# Patient Record
Sex: Female | Born: 1975
Health system: Southern US, Community
[De-identification: ages and names within clinical notes are randomized; demographics above are authoritative.]

## PROBLEM LIST (undated history)

## (undated) DIAGNOSIS — I739 Peripheral vascular disease, unspecified: Secondary | ICD-10-CM

## (undated) DIAGNOSIS — E119 Type 2 diabetes mellitus without complications: Secondary | ICD-10-CM

## (undated) DIAGNOSIS — F32A Depression, unspecified: Secondary | ICD-10-CM

## (undated) DIAGNOSIS — G5600 Carpal tunnel syndrome, unspecified upper limb: Secondary | ICD-10-CM

## (undated) DIAGNOSIS — D649 Anemia, unspecified: Secondary | ICD-10-CM

## (undated) DIAGNOSIS — T7840XA Allergy, unspecified, initial encounter: Secondary | ICD-10-CM

## (undated) DIAGNOSIS — Z8489 Family history of other specified conditions: Secondary | ICD-10-CM

## (undated) DIAGNOSIS — F329 Major depressive disorder, single episode, unspecified: Secondary | ICD-10-CM

## (undated) DIAGNOSIS — M1990 Unspecified osteoarthritis, unspecified site: Secondary | ICD-10-CM

## (undated) DIAGNOSIS — F319 Bipolar disorder, unspecified: Secondary | ICD-10-CM

## (undated) DIAGNOSIS — R911 Solitary pulmonary nodule: Secondary | ICD-10-CM

## (undated) DIAGNOSIS — G4730 Sleep apnea, unspecified: Secondary | ICD-10-CM

## (undated) DIAGNOSIS — R5383 Other fatigue: Secondary | ICD-10-CM

## (undated) HISTORY — DX: Major depressive disorder, single episode, unspecified: F32.9

## (undated) HISTORY — PX: COLONOSCOPY: SHX174

## (undated) HISTORY — DX: Other fatigue: R53.83

## (undated) HISTORY — DX: Unspecified osteoarthritis, unspecified site: M19.90

## (undated) HISTORY — DX: Depression, unspecified: F32.A

## (undated) HISTORY — PX: TUBAL LIGATION: SHX77

## (undated) HISTORY — DX: Solitary pulmonary nodule: R91.1

## (undated) HISTORY — DX: Bipolar disorder, unspecified: F31.9

## (undated) HISTORY — PX: ABDOMINAL HYSTERECTOMY: SHX81

## (undated) HISTORY — DX: Carpal tunnel syndrome, unspecified upper limb: G56.00

## (undated) HISTORY — DX: Allergy, unspecified, initial encounter: T78.40XA

---

## 1997-08-03 ENCOUNTER — Encounter: Admission: RE | Admit: 1997-08-03 | Discharge: 1997-08-03 | Payer: Self-pay | Admitting: Family Medicine

## 1997-10-19 ENCOUNTER — Other Ambulatory Visit: Admission: RE | Admit: 1997-10-19 | Discharge: 1997-10-19 | Payer: Self-pay | Admitting: Obstetrics and Gynecology

## 1997-10-20 ENCOUNTER — Ambulatory Visit (HOSPITAL_COMMUNITY): Admission: RE | Admit: 1997-10-20 | Discharge: 1997-10-20 | Payer: Self-pay | Admitting: Obstetrics and Gynecology

## 1997-10-22 ENCOUNTER — Ambulatory Visit (HOSPITAL_COMMUNITY): Admission: RE | Admit: 1997-10-22 | Discharge: 1997-10-22 | Payer: Self-pay | Admitting: Obstetrics and Gynecology

## 1997-11-11 ENCOUNTER — Inpatient Hospital Stay (HOSPITAL_COMMUNITY): Admission: AD | Admit: 1997-11-11 | Discharge: 1997-11-11 | Payer: Self-pay | Admitting: Obstetrics & Gynecology

## 1998-05-18 ENCOUNTER — Inpatient Hospital Stay (HOSPITAL_COMMUNITY): Admission: AD | Admit: 1998-05-18 | Discharge: 1998-05-18 | Payer: Self-pay | Admitting: Obstetrics and Gynecology

## 1998-05-28 ENCOUNTER — Inpatient Hospital Stay (HOSPITAL_COMMUNITY): Admission: AD | Admit: 1998-05-28 | Discharge: 1998-05-28 | Payer: Self-pay | Admitting: Obstetrics and Gynecology

## 1998-05-28 ENCOUNTER — Inpatient Hospital Stay (HOSPITAL_COMMUNITY): Admission: AD | Admit: 1998-05-28 | Discharge: 1998-05-28 | Payer: Self-pay | Admitting: Obstetrics & Gynecology

## 1998-05-29 ENCOUNTER — Inpatient Hospital Stay (HOSPITAL_COMMUNITY): Admission: AD | Admit: 1998-05-29 | Discharge: 1998-05-29 | Payer: Self-pay | Admitting: Obstetrics and Gynecology

## 1998-06-03 ENCOUNTER — Inpatient Hospital Stay (HOSPITAL_COMMUNITY): Admission: AD | Admit: 1998-06-03 | Discharge: 1998-06-05 | Payer: Self-pay | Admitting: Obstetrics & Gynecology

## 1998-10-11 ENCOUNTER — Encounter: Admission: RE | Admit: 1998-10-11 | Discharge: 1998-10-11 | Payer: Self-pay | Admitting: Family Medicine

## 1998-10-29 ENCOUNTER — Encounter: Admission: RE | Admit: 1998-10-29 | Discharge: 1998-10-29 | Payer: Self-pay | Admitting: Family Medicine

## 1998-11-16 ENCOUNTER — Encounter: Admission: RE | Admit: 1998-11-16 | Discharge: 1998-11-16 | Payer: Self-pay | Admitting: Family Medicine

## 1998-12-30 ENCOUNTER — Encounter: Admission: RE | Admit: 1998-12-30 | Discharge: 1998-12-30 | Payer: Self-pay | Admitting: Family Medicine

## 1999-01-04 ENCOUNTER — Encounter: Admission: RE | Admit: 1999-01-04 | Discharge: 1999-01-04 | Payer: Self-pay | Admitting: Sports Medicine

## 1999-01-10 ENCOUNTER — Encounter: Admission: RE | Admit: 1999-01-10 | Discharge: 1999-01-10 | Payer: Self-pay | Admitting: Sports Medicine

## 1999-01-10 ENCOUNTER — Encounter: Payer: Self-pay | Admitting: Sports Medicine

## 1999-01-21 ENCOUNTER — Encounter: Admission: RE | Admit: 1999-01-21 | Discharge: 1999-01-21 | Payer: Self-pay | Admitting: Family Medicine

## 1999-02-21 ENCOUNTER — Encounter: Admission: RE | Admit: 1999-02-21 | Discharge: 1999-02-21 | Payer: Self-pay | Admitting: Sports Medicine

## 1999-03-14 ENCOUNTER — Emergency Department (HOSPITAL_COMMUNITY): Admission: EM | Admit: 1999-03-14 | Discharge: 1999-03-14 | Payer: Self-pay | Admitting: Emergency Medicine

## 1999-04-29 ENCOUNTER — Encounter: Admission: RE | Admit: 1999-04-29 | Discharge: 1999-04-29 | Payer: Self-pay | Admitting: Family Medicine

## 1999-05-13 ENCOUNTER — Encounter: Admission: RE | Admit: 1999-05-13 | Discharge: 1999-05-13 | Payer: Self-pay | Admitting: Family Medicine

## 1999-06-13 ENCOUNTER — Emergency Department (HOSPITAL_COMMUNITY): Admission: EM | Admit: 1999-06-13 | Discharge: 1999-06-13 | Payer: Self-pay | Admitting: *Deleted

## 1999-06-14 ENCOUNTER — Encounter: Admission: RE | Admit: 1999-06-14 | Discharge: 1999-06-14 | Payer: Self-pay | Admitting: Family Medicine

## 1999-06-15 ENCOUNTER — Encounter: Payer: Self-pay | Admitting: Emergency Medicine

## 1999-06-15 ENCOUNTER — Inpatient Hospital Stay (HOSPITAL_COMMUNITY): Admission: EM | Admit: 1999-06-15 | Discharge: 1999-06-17 | Payer: Self-pay | Admitting: Emergency Medicine

## 1999-06-15 ENCOUNTER — Encounter: Payer: Self-pay | Admitting: Gastroenterology

## 1999-06-20 ENCOUNTER — Encounter: Admission: RE | Admit: 1999-06-20 | Discharge: 1999-06-20 | Payer: Self-pay | Admitting: Pediatrics

## 1999-07-18 ENCOUNTER — Encounter: Admission: RE | Admit: 1999-07-18 | Discharge: 1999-07-18 | Payer: Self-pay | Admitting: Family Medicine

## 1999-07-20 ENCOUNTER — Ambulatory Visit (HOSPITAL_COMMUNITY): Admission: RE | Admit: 1999-07-20 | Discharge: 1999-07-20 | Payer: Self-pay | Admitting: *Deleted

## 1999-07-22 ENCOUNTER — Encounter: Admission: RE | Admit: 1999-07-22 | Discharge: 1999-07-22 | Payer: Self-pay | Admitting: Family Medicine

## 1999-08-05 ENCOUNTER — Encounter: Payer: Self-pay | Admitting: Obstetrics and Gynecology

## 1999-08-05 ENCOUNTER — Ambulatory Visit (HOSPITAL_COMMUNITY): Admission: RE | Admit: 1999-08-05 | Discharge: 1999-08-05 | Payer: Self-pay | Admitting: Obstetrics and Gynecology

## 1999-08-26 ENCOUNTER — Inpatient Hospital Stay (HOSPITAL_COMMUNITY): Admission: AD | Admit: 1999-08-26 | Discharge: 1999-08-26 | Payer: Self-pay | Admitting: Obstetrics & Gynecology

## 1999-10-20 ENCOUNTER — Ambulatory Visit (HOSPITAL_COMMUNITY): Admission: RE | Admit: 1999-10-20 | Discharge: 1999-10-20 | Payer: Self-pay | Admitting: Obstetrics & Gynecology

## 1999-10-20 ENCOUNTER — Encounter: Payer: Self-pay | Admitting: Obstetrics & Gynecology

## 1999-11-09 ENCOUNTER — Emergency Department (HOSPITAL_COMMUNITY): Admission: EM | Admit: 1999-11-09 | Discharge: 1999-11-09 | Payer: Self-pay | Admitting: Emergency Medicine

## 1999-11-11 ENCOUNTER — Inpatient Hospital Stay (HOSPITAL_COMMUNITY): Admission: AD | Admit: 1999-11-11 | Discharge: 1999-11-11 | Payer: Self-pay | Admitting: Obstetrics & Gynecology

## 1999-12-22 ENCOUNTER — Inpatient Hospital Stay (HOSPITAL_COMMUNITY): Admission: AD | Admit: 1999-12-22 | Discharge: 1999-12-22 | Payer: Self-pay | Admitting: Obstetrics & Gynecology

## 1999-12-29 ENCOUNTER — Inpatient Hospital Stay (HOSPITAL_COMMUNITY): Admission: AD | Admit: 1999-12-29 | Discharge: 1999-12-29 | Payer: Self-pay | Admitting: *Deleted

## 2000-01-11 ENCOUNTER — Ambulatory Visit (HOSPITAL_COMMUNITY): Admission: RE | Admit: 2000-01-11 | Discharge: 2000-01-11 | Payer: Self-pay | Admitting: Obstetrics & Gynecology

## 2000-01-11 ENCOUNTER — Encounter: Payer: Self-pay | Admitting: Obstetrics & Gynecology

## 2000-01-29 ENCOUNTER — Inpatient Hospital Stay (HOSPITAL_COMMUNITY): Admission: AD | Admit: 2000-01-29 | Discharge: 2000-01-29 | Payer: Self-pay | Admitting: *Deleted

## 2000-01-30 ENCOUNTER — Inpatient Hospital Stay (HOSPITAL_COMMUNITY): Admission: AD | Admit: 2000-01-30 | Discharge: 2000-01-30 | Payer: Self-pay | Admitting: Obstetrics & Gynecology

## 2000-03-05 ENCOUNTER — Ambulatory Visit (HOSPITAL_COMMUNITY): Admission: RE | Admit: 2000-03-05 | Discharge: 2000-03-05 | Payer: Self-pay | Admitting: Obstetrics and Gynecology

## 2000-03-05 ENCOUNTER — Encounter: Payer: Self-pay | Admitting: Obstetrics and Gynecology

## 2000-03-17 ENCOUNTER — Inpatient Hospital Stay (HOSPITAL_COMMUNITY): Admission: AD | Admit: 2000-03-17 | Discharge: 2000-03-20 | Payer: Self-pay | Admitting: Obstetrics and Gynecology

## 2000-03-17 ENCOUNTER — Encounter (INDEPENDENT_AMBULATORY_CARE_PROVIDER_SITE_OTHER): Payer: Self-pay | Admitting: Specialist

## 2000-08-22 ENCOUNTER — Other Ambulatory Visit: Admission: RE | Admit: 2000-08-22 | Discharge: 2000-08-22 | Payer: Self-pay | Admitting: Obstetrics and Gynecology

## 2000-08-22 ENCOUNTER — Encounter: Admission: RE | Admit: 2000-08-22 | Discharge: 2000-08-22 | Payer: Self-pay | Admitting: Family Medicine

## 2000-09-20 ENCOUNTER — Encounter: Admission: RE | Admit: 2000-09-20 | Discharge: 2000-09-20 | Payer: Self-pay | Admitting: Family Medicine

## 2000-12-05 ENCOUNTER — Encounter: Admission: RE | Admit: 2000-12-05 | Discharge: 2000-12-05 | Payer: Self-pay | Admitting: Family Medicine

## 2001-02-28 ENCOUNTER — Encounter: Admission: RE | Admit: 2001-02-28 | Discharge: 2001-02-28 | Payer: Self-pay | Admitting: Family Medicine

## 2001-05-07 ENCOUNTER — Encounter: Admission: RE | Admit: 2001-05-07 | Discharge: 2001-05-07 | Payer: Self-pay | Admitting: Sports Medicine

## 2001-07-26 ENCOUNTER — Encounter: Admission: RE | Admit: 2001-07-26 | Discharge: 2001-07-26 | Payer: Self-pay | Admitting: Family Medicine

## 2001-08-12 ENCOUNTER — Other Ambulatory Visit: Admission: RE | Admit: 2001-08-12 | Discharge: 2001-08-12 | Payer: Self-pay | Admitting: Obstetrics and Gynecology

## 2001-08-15 ENCOUNTER — Encounter: Admission: RE | Admit: 2001-08-15 | Discharge: 2001-08-15 | Payer: Self-pay | Admitting: Family Medicine

## 2001-12-25 ENCOUNTER — Encounter: Admission: RE | Admit: 2001-12-25 | Discharge: 2001-12-25 | Payer: Self-pay | Admitting: Sports Medicine

## 2002-02-24 ENCOUNTER — Encounter: Admission: RE | Admit: 2002-02-24 | Discharge: 2002-02-24 | Payer: Self-pay | Admitting: Family Medicine

## 2002-03-12 ENCOUNTER — Encounter: Admission: RE | Admit: 2002-03-12 | Discharge: 2002-03-12 | Payer: Self-pay | Admitting: Family Medicine

## 2002-04-09 ENCOUNTER — Encounter: Admission: RE | Admit: 2002-04-09 | Discharge: 2002-04-09 | Payer: Self-pay | Admitting: Family Medicine

## 2002-04-15 ENCOUNTER — Ambulatory Visit (HOSPITAL_COMMUNITY): Admission: RE | Admit: 2002-04-15 | Discharge: 2002-04-15 | Payer: Self-pay | Admitting: Family Medicine

## 2002-04-15 ENCOUNTER — Encounter: Admission: RE | Admit: 2002-04-15 | Discharge: 2002-04-15 | Payer: Self-pay | Admitting: Sports Medicine

## 2002-04-15 ENCOUNTER — Encounter: Payer: Self-pay | Admitting: Sports Medicine

## 2002-04-15 ENCOUNTER — Encounter: Admission: RE | Admit: 2002-04-15 | Discharge: 2002-04-15 | Payer: Self-pay | Admitting: Family Medicine

## 2002-04-18 ENCOUNTER — Encounter: Admission: RE | Admit: 2002-04-18 | Discharge: 2002-04-18 | Payer: Self-pay | Admitting: Family Medicine

## 2002-04-30 ENCOUNTER — Encounter: Admission: RE | Admit: 2002-04-30 | Discharge: 2002-04-30 | Payer: Self-pay | Admitting: Family Medicine

## 2002-05-09 ENCOUNTER — Encounter: Admission: RE | Admit: 2002-05-09 | Discharge: 2002-05-09 | Payer: Self-pay | Admitting: Family Medicine

## 2002-05-12 ENCOUNTER — Encounter: Admission: RE | Admit: 2002-05-12 | Discharge: 2002-05-12 | Payer: Self-pay | Admitting: Family Medicine

## 2002-05-23 ENCOUNTER — Encounter: Admission: RE | Admit: 2002-05-23 | Discharge: 2002-05-23 | Payer: Self-pay | Admitting: Family Medicine

## 2002-06-26 ENCOUNTER — Encounter: Admission: RE | Admit: 2002-06-26 | Discharge: 2002-06-26 | Payer: Self-pay | Admitting: Family Medicine

## 2002-07-15 ENCOUNTER — Encounter: Admission: RE | Admit: 2002-07-15 | Discharge: 2002-07-15 | Payer: Self-pay | Admitting: Family Medicine

## 2002-08-14 ENCOUNTER — Encounter: Admission: RE | Admit: 2002-08-14 | Discharge: 2002-08-14 | Payer: Self-pay | Admitting: Family Medicine

## 2002-08-28 ENCOUNTER — Encounter: Admission: RE | Admit: 2002-08-28 | Discharge: 2002-08-28 | Payer: Self-pay | Admitting: Family Medicine

## 2002-09-17 ENCOUNTER — Encounter: Admission: RE | Admit: 2002-09-17 | Discharge: 2002-09-17 | Payer: Self-pay | Admitting: Family Medicine

## 2002-11-07 ENCOUNTER — Encounter: Admission: RE | Admit: 2002-11-07 | Discharge: 2002-11-07 | Payer: Self-pay | Admitting: Family Medicine

## 2002-12-24 ENCOUNTER — Encounter: Admission: RE | Admit: 2002-12-24 | Discharge: 2002-12-24 | Payer: Self-pay | Admitting: Family Medicine

## 2003-01-06 ENCOUNTER — Encounter: Admission: RE | Admit: 2003-01-06 | Discharge: 2003-01-06 | Payer: Self-pay | Admitting: Sports Medicine

## 2003-02-23 ENCOUNTER — Encounter: Admission: RE | Admit: 2003-02-23 | Discharge: 2003-02-23 | Payer: Self-pay | Admitting: Family Medicine

## 2003-03-12 ENCOUNTER — Encounter: Admission: RE | Admit: 2003-03-12 | Discharge: 2003-03-12 | Payer: Self-pay | Admitting: Family Medicine

## 2003-04-27 ENCOUNTER — Encounter: Admission: RE | Admit: 2003-04-27 | Discharge: 2003-04-27 | Payer: Self-pay | Admitting: Family Medicine

## 2003-05-11 ENCOUNTER — Other Ambulatory Visit: Admission: RE | Admit: 2003-05-11 | Discharge: 2003-05-11 | Payer: Self-pay | Admitting: Obstetrics and Gynecology

## 2003-05-18 ENCOUNTER — Encounter: Admission: RE | Admit: 2003-05-18 | Discharge: 2003-05-18 | Payer: Self-pay | Admitting: Sports Medicine

## 2003-07-29 ENCOUNTER — Encounter: Admission: RE | Admit: 2003-07-29 | Discharge: 2003-07-29 | Payer: Self-pay | Admitting: Family Medicine

## 2003-07-29 ENCOUNTER — Ambulatory Visit (HOSPITAL_COMMUNITY): Admission: RE | Admit: 2003-07-29 | Discharge: 2003-07-29 | Payer: Self-pay | Admitting: Family Medicine

## 2003-09-03 ENCOUNTER — Emergency Department (HOSPITAL_COMMUNITY): Admission: EM | Admit: 2003-09-03 | Discharge: 2003-09-03 | Payer: Self-pay | Admitting: Emergency Medicine

## 2003-09-08 ENCOUNTER — Encounter: Admission: RE | Admit: 2003-09-08 | Discharge: 2003-09-08 | Payer: Self-pay | Admitting: Sports Medicine

## 2003-09-24 ENCOUNTER — Encounter: Admission: RE | Admit: 2003-09-24 | Discharge: 2003-09-24 | Payer: Self-pay | Admitting: Family Medicine

## 2003-10-08 ENCOUNTER — Encounter: Admission: RE | Admit: 2003-10-08 | Discharge: 2003-10-08 | Payer: Self-pay | Admitting: Family Medicine

## 2003-10-09 ENCOUNTER — Encounter: Admission: RE | Admit: 2003-10-09 | Discharge: 2003-10-09 | Payer: Self-pay | Admitting: Family Medicine

## 2003-10-14 ENCOUNTER — Encounter: Admission: RE | Admit: 2003-10-14 | Discharge: 2003-10-14 | Payer: Self-pay | Admitting: Family Medicine

## 2003-11-09 ENCOUNTER — Encounter (INDEPENDENT_AMBULATORY_CARE_PROVIDER_SITE_OTHER): Payer: Self-pay | Admitting: *Deleted

## 2003-11-09 LAB — CONVERTED CEMR LAB

## 2003-11-17 ENCOUNTER — Ambulatory Visit: Payer: Self-pay | Admitting: Family Medicine

## 2003-11-26 ENCOUNTER — Encounter: Admission: RE | Admit: 2003-11-26 | Discharge: 2004-01-18 | Payer: Self-pay | Admitting: Family Medicine

## 2003-12-31 ENCOUNTER — Ambulatory Visit: Payer: Self-pay | Admitting: Family Medicine

## 2004-01-04 ENCOUNTER — Ambulatory Visit (HOSPITAL_BASED_OUTPATIENT_CLINIC_OR_DEPARTMENT_OTHER): Admission: RE | Admit: 2004-01-04 | Discharge: 2004-01-04 | Payer: Self-pay | Admitting: Family Medicine

## 2004-01-11 ENCOUNTER — Ambulatory Visit: Payer: Self-pay | Admitting: Sports Medicine

## 2004-02-11 ENCOUNTER — Ambulatory Visit: Payer: Self-pay | Admitting: Family Medicine

## 2004-03-01 ENCOUNTER — Ambulatory Visit: Payer: Self-pay | Admitting: Family Medicine

## 2004-03-10 ENCOUNTER — Emergency Department (HOSPITAL_COMMUNITY): Admission: EM | Admit: 2004-03-10 | Discharge: 2004-03-10 | Payer: Self-pay | Admitting: Emergency Medicine

## 2004-03-10 ENCOUNTER — Emergency Department (HOSPITAL_COMMUNITY): Admission: EM | Admit: 2004-03-10 | Discharge: 2004-03-11 | Payer: Self-pay | Admitting: Emergency Medicine

## 2004-03-12 ENCOUNTER — Inpatient Hospital Stay (HOSPITAL_COMMUNITY): Admission: AD | Admit: 2004-03-12 | Discharge: 2004-03-15 | Payer: Self-pay | Admitting: Obstetrics and Gynecology

## 2004-07-01 ENCOUNTER — Ambulatory Visit: Payer: Self-pay | Admitting: Family Medicine

## 2004-07-03 ENCOUNTER — Emergency Department (HOSPITAL_COMMUNITY): Admission: EM | Admit: 2004-07-03 | Discharge: 2004-07-03 | Payer: Self-pay | Admitting: Family Medicine

## 2004-07-04 ENCOUNTER — Ambulatory Visit: Payer: Self-pay | Admitting: Family Medicine

## 2004-08-25 ENCOUNTER — Ambulatory Visit: Payer: Self-pay | Admitting: Family Medicine

## 2004-09-19 ENCOUNTER — Ambulatory Visit: Payer: Self-pay | Admitting: Family Medicine

## 2004-09-29 ENCOUNTER — Ambulatory Visit: Payer: Self-pay | Admitting: Family Medicine

## 2004-09-30 ENCOUNTER — Encounter: Admission: RE | Admit: 2004-09-30 | Discharge: 2004-09-30 | Payer: Self-pay | Admitting: Sports Medicine

## 2004-10-06 ENCOUNTER — Ambulatory Visit: Payer: Self-pay | Admitting: Psychology

## 2004-10-07 ENCOUNTER — Ambulatory Visit: Payer: Self-pay | Admitting: Family Medicine

## 2004-10-10 ENCOUNTER — Encounter: Admission: RE | Admit: 2004-10-10 | Discharge: 2005-01-08 | Payer: Self-pay | Admitting: Sports Medicine

## 2004-10-13 ENCOUNTER — Ambulatory Visit: Payer: Self-pay | Admitting: Psychology

## 2004-10-19 ENCOUNTER — Ambulatory Visit: Payer: Self-pay | Admitting: Family Medicine

## 2004-10-21 ENCOUNTER — Ambulatory Visit: Payer: Self-pay | Admitting: Family Medicine

## 2004-10-24 ENCOUNTER — Ambulatory Visit: Payer: Self-pay | Admitting: Psychology

## 2004-11-02 ENCOUNTER — Ambulatory Visit: Payer: Self-pay | Admitting: Family Medicine

## 2004-11-09 ENCOUNTER — Ambulatory Visit: Payer: Self-pay | Admitting: Psychology

## 2004-11-24 ENCOUNTER — Ambulatory Visit: Payer: Self-pay | Admitting: Family Medicine

## 2004-11-28 ENCOUNTER — Ambulatory Visit: Payer: Self-pay | Admitting: Family Medicine

## 2004-11-30 ENCOUNTER — Ambulatory Visit: Payer: Self-pay | Admitting: Family Medicine

## 2004-12-01 ENCOUNTER — Ambulatory Visit: Payer: Self-pay | Admitting: Psychology

## 2004-12-08 ENCOUNTER — Ambulatory Visit: Payer: Self-pay | Admitting: Psychology

## 2004-12-12 ENCOUNTER — Ambulatory Visit: Payer: Self-pay | Admitting: Family Medicine

## 2004-12-12 ENCOUNTER — Emergency Department (HOSPITAL_COMMUNITY): Admission: EM | Admit: 2004-12-12 | Discharge: 2004-12-12 | Payer: Self-pay | Admitting: Emergency Medicine

## 2004-12-20 ENCOUNTER — Ambulatory Visit: Payer: Self-pay | Admitting: Family Medicine

## 2004-12-27 ENCOUNTER — Ambulatory Visit: Payer: Self-pay | Admitting: Psychology

## 2004-12-28 ENCOUNTER — Ambulatory Visit: Payer: Self-pay | Admitting: Sports Medicine

## 2005-01-04 ENCOUNTER — Ambulatory Visit: Payer: Self-pay | Admitting: Family Medicine

## 2005-01-20 ENCOUNTER — Ambulatory Visit: Payer: Self-pay | Admitting: Family Medicine

## 2005-01-25 ENCOUNTER — Ambulatory Visit: Payer: Self-pay | Admitting: Family Medicine

## 2005-03-15 ENCOUNTER — Ambulatory Visit: Payer: Self-pay | Admitting: Family Medicine

## 2005-03-21 ENCOUNTER — Emergency Department (HOSPITAL_COMMUNITY): Admission: EM | Admit: 2005-03-21 | Discharge: 2005-03-21 | Payer: Self-pay | Admitting: Emergency Medicine

## 2005-04-10 ENCOUNTER — Ambulatory Visit: Payer: Self-pay | Admitting: Family Medicine

## 2005-05-02 ENCOUNTER — Emergency Department (HOSPITAL_COMMUNITY): Admission: EM | Admit: 2005-05-02 | Discharge: 2005-05-02 | Payer: Self-pay | Admitting: Emergency Medicine

## 2005-05-03 ENCOUNTER — Ambulatory Visit: Payer: Self-pay | Admitting: Family Medicine

## 2005-06-04 ENCOUNTER — Emergency Department (HOSPITAL_COMMUNITY): Admission: EM | Admit: 2005-06-04 | Discharge: 2005-06-04 | Payer: Self-pay | Admitting: Family Medicine

## 2005-06-06 ENCOUNTER — Ambulatory Visit: Payer: Self-pay | Admitting: Family Medicine

## 2005-06-28 ENCOUNTER — Ambulatory Visit: Payer: Self-pay | Admitting: Family Medicine

## 2005-07-25 ENCOUNTER — Ambulatory Visit: Payer: Self-pay | Admitting: Sports Medicine

## 2005-08-02 ENCOUNTER — Ambulatory Visit: Payer: Self-pay | Admitting: Family Medicine

## 2005-08-03 ENCOUNTER — Ambulatory Visit: Payer: Self-pay | Admitting: Family Medicine

## 2005-08-19 ENCOUNTER — Emergency Department (HOSPITAL_COMMUNITY): Admission: EM | Admit: 2005-08-19 | Discharge: 2005-08-19 | Payer: Self-pay | Admitting: Emergency Medicine

## 2005-08-28 ENCOUNTER — Ambulatory Visit: Payer: Self-pay | Admitting: Family Medicine

## 2005-08-30 ENCOUNTER — Ambulatory Visit: Payer: Self-pay | Admitting: Psychology

## 2005-09-07 ENCOUNTER — Emergency Department (HOSPITAL_COMMUNITY): Admission: EM | Admit: 2005-09-07 | Discharge: 2005-09-07 | Payer: Self-pay | Admitting: Family Medicine

## 2005-09-13 ENCOUNTER — Ambulatory Visit: Payer: Self-pay | Admitting: Family Medicine

## 2005-10-02 ENCOUNTER — Ambulatory Visit: Payer: Self-pay | Admitting: Sports Medicine

## 2005-10-12 ENCOUNTER — Ambulatory Visit: Payer: Self-pay | Admitting: Family Medicine

## 2005-10-17 ENCOUNTER — Inpatient Hospital Stay (HOSPITAL_COMMUNITY): Admission: AD | Admit: 2005-10-17 | Discharge: 2005-10-17 | Payer: Self-pay | Admitting: Obstetrics and Gynecology

## 2005-10-18 ENCOUNTER — Ambulatory Visit: Payer: Self-pay | Admitting: Sports Medicine

## 2005-10-20 ENCOUNTER — Ambulatory Visit: Payer: Self-pay | Admitting: Family Medicine

## 2005-11-15 ENCOUNTER — Ambulatory Visit: Payer: Self-pay | Admitting: Sports Medicine

## 2005-11-27 ENCOUNTER — Ambulatory Visit: Payer: Self-pay | Admitting: Family Medicine

## 2005-12-12 ENCOUNTER — Ambulatory Visit: Payer: Self-pay | Admitting: Family Medicine

## 2005-12-27 ENCOUNTER — Ambulatory Visit: Payer: Self-pay | Admitting: Family Medicine

## 2006-01-11 ENCOUNTER — Ambulatory Visit: Payer: Self-pay | Admitting: Sports Medicine

## 2006-01-31 ENCOUNTER — Ambulatory Visit: Payer: Self-pay | Admitting: Family Medicine

## 2006-02-21 ENCOUNTER — Ambulatory Visit: Payer: Self-pay | Admitting: Family Medicine

## 2006-03-23 ENCOUNTER — Ambulatory Visit: Payer: Self-pay | Admitting: Family Medicine

## 2006-03-23 ENCOUNTER — Encounter (INDEPENDENT_AMBULATORY_CARE_PROVIDER_SITE_OTHER): Payer: Self-pay | Admitting: *Deleted

## 2006-03-23 LAB — CONVERTED CEMR LAB
Alkaline Phosphatase: 63 units/L (ref 39–117)
BUN: 8 mg/dL (ref 6–23)
Creatinine, Ser: 0.64 mg/dL (ref 0.40–1.20)
Glucose, Bld: 115 mg/dL — ABNORMAL HIGH (ref 70–99)
Total Bilirubin: 0.2 mg/dL — ABNORMAL LOW (ref 0.3–1.2)

## 2006-03-27 ENCOUNTER — Ambulatory Visit: Payer: Self-pay | Admitting: Family Medicine

## 2006-04-04 ENCOUNTER — Ambulatory Visit: Payer: Self-pay | Admitting: Family Medicine

## 2006-04-12 ENCOUNTER — Ambulatory Visit: Payer: Self-pay | Admitting: Family Medicine

## 2006-04-12 ENCOUNTER — Encounter (INDEPENDENT_AMBULATORY_CARE_PROVIDER_SITE_OTHER): Payer: Self-pay | Admitting: Family Medicine

## 2006-04-12 LAB — CONVERTED CEMR LAB
Chlamydia, DNA Probe: NEGATIVE
GC Probe Amp, Genital: NEGATIVE

## 2006-05-03 DIAGNOSIS — K279 Peptic ulcer, site unspecified, unspecified as acute or chronic, without hemorrhage or perforation: Secondary | ICD-10-CM | POA: Insufficient documentation

## 2006-05-03 DIAGNOSIS — G5600 Carpal tunnel syndrome, unspecified upper limb: Secondary | ICD-10-CM | POA: Insufficient documentation

## 2006-05-03 DIAGNOSIS — D509 Iron deficiency anemia, unspecified: Secondary | ICD-10-CM | POA: Insufficient documentation

## 2006-05-03 DIAGNOSIS — I10 Essential (primary) hypertension: Secondary | ICD-10-CM | POA: Insufficient documentation

## 2006-05-03 DIAGNOSIS — G43909 Migraine, unspecified, not intractable, without status migrainosus: Secondary | ICD-10-CM | POA: Insufficient documentation

## 2006-05-03 DIAGNOSIS — E669 Obesity, unspecified: Secondary | ICD-10-CM | POA: Insufficient documentation

## 2006-05-03 DIAGNOSIS — F319 Bipolar disorder, unspecified: Secondary | ICD-10-CM | POA: Insufficient documentation

## 2006-05-03 DIAGNOSIS — F411 Generalized anxiety disorder: Secondary | ICD-10-CM | POA: Insufficient documentation

## 2006-05-03 DIAGNOSIS — E66813 Obesity, class 3: Secondary | ICD-10-CM | POA: Insufficient documentation

## 2006-05-03 DIAGNOSIS — J309 Allergic rhinitis, unspecified: Secondary | ICD-10-CM | POA: Insufficient documentation

## 2006-05-03 DIAGNOSIS — E785 Hyperlipidemia, unspecified: Secondary | ICD-10-CM | POA: Insufficient documentation

## 2006-05-04 ENCOUNTER — Encounter (INDEPENDENT_AMBULATORY_CARE_PROVIDER_SITE_OTHER): Payer: Self-pay | Admitting: *Deleted

## 2006-05-14 ENCOUNTER — Telehealth: Payer: Self-pay | Admitting: Psychology

## 2006-06-13 ENCOUNTER — Ambulatory Visit: Payer: Self-pay | Admitting: Family Medicine

## 2006-06-14 ENCOUNTER — Telehealth: Payer: Self-pay | Admitting: Psychology

## 2006-07-09 ENCOUNTER — Telehealth (INDEPENDENT_AMBULATORY_CARE_PROVIDER_SITE_OTHER): Payer: Self-pay | Admitting: *Deleted

## 2006-07-10 ENCOUNTER — Encounter: Payer: Self-pay | Admitting: Family Medicine

## 2006-07-10 ENCOUNTER — Ambulatory Visit: Payer: Self-pay | Admitting: Family Medicine

## 2006-07-10 LAB — CONVERTED CEMR LAB
GC Probe Amp, Genital: NEGATIVE
Nitrite: NEGATIVE
Protein, U semiquant: NEGATIVE
Urobilinogen, UA: 0.2
WBC Urine, dipstick: NEGATIVE

## 2006-07-11 ENCOUNTER — Ambulatory Visit: Payer: Self-pay | Admitting: Family Medicine

## 2006-07-19 ENCOUNTER — Ambulatory Visit: Payer: Self-pay | Admitting: Family Medicine

## 2006-08-09 ENCOUNTER — Ambulatory Visit: Payer: Self-pay | Admitting: Family Medicine

## 2006-09-05 ENCOUNTER — Telehealth: Payer: Self-pay | Admitting: Psychology

## 2006-09-10 ENCOUNTER — Telehealth: Payer: Self-pay | Admitting: *Deleted

## 2006-09-12 ENCOUNTER — Encounter: Payer: Self-pay | Admitting: *Deleted

## 2006-10-10 ENCOUNTER — Ambulatory Visit: Payer: Self-pay | Admitting: Family Medicine

## 2006-10-17 ENCOUNTER — Inpatient Hospital Stay (HOSPITAL_COMMUNITY): Admission: AD | Admit: 2006-10-17 | Discharge: 2006-10-17 | Payer: Self-pay | Admitting: Obstetrics and Gynecology

## 2006-10-17 ENCOUNTER — Telehealth: Payer: Self-pay | Admitting: *Deleted

## 2006-10-18 ENCOUNTER — Ambulatory Visit: Payer: Self-pay | Admitting: Family Medicine

## 2006-10-18 ENCOUNTER — Telehealth (INDEPENDENT_AMBULATORY_CARE_PROVIDER_SITE_OTHER): Payer: Self-pay | Admitting: *Deleted

## 2006-10-19 ENCOUNTER — Telehealth (INDEPENDENT_AMBULATORY_CARE_PROVIDER_SITE_OTHER): Payer: Self-pay | Admitting: *Deleted

## 2006-10-20 ENCOUNTER — Inpatient Hospital Stay (HOSPITAL_COMMUNITY): Admission: AD | Admit: 2006-10-20 | Discharge: 2006-10-20 | Payer: Self-pay | Admitting: Gynecology

## 2006-10-22 ENCOUNTER — Inpatient Hospital Stay (HOSPITAL_COMMUNITY): Admission: AD | Admit: 2006-10-22 | Discharge: 2006-10-22 | Payer: Self-pay | Admitting: Family Medicine

## 2006-10-25 ENCOUNTER — Ambulatory Visit: Payer: Self-pay | Admitting: Gynecology

## 2006-11-06 ENCOUNTER — Encounter (INDEPENDENT_AMBULATORY_CARE_PROVIDER_SITE_OTHER): Payer: Self-pay | Admitting: Gynecology

## 2006-11-06 ENCOUNTER — Ambulatory Visit: Payer: Self-pay | Admitting: Gynecology

## 2006-11-06 ENCOUNTER — Inpatient Hospital Stay (HOSPITAL_COMMUNITY): Admission: RE | Admit: 2006-11-06 | Discharge: 2006-11-08 | Payer: Self-pay | Admitting: Gynecology

## 2006-11-12 ENCOUNTER — Inpatient Hospital Stay (HOSPITAL_COMMUNITY): Admission: AD | Admit: 2006-11-12 | Discharge: 2006-11-12 | Payer: Self-pay | Admitting: Obstetrics & Gynecology

## 2006-11-13 ENCOUNTER — Telehealth: Payer: Self-pay | Admitting: Psychology

## 2006-11-19 ENCOUNTER — Ambulatory Visit: Payer: Self-pay | Admitting: Gynecology

## 2006-11-19 ENCOUNTER — Inpatient Hospital Stay (HOSPITAL_COMMUNITY): Admission: AD | Admit: 2006-11-19 | Discharge: 2006-11-21 | Payer: Self-pay | Admitting: Gynecology

## 2006-11-20 ENCOUNTER — Encounter: Payer: Self-pay | Admitting: Gastroenterology

## 2006-11-20 DIAGNOSIS — K449 Diaphragmatic hernia without obstruction or gangrene: Secondary | ICD-10-CM | POA: Insufficient documentation

## 2006-11-22 ENCOUNTER — Ambulatory Visit: Payer: Self-pay | Admitting: Gastroenterology

## 2006-11-28 ENCOUNTER — Ambulatory Visit: Payer: Self-pay | Admitting: Obstetrics & Gynecology

## 2006-11-28 ENCOUNTER — Encounter (INDEPENDENT_AMBULATORY_CARE_PROVIDER_SITE_OTHER): Payer: Self-pay | Admitting: Family Medicine

## 2006-12-20 ENCOUNTER — Ambulatory Visit: Payer: Self-pay | Admitting: Family Medicine

## 2006-12-21 ENCOUNTER — Ambulatory Visit: Payer: Self-pay | Admitting: Gastroenterology

## 2006-12-28 ENCOUNTER — Ambulatory Visit: Payer: Self-pay | Admitting: Obstetrics & Gynecology

## 2007-01-18 ENCOUNTER — Ambulatory Visit: Payer: Self-pay | Admitting: *Deleted

## 2007-01-22 ENCOUNTER — Telehealth: Payer: Self-pay | Admitting: *Deleted

## 2007-01-26 ENCOUNTER — Emergency Department (HOSPITAL_COMMUNITY): Admission: EM | Admit: 2007-01-26 | Discharge: 2007-01-26 | Payer: Self-pay | Admitting: Family Medicine

## 2007-01-29 ENCOUNTER — Telehealth: Payer: Self-pay | Admitting: *Deleted

## 2007-02-06 ENCOUNTER — Ambulatory Visit: Payer: Self-pay | Admitting: Family Medicine

## 2007-02-14 ENCOUNTER — Encounter (INDEPENDENT_AMBULATORY_CARE_PROVIDER_SITE_OTHER): Payer: Self-pay | Admitting: Family Medicine

## 2007-02-19 ENCOUNTER — Ambulatory Visit: Payer: Self-pay | Admitting: Sports Medicine

## 2007-02-20 ENCOUNTER — Ambulatory Visit (HOSPITAL_BASED_OUTPATIENT_CLINIC_OR_DEPARTMENT_OTHER): Admission: RE | Admit: 2007-02-20 | Discharge: 2007-02-20 | Payer: Self-pay | Admitting: Urology

## 2007-02-20 ENCOUNTER — Encounter (INDEPENDENT_AMBULATORY_CARE_PROVIDER_SITE_OTHER): Payer: Self-pay | Admitting: Urology

## 2007-03-04 ENCOUNTER — Telehealth: Payer: Self-pay | Admitting: Psychology

## 2007-03-15 ENCOUNTER — Telehealth: Payer: Self-pay | Admitting: Psychology

## 2007-04-17 ENCOUNTER — Telehealth: Payer: Self-pay | Admitting: Psychology

## 2007-04-17 ENCOUNTER — Telehealth: Payer: Self-pay | Admitting: *Deleted

## 2007-04-19 ENCOUNTER — Ambulatory Visit: Payer: Self-pay | Admitting: Obstetrics & Gynecology

## 2007-04-24 ENCOUNTER — Encounter (INDEPENDENT_AMBULATORY_CARE_PROVIDER_SITE_OTHER): Payer: Self-pay | Admitting: Family Medicine

## 2007-04-24 ENCOUNTER — Ambulatory Visit: Payer: Self-pay | Admitting: Family Medicine

## 2007-04-24 DIAGNOSIS — E8941 Symptomatic postprocedural ovarian failure: Secondary | ICD-10-CM | POA: Insufficient documentation

## 2007-04-24 LAB — CONVERTED CEMR LAB

## 2007-04-25 LAB — CONVERTED CEMR LAB
ALT: 10 units/L (ref 0–35)
Basophils Absolute: 0 10*3/uL (ref 0.0–0.1)
CO2: 23 meq/L (ref 19–32)
Calcium: 9.5 mg/dL (ref 8.4–10.5)
Chloride: 103 meq/L (ref 96–112)
Cholesterol: 196 mg/dL (ref 0–200)
Creatinine, Ser: 0.75 mg/dL (ref 0.40–1.20)
Ferritin: 8 ng/mL — ABNORMAL LOW (ref 10–291)
Free T4: 1.05 ng/dL (ref 0.89–1.80)
Hemoglobin: 11.8 g/dL — ABNORMAL LOW (ref 12.0–15.0)
Lymphocytes Relative: 46 % (ref 12–46)
Lymphs Abs: 2.6 10*3/uL (ref 0.7–4.0)
Monocytes Absolute: 0.3 10*3/uL (ref 0.1–1.0)
Neutro Abs: 2.5 10*3/uL (ref 1.7–7.7)
RBC: 4.22 M/uL (ref 3.87–5.11)
RDW: 13.8 % (ref 11.5–15.5)
Total CHOL/HDL Ratio: 2.4
Total Protein: 7 g/dL (ref 6.0–8.3)
WBC: 5.6 10*3/uL (ref 4.0–10.5)

## 2007-04-30 ENCOUNTER — Ambulatory Visit: Payer: Self-pay | Admitting: Family Medicine

## 2007-05-03 ENCOUNTER — Ambulatory Visit: Payer: Self-pay | Admitting: Gynecology

## 2007-05-08 ENCOUNTER — Ambulatory Visit: Payer: Self-pay | Admitting: Family Medicine

## 2007-05-17 ENCOUNTER — Ambulatory Visit: Payer: Self-pay | Admitting: Obstetrics & Gynecology

## 2007-05-24 ENCOUNTER — Ambulatory Visit: Payer: Self-pay | Admitting: Family Medicine

## 2007-05-29 ENCOUNTER — Telehealth: Payer: Self-pay | Admitting: *Deleted

## 2007-06-03 ENCOUNTER — Encounter (INDEPENDENT_AMBULATORY_CARE_PROVIDER_SITE_OTHER): Payer: Self-pay | Admitting: Family Medicine

## 2007-06-07 ENCOUNTER — Ambulatory Visit: Payer: Self-pay | Admitting: Obstetrics & Gynecology

## 2007-06-07 ENCOUNTER — Telehealth (INDEPENDENT_AMBULATORY_CARE_PROVIDER_SITE_OTHER): Payer: Self-pay | Admitting: Family Medicine

## 2007-06-08 ENCOUNTER — Emergency Department (HOSPITAL_COMMUNITY): Admission: EM | Admit: 2007-06-08 | Discharge: 2007-06-08 | Payer: Self-pay | Admitting: Family Medicine

## 2007-06-26 ENCOUNTER — Ambulatory Visit: Payer: Self-pay | Admitting: Family Medicine

## 2007-06-28 ENCOUNTER — Ambulatory Visit: Payer: Self-pay | Admitting: Obstetrics & Gynecology

## 2007-07-01 ENCOUNTER — Encounter (INDEPENDENT_AMBULATORY_CARE_PROVIDER_SITE_OTHER): Payer: Self-pay | Admitting: Family Medicine

## 2007-07-10 ENCOUNTER — Encounter: Payer: Self-pay | Admitting: Obstetrics & Gynecology

## 2007-07-10 ENCOUNTER — Ambulatory Visit: Payer: Self-pay | Admitting: Obstetrics & Gynecology

## 2007-07-22 ENCOUNTER — Telehealth (INDEPENDENT_AMBULATORY_CARE_PROVIDER_SITE_OTHER): Payer: Self-pay | Admitting: Family Medicine

## 2007-07-22 DIAGNOSIS — N3010 Interstitial cystitis (chronic) without hematuria: Secondary | ICD-10-CM | POA: Insufficient documentation

## 2007-07-26 ENCOUNTER — Ambulatory Visit: Payer: Self-pay | Admitting: Family Medicine

## 2007-07-26 ENCOUNTER — Encounter: Payer: Self-pay | Admitting: *Deleted

## 2007-08-29 ENCOUNTER — Ambulatory Visit: Payer: Self-pay | Admitting: Gynecology

## 2007-09-13 ENCOUNTER — Ambulatory Visit: Payer: Self-pay | Admitting: Family Medicine

## 2007-09-13 ENCOUNTER — Telehealth: Payer: Self-pay | Admitting: *Deleted

## 2007-09-13 LAB — CONVERTED CEMR LAB
Bilirubin Urine: NEGATIVE
Glucose, Urine, Semiquant: NEGATIVE
Specific Gravity, Urine: 1.02
pH: 7

## 2007-09-16 ENCOUNTER — Encounter: Payer: Self-pay | Admitting: Family Medicine

## 2007-09-16 ENCOUNTER — Ambulatory Visit: Payer: Self-pay | Admitting: Sports Medicine

## 2007-09-16 LAB — CONVERTED CEMR LAB
Calcium: 9.1 mg/dL (ref 8.4–10.5)
Sodium: 145 meq/L (ref 135–145)
Vit D, 1,25-Dihydroxy: 15 — ABNORMAL LOW (ref 30–89)

## 2007-09-17 ENCOUNTER — Telehealth: Payer: Self-pay | Admitting: *Deleted

## 2007-09-19 ENCOUNTER — Telehealth: Payer: Self-pay | Admitting: *Deleted

## 2007-09-20 ENCOUNTER — Ambulatory Visit: Payer: Self-pay | Admitting: Family Medicine

## 2007-09-20 DIAGNOSIS — E559 Vitamin D deficiency, unspecified: Secondary | ICD-10-CM | POA: Insufficient documentation

## 2007-10-17 ENCOUNTER — Telehealth: Payer: Self-pay | Admitting: *Deleted

## 2007-11-05 ENCOUNTER — Telehealth: Payer: Self-pay | Admitting: Psychology

## 2007-11-06 ENCOUNTER — Ambulatory Visit: Payer: Self-pay | Admitting: Family Medicine

## 2007-11-06 ENCOUNTER — Telehealth: Payer: Self-pay | Admitting: Psychology

## 2007-11-19 ENCOUNTER — Telehealth: Payer: Self-pay | Admitting: Psychology

## 2007-11-20 ENCOUNTER — Ambulatory Visit: Payer: Self-pay | Admitting: Family Medicine

## 2007-12-02 ENCOUNTER — Encounter: Payer: Self-pay | Admitting: *Deleted

## 2007-12-16 ENCOUNTER — Telehealth: Payer: Self-pay | Admitting: Psychology

## 2007-12-20 ENCOUNTER — Telehealth: Payer: Self-pay | Admitting: Psychology

## 2007-12-25 ENCOUNTER — Telehealth (INDEPENDENT_AMBULATORY_CARE_PROVIDER_SITE_OTHER): Payer: Self-pay | Admitting: *Deleted

## 2007-12-31 ENCOUNTER — Telehealth: Payer: Self-pay | Admitting: Psychology

## 2008-01-02 ENCOUNTER — Telehealth: Payer: Self-pay | Admitting: Family Medicine

## 2008-01-02 ENCOUNTER — Telehealth: Payer: Self-pay | Admitting: Psychology

## 2008-01-06 ENCOUNTER — Telehealth (INDEPENDENT_AMBULATORY_CARE_PROVIDER_SITE_OTHER): Payer: Self-pay | Admitting: *Deleted

## 2008-01-06 ENCOUNTER — Encounter (INDEPENDENT_AMBULATORY_CARE_PROVIDER_SITE_OTHER): Payer: Self-pay | Admitting: *Deleted

## 2008-01-08 ENCOUNTER — Ambulatory Visit: Payer: Self-pay | Admitting: Family Medicine

## 2008-01-23 ENCOUNTER — Ambulatory Visit: Payer: Self-pay | Admitting: Family Medicine

## 2008-02-26 ENCOUNTER — Encounter: Payer: Self-pay | Admitting: *Deleted

## 2008-04-21 ENCOUNTER — Telehealth: Payer: Self-pay | Admitting: Family Medicine

## 2008-04-24 ENCOUNTER — Encounter: Payer: Self-pay | Admitting: Family Medicine

## 2008-04-24 ENCOUNTER — Ambulatory Visit: Payer: Self-pay | Admitting: Family Medicine

## 2008-04-29 LAB — CONVERTED CEMR LAB: Cholesterol: 219 mg/dL — ABNORMAL HIGH (ref 0–200)

## 2008-05-01 ENCOUNTER — Telehealth: Payer: Self-pay | Admitting: Psychology

## 2008-05-06 ENCOUNTER — Telehealth: Payer: Self-pay | Admitting: Family Medicine

## 2008-05-08 ENCOUNTER — Ambulatory Visit: Payer: Self-pay | Admitting: Family Medicine

## 2008-05-08 ENCOUNTER — Telehealth: Payer: Self-pay | Admitting: Family Medicine

## 2008-05-08 LAB — CONVERTED CEMR LAB: Rapid Strep: NEGATIVE

## 2008-05-12 ENCOUNTER — Ambulatory Visit: Payer: Self-pay | Admitting: Family Medicine

## 2008-06-26 ENCOUNTER — Encounter: Payer: Self-pay | Admitting: Family Medicine

## 2008-07-30 ENCOUNTER — Telehealth: Payer: Self-pay | Admitting: Family Medicine

## 2008-08-17 ENCOUNTER — Telehealth: Payer: Self-pay | Admitting: Family Medicine

## 2008-09-02 ENCOUNTER — Telehealth: Payer: Self-pay | Admitting: Family Medicine

## 2008-09-02 ENCOUNTER — Ambulatory Visit: Payer: Self-pay | Admitting: Family Medicine

## 2008-09-04 ENCOUNTER — Telehealth: Payer: Self-pay | Admitting: Family Medicine

## 2008-09-29 ENCOUNTER — Telehealth: Payer: Self-pay | Admitting: Family Medicine

## 2008-10-07 ENCOUNTER — Ambulatory Visit: Payer: Self-pay | Admitting: Family Medicine

## 2008-11-02 ENCOUNTER — Telehealth: Payer: Self-pay | Admitting: *Deleted

## 2008-11-04 ENCOUNTER — Encounter: Payer: Self-pay | Admitting: *Deleted

## 2008-11-12 ENCOUNTER — Telehealth: Payer: Self-pay | Admitting: Family Medicine

## 2009-01-27 ENCOUNTER — Telehealth: Payer: Self-pay | Admitting: Family Medicine

## 2009-02-12 ENCOUNTER — Ambulatory Visit: Payer: Self-pay | Admitting: Family Medicine

## 2009-02-25 ENCOUNTER — Ambulatory Visit: Payer: Self-pay | Admitting: Obstetrics and Gynecology

## 2009-02-25 LAB — CONVERTED CEMR LAB
Trich, Wet Prep: NONE SEEN
Yeast Wet Prep HPF POC: NONE SEEN

## 2009-03-08 ENCOUNTER — Ambulatory Visit: Payer: Self-pay | Admitting: Family Medicine

## 2009-03-08 ENCOUNTER — Ambulatory Visit (HOSPITAL_COMMUNITY): Admission: RE | Admit: 2009-03-08 | Discharge: 2009-03-08 | Payer: Self-pay | Admitting: Family Medicine

## 2009-03-22 ENCOUNTER — Telehealth: Payer: Self-pay | Admitting: Family Medicine

## 2009-04-06 IMAGING — US US PELVIS COMPLETE MODIFY
1 series · 14 of 25 positions shown · non-contrast
Comparison: 03/11/04.

CLINICAL DATA: Lower abdominal pain.
 TRANSABDOMINAL AND TRANSVAGINAL PELVIC ULTRASOUND:
TECHNIQUE: Both transabdominal and transvaginal ultrasound examinations of the pelvis were performed including evaluation of the uterus, ovaries, adnexal regions, and pelvic cul-de-sac.

[Series 1: us pelvis complete modify · 0.30mm/px · 14 of 29 slices shown]
[im 1/29]
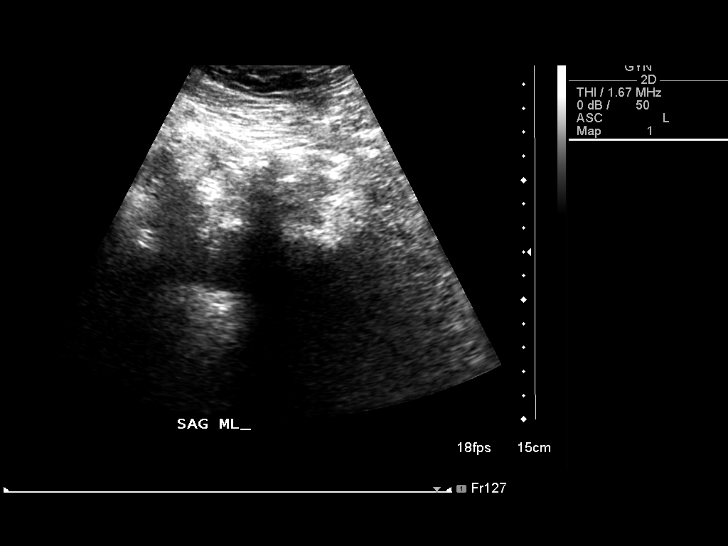
[im 3/29]
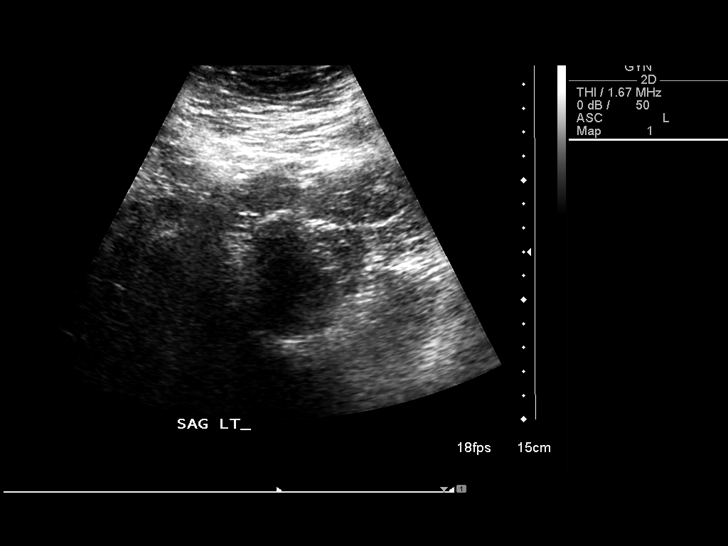
[im 5/29]
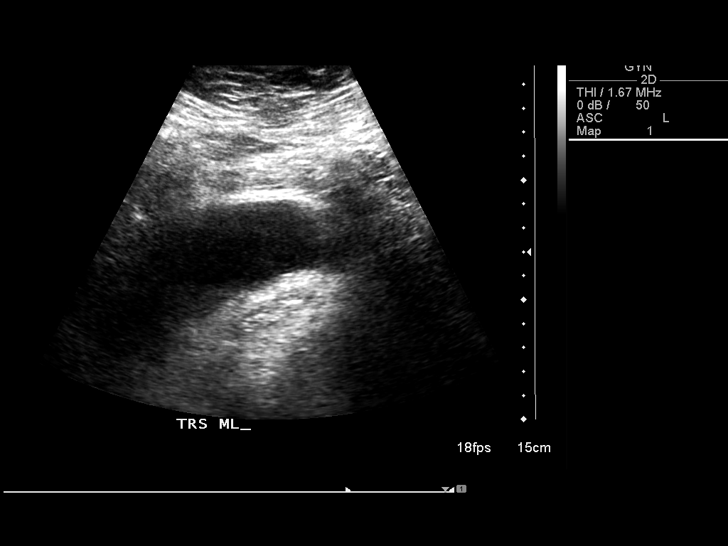
[im 8/29]
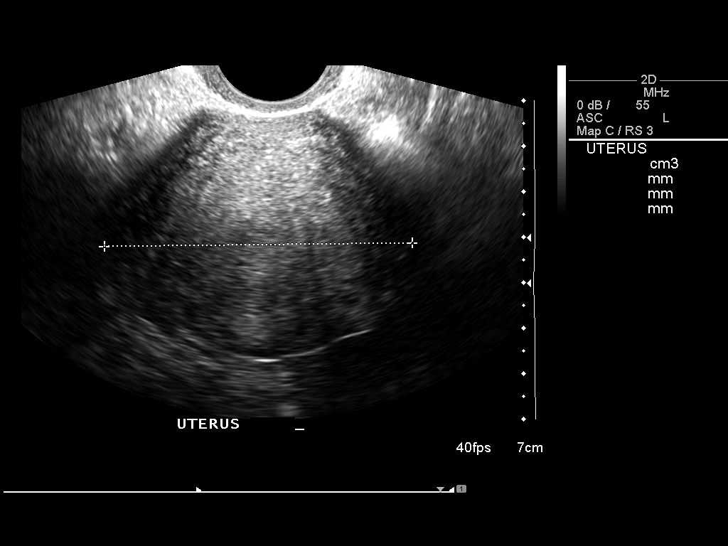
[im 10/29]
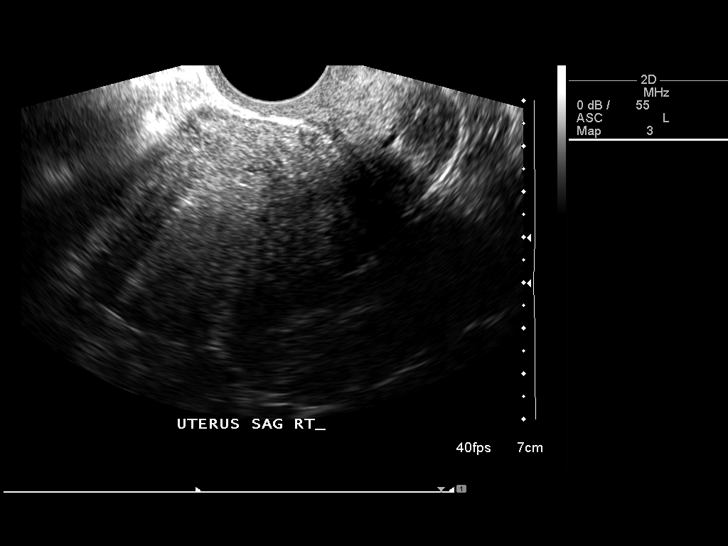
[im 11/29]
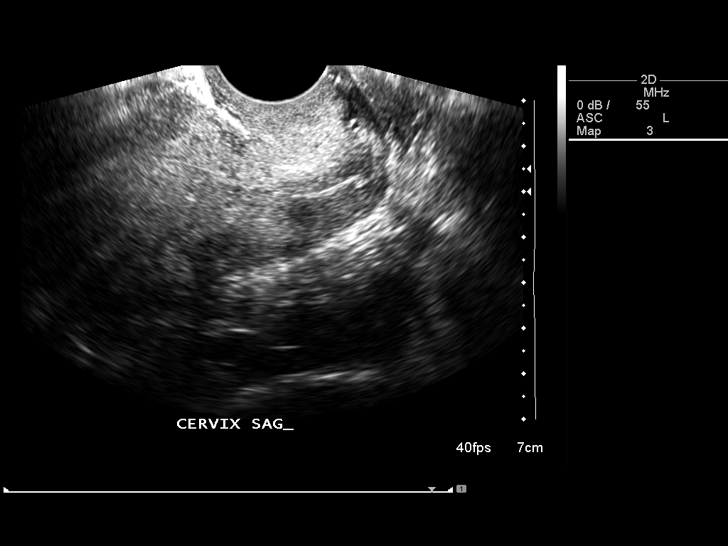
[im 13/29]
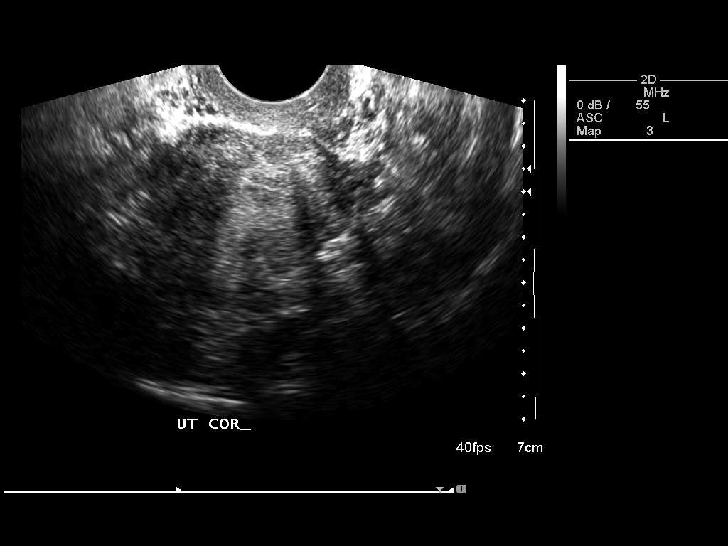
[im 16/29]
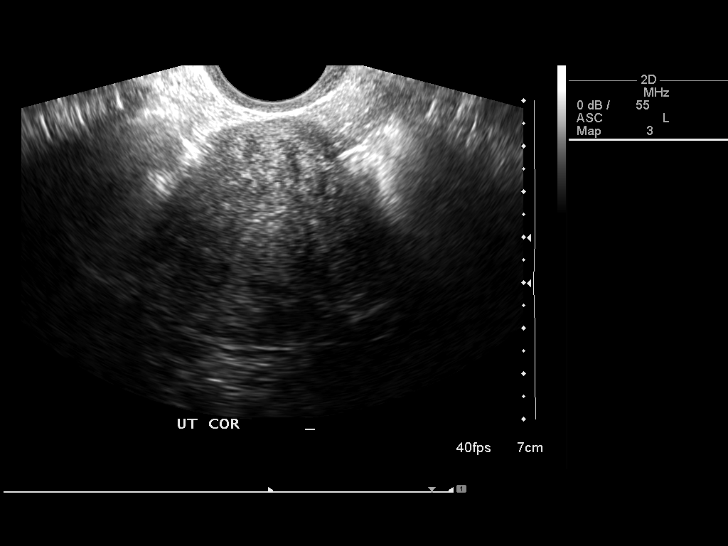
[im 18/29]
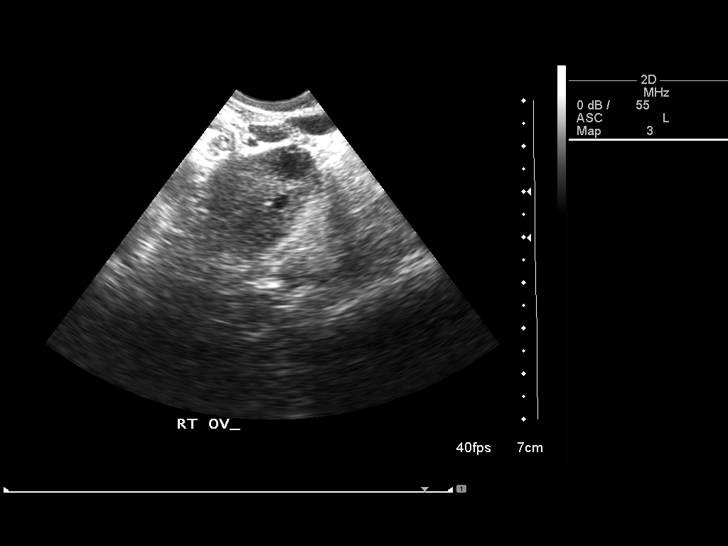
[im 19/29]
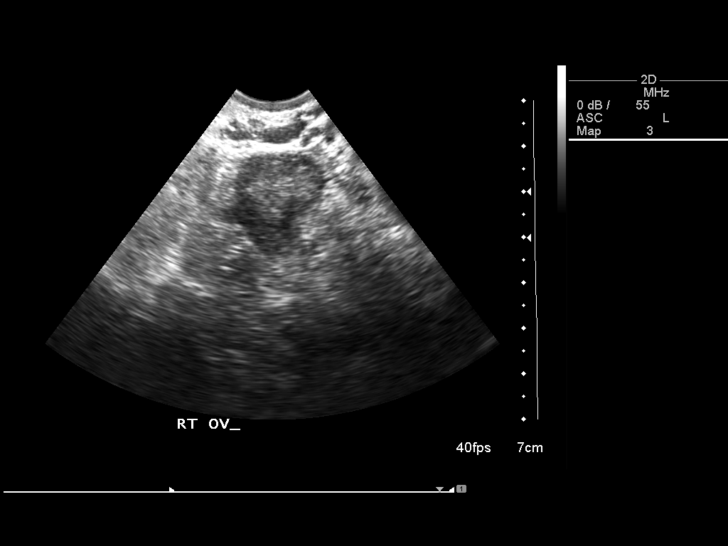
[im 22/29]
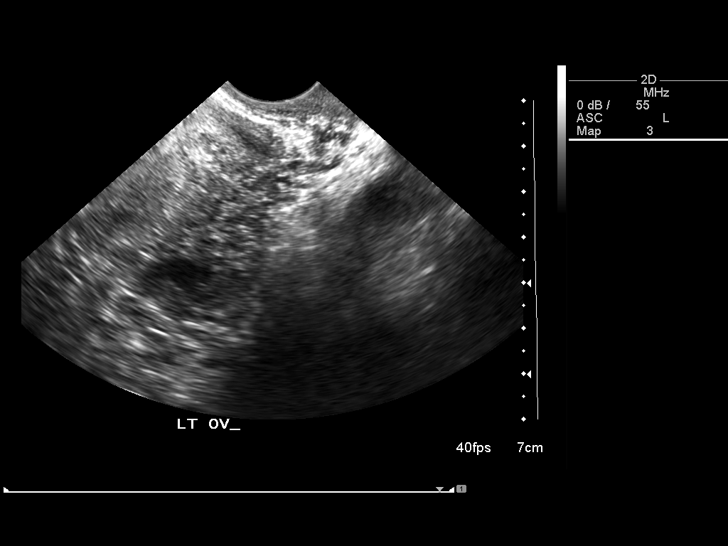
[im 24/29]
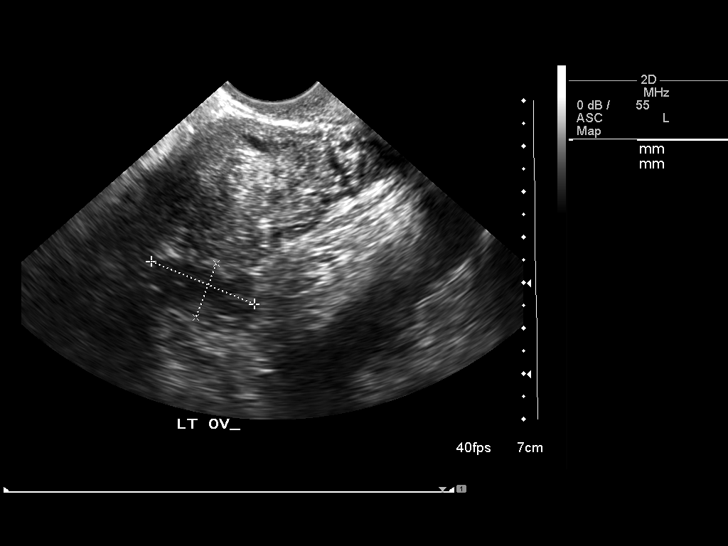
[im 26/29]
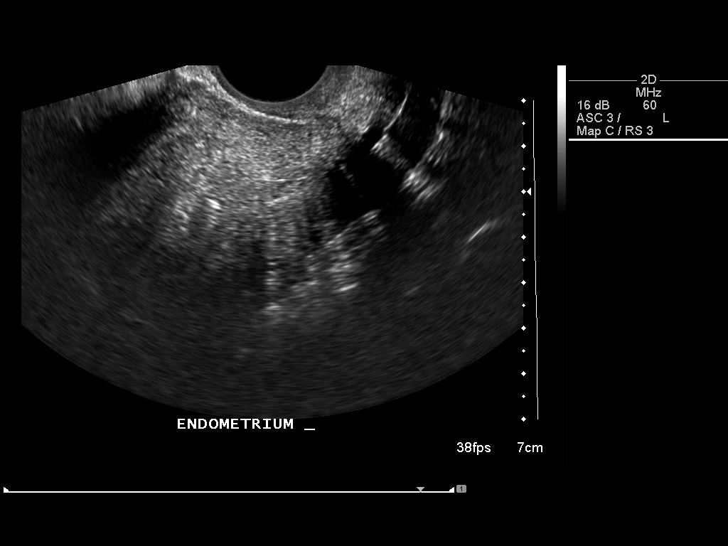
[im 29/29]
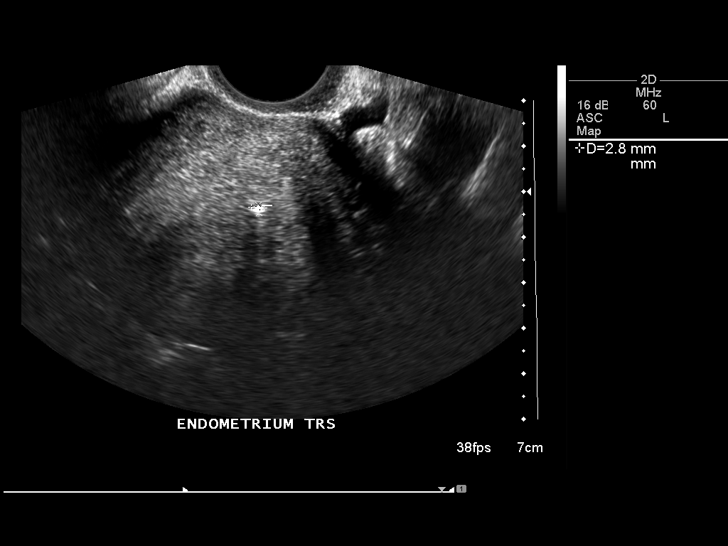

[14 of 25 positions shown; findings below may reference images not displayed]

FINDINGS: Uterus measures 9.2 x 5.2 x 6.8 cm.  There is diffuse increased echogenicity of the uterine parenchyma raising the possibility of adenomyosis.  Endometrial lining thickness is 9 mm.  Within the endometrial canal is a 2 mm echogenic focus which may represent a small polyp. 
 Right ovary 3 x 2.1 x 2 cm and the left ovary 2.4 x 1.5 x 2.5 cm without dominant mass.  No free fluid.
IMPRESSION: 1.  No evidence of dominant ovarian mass. 
 2.  Heterogeneous appearance of the uterus raising the possibility of adenomyosis. 
 3.  2 mm echogenic focus within the endometrial canal.  Question small polyp.

## 2009-05-17 ENCOUNTER — Encounter: Payer: Self-pay | Admitting: Family Medicine

## 2009-05-17 ENCOUNTER — Ambulatory Visit: Payer: Self-pay | Admitting: Family Medicine

## 2009-05-22 ENCOUNTER — Emergency Department (HOSPITAL_COMMUNITY): Admission: EM | Admit: 2009-05-22 | Discharge: 2009-05-22 | Payer: Self-pay | Admitting: Emergency Medicine

## 2009-05-22 ENCOUNTER — Telehealth: Payer: Self-pay | Admitting: Family Medicine

## 2009-05-27 ENCOUNTER — Ambulatory Visit (HOSPITAL_COMMUNITY): Admission: RE | Admit: 2009-05-27 | Discharge: 2009-05-27 | Payer: Self-pay | Admitting: Family Medicine

## 2009-05-27 ENCOUNTER — Encounter: Payer: Self-pay | Admitting: Family Medicine

## 2009-05-27 ENCOUNTER — Ambulatory Visit: Payer: Self-pay | Admitting: Family Medicine

## 2009-05-27 ENCOUNTER — Encounter: Payer: Self-pay | Admitting: *Deleted

## 2009-05-27 LAB — CONVERTED CEMR LAB
BUN: 10 mg/dL (ref 6–23)
Basophils Relative: 0 % (ref 0–1)
CO2: 24 meq/L (ref 19–32)
Calcium: 9.4 mg/dL (ref 8.4–10.5)
Chloride: 104 meq/L (ref 96–112)
Creatinine, Ser: 0.75 mg/dL (ref 0.40–1.20)
Eosinophils Absolute: 0.2 10*3/uL (ref 0.0–0.7)
Eosinophils Relative: 3 % (ref 0–5)
HCT: 33.7 % — ABNORMAL LOW (ref 36.0–46.0)
MCHC: 32 g/dL (ref 30.0–36.0)
MCV: 85.8 fL (ref 78.0–100.0)
Neutrophils Relative %: 51 % (ref 43–77)
Platelets: 311 10*3/uL (ref 150–400)

## 2009-05-28 ENCOUNTER — Telehealth: Payer: Self-pay | Admitting: Family Medicine

## 2009-06-16 ENCOUNTER — Telehealth (INDEPENDENT_AMBULATORY_CARE_PROVIDER_SITE_OTHER): Payer: Self-pay | Admitting: *Deleted

## 2009-07-21 ENCOUNTER — Ambulatory Visit: Payer: Self-pay | Admitting: Family Medicine

## 2009-07-22 ENCOUNTER — Telehealth: Payer: Self-pay | Admitting: Family Medicine

## 2009-07-22 ENCOUNTER — Telehealth (INDEPENDENT_AMBULATORY_CARE_PROVIDER_SITE_OTHER): Payer: Self-pay | Admitting: *Deleted

## 2009-08-20 ENCOUNTER — Encounter: Payer: Self-pay | Admitting: *Deleted

## 2009-08-26 ENCOUNTER — Encounter: Payer: Self-pay | Admitting: Family Medicine

## 2009-08-30 ENCOUNTER — Ambulatory Visit: Payer: Self-pay | Admitting: Family Medicine

## 2009-08-30 ENCOUNTER — Encounter: Payer: Self-pay | Admitting: Family Medicine

## 2009-08-30 LAB — CONVERTED CEMR LAB: Hemoglobin: 12.4 g/dL

## 2009-08-31 ENCOUNTER — Encounter: Payer: Self-pay | Admitting: Family Medicine

## 2009-08-31 LAB — CONVERTED CEMR LAB
CO2: 27 meq/L (ref 19–32)
Calcium: 9.4 mg/dL (ref 8.4–10.5)
Creatinine, Ser: 0.78 mg/dL (ref 0.40–1.20)
Direct LDL: 130 mg/dL — ABNORMAL HIGH
Glucose, Bld: 81 mg/dL (ref 70–99)
Sodium: 141 meq/L (ref 135–145)

## 2009-09-02 ENCOUNTER — Telehealth (INDEPENDENT_AMBULATORY_CARE_PROVIDER_SITE_OTHER): Payer: Self-pay | Admitting: *Deleted

## 2009-10-01 ENCOUNTER — Ambulatory Visit: Payer: Self-pay | Admitting: Family Medicine

## 2009-10-07 ENCOUNTER — Telehealth: Payer: Self-pay | Admitting: Family Medicine

## 2009-10-15 ENCOUNTER — Ambulatory Visit: Payer: Self-pay | Admitting: Family Medicine

## 2009-10-21 ENCOUNTER — Encounter: Payer: Self-pay | Admitting: Family Medicine

## 2009-10-27 ENCOUNTER — Ambulatory Visit (HOSPITAL_COMMUNITY): Admission: RE | Admit: 2009-10-27 | Discharge: 2009-10-27 | Payer: Self-pay | Admitting: Family Medicine

## 2009-10-29 ENCOUNTER — Encounter: Payer: Self-pay | Admitting: Family Medicine

## 2009-11-02 ENCOUNTER — Encounter: Payer: Self-pay | Admitting: Family Medicine

## 2009-11-06 ENCOUNTER — Ambulatory Visit (HOSPITAL_COMMUNITY): Admission: RE | Admit: 2009-11-06 | Discharge: 2009-11-06 | Payer: Self-pay | Admitting: Family Medicine

## 2009-11-10 ENCOUNTER — Telehealth: Payer: Self-pay | Admitting: Family Medicine

## 2009-11-12 ENCOUNTER — Telehealth: Payer: Self-pay | Admitting: *Deleted

## 2009-11-12 ENCOUNTER — Ambulatory Visit: Payer: Self-pay | Admitting: Family Medicine

## 2009-11-12 LAB — CONVERTED CEMR LAB: Whiff Test: NEGATIVE

## 2009-11-15 ENCOUNTER — Telehealth: Payer: Self-pay | Admitting: Family Medicine

## 2009-11-16 ENCOUNTER — Telehealth: Payer: Self-pay | Admitting: *Deleted

## 2009-12-14 ENCOUNTER — Telehealth: Payer: Self-pay | Admitting: Family Medicine

## 2009-12-15 ENCOUNTER — Telehealth: Payer: Self-pay | Admitting: *Deleted

## 2009-12-15 ENCOUNTER — Ambulatory Visit: Payer: Self-pay | Admitting: Family Medicine

## 2009-12-15 ENCOUNTER — Encounter: Payer: Self-pay | Admitting: *Deleted

## 2009-12-16 ENCOUNTER — Encounter: Payer: Self-pay | Admitting: Family Medicine

## 2009-12-16 LAB — CONVERTED CEMR LAB
HCT: 36.4 % (ref 36.0–46.0)
Hemoglobin: 11.9 g/dL — ABNORMAL LOW (ref 12.0–15.0)
Iron: 63 ug/dL (ref 42–145)
MCV: 88.1 fL (ref 78.0–100.0)
RBC: 4.13 M/uL (ref 3.87–5.11)
Saturation Ratios: 17 % — ABNORMAL LOW (ref 20–55)
WBC: 7.7 10*3/uL (ref 4.0–10.5)

## 2009-12-20 ENCOUNTER — Telehealth: Payer: Self-pay | Admitting: Family Medicine

## 2010-02-01 ENCOUNTER — Ambulatory Visit: Payer: Self-pay | Admitting: Family Medicine

## 2010-02-02 ENCOUNTER — Telehealth (INDEPENDENT_AMBULATORY_CARE_PROVIDER_SITE_OTHER): Payer: Self-pay | Admitting: *Deleted

## 2010-03-18 ENCOUNTER — Ambulatory Visit: Admission: RE | Admit: 2010-03-18 | Discharge: 2010-03-18 | Payer: Self-pay | Source: Home / Self Care

## 2010-03-18 DIAGNOSIS — M65849 Other synovitis and tenosynovitis, unspecified hand: Secondary | ICD-10-CM

## 2010-03-18 DIAGNOSIS — M79609 Pain in unspecified limb: Secondary | ICD-10-CM | POA: Insufficient documentation

## 2010-03-18 DIAGNOSIS — R61 Generalized hyperhidrosis: Secondary | ICD-10-CM | POA: Insufficient documentation

## 2010-03-18 DIAGNOSIS — M65839 Other synovitis and tenosynovitis, unspecified forearm: Secondary | ICD-10-CM | POA: Insufficient documentation

## 2010-03-27 ENCOUNTER — Encounter: Payer: Self-pay | Admitting: Family Medicine

## 2010-04-04 ENCOUNTER — Ambulatory Visit: Admission: RE | Admit: 2010-04-04 | Discharge: 2010-04-04 | Payer: Self-pay | Source: Home / Self Care

## 2010-04-04 DIAGNOSIS — M25539 Pain in unspecified wrist: Secondary | ICD-10-CM | POA: Insufficient documentation

## 2010-04-05 NOTE — Progress Notes (Signed)
   Phone Note Call from Patient   Caller: Patient Details for Reason: Fever Summary of Call: Pt with fever since monday, diagnosed with flu-like illness. Felt better Wed therefore did not come to appt scheduled. Thursday fever returned and persisted Tmax 101. No SOB, n/v, dyusria, chest pain, diarrhea.Persistant muscle aches. I advised her this is likely viral still but she can go to urgent care as I can not prescribe her anything. She wanted blood work done as well.  Pt to go to Urgent care Initial call taken by: Milinda Antis MD,  May 22, 2009 9:53 AM

## 2010-04-05 NOTE — Letter (Signed)
Summary: Out of Work  [facility]  [address]   [place], [place] [address]   Phone: [phone]  Fax: [phone]    May 27, 2009   Employee:  [name] [name]    To Whom It May Concern:   For Medical reasons, please excuse the above named employee from work for the following dates:  Start:   May 27, 2009  End:   May 27, 2009  If you need additional information, please feel free to contact our office.         Sincerely,    Loralee Pacas CMA

## 2010-04-05 NOTE — Progress Notes (Signed)
Summary: meds  Medications Added ZOFRAN 4 MG TABS (ONDANSETRON HCL) 1 by mouth q 8 hrs as needed nausea       Phone Note Call from Patient Call back at [facility] Phone [phone]   Caller: Patient Summary of Call: pt has virus and wants something called in for nausea to [facility] Initial call taken by: De Nurse,  February 02, 2010 12:23 PM  Follow-up for Phone Call        patient was in office yesterday. she has not vomited since yesterday but continues with nausea. she is able to hold liquids down and has eaten some popcorn  today. has been able to take her meds today also.  advised best foods to nibble on are dry crackers, dry toast. advised to continue pushing fluids. will ask preceptor  if RX can be sent in . Pharmacy [facility]. Follow-up by: Theresia Lo RN,  February 02, 2010 12:32 PM    New/Updated Medications: ZOFRAN 4 MG TABS (ONDANSETRON HCL) 1 by mouth q 8 hrs as needed nausea Prescriptions: ZOFRAN 4 MG TABS (ONDANSETRON HCL) 1 by mouth q 8 hrs as needed nausea  #30 x 0   Entered by:   Theresia Lo RN   Authorized by:   Denny Levy MD   Signed by:   Theresia Lo RN on 02/02/2010   Method used:   Electronically to        [facility] (retail)       [address]       [place], [place]  [address]       Ph: [phone]       Fax: [phone]   RxID:   [id number] ZOFRAN 4 MG TABS (ONDANSETRON HCL) 1 by mouth q 8 hrs as needed nausea  #30 x 0   Entered and Authorized by:   Denny Levy MD   Signed by:   Denny Levy MD on 02/02/2010   Method used:   Electronically to        [facility] (retail)       [address]       [place], [place]  [address]       Ph: [phone]       Fax: [phone]   RxID:   [id number]  called [facility] to cancel RX. then called  [facility] and they do not have Zofran. also RN asked about phenergan just to see if they have and they do not have  that either.  called [facility] again and rx resent electronically.  Theresia Lo RN  February 02, 2010 3:39 PM

## 2010-04-05 NOTE — Miscellaneous (Signed)
Summary: call from Dr. Ledon Snare   Clinical Lists Changes Dr. Ledon Snare calls wanting to check old chart to see when patient was on Prozac and Paxil and dosage.  patient is in his office now. pulled old chart and advised him of this information. unable to locate paxil use but found prozac that was increased to 40 mg 03/2005.    per chart appears she stopped med before visit of 05/27/2007with  Dr. Kathrynn Running in [facility].  Theresia Lo RN  August 20, 2009 2:43 PM   Appended Document: call from Dr. Ledon Snare  Dr. Ledon Snare calls back stating that patient has been on Pristiq 50 mg for 3.5 weeks and she is having an increased appetite and need for increased carbohydrate intake. he states this is a common side effect and she will not be able to continue this med . he suggests she start Effexor XR 37.5 mg , brand name only. Marland Kitchen advises to start with one and increase to two. Pharmacy is [facility] . will give this information to Dr. Lafonda Mosses to order and will contact patient to let her know when it has been sent in.  phone [phone]. Theresia Lo RN  August 20, 2009 2:56 PM  Those changes are fine with me  Would you call pt and let her know that prescription will be faxed today.  Thanks Prescriptions: EFFEXOR XR 37.5 MG XR24H-CAP (VENLAFAXINE HCL) 1 tab by mouth daily for 2 weeks; then 2 tabs by mouth daily.  dispense brand name  #60 x 2   Entered and Authorized by:   Asher Muir MD   Signed by:   Asher Muir MD on 08/23/2009   Method used:   Print then Give to Patient   RxID:   [id number] EFFEXOR XR 37.5 MG XR24H-CAP (VENLAFAXINE HCL) 1 tab by mouth daily for 2 weeks; then 2 tabs by mouth daily.  dispense brand name  #60 x 2   Entered and Authorized by:   Asher Muir MD   Signed by:   Asher Muir MD on 08/23/2009   Method used:   Print then Give to Patient   RxID:   [id number]   Appended Document: call from Dr. Ledon Snare message left on  voicemail to call back. Theresia Lo RN  August 23, 2009 10:19 AM   patient calls back and she is advised that rx has been faxed. Theresia Lo RN  August 23, 2009 12:36 PM

## 2010-04-05 NOTE — Miscellaneous (Signed)
Summary: Re: ENT referral   Clinical Lists Changes  Received notification ffrom  [facility] that they are unable to complete the referral for ENT at this time due to lack of volunteer physicians in this speciality group. they will contact patient when they can process referral. Theresia Lo RN  October 21, 2009 4:30 PM  Please let the patient know that her appointment may be weeks to months away. If her symptoms have resolved we could cancel the referral, if she is still having pain/difficulty eating and opening the jaw, then we need to get her into another specialist (Oral Maxillofacial Surgeon?). Jamie Brookes MD  October 25, 2009 10:24 PM    message left to call back. Theresia Lo RN  October 26, 2009 11:30 AM  spoke with patient and she is still having pain. gave her message from MD.  will send message to MD to ask for furhter referral.  she lost xray referral form but will come by today to pick up new order. Dr. Mauricio Po signed.Theresia Lo RN  October 27, 2009 9:28 AM   Spoke to patient. Her symptoms are about the same and maybe worse at times. Having left ear pain, left neck pain, left tongue numbness. No headaches. No grinding of her teeth. Told her if her symptoms are still present in 1 week I want her to come back in to consider a CT scan. Could be at worst a mass affecting the facial nerve or other nerves coming out of her brain. Pt agrees to make an appointment for 1 week. Jamie Brookes MD  October 28, 2009 5:35 PM

## 2010-04-05 NOTE — Progress Notes (Signed)
Summary: Test Res   Phone Note Call from Patient Call back at Home Phone [phone]   Caller: Patient Summary of Call: Checking on MRI and x-ray results. Initial call taken by: Clydell Hakim,  November 15, 2009 3:13 PM  Follow-up for Phone Call        will forward to MD. Follow-up by: Theresia Lo RN,  November 15, 2009 3:24 PM  Additional Follow-up for Phone Call Additional follow up Details #1::        Called the patient and left a message. Told her that her x-ray and MRI were both normal. I spoke to Dr. Jennette Kettle (sports med MD) and asked her opinion about what to do next. She recommended PT for the jaw and I have already written that order. She will be contacted with a time and date for PT.  Additional Follow-up by: Jamie Brookes MD,  November 16, 2009 9:51 AM

## 2010-04-05 NOTE — Assessment & Plan Note (Signed)
Summary: fever,HA,cough/[place]   Vital Signs:  Patient profile:   35 year old female Height:      68 inches Weight:      313 pounds BMI:     47.76 Temp:     100.3 degrees F oral Pulse rate:   102 / minute BP sitting:   133 / 94  (right arm) Cuff size:   large  Vitals Entered By: Tessie Fass CMA (May 17, 2009 8:54 AM) CC: fever Pain Assessment Patient in pain? yes     Location: head Intensity: 5   CC:  fever.  Acute Visit History:      The patient complains of cough, fever, headache, and nausea.  These symptoms began 4 days ago.  She denies abdominal pain, chest pain, genitourinary symptoms, musculoskeletal symptoms, nasal discharge, sore throat, and vomiting.  Other comments include: works in childcare. did not get flu vaccine this year. +lightheadedness, tingling of fingernails and tip of tongue, nausea without vomiting, HA. myalgias 1st day of illness. no rhinorrhea, constipation, diarrhea, dysuria, CP, SOB. no sick contacts in house. Marland Kitchen        Her highest temperature has been 103.  The fever has been off and on.  The fever has improved with acetaminophen.        The character of the cough is described as nonproductive.  She has no history of COPD.  There is no history of wheezing, sleep interference, shortness of breath, respiratory retractions, tachypnea, cyanosis, or interference with oral intake associated with her cough.        Current Medications (verified): 1)  Hyzaar 100-25 Mg Tabs (Losartan Potassium-Hctz) .... Take 1 Tablet By Mouth Once A Day 2)  Lamictal 100 Mg  Tabs (Lamotrigine) .... Take Three A Day. 3)  Ferrous Sulfate 325 (65 Fe) Mg  Tbec (Ferrous Sulfate) .... Once Daily 4)  Miralax   Powd (Polyethylene Glycol 3350) .Marland Kitchen.. 17g By Mouth Once Daily As Needed Constipation, Disp Bulk Powder, Qs X 1 Month 5)  Premarin 1.25 Mg  Tabs (Estrogens Conjugated) .... Take One Tablet Daily 6)  Calcium 600/vitamin D 600-400 Mg-Unit  Chew (Calcium Carbonate-Vitamin D)  .... Take 1 Tablet Po Two Times A Day 7)  Ibu 800 Mg  Tabs (Ibuprofen) .Marland Kitchen.. 1 Tab By Mouth Three Times A Day With Food For 2 Weeks; Then Take Tid As Needed For Back Pain 8)  Metformin Hcl 500 Mg Tabs (Metformin Hcl) .... Please Take One Three Times A Day With Food. 9)  Xenical 120 Mg Caps (Orlistat) .Marland Kitchen.. 1 Tab Po 3 Times/day With Each Main Meal Containing Fat (During or Up To 1 Hour After The Meal); Omit Dose If Meal Is Occasionally Missed or Contains No Fat. 10)  Bitoin Plus/calcium/vit D3  Tabs (Multiple Vitamins-Minerals) 11)  Vitamin D3 10000 Unit Caps (Cholecalciferol) .... Take 5 Tabs By Mouth Every Monday For The Next 8 Weeks 12)  Cymbalta 30 Mg Cpep (Duloxetine Hcl) .Marland Kitchen.. 1 Tab By Mouth Daily For 1 Week; Then 2 Tabs By Mouth Daily For Depression  Allergies (verified): 1)  ! Imitrex 2)  ! * Zomig 3)  ! Morphine 4)  ! * Dilaudid 5)  ! Demerol 6)  Lisinopril (Lisinopril) 7)  * Oxycodone  Physical Exam  General:  obese female, NAD, vitals reviewed. low grade fever.  Head:  no TTP over maxillary or frontal sinuses Eyes:  EOMI, PERRLA Ears:  External ear exam shows no significant lesions or deformities.  Otoscopic examination reveals clear canals,  tympanic membranes are intact bilaterally without bulging, retraction, inflammation or discharge. Hearing is grossly normal bilaterally. Nose:  no rhinorrhea Mouth:  Oral mucosa and oropharynx without lesions or exudates.  Teeth in good repair. Neck:  No deformities, masses, or tenderness noted. Lungs:  Normal respiratory effort, chest expands symmetrically. Lungs are clear to auscultation, no crackles or wheezes. Heart:  tachycardic, regular rhythm. S1 and S2 normal without gallop, murmur, click, rub or other extra sounds. Abdomen:  Bowel sounds positive,abdomen soft and non-tender without masses, organomegaly or hernias noted.   Impression & Recommendations:  Problem # 1:  INFLUENZA LIKE ILLNESS (ICD-487.1) Assessment New  supportive  care only. encouraged alternate acetaminophen and ibuprofen. RTC if continued fever in 4-5 days or if symptoms worsening.   Orders: [facility]- Est Level  3 ([address])

## 2010-04-05 NOTE — Assessment & Plan Note (Signed)
Summary: rash, Bipolar meds, Vit D   Vital Signs:  Patient profile:   35 year old female Weight:      319.7 pounds BMI:     48.79 Temp:     98.3 degrees F Pulse rate:   96 / minute BP sitting:   136 / 85  (left arm)  Vitals Entered By: Jamie Brookes MD (October 01, 2009 3:01 PM) CC: skin patches, Memory Is Patient Diabetic? No Pain Assessment Patient in pain? no        Primary Care Provider:  Jamie Brookes MD  CC:  skin patches and Memory.  History of Present Illness: Skin patches: Pt has notived that she has had skin patches on the back of her neck fot the last 2 weeks. They do not itch. She says that her mother has had something like this before. She wants to know what they are. They are lighter color than the rest of her skin.   Memory: Pt was taking Prisiq prescribed by her psychiatrist (Dr. Ledon Snare). She was starting to eat a lot and gain weight as well as feel like her memory was impaired. It took a good 6 weeks for her symptoms to get bad and she is just now starting to feel a little better on the Effexor (which is what Dr. Ledon Snare switched her to).   Habits & Providers  Alcohol-Tobacco-Diet     Tobacco Status: never  Current Medications (verified): 1)  Lamictal 100 Mg  Tabs (Lamotrigine) .... Take Three A Day. 2)  Miralax   Powd (Polyethylene Glycol 3350) .Marland Kitchen.. 17g By Mouth Once Daily As Needed Constipation, Disp Bulk Powder, Qs X 1 Month 3)  Premarin 1.25 Mg  Tabs (Estrogens Conjugated) .... Take One Tablet Daily 4)  Calcium 600/vitamin D 600-400 Mg-Unit  Chew (Calcium Carbonate-Vitamin D) .... Take 1 Tablet Po Two Times A Day 5)  Ibuprofen 600 Mg Tabs (Ibuprofen) .Marland Kitchen.. 1 Tab By Mouth Three Times A Day As Needed Pain.  Take With Food 6)  Ferrous Sulfate 325 (65 Fe) Mg Tbec (Ferrous Sulfate) .Marland Kitchen.. 1 Tab By Mouth Two Times A Day For Anemia 7)  Venlafaxine Hcl 37.5 Mg Xr24h-Cap (Venlafaxine Hcl) .... Take 2 Tabs Per Day  Allergies (verified): 1)  ! Imitrex 2)  ! *  Zomig 3)  ! Morphine 4)  ! * Dilaudid 5)  ! Demerol 6)  Lisinopril (Lisinopril) 7)  * Oxycodone  Review of Systems        vitals reviewed and pertinent negatives and positives seen in HPI   Physical Exam  General:  Well-developed,well-nourished,in no acute distress; alert,appropriate and cooperative throughout examination Psych:  Cognition and judgment appear intact. Alert and cooperative with normal attention span and concentration. No apparent delusions, illusions, hallucinations, no signs of depression today, pt appears to have good memory and clarity of thought.    Impression & Recommendations:  Problem # 1:  SKIN RASH (ICD-782.1) Assessment New Pt has a new skin rash. Plan to send it for KOH. Will determine if it is fungal or something else.   Orders: [facility]- Est Level  3 ([address]) K[facility] ([address])  Problem # 2:  BIPOLAR DISORDER (ICD-296.7) Assessment: Improved Pt has been switched from Prisiq to Effexor and feels much better. Her psychiatrist is working with her on this issue.   Orders: [facility]- Est Level  3 ([address])  Problem # 3:  UNSPECIFIED VITAMIN D DEFICIENCY (ICD-268.9) Assessment: Comment Only Pt has not been taking enough vit D to make a  difference in her levels. Discussed with patient and we concluded that once she has been taking about [phone] mg per day for 2-3 months we will retest her levels. She agrees.   Complete Medication List: 1)  Lamictal 100 Mg Tabs (Lamotrigine) .... Take three a day. 2)  Miralax Powd (Polyethylene glycol 3350) .Marland Kitchen.. 17g by mouth once daily as needed constipation, disp bulk powder, qs x 1 month 3)  Premarin 1.25 Mg Tabs (Estrogens conjugated) .... Take one tablet daily 4)  Calcium 600/vitamin D 600-400 Mg-unit Chew (Calcium carbonate-vitamin d) .... Take 1 tablet po two times a day 5)  Ibuprofen 600 Mg Tabs (Ibuprofen) .Marland Kitchen.. 1 tab by mouth three times a day as needed pain.  take with food 6)  Ferrous Sulfate 325 (65 Fe) Mg Tbec (Ferrous  sulfate) .Marland Kitchen.. 1 tab by mouth two times a day for anemia 7)  Venlafaxine Hcl 37.5 Mg Xr24h-cap (Venlafaxine hcl) .... Take 2 tabs per day  Patient Instructions: 1)  Make sure to use 2 cal-vitd tablets every day.  2)  I will see you in Sept to retest your Vit D levels and Iron levels.   Appended Document: KOH positive Medications Added KETOCONAZOLE 2 % CREA (KETOCONAZOLE) apply to affecteds skin BId x 2 weeks or until 5 days after rash disappears.  Give 1 large tube       Pt's rash was found to be KOH positive.   Clinical Lists Changes  Medications: Added new medication of KETOCONAZOLE 2 % CREA (KETOCONAZOLE) apply to affecteds skin BId x 2 weeks or until 5 days after rash disappears.  Give 1 large tube - Signed Rx of KETOCONAZOLE 2 % CREA (KETOCONAZOLE) apply to affecteds skin BId x 2 weeks or until 5 days after rash disappears.  Give 1 large tube;  #1 x 1;  Signed;  Entered by: Jamie Brookes MD;  Authorized by: Jamie Brookes MD;  Method used: Electronically to [facility] [address], [place], [place]  [id number], Ph: [phone], Fax: [phone]    Prescriptions: KETOCONAZOLE 2 % CREA (KETOCONAZOLE) apply to affecteds skin BId x 2 weeks or until 5 days after rash disappears.  Give 1 large tube  #1 x 1   Entered and Authorized by:   Jamie Brookes MD   Signed by:   Jamie Brookes MD on 10/06/2009   Method used:   Electronically to        [facility]* (retail)       [address]       [place], [place]  [address]       Ph: [phone]       Fax: [phone]   RxID:   [id number]    Appended Document: KOH  = positive     Lab Visit  Laboratory Results  Date/Time Received: October 06, 2009 1:57 PM  Date/Time Reported: October 06, 2009 1:57 PM   Other Tests  Skin KOH: Positive Comments: slides were collected Fri 7-29 but received in lab Wed 10-06-09 ...............test performed by......Marland KitchenBonnie A. Swaz[place], MLS  (ASCP)cm   Orders Today:

## 2010-04-05 NOTE — Progress Notes (Signed)
Summary: TMJ pain   pt wanted results of imaging. told her it was normal. she wanted to know what to do next. told her I will send this to her md & will call her with response.Golden Circle RN  November 10, 2009 11:29 AM   I have posed the question to one of my attendings. She has a referral that is waiting to see when an ENT MD will accept. We have done both an x-ray and MRI that indicate the structures around her jaw are normal. I will call her when I have heard back from my attending. Thanks Kennon Rounds. Jamie Brookes MD  November 11, 2009 11:10 AM  pt called today asking for appt for c/o "yeast" will be seen at 8:30 work in as pcp is full & she has to take her child somewhere at 4.Golden Circle RN  November 11, 2009 3:16 PM  I heard back from Dr. Jennette Kettle. She says that she belives (and I agree) that this is TMJ pain. There is physical therapy for TMJ problems. I will write a referral for PT.  Please let the patient know. Thanks.  Jamie Brookes MD  November 12, 2009 2:36 PM  Appended Document: TMJ pain lm to call back..  when she does, tell her about PT for TMJ

## 2010-04-05 NOTE — Letter (Signed)
Summary: Out of Work  [facility]  [address]   [place], [place] [address]   Phone: [phone]  Fax: [phone]    May 17, 2009   Employee:  ANGEE GUPTON    To Whom It May Concern:   For Medical reasons, please excuse the above named employee from work for the following dates:  Start:   Monday May 17, 2009  End:   Wednesday May 19, 2009  Patient may return to work sooner if without a fever for >24 hours.   If you need additional information, please feel free to contact our office.         Sincerely,    Lequita Asal  MD

## 2010-04-05 NOTE — Assessment & Plan Note (Signed)
Summary: yeast per pt x 6 days/[place]/strother   Vital Signs:  Patient profile:   35 year old female Weight:      313 pounds Temp:     98.6 degrees F Pulse rate:   68 / minute BP sitting:   130 / 94  (left arm)  Vitals Entered By: Theresia Lo RN (November 12, 2009 8:49 AM) CC: vaginal itching and odor Is Patient Diabetic? No Pain Assessment Patient in pain? no        Primary Care Provider:  Jamie Brookes MD  CC:  vaginal itching and odor.  History of Present Illness: 34 yo F:  1. Vaginal Itching: x 1 week with associated odor. Denies fever/chills, rash, dysuria, discharge. Endorses Hx of STD when she was 1 but now in monogamous relationship with no concerns at this time. Declines STD testing. LMP 2008 - now s/p full hysterectomy for endometriosis. She has recently changed to bar soap. +PMHx of interstitial cystitis.   Habits & Providers  Alcohol-Tobacco-Diet     Tobacco Status: never  Current Medications (verified): 1)  Lamictal 100 Mg  Tabs (Lamotrigine) .... Take Three A Day. 2)  Miralax   Powd (Polyethylene Glycol 3350) .Marland Kitchen.. 17g By Mouth Once Daily As Needed Constipation, Disp Bulk Powder, Qs X 1 Month 3)  Premarin 1.25 Mg  Tabs (Estrogens Conjugated) .... Take One Tablet Daily 4)  Calcium 600/vitamin D 600-400 Mg-Unit  Chew (Calcium Carbonate-Vitamin D) .... Take 1 Tablet Po Two Times A Day 5)  Ibuprofen 600 Mg Tabs (Ibuprofen) .Marland Kitchen.. 1 Tab By Mouth Three Times A Day As Needed Pain.  Take With Food 6)  Ferrous Sulfate 325 (65 Fe) Mg Tbec (Ferrous Sulfate) .Marland Kitchen.. 1 Tab By Mouth Two Times A Day For Anemia 7)  Venlafaxine Hcl 37.5 Mg Xr24h-Cap (Venlafaxine Hcl) .... Take 2 Tabs Per Day 8)  Ketoconazole 2 % Crea (Ketoconazole) .... Apply To Affecteds Skin Bid X 2 Weeks or Until 5 Days After Rash Disappears.  Give 1 Large Tube  Allergies (verified): 1)  ! Imitrex 2)  ! * Zomig 3)  ! Morphine 4)  ! * Dilaudid 5)  ! Demerol 6)  Lisinopril (Lisinopril) 7)  *  Oxycodone PMH-FH-SH reviewed for relevance  Review of Systems      See HPI  Physical Exam  General:  Well-developed, well-nourished, in no acute distress; alert, appropriate and cooperative throughout examination. Vitals reviewed. Abdomen:  Obese. Genitalia:  Pelvic Exam:        External: normal female genitalia without lesions or masses        Vagina: normal without lesions or masses, small amount of thin white vaginal discharge - samples taken. Psych:  Oriented X3, memory intact for recent and remote, normally interactive, good eye contact, not anxious appearing, and not depressed appearing.     Impression & Recommendations:  Problem # 1:  VAGINAL PRURITUS (ICD-698.1)  Orders: [facility]- Est Level  3 ([address])  Problem # 2:  CANDIDIASIS, VULVOVAGINAL (ICD-112.1)  The following medications were removed from the medication list:    Ketoconazole 2 % Crea (Ketoconazole) .Marland Kitchen... Apply to affecteds skin bid x 2 weeks or until 5 days after rash disappears.  give 1 large tube Her updated medication list for this problem includes:    Diflucan 150 Mg Tabs (Fluconazole) ..... Once daily x 1, then again 1 week later  Orders: [facility]- Est Level  3 ([address])  Complete Medication List: 1)  Lamictal 100 Mg Tabs (Lamotrigine) .... Take three  a day. 2)  Miralax Powd (Polyethylene glycol 3350) .Marland Kitchen.. 17g by mouth once daily as needed constipation, disp bulk powder, qs x 1 month 3)  Premarin 1.25 Mg Tabs (Estrogens conjugated) .... Take one tablet daily 4)  Calcium 600/vitamin D 600-400 Mg-unit Chew (Calcium carbonate-vitamin d) .... Take 1 tablet po two times a day 5)  Ibuprofen 600 Mg Tabs (Ibuprofen) .Marland Kitchen.. 1 tab by mouth three times a day as needed pain.  take with food 6)  Ferrous Sulfate 325 (65 Fe) Mg Tbec (Ferrous sulfate) .Marland Kitchen.. 1 tab by mouth two times a day for anemia 7)  Venlafaxine Hcl 37.5 Mg Xr24h-cap (Venlafaxine hcl) .... Take 2 tabs per day 8)  Diflucan 150 Mg Tabs (Fluconazole) .... Once daily x  1, then again 1 week later  [id number]  Patient Instructions: 1)  We will call you with the results of your lab. Prescriptions: DIFLUCAN 150 MG TABS (FLUCONAZOLE) once daily x 1, then again 1 week later  #2 x 0   Entered and Authorized by:   Helane Rima DO   Signed by:   Helane Rima DO on 11/12/2009   Method used:   Telephoned to ...       [facility] (retail)       [address]       [place], [place]  [id number]       Ph: [phone]       Fax: [phone]   RxID:   [id number]   Laboratory Results  Date/Time Received: November 12, 2009 9:23 AM  Date/Time Reported: November 12, 2009 9:57 AM   Wet [place] Source: vag WBC/hpf: 1-5 Bacteria/hpf: 2+  rods and cocci Clue cells/hpf: few  Negative whiff Yeast/hpf: many Trichomonas/hpf: none Comments: ...............test performed by......Marland KitchenBonnie A. Swazil[place], MLS (ASCP)cm

## 2010-04-05 NOTE — Progress Notes (Signed)
Summary: phone note/ lab appt   Phone Note Outgoing Call   Call placed by: Loralee Pacas CMA,  December 15, 2009 10:32 AM Summary of Call: asked pt to call back to make lab appt  Follow-up for Phone Call        letter sent to pt Follow-up by: Loralee Pacas CMA,  December 15, 2009 12:21 PM

## 2010-04-05 NOTE — Progress Notes (Signed)
Summary: triage   Phone Note Call from Patient Call back at Home Phone [phone]   Caller: Patient Summary of Call: Pt has been feeling dizzy and light headed for about 3 days. Initial call taken by: Clydell Hakim,  March 22, 2009 12:24 PM  Follow-up for Phone Call        left message Follow-up by: Golden Circle RN,  March 22, 2009 12:27 PM  Additional Follow-up for Phone Call Additional follow up Details #1::        states she gets lots of earwax. thinks it may be this. states she is able to walk ok. eating fine. no changes in her life. unable to come until 4:30 today or tomorrrow. we do not have any appts that last. she does not want to wait for friday am when she has off. states she will go to [facility] tonight when she gets off work Additional Follow-up by: Golden Circle RN,  March 22, 2009 2:27 PM

## 2010-04-05 NOTE — Progress Notes (Signed)
----   Converted from flag ---- ---- 11/12/2009 10:20 AM, Helane Rima DO wrote: please call this patient to let her know that she has a yeast infection. i have prescribed diflucan. please call to her preferred pharmacy as she put the [facility] on her yellow sheet, but her chart says another pharmacy. thanks! ------------------------------  called pt informed of yeast infection. called in Rx to [facility] and notified pt to pick up there.

## 2010-04-05 NOTE — Letter (Signed)
Summary: Generic Letter  [facility]  [address]   [place], [place] [address]   Phone: [phone]  Fax: [phone]    12/15/2009  [name] [name] [address] [place], [place]  [address]  Dear Ms. Bellantoni,   I have made several attempts to reach you by phone.  Will you call our office at your convenience to schedule a lab appointment to have your CBC and Iron checked.    Sincerely,   Loralee Pacas CMA

## 2010-04-05 NOTE — Progress Notes (Signed)
Summary: Lab Res   Phone Note Call from Patient Call back at Home Phone [phone]   Caller: Patient Summary of Call: Checking on lab work from last week. Initial call taken by: Clydell Hakim,  December 20, 2009 9:58 AM  Follow-up for Phone Call        will forward to MD. Follow-up by: Theresia Lo RN,  December 20, 2009 10:33 AM  Additional Follow-up for Phone Call Additional follow up Details #1::        Called the patient. Told her that her Hg was 0.1 off of the normal range. Suggested eating foods with iron in them (green leafly vegges and beans) but that we did not need to start an iron supplement at this time. She is to call back if she has any further questions.   Additional Follow-up by: Jamie Brookes MD,  December 20, 2009 1:10 PM

## 2010-04-05 NOTE — Assessment & Plan Note (Signed)
Summary: n/v/d,df   Vital Signs:  Patient profile:   35 year old female Height:      68 inches Weight:      310.4 pounds BMI:     47.37 Temp:     99.6 degrees F oral Pulse rate:   86 / minute BP sitting:   127 / 82  (left arm) Cuff size:   large  Vitals Entered By: Garen Grams LPN (February 01, 2010 10:49 AM) CC: n/v/d, fever x 2 days Is Patient Diabetic? No Pain Assessment Patient in pain? yes     Location: stomach   Primary Khamya Topp:  Jamie Brookes MD  CC:  n/v/d and fever x 2 days.  History of Present Illness: pt reports a one day history of n/v/d and fever.  she states she has upper abdominal pain that is non radiating and crampy in nature along with the onset of vomiting and diarrhea.  pt states she had one episode of vomiting but had several episodes of diarrhea.  she denies any hematemesis or hematochezia.  Tmax 100.4.  she denies any sick contacts although she is a [employer], [employer]/film/video.  however, she states no one is sick at school.  additionally she denies any food contacts, travel, etc.  Preventive Screening-Counseling & Management  Alcohol-Tobacco     Smoking Status: never  Allergies: 1)  ! Imitrex 2)  ! * Zomig 3)  ! Morphine 4)  ! * Dilaudid 5)  ! Demerol 6)  Lisinopril (Lisinopril) 7)  * Oxycodone  Past History:  Risk Factors: Smoking Status: never (02/01/2010)  Past medical, surgical, family and social histories (including risk factors) reviewed, and no changes noted (except as noted below).  Past Medical History: Reviewed history from 08/26/2009 and no changes required. BMI = 48 (5/04), now 52 (2010) FT NSVD X 3, `02, `00, and `98,   h/o cervical dyspl req. cryotherapy `92, nl f/u,  PTL in `98 pregancy  dehydration hospitalization hypertension bipolar disorder.  therapist is Ledon Snare         Past Surgical History: Reviewed history from 04/24/2008 and no changes required. HIDA-neg   EF 34% - 06/20/1999,  pelvic u/s- neg - 01/05/1999 Hysterectomy  2009       Family History: Reviewed history from 08/26/2009 and no changes required. Father with DM.,  MGF - DM and CAD (age 29),  MGM-diabetes, hypertension, hld Mom w/  HTN, DM, severe mental illness.   + Breast CA, cervical CA in aunts, and colon CA.      Social History: Reviewed history from 10/15/2009 and no changes required. Mom (Celestine McKenzie)has severe depression. Lives with kids, works at daycare center, usually attends school at [employer] No tob, drugs; rare ETOH.  tremendous social stressors - single mom with 3 children, mentally ill mother, working and in school.    Physical Exam  General:  Well-developed,well-nourished,in no acute distress; alert,appropriate and cooperative throughout examination Mouth:  MMM moist Lungs:  Normal respiratory effort, chest expands symmetrically. Lungs are clear to auscultation, no crackles or wheezes. Heart:  Normal rate and regular rhythm. S1 and S2 normal without gallop, murmur, click, rub or other extra sounds. Abdomen:  normal bowel sounds, epigastric tenderness to palpation, no rebound, no guarding, no lower abdominal pain, obese   Impression & Recommendations:  Problem # 1:  GASTROENTERITIS, VIRAL, ACUTE (ICD-008.8) Assessment New  Supportive care.  pt does not clinically appear dehydrated.  pt encouraged to consume liquids and electrolytes.  pt given ER warnings.  pt is to  remain out of school/work for the remaining of the week.   Orders: [facility]- Est Level  3 ([address])  Complete Medication List: 1)  Lamictal 100 Mg Tabs (Lamotrigine) .... Take three a day. 2)  Miralax Powd (Polyethylene glycol 3350) .Marland Kitchen.. 17g by mouth once daily as needed constipation, disp bulk powder, qs x 1 month 3)  Premarin 1.25 Mg Tabs (Estrogens conjugated) .... Take one tablet daily 4)  Calcium 600/vitamin D 600-400 Mg-unit Chew (Calcium carbonate-vitamin d) .... Take 1 tablet po two times a day 5)  Ibuprofen 600 Mg Tabs (Ibuprofen) .Marland Kitchen.. 1 tab by mouth three  times a day as needed pain.  take with food 6)  Ferrous Sulfate 325 (65 Fe) Mg Tbec (Ferrous sulfate) .Marland Kitchen.. 1 tab by mouth two times a day for anemia 7)  Venlafaxine Hcl 37.5 Mg Xr24h-cap (Venlafaxine hcl) .... Take 2 tabs per day 8)  Diflucan 150 Mg Tabs (Fluconazole) .... Once daily x 1, then again 1 week later  Patient Instructions: 1)  please make a follow up appointment for 2 weeks.   2)  stay out of work for the remainder of the week.  do not return to work until without any symptoms and fever for 24 hours.  3)  return to the emergency room if with still unable to tolerate by mouth, worsening fever, pain, or any other concerning symptoms.    Orders Added: 1)  [facility]- Est Level  3 [[id number]]

## 2010-04-05 NOTE — Assessment & Plan Note (Signed)
Summary: f/u eo   Vital Signs:  Patient profile:   35 year old female Height:      68 inches Weight:      318.7 pounds BMI:     48.63 Temp:     98.4 degrees F oral Pulse rate:   84 / minute BP sitting:   124 / 86  (left arm) Cuff size:   large  Vitals Entered By: Gladstone Pih (August 30, 2009 3:14 PM) CC: F/U HTN Is Patient Diabetic? No Pain Assessment Patient in pain? no        Primary Care Provider:  Asher Muir MD  CC:  F/U HTN.  History of Present Illness: 1.  hypertension--still not taking diovan, but bp is at goal (124/86).    2.  depression--see previous phone notes.  was started on pristiq, but was gaining wt on pristiq.  her psychologist [facility]), would like her switched to effexor XR.  however, will not be able to get this from [facility] for 4-6 weeks; so needs a script to last her until then.    3.  vit d--took replacement doses.  is finished and currently on nothing.  need to recheck.    4.  anemia--last hgb 10.8.  started iron about 12 weeks ago.  needs recheck  5.  migraines--worse with pristiq.  excedrin migraine used to work; however, does not resolve her migraines since she has been on pristiq  Habits & Providers  Alcohol-Tobacco-Diet     Tobacco Status: never  Current Medications (verified): 1)  Diovan Hct 80-12.5 Mg Tabs (Valsartan-Hydrochlorothiazide) .Marland Kitchen.. 1 Tab By Mouth Daily For Blood Pressure 2)  Lamictal 100 Mg  Tabs (Lamotrigine) .... Take Three A Day. 3)  Miralax   Powd (Polyethylene Glycol 3350) .Marland Kitchen.. 17g By Mouth Once Daily As Needed Constipation, Disp Bulk Powder, Qs X 1 Month 4)  Premarin 1.25 Mg  Tabs (Estrogens Conjugated) .... Take One Tablet Daily 5)  Calcium 600/vitamin D 600-400 Mg-Unit  Chew (Calcium Carbonate-Vitamin D) .... Take 1 Tablet Po Two Times A Day 6)  Ibuprofen 600 Mg Tabs (Ibuprofen) .Marland Kitchen.. 1 Tab By Mouth Three Times A Day As Needed Pain.  Take With Food 7)  Ferrous Sulfate 325 (65 Fe) Mg Tbec (Ferrous Sulfate) .Marland Kitchen.. 1 Tab  By Mouth Two Times A Day For Anemia 8)  Vitamin D (Ergocalciferol) 50000 Unit Caps (Ergocalciferol) .... 2 Caps By Mouth Weekly For 8 Weeks 9)  Effexor Xr 37.5 Mg Xr24h-Cap (Venlafaxine Hcl) .Marland Kitchen.. 1 Tab By Mouth Daily For 2 Weeks; Then 2 Tabs By Mouth Daily.  Dispense Brand Name  Allergies: 1)  ! Imitrex 2)  ! * Zomig 3)  ! Morphine 4)  ! * Dilaudid 5)  ! Demerol 6)  Lisinopril (Lisinopril) 7)  * Oxycodone  Physical Exam  General:  obese female, NAD, vitals reviewed. lwearing sunglasses.  appears tired.   Psych:  appears tired.  poor eye contact today.  seems mildly depressed.   Additional Exam:  vital signs reviewed    Impression & Recommendations:  Problem # 1:  BIPOLAR DISORDER (ICD-296.7) Assessment Deteriorated  gave script for effexor (NOT XR) because of price.  she is to take this until she gets the XR through [facility].  I handwrote the script; so as to save confusion for having 2 effexor doses listed in computer.  see pt instructions.  Orders: [facility]- Est  Level 4 ([address])  Problem # 2:  UNSPECIFIED VITAMIN D DEFICIENCY (ICD-268.9) Assessment: Unchanged recheck today.  in  meantime, start calcium/vit d supplementation daily Orders: Vit D, 25 [facility] ([id number]) [facility]- Est  Level 4 ([address])  Problem # 3:  MIGRAINE, UNSPEC., W/O INTRACTABLE MIGRAINE (ICD-346.90) Assessment: Deteriorated hope that stopping pristiq will help.  if migraines not better off pristiq, make follow up appt Her updated medication list for this problem includes:    Ibuprofen 600 Mg Tabs (Ibuprofen) .Marland Kitchen... 1 tab by mouth three times a day as needed pain.  take with food  Problem # 4:  HYPERTENSION, BENIGN SYSTEMIC (ICD-401.1) actually at goal off of diovan.  will monitor. The following medications were removed from the medication list:    Diovan Hct 80-12.5 Mg Tabs (Valsartan-hydrochlorothiazide) .Marland Kitchen... 1 tab by mouth daily for blood pressure  Orders: T-Basic Metabolic Panel [id number]) [facility]- Est  Level  4 ([address])  BP today: 124/86 Prior BP: 130/92 (07/21/2009)  Labs Reviewed: K+: 4.0 (05/27/2009) Creat: : 0.75 (05/27/2009)   Chol: 219 (04/24/2008)   HDL: 70 (04/24/2008)   LDL: 107 (04/24/2008)   TG: 210 (04/24/2008)  Problem # 5:  ANEMIA, IRON DEFICIENCY, UNSPEC. (ICD-280.9) Assessment: Unchanged recheck cbc on iron Hgb: 12.4 (08/30/2009)   Hct: 33.7 (05/27/2009)   Platelets: 311 (05/27/2009) RBC: 3.93 (05/27/2009)   RDW: 12.6 (05/27/2009)   WBC: 6.8 (05/27/2009) MCV: 85.8 (05/27/2009)   MCHC: 32.0 (05/27/2009) Ferritin: 8 (04/24/2007) TSH: 2.897 (04/24/2007)  Problem # 6:  HYPERLIPIDEMIA (ICD-272.4) Assessment: Unchanged due for ldl check Orders: T-Lipoprotein (LDL cholesterol)  ([id number]) [facility]- Est  Level 4 ([address])  Complete Medication List: 1)  Lamictal 100 Mg Tabs (Lamotrigine) .... Take three a day. 2)  Miralax Powd (Polyethylene glycol 3350) .Marland Kitchen.. 17g by mouth once daily as needed constipation, disp bulk powder, qs x 1 month 3)  Premarin 1.25 Mg Tabs (Estrogens conjugated) .... Take one tablet daily 4)  Calcium 600/vitamin D 600-400 Mg-unit Chew (Calcium carbonate-vitamin d) .... Take 1 tablet po two times a day 5)  Ibuprofen 600 Mg Tabs (Ibuprofen) .Marland Kitchen.. 1 tab by mouth three times a day as needed pain.  take with food 6)  Ferrous Sulfate 325 (65 Fe) Mg Tbec (Ferrous sulfate) .Marland Kitchen.. 1 tab by mouth two times a day for anemia 7)  Vitamin D (ergocalciferol) 50000 Unit Caps (Ergocalciferol) .... 2 caps by mouth weekly for 8 weeks 8)  Effexor Xr 37.5 Mg Xr24h-cap (Venlafaxine hcl) .Marland Kitchen.. 1 tab by mouth daily for 2 weeks; then 2 tabs by mouth daily.  dispense brand name  Other Orders: Hemoglobin-[facility] ([id number])  Patient Instructions: 1)  It was nice to see you today. 2)  I will let you know about your labs. 3)  No blood pressure meds for now. 4)  Start taking calcium/vitamin D.  Take 800 units of vitamin D a day.   5)  (try viactiv one chew two times a day) 6)  If your migraines  don't get better when you go off pristiq, make a follow up appointment. 7)  ****Until you get your effexor XR from [facility], take the following:  "regular" effexor 75mg  tab.  Take 1/2 tab by mouth two times a day for 2 weeks.  Then take 1 full tab two times a day until you get the XR. 8)  Please schedule a follow-up appointment in 3-4 weeks.    Prevention & Chronic Care Immunizations   Influenza vaccine: Fluvax 3+  (12/20/2006)   Influenza vaccine due: 12/20/2007    Tetanus booster: 02/12/2009: Tdap   Tetanus booster due: 07/04/2009    Pneumococcal vaccine: Not documented  Other Screening   Pap smear: had hysterectomy and oophrectomy  (04/24/2007)   Pap smear action/deferral: Not indicated S/P hysterectomy  (10/07/2008)   Pap smear due: 04/23/2008   Smoking status: never  (08/30/2009)  Lipids   Total Cholesterol: 219  (04/24/2008)   Lipid panel action/deferral: LDL Direct Ordered   LDL: 161  (04/24/2008)   LDL Direct: Not documented   HDL: 70  (04/24/2008)   Triglycerides: 210  (04/24/2008)    SGOT (AST): 13  (04/24/2007)   SGPT (ALT): 10  (04/24/2007)   Alkaline phosphatase: 60  (04/24/2007)   Total bilirubin: 0.3  (04/24/2007)    Lipid flowsheet reviewed?: Yes   Progress toward LDL goal: At goal  Hypertension   Last Blood Pressure: 124 / 86  (08/30/2009)   Serum creatinine: 0.75  (05/27/2009)   BMP action: Ordered   Serum potassium 4.0  (05/27/2009)    Hypertension flowsheet reviewed?: Yes   Progress toward BP goal: At goal  Self-Management Support :    Hypertension self-management support: BP self-monitoring log, Written self-care plan, Education handout  (07/21/2009)    Lipid self-management support: Not documented   Laboratory Results   Blood Tests   Date/Time Received: August 30, 2009 4:14 PM  Date/Time Reported: August 30, 2009 4:34 PM     CBC   HGB:  12.4 g/dL   (Normal Range: 09.6-04.5 in Males, 12.0-15.0 in Females) Comments: venous  sample ...............test performed by......Marland KitchenBonnie A. Swaz[place], MLS (ASCP)cm

## 2010-04-05 NOTE — Progress Notes (Signed)
Summary: Rx Ques  Medications Added VITAMIN D (ERGOCALCIFEROL) 50000 UNIT CAPS (ERGOCALCIFEROL) 2 caps by mouth weekly for 8 weeks       Phone Note Call from Patient Call back at [facility] Phone [phone]   Caller: Patient Summary of Call: Please call pt concerning the Vitamin D rx she was to get.  Questions about powder vs tabs and also dosage. Initial call taken by: Clydell Hakim,  June 16, 2009 1:37 PM  Follow-up for Phone Call        spoke with patient . MD sent RX for powder and then stated tablets to take in directions. will forward to MD to please clarify. [facility] Follow-up by: Theresia Lo RN,  June 16, 2009 2:05 PM  Additional Follow-up for Phone Call Additional follow up Details #1::        I meant to do tabs/caps.  changed it in [facility].  would you mind calling it in for her.  thanks Additional Follow-up by: Asher Muir MD,  June 16, 2009 2:23 PM    New/Updated Medications: VITAMIN D (ERGOCALCIFEROL) 50000 UNIT CAPS (ERGOCALCIFEROL) 2 caps by mouth weekly for 8 weeks Prescriptions: VITAMIN D (ERGOCALCIFEROL) 50000 UNIT CAPS (ERGOCALCIFEROL) 2 caps by mouth weekly for 8 weeks  #16 x 0   Entered and Authorized by:   Asher Muir MD   Signed by:   Asher Muir MD on 06/16/2009   Method used:   Electronically to        [facility] (retail)       [address]       [place], [place]  [id number]       Ph: [phone]       Fax: [phone]   RxID:   [id number]  patient notified and rx cancelled at [facility]. Theresia Lo RN  June 16, 2009 3:44 PM

## 2010-04-05 NOTE — Assessment & Plan Note (Signed)
Summary: ear/jaw pain,df   Vital Signs:  Patient profile:   35 year old female Weight:      319.3 pounds Pulse rate:   80 / minute BP sitting:   125 / 80  (left arm)  Vitals Entered By: Arlyss Repress CMA, (October 15, 2009 3:51 PM) CC: Rt jaw pain/Rt ear pain Pain Assessment Patient in pain? yes     Location: ear Intensity: 1 Onset of pain  x2wks   Primary Care Provider:  Jamie Brookes MD  CC:  Rt jaw pain/Rt ear pain.  History of Present Illness: Rt Jaw/Rt ear/Rt neck pain: Pt has had some Rt ear and neck pain x 2 week. Pt has not been having any fevers or discharge from the ear. She does have jaw pain that restricts her ability to open her mouth. She says chewing and yawning cause a sharp pain but at baseline she just has a dull ache all the time. She is not on bisphosphonates, no recent URI, no fevers or chills, no nasal drainage. No other symptoms related.   Habits & Providers  Alcohol-Tobacco-Diet     Tobacco Status: never  Current Medications (verified): 1)  Lamictal 100 Mg  Tabs (Lamotrigine) .... Take Three A Day. 2)  Miralax   Powd (Polyethylene Glycol 3350) .Marland Kitchen.. 17g By Mouth Once Daily As Needed Constipation, Disp Bulk Powder, Qs X 1 Month 3)  Premarin 1.25 Mg  Tabs (Estrogens Conjugated) .... Take One Tablet Daily 4)  Calcium 600/vitamin D 600-400 Mg-Unit  Chew (Calcium Carbonate-Vitamin D) .... Take 1 Tablet Po Two Times A Day 5)  Ibuprofen 600 Mg Tabs (Ibuprofen) .Marland Kitchen.. 1 Tab By Mouth Three Times A Day As Needed Pain.  Take With Food 6)  Ferrous Sulfate 325 (65 Fe) Mg Tbec (Ferrous Sulfate) .Marland Kitchen.. 1 Tab By Mouth Two Times A Day For Anemia 7)  Venlafaxine Hcl 37.5 Mg Xr24h-Cap (Venlafaxine Hcl) .... Take 2 Tabs Per Day 8)  Ketoconazole 2 % Crea (Ketoconazole) .... Apply To Affecteds Skin Bid X 2 Weeks or Until 5 Days After Rash Disappears.  Give 1 Large Tube  Allergies (verified): 1)  ! Imitrex 2)  ! * Zomig 3)  ! Morphine 4)  ! * Dilaudid 5)  ! 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 Lives with kids, works at daycare center, usually attends school at [employer] No tob, drugs; rare ETOH.  tremendous social stressors - single mom with 3 children, mentally ill mother, working and in school.    Review of Systems        vitals reviewed and pertinent negatives and positives seen in HPI   Physical Exam  General:  Well-developed,well-nourished,in no acute distress; alert,appropriate and cooperative throughout examination Head:  Normocephalic and atraumatic without obvious abnormalities. No apparent alopecia or balding. Ears:  External ear exam shows no significant lesions or deformities.  Otoscopic examination reveals clear canals, tympanic membranes are intact bilaterally without bulging, retraction, inflammation or discharge. Hearing is grossly normal bilaterally. Nose:  External nasal examination shows no deformity or inflammation. Nasal mucosa are pink and moist without lesions or exudates. Mouth:  pt can not open mouth to full extent b/c of pain. She has not visualized areas of erythema or mouth abnormalities.  Neck:  tender along SCM but not over bony prominences Lungs:  Normal respiratory effort, chest expands symmetrically. Lungs are clear to auscultation, no crackles or wheezes. Heart:  Normal rate and regular rhythm.  S1 and S2 normal without gallop, murmur, click, rub or other extra sounds.   Impression & Recommendations:  Problem # 1:  JAW PAIN (ICD-526.9) Assessment New Pt is having some Rt ear, neck and jaw pain that she says does not hurt her all the time (maybe a dull ache but not sharp pain) unless she tries to open her mouth wide. She has been eating soft foods. Plan to get Panarex of mouth to see if there is visualized boney abnormality. Will also refer to ENT as the patient can not open her mouth very wide and all her mouth can not be visualized. Would consider  migraine as a cause in this patient if pain continues on that side but jaw movement returns to normal.   Orders: Radiology other (Radiology Other) ENT Referral (ENT) [facility]- Est Level  3 ([address])  Complete Medication List: 1)  Lamictal 100 Mg Tabs (Lamotrigine) .... Take three a day. 2)  Miralax Powd (Polyethylene glycol 3350) .Marland Kitchen.. 17g by mouth once daily as needed constipation, disp bulk powder, qs x 1 month 3)  Premarin 1.25 Mg Tabs (Estrogens conjugated) .... Take one tablet daily 4)  Calcium 600/vitamin D 600-400 Mg-unit Chew (Calcium carbonate-vitamin d) .... Take 1 tablet po two times a day 5)  Ibuprofen 600 Mg Tabs (Ibuprofen) .Marland Kitchen.. 1 tab by mouth three times a day as needed pain.  take with food 6)  Ferrous Sulfate 325 (65 Fe) Mg Tbec (Ferrous sulfate) .Marland Kitchen.. 1 tab by mouth two times a day for anemia 7)  Venlafaxine Hcl 37.5 Mg Xr24h-cap (Venlafaxine hcl) .... Take 2 tabs per day 8)  Ketoconazole 2 % Crea (Ketoconazole) .... Apply to affecteds skin bid x 2 weeks or until 5 days after rash disappears.  give 1 large tube  Patient Instructions: 1)  Go get the xray for the teeth tomorrow.  2)  We are going to set up the ENT referral and call you with the info.

## 2010-04-05 NOTE — Progress Notes (Signed)
Summary: Rx Prob   Phone Note Call from Patient Call back at [facility] Phone [phone]   Caller: Patient Summary of Call: Needs an rx for generic form of Effexor extended release.  Pharmacy [facility] Dept. Initial call taken by: Clydell Hakim,  September 02, 2009 3:11 PM  Follow-up for Phone Call        spoke with patient and advised that when RN spoke with Dr. Ledon Snare on 08/20/2009 he recommended Effexor XR brand name only. ( see note ) She will call Dr. Ledon Snare and have him call us  to verify  that generic is ok. Follow-up by: Theresia Lo RN,  September 02, 2009 3:34 PM  Additional Follow-up for Phone Call Additional follow up Details #1::        sounds good.  Additional Follow-up by: Jamie Brookes MD,  September 03, 2009 5:55 PM     Appended Document: Rx Prob and refill Medications Added VENLAFAXINE HCL 37.5 MG XR24H-CAP (VENLAFAXINE HCL) take 2 tabs per day        Have not heard from Dr. Ledon Snare. Message left on patient's voicemail to call me back. Theresia Lo RN  September 07, 2009 10:40 AM   received call from Dr. Ledon Snare and he states generic Effexor XR will be fine. will send to Dr. Clotilde Dieter for her to [facility] this also. then will call [facility] pharmacy. Theresia Lo RN  September 07, 2009 11:17 AM  Clinical Lists Changes  Medications: Changed medication from [facility] XR 37.5 MG XR24H-CAP (VENLAFAXINE HCL) 1 tab by mouth daily for 2 weeks; then 2 tabs by mouth daily.  dispense brand name to VENLAFAXINE HCL 37.5 MG XR24H-CAP (VENLAFAXINE HCL) take 2 tabs per day - Signed Rx of VENLAFAXINE HCL 37.5 MG XR24H-CAP (VENLAFAXINE HCL) take 2 tabs per day;  #60 x 4;  Signed;  Entered by: Jamie Brookes MD;  Authorized by: Jamie Brookes MD;  Method used: Electronically to [facility] [address], [place], [place]  [id number], Ph: [phone], Fax: [phone]    Prescriptions: VENLAFAXINE HCL 37.5 MG XR24H-CAP (VENLAFAXINE HCL) take 2  tabs per day  #60 x 4   Entered and Authorized by:   Jamie Brookes MD   Signed by:   Jamie Brookes MD on 09/07/2009   Method used:   Electronically to        [facility]* (retail)       [address]       [place], [place]  [id number]       Ph: [phone]       Fax: [phone]   RxID:   [id number]    Appended Document: Rx Prob: sending Rx to [facility]  Pt wanted it sent to [facility] instead of [facility]. Sending it now. Jamie Brookes MD  September 08, 2009 9:05 AM    Clinical Lists Changes  Medications: Rx of VENLAFAXINE HCL 37.5 MG XR24H-CAP (VENLAFAXINE HCL) take 2 tabs per day;  #62 x 11;  Signed;  Entered by: Jamie Brookes MD;  Authorized by: Jamie Brookes MD;  Method used: Faxed to [facility], [address] [place], [place], [place]  [address], Ph: [phone], Fax: [phone]    Prescriptions: VENLAFAXINE HCL 37.5 MG XR24H-CAP (VENLAFAXINE HCL) take 2 tabs per day  #62 x 11   Entered and Authorized by:   Jamie Brookes MD   Signed by:   Jamie Brookes MD on 09/08/2009   Method  used:   Faxed to ...       [facility] Department (retail)       [address] [place], [place]  [address]       Ph: [phone]       Fax: [phone]   RxID:   [id number]

## 2010-04-05 NOTE — Progress Notes (Signed)
  Medications Added CIPROFLOXACIN HCL 500 MG TABS (CIPROFLOXACIN HCL) 1 tab by mouth two times a day for 10 days VITAMIN D3 100000 UNIT/GM POWD (CHOLECALCIFEROL) 5 tabs by mouth weekly for 8 weeks FERROUS SULFATE 325 (65 FE) MG TBEC (FERROUS SULFATE) 1 tab by mouth two times a day for anemia       Phone Note Outgoing Call   Call placed by: Asher Muir MD,  May 28, 2009 11:52 AM Summary of Call: called pt and gave results.  cxr concerning for atypical pna.  cipro only quinolone availabe at health dept pharmacy; so will choose this.  also low vitamin d.  will restart repletion.  anemic again; she stopped iron when her hgb improved.  will fax scripts to health dept pharmacy.  pt instructed to make f/u appt in clinic if not feeling better and afebrile by Monday.  told to have low threshold for going to urgent care/ER if worsens over the weekend Initial call taken by: Asher Muir MD,  May 28, 2009 11:54 AM    New/Updated Medications: CIPROFLOXACIN HCL 500 MG TABS (CIPROFLOXACIN HCL) 1 tab by mouth two times a day for 10 days VITAMIN D3 100000 UNIT/GM POWD (CHOLECALCIFEROL) 5 tabs by mouth weekly for 8 weeks FERROUS SULFATE 325 (65 FE) MG TBEC (FERROUS SULFATE) 1 tab by mouth two times a day for anemia Prescriptions: CIPROFLOXACIN HCL 500 MG TABS (CIPROFLOXACIN HCL) 1 tab by mouth two times a day for 10 days  #20 x 0   Entered and Authorized by:   Asher Muir MD   Signed by:   Asher Muir MD on 05/28/2009   Method used:   Electronically to        [facility] [address] (retail)       [phone]       [place], [place]  [address]       Ph: [phone]       Fax: [phone]   RxID:   [id number] FERROUS SULFATE 325 (65 FE) MG TBEC (FERROUS SULFATE) 1 tab by mouth two times a day for anemia  #60 x 12   Entered and Authorized by:   Asher Muir MD   Signed by:   Asher Muir MD on 05/28/2009   Method used:   Print then  Give to Patient   RxID:   [id number] VITAMIN D3 100000 UNIT/GM POWD (CHOLECALCIFEROL) 5 tabs by mouth weekly for 8 weeks  #40 x 0   Entered and Authorized by:   Asher Muir MD   Signed by:   Asher Muir MD on 05/28/2009   Method used:   Print then Give to Patient   RxID:   [id number] CIPROFLOXACIN HCL 500 MG TABS (CIPROFLOXACIN HCL) 1 tab by mouth two times a day for 10 days  #20 x 0   Entered and Authorized by:   Asher Muir MD   Signed by:   Asher Muir MD on 05/28/2009   Method used:   Print then Give to Patient   RxID:   [id number]

## 2010-04-05 NOTE — Progress Notes (Signed)
Summary: Rx Req   Phone Note Call from Patient Call back at [facility] Phone [phone]   Caller: Patient Summary of Call: Pt says that a cream was to be called in for her for a fungal skin rash.  Pharmacy [facility] Dept. Initial call taken by: Clydell Hakim,  October 07, 2009 2:36 PM  Follow-up for Phone Call        It waas sent to [facility] pharmacy so I changed it and sent it to [facility] instead. Sorry about the mix up. Please let her know to pick it up tomorrow.  Follow-up by: Jamie Brookes MD,  October 07, 2009 9:26 PM    Prescriptions: KETOCONAZOLE 2 % CREA (KETOCONAZOLE) apply to affecteds skin BId x 2 weeks or until 5 days after rash disappears.  Give 1 large tube  #1 x 1   Entered and Authorized by:   Jamie Brookes MD   Signed by:   Jamie Brookes MD on 10/07/2009   Method used:   Faxed to ...       [facility] Department (retail)       [address] [place], [place]  [address]       Ph: [phone]       Fax: [phone]   RxID:   [id number]

## 2010-04-05 NOTE — Letter (Signed)
Summary: Generic Letter  [facility]  [address]   [place], [place] [address]   Phone: [phone]  Fax: [phone]    08/31/2009  TARINI CARRIER [address] [place], [place]  [address]  Dear Ms. Reason,   I just wanted to let you know that your lab results were normal.  Your anemia is much better; so keep taking your iron.  Your vitamin D is now in the normal range.  Make sure to take the calcium/vitamin D supplements.  Your cholesterol is higher than it was, but not high enough to start a medicine.  Continuing to try and lose weight will help with that.  Please call me if you have any questions or concerns.           Sincerely,   Asher Muir MD  Appended Document: Generic Letter mailed

## 2010-04-05 NOTE — Progress Notes (Signed)
Summary: phn msg  Medications Added DIOVAN HCT 80-12.5 MG TABS (VALSARTAN-HYDROCHLOROTHIAZIDE) 1 tab by mouth daily for blood pressure PRISTIQ 50 MG XR24H-TAB (DESVENLAFAXINE SUCCINATE) 1 tab by mouth daily for depression       Phone Note Call from Patient Call back at Home Phone [phone]   Caller: Patient Summary of Call: Pt said that there was to be a new rx that was to be sent in for her depression because she is going off Cymbalta.  The health dept can no longer get the Hyzarr they can get Diovan.  Her bp is up today.  Can we please call her when this is straightened out pt is very confused about everything. Initial call taken by: Clydell Hakim,  Jul 22, 2009 8:41 AM  Follow-up for Phone Call        to pcp Follow-up by: Gladstone Pih,  Jul 22, 2009 8:48 AM  Additional Follow-up for Phone Call Additional follow up Details #1::        Dr. Ledon Snare has not yet called me yet about his thoughts on her antidepressant.  I will call him at [phone].    in the meantime, she will call for refill on the cymbalta until a decision is made.    will change hyzaar to diovan. Additional Follow-up by: Asher Muir MD,  Jul 22, 2009 12:41 PM    Additional Follow-up for Phone Call Additional follow up Details #2::    Talke with Dr. Ledon Snare, who thought pristiq might be a good choice for her.  I think that is fine.  Not sure she can get that through the [facility] program  called pt and told her plan  I will fax script over for pristiq to [facility]  she will call me if they are not ablet to get it to her  she should take cymbalta until able to get pristiq, but she should not overlap the meds Follow-up by: Asher Muir MD,  Jul 22, 2009 1:51 PM  New/Updated Medications: DIOVAN HCT 80-12.5 MG TABS (VALSARTAN-HYDROCHLOROTHIAZIDE) 1 tab by mouth daily for blood pressure PRISTIQ 50 MG XR24H-TAB (DESVENLAFAXINE SUCCINATE) 1 tab by mouth daily for  depression Prescriptions: PRISTIQ 50 MG XR24H-TAB (DESVENLAFAXINE SUCCINATE) 1 tab by mouth daily for depression  #30 x 6   Entered and Authorized by:   Asher Muir MD   Signed by:   Asher Muir MD on 07/22/2009   Method used:   Printed then faxed to ...       [facility] (retail)       [address]       [place], [place]  [id number]       Ph: [phone]       Fax: [phone]   RxID:   [id number] DIOVAN HCT 80-12.5 MG TABS (VALSARTAN-HYDROCHLOROTHIAZIDE) 1 tab by mouth daily for blood pressure  #30 x 3   Entered and Authorized by:   Asher Muir MD   Signed by:   Asher Muir MD on 07/22/2009   Method used:   Printed then faxed to ...       [facility] (retail)       [address]       [place], [place]  [id number]       Ph: [phone]       Fax: [phone]   RxID:   [id number]

## 2010-04-05 NOTE — Progress Notes (Signed)
Summary: phn msg: Iron studies   Phone Note Call from Patient Call back at Home Phone [phone]   Caller: Patient Summary of Call: been taking iron and calcium and still is very tired and exhausted still- wants to know if she can get her iron levels checked Initial call taken by: De Nurse,  December 14, 2009 4:34 PM  Follow-up for Phone Call        forward to pcp Follow-up by: Loralee Pacas CMA,  December 14, 2009 4:43 PM  Additional Follow-up for Phone Call Additional follow up Details #1::        I have ordered the labs. SHe can come get them done any time she has time to do it. Thanks.  Additional Follow-up by: Jamie Brookes MD,  December 14, 2009 5:34 PM     Appended Document: phn msg: Iron studies lvm for pt to return call

## 2010-04-05 NOTE — Miscellaneous (Signed)
Summary: patient summary   Clinical Lists Changes  Mom is Insurance risk surveyor and grandmother is Alexander Bergeron    Problems: Changed problem from HIATAL HERNIA (ICD-553.3) to History of  HIATAL HERNIA (ICD-553.3) Changed problem from PEPTIC ULCER DIS., UNSPEC. W/O OBSTRUCTION (ICD-533.90) to History of  PEPTIC ULCER DIS., UNSPEC. W/O OBSTRUCTION (ICD-533.90) Removed problem of GERD (ICD-530.81) Assessed BIPOLAR DISORDER as comment only - on effexor and lamictal.  her therapist Ledon Snare), will sometimes call and make medication suggestion.  I typically agree with what he recommends and write the script.   Assessed ANXIETY as comment only - see above discussion Her updated medication list for this problem includes:    Effexor Xr 37.5 Mg Xr24h-cap (Venlafaxine hcl) .Marland Kitchen... 1 tab by mouth daily for 2 weeks; then 2 tabs by mouth daily.  dispense brand name  Assessed INTERSTITIAL CYSTITIS as comment only - we have not discussed this issue much.  she was followed by dr Vonita Moss at [facility] urology, but has not seen him in a long time.  not currently on any treatment Assessed UNSPECIFIED VITAMIN D DEFICIENCY as comment only - receiving high dose replacement that she should have recently finished.  is due for recheck of vit d now.  has been really low and needs to be on some kind of chronic supplementation Assessed GERD as comment only -   Assessed MENOPAUSE, SURGICAL as comment only -  Her updated medication list for this problem includes:    Premarin 1.25 Mg Tabs (Estrogens conjugated) .Marland Kitchen... Take one tablet daily  Assessed MIGRAINE, UNSPEC., W/O INTRACTABLE MIGRAINE as comment only -  Her updated medication list for this problem includes:    Ibuprofen 600 Mg Tabs (Ibuprofen) .Marland Kitchen... 1 tab by mouth three times a day as needed pain.  take with food  Assessed HYPERTENSION, BENIGN SYSTEMIC as comment only -  Her updated medication list for this problem includes:    Diovan Hct 80-12.5 Mg Tabs  (Valsartan-hydrochlorothiazide) .Marland Kitchen... 1 tab by mouth daily for blood pressure  Prior BP: 130/92 (07/21/2009)  Labs Reviewed: K+: 4.0 (05/27/2009) Creat: : 0.75 (05/27/2009)   Chol: 219 (04/24/2008)   HDL: 70 (04/24/2008)   LDL: 107 (04/24/2008)   TG: 210 (04/24/2008)  Assessed ANEMIA, IRON DEFICIENCY, UNSPEC. as comment only -  Her updated medication list for this problem includes:    Ferrous Sulfate 325 (65 Fe) Mg Tbec (Ferrous sulfate) .Marland Kitchen... 1 tab by mouth two times a day for anemia  Hgb: 10.8 (05/27/2009)   Hct: 33.7 (05/27/2009)   Platelets: 311 (05/27/2009) RBC: 3.93 (05/27/2009)   RDW: 12.6 (05/27/2009)   WBC: 6.8 (05/27/2009) MCV: 85.8 (05/27/2009)   MCHC: 32.0 (05/27/2009) Ferritin: 8 (04/24/2007) TSH: 2.897 (04/24/2007)  Observations: Added new observation of SOCIAL HX: Mom (Celestine McKenzie)has severe depression. Lives with kids, works at daycare center, usually attends school at [employer] No tob, drugs; rare ETOH.  ;remendous social stressors - single mom with 3 children, mentally ill mother, working and in school.   (08/26/2009 13:52) Added new observation of FAMILY HX: Father with DM.,  MGF - DM and CAD (age 44),  MGM-diabetes, hypertension, hld Mom w/  HTN, DM, severe mental illness.   + Breast CA, cervical CA in aunts, and colon CA.     (08/26/2009 13:52) Added new observation of PSH REVIEWED: reviewed - no changes required (08/26/2009 13:52) Added new observation of PAST MED HX: BMI = 48 (5/04), now 52 (2010) FT NSVD X 3, `02, `00, and `98,   h/o cervical  dyspl req. cryotherapy `92, nl f/u,  PTL in `98 pregancy  dehydration hospitalization hypertension bipolar disorder.  therapist is McKnight        (08/26/2009 13:52)      Impression & Recommendations:  Problem # 1:  BIPOLAR DISORDER (ICD-296.7) Assessment Comment Only on effexor and lamictal.  her therapist Ledon Snare), will sometimes call and make medication suggestion.  I typically agree with what he  recommends and write the script.    Problem # 2:  ANXIETY (ICD-300.00) Assessment: Comment Only see above discussion Her updated medication list for this problem includes:    Effexor Xr 37.5 Mg Xr24h-cap (Venlafaxine hcl) .Marland Kitchen... 1 tab by mouth daily for 2 weeks; then 2 tabs by mouth daily.  dispense brand name  Problem # 3:  INTERSTITIAL CYSTITIS (ICD-595.1) Assessment: Comment Only we have not discussed this issue much.  she was followed by dr Vonita Moss at [facility] urology, but has not seen him in a long time.  not currently on any treatment  Problem # 4:  UNSPECIFIED VITAMIN D DEFICIENCY (ICD-268.9) Assessment: Comment Only receiving high dose replacement that she should have recently finished.  is due for recheck of vit d now.  has been really low and needs to be on some kind of chronic supplementation  Problem # 5:  MENOPAUSE, SURGICAL (ICD-627.4) Assessment: Comment Only  Her updated medication list for this problem includes:    Premarin 1.25 Mg Tabs (Estrogens conjugated) .Marland Kitchen... Take one tablet daily  Problem # 6:  MIGRAINE, UNSPEC., W/O INTRACTABLE MIGRAINE (ICD-346.90) Assessment: Comment Only  Her updated medication list for this problem includes:    Ibuprofen 600 Mg Tabs (Ibuprofen) .Marland Kitchen... 1 tab by mouth three times a day as needed pain.  take with food  Problem # 7:  HYPERTENSION, BENIGN SYSTEMIC (ICD-401.1) Assessment: Comment Only  Her updated medication list for this problem includes:    Diovan Hct 80-12.5 Mg Tabs (Valsartan-hydrochlorothiazide) .Marland Kitchen... 1 tab by mouth daily for blood pressure  Prior BP: 130/92 (07/21/2009)  Labs Reviewed: K+: 4.0 (05/27/2009) Creat: : 0.75 (05/27/2009)   Chol: 219 (04/24/2008)   HDL: 70 (04/24/2008)   LDL: 107 (04/24/2008)   TG: 210 (04/24/2008)  Problem # 8:  ANEMIA, IRON DEFICIENCY, UNSPEC. (ICD-280.9) Assessment: Comment Only  Her updated medication list for this problem includes:    Ferrous Sulfate 325 (65 Fe) Mg Tbec (Ferrous  sulfate) .Marland Kitchen... 1 tab by mouth two times a day for anemia  Hgb: 10.8 (05/27/2009)   Hct: 33.7 (05/27/2009)   Platelets: 311 (05/27/2009) RBC: 3.93 (05/27/2009)   RDW: 12.6 (05/27/2009)   WBC: 6.8 (05/27/2009) MCV: 85.8 (05/27/2009)   MCHC: 32.0 (05/27/2009) Ferritin: 8 (04/24/2007) TSH: 2.897 (04/24/2007)  Complete Medication List: 1)  Diovan Hct 80-12.5 Mg Tabs (Valsartan-hydrochlorothiazide) .Marland Kitchen.. 1 tab by mouth daily for blood pressure 2)  Lamictal 100 Mg Tabs (Lamotrigine) .... Take three a day. 3)  Miralax Powd (Polyethylene glycol 3350) .Marland Kitchen.. 17g by mouth once daily as needed constipation, disp bulk powder, qs x 1 month 4)  Premarin 1.25 Mg Tabs (Estrogens conjugated) .... Take one tablet daily 5)  Calcium 600/vitamin D 600-400 Mg-unit Chew (Calcium carbonate-vitamin d) .... Take 1 tablet po two times a day 6)  Ibuprofen 600 Mg Tabs (Ibuprofen) .Marland Kitchen.. 1 tab by mouth three times a day as needed pain.  take with food 7)  Ferrous Sulfate 325 (65 Fe) Mg Tbec (Ferrous sulfate) .Marland Kitchen.. 1 tab by mouth two times a day for anemia 8)  Vitamin D (ergocalciferol)  50000 Unit Caps (Ergocalciferol) .... 2 caps by mouth weekly for 8 weeks 9)  Effexor Xr 37.5 Mg Xr24h-cap (Venlafaxine hcl) .Marland Kitchen.. 1 tab by mouth daily for 2 weeks; then 2 tabs by mouth daily.  dispense brand name   Past History:  Past Medical History: BMI = 48 (5/04), now 52 (2010) FT NSVD X 3, `02, `00, and `98,   h/o cervical dyspl req. cryotherapy `92, nl f/u,  PTL in `98 pregancy  dehydration hospitalization hypertension bipolar disorder.  therapist is Ledon Snare         Past Surgical History: Reviewed history from 04/24/2008 and no changes required. HIDA-neg   EF 34% - 06/20/1999,  pelvic u/s- neg - 01/05/1999 Hysterectomy 2009        Family History: Father with DM.,  MGF - DM and CAD (age 24),  MGM-diabetes, hypertension, hld Mom w/  HTN, DM, severe mental illness.   + Breast CA, cervical CA in aunts, and colon CA.       Social History: Mom Paediatric nurse McKenzie)has severe depression. Lives with kids, works at daycare center, usually attends school at [employer] No tob, drugs; rare ETOH.  ;remendous social stressors - single mom with 3 children, mentally ill mother, working and in school.

## 2010-04-05 NOTE — Progress Notes (Signed)
   Phone Note Call from Patient   Caller: Patient Summary of Call: Please call patient back.  Returned your call.   Initial call taken by: Britta Mccreedy mcgregor  Follow-up for Phone Call        I read her the md message & told her someone will notify her about when & where the PT will be Follow-up by: Golden Circle RN,  November 16, 2009 3:19 PM  Additional Follow-up for Phone Call Additional follow up Details #1::        patient was advised by Loralee Pacas CMA yesterday that her referral has been sent and PT will call her with the appointment time. Additional Follow-up by: Theresia Lo RN,  November 16, 2009 4:11 PM

## 2010-04-05 NOTE — Assessment & Plan Note (Signed)
Summary: f/up,tcb   Vital Signs:  Patient profile:   35 year old female Height:      68 inches Weight:      313 pounds BMI:     47.76 Temp:     98.6 degrees F oral BP sitting:   130 / 92  (left arm) Cuff size:   large  Vitals Entered By: Tessie Fass CMA (Jul 21, 2009 8:58 AM) CC: F/U Is Patient Diabetic? No Pain Assessment Patient in pain? no        Primary Care Provider:  Asher Muir MD  CC:  F/U.  History of Present Illness: 1.  bloood pressure--not on hyzaar past year.  diastolic has been high  2.  depression--increased dose of cymbalta improved her mood, but side effect of drowsiness was a problem.  she is going to therapist today and they will talk about options.  3.  fingers--bilaterally 5th fingers.  pip joints swollen intermittently.  pain, only in pip joint.  no erythema.  started 2-3 weeks ago.  movement is ok.  no trauma or overuse that she can think of.  4.  vitamin d--on 50,000  weeklysince end of march.    5.  hot flashes--is always hot.  other people have noticed this.  she is interested in going up on her premarin if possible  Habits & Providers  Alcohol-Tobacco-Diet     Tobacco Status: never  Current Medications (verified): 1)  Hyzaar 100-25 Mg Tabs (Losartan Potassium-Hctz) .... Take 1 Tablet By Mouth Once A Day 2)  Lamictal 100 Mg  Tabs (Lamotrigine) .... Take Three A Day. 3)  Miralax   Powd (Polyethylene Glycol 3350) .Marland Kitchen.. 17g By Mouth Once Daily As Needed Constipation, Disp Bulk Powder, Qs X 1 Month 4)  Premarin 1.25 Mg  Tabs (Estrogens Conjugated) .... Take One Tablet Daily 5)  Calcium 600/vitamin D 600-400 Mg-Unit  Chew (Calcium Carbonate-Vitamin D) .... Take 1 Tablet Po Two Times A Day 6)  Cymbalta 60 Mg Cpep (Duloxetine Hcl) .Marland Kitchen.. 1 Tab By Mouth Daily For Depression 7)  Cymbalta 30 Mg Cpep (Duloxetine Hcl) .Marland Kitchen.. 1 Tab By Mouth Daily For Depression 8)  Ibuprofen 600 Mg Tabs (Ibuprofen) .Marland Kitchen.. 1 Tab By Mouth Three Times A Day As Needed Pain.   Take With Food 9)  Ferrous Sulfate 325 (65 Fe) Mg Tbec (Ferrous Sulfate) .Marland Kitchen.. 1 Tab By Mouth Two Times A Day For Anemia 10)  Vitamin D (Ergocalciferol) 50000 Unit Caps (Ergocalciferol) .... 2 Caps By Mouth Weekly For 8 Weeks  Allergies: 1)  ! Imitrex 2)  ! * Zomig 3)  ! Morphine 4)  ! * Dilaudid 5)  ! Demerol 6)  Lisinopril (Lisinopril) 7)  * Oxycodone  Review of Systems  The patient denies fever, weight loss, chest pain, and dyspnea on exertion.   General:  Denies loss of appetite; intentional wt loss. MS:  Complains of joint pain and joint swelling; denies joint redness and loss of strength. Psych:  Complains of anxiety, depression, and irritability; denies suicidal thoughts/plans and thoughts of violence.  Physical Exam  General:  obese female, NAD, vitals reviewed. low grade fever.  Lungs:  Normal respiratory effort, chest expands symmetrically. Lungs are clear to auscultation, no crackles or wheezes. Heart:  Normal rate and regular rhythm. S1 and S2 normal without gallop, murmur, click, rub or other extra sounds. Msk:  hands:  I cannot appreciate swelling or redness of 5th digit; however, I do palpate what may be a minor bony deformity of  lateral pip joint bilaterally.  normal rom.  slightly decreased strength for flexion of 5th digit on left side.   Psych:  normally interactive, good eye contact, and not anxious appearing.   Additional Exam:  vital signs reviewed    Impression & Recommendations:  Problem # 1:  UNSPECIFIED VITAMIN D DEFICIENCY (ICD-268.9) Assessment Unchanged plan to recheck vit d at end of june  Problem # 2:  HYPERTENSION, BENIGN SYSTEMIC (ICD-401.1) Assessment: Deteriorated  she is above goal, but on no meds.  at last visit, she was actually at goal off of meds.  I doubt she needs 100-25 dose of hyzaar.  will change to 50-12.5.  plan to recheck bmet at next visit in june. Her updated medication list for this problem includes:    Hyzaar 50-12.5 Mg Tabs  (Losartan potassium-hctz) .Marland Kitchen... 1 tab by mouth daily for blood pressure  Orders: [facility]- Est  Level 4 (99214)  Problem # 3:  MENOPAUSE, SURGICAL (ICD-627.4) Assessment: Unchanged  on max dose of premarin.  wt loss might help some with hot flashes Her updated medication list for this problem includes:    Premarin 1.25 Mg Tabs (Estrogens conjugated) .Marland Kitchen... Take one tablet daily  Orders: [facility]- Est  Level 4 (99214)  Problem # 4:  BIPOLAR DISORDER (ICD-296.7) Assessment: Unchanged  I will await Dr Dewitt Hoes call to discuss medication possibilties  Orders: [facility]- Est  Level 4 (36644)  Problem # 5:  HYPERLIPIDEMIA (ICD-272.4) Assessment: Unchanged  check cholesterol next visit  Orders: [facility]- Est  Level 4 (03474)  Complete Medication List: 1)  Hyzaar 50-12.5 Mg Tabs (Losartan potassium-hctz) .Marland Kitchen.. 1 tab by mouth daily for blood pressure 2)  Lamictal 100 Mg Tabs (Lamotrigine) .... Take three a day. 3)  Miralax Powd (Polyethylene glycol 3350) .Marland Kitchen.. 17g by mouth once daily as needed constipation, disp bulk powder, qs x 1 month 4)  Premarin 1.25 Mg Tabs (Estrogens conjugated) .... Take one tablet daily 5)  Calcium 600/vitamin D 600-400 Mg-unit Chew (Calcium carbonate-vitamin d) .... Take 1 tablet po two times a day 6)  Cymbalta 60 Mg Cpep (Duloxetine hcl) .Marland Kitchen.. 1 tab by mouth daily for depression 7)  Cymbalta 30 Mg Cpep (Duloxetine hcl) .Marland Kitchen.. 1 tab by mouth daily for depression 8)  Ibuprofen 600 Mg Tabs (Ibuprofen) .Marland Kitchen.. 1 tab by mouth three times a day as needed pain.  take with food 9)  Ferrous Sulfate 325 (65 Fe) Mg Tbec (Ferrous sulfate) .Marland Kitchen.. 1 tab by mouth two times a day for anemia 10)  Vitamin D (ergocalciferol) 50000 Unit Caps (Ergocalciferol) .... 2 caps by mouth weekly for 8 weeks  Patient Instructions: 1)  It was nice to see you today. 2)  Let me know about the depression medicines. 3)  If your finger gets worse, call me. 4)  Please schedule a follow-up appointment the end of  June. 5)  Fill your new prescription for Hyzaar. Prescriptions: HYZAAR 50-12.5 MG TABS (LOSARTAN POTASSIUM-HCTZ) 1 tab by mouth daily for blood pressure  #30 x 6   Entered and Authorized by:   Asher Muir MD   Signed by:   Asher Muir MD on 07/21/2009   Method used:   Print then Give to Patient   RxID:   [id number]   Prevention & Chronic Care Immunizations   Influenza vaccine: Fluvax 3+  (12/20/2006)   Influenza vaccine due: 12/20/2007    Tetanus booster: 02/12/2009: Tdap   Tetanus booster due: 07/04/2009    Pneumococcal vaccine: Not documented  Other Screening  Pap smear: had hysterectomy and oophrectomy  (04/24/2007)   Pap smear action/deferral: Not indicated S/P hysterectomy  (10/07/2008)   Pap smear due: 04/23/2008   Smoking status: never  (07/21/2009)  Lipids   Total Cholesterol: 219  (04/24/2008)   LDL: 107  (04/24/2008)   LDL Direct: Not documented   HDL: 70  (04/24/2008)   Triglycerides: 210  (04/24/2008)    SGOT (AST): 13  (04/24/2007)   SGPT (ALT): 10  (04/24/2007)   Alkaline phosphatase: 60  (04/24/2007)   Total bilirubin: 0.3  (04/24/2007)    Lipid flowsheet reviewed?: Yes   Progress toward LDL goal: At goal  Hypertension   Last Blood Pressure: 130 / 92  (07/21/2009)   Serum creatinine: 0.75  (05/27/2009)   BMP action: Ordered   Serum potassium 4.0  (05/27/2009)    Hypertension flowsheet reviewed?: Yes   Progress toward BP goal: Deteriorated    Stage of readiness to change (hypertension management): Action  Self-Management Support :    Hypertension self-management support: BP self-monitoring log, Written self-care plan, Education handout  (07/21/2009)   Hypertension self-care plan printed.   Hypertension education handout printed    Lipid self-management support: Not documented

## 2010-04-05 NOTE — Miscellaneous (Signed)
   Clinical Lists Changes Dr Clotilde Dieter, Larita Fife asked me to sign a request for CT scan for this lady--I read your note--I think you would be better served by getting MRI of TM joint. This is most likely etiology--I think a pan scan with CT pf head and neck is not going  to help you. I have taken the liberty of changing your order and I have emailed you a really cool article on TMJ imaging. Hope this is OK  Denny Levy MD  November 02, 2009 3:55 PM  Orders: Added new Test order of MRI (MRI) - Signed      Complete Medication List: 1)  Lamictal 100 Mg Tabs (Lamotrigine) .... Take three a day. 2)  Miralax Powd (Polyethylene glycol 3350) .Marland Kitchen.. 17g by mouth once daily as needed constipation, disp bulk powder, qs x 1 month 3)  Premarin 1.25 Mg Tabs (Estrogens conjugated) .... Take one tablet daily 4)  Calcium 600/vitamin D 600-400 Mg-unit Chew (Calcium carbonate-vitamin d) .... Take 1 tablet po two times a day 5)  Ibuprofen 600 Mg Tabs (Ibuprofen) .Marland Kitchen.. 1 tab by mouth three times a day as needed pain.  take with food 6)  Ferrous Sulfate 325 (65 Fe) Mg Tbec (Ferrous sulfate) .Marland Kitchen.. 1 tab by mouth two times a day for anemia 7)  Venlafaxine Hcl 37.5 Mg Xr24h-cap (Venlafaxine hcl) .... Take 2 tabs per day 8)  Ketoconazole 2 % Crea (Ketoconazole) .... Apply to affecteds skin bid x 2 weeks or until 5 days after rash disappears.  give 1 large tube

## 2010-04-05 NOTE — Progress Notes (Signed)
   Phone Note Outgoing Call   Call placed by: Loralee Pacas CMA,  Jul 22, 2009 4:24 PM Call placed to: pharmacy Action Taken: Information Sent Summary of Call: called and gave rx Initial call taken by: Loralee Pacas CMA,  Jul 22, 2009 4:31 PM

## 2010-04-05 NOTE — Assessment & Plan Note (Signed)
Summary: cough,df   Vital Signs:  Patient profile:   35 year old female Weight:      311.1 pounds Temp:     98.6 degrees F oral Pulse rate:   85 / minute Pulse rhythm:   regular BP sitting:   121 / 85  (right arm) Cuff size:   large  Vitals Entered By: Loralee Pacas CMA (May 27, 2009 1:38 PM) Comments coughing up brown mucous and sternum is hurting    Primary Care Provider:  Asher Muir MD   History of Present Illness: 1.  bipolar/depression--had been doing well on lamictal and cymbalta 60.  sees Dr. Ledon Snare (counselor) regularly.  he called and left mssg asking if I would be willing to increase cymbalta to 90.  [name] [name] has been feeling more depressed.  low energy, poor sleep, not enjoying things like she used to.  no thoughts of harming herself.  no signs of mania.    2.  cough--seen at [facility] on 3/14 for fever cough.  thought to be viral illness.  got worse and still with fever; so went to urgent care on 3/19.  prescribed azithro.  no cxr.  still has had fevers and cough despite the azithro.  most recently 2 days ago, a temp of 100.3.  coughing up brown sputum.  also prescribed an inhaler.  no SOB.  afebrile in clinic today  3.  vitamin d deficiency--repleted with high dose D3.  then put on maintenance.  she has stopped taking her maintenance.     Current Medications (verified): 1)  Hyzaar 100-25 Mg Tabs (Losartan Potassium-Hctz) .... Take 1 Tablet By Mouth Once A Day 2)  Lamictal 100 Mg  Tabs (Lamotrigine) .... Take Three A Day. 3)  Miralax   Powd (Polyethylene Glycol 3350) .Marland Kitchen.. 17g By Mouth Once Daily As Needed Constipation, Disp Bulk Powder, Qs X 1 Month 4)  Premarin 1.25 Mg  Tabs (Estrogens Conjugated) .... Take One Tablet Daily 5)  Calcium 600/vitamin D 600-400 Mg-Unit  Chew (Calcium Carbonate-Vitamin D) .... Take 1 Tablet Po Two Times A Day 6)  Cymbalta 30 Mg Cpep (Duloxetine Hcl) .Marland Kitchen.. 1 Tab By Mouth Daily For 1 Week; Then 2 Tabs By Mouth Daily For  Depression  Allergies: 1)  ! Imitrex 2)  ! * Zomig 3)  ! Morphine 4)  ! * Dilaudid 5)  ! Demerol 6)  Lisinopril (Lisinopril) 7)  * Oxycodone  Review of Systems General:  Complains of fatigue, fever, and malaise; denies weakness. ENT:  Denies ear discharge, earache, and nasal congestion. Resp:  Complains of chest pain with inspiration, cough, and sputum productive; denies shortness of breath.  Physical Exam  General:  obese female, NAD, vitals reviewed. low grade fever.  Ears:  tms normal Nose:  External nasal examination shows no deformity or inflammation. Nasal mucosa are pink and moist without lesions or exudates. Mouth:  pharyngeal erythema--mild.  no exudate.   Lungs:  coughing with deep inspiration.  no tachypnea or retractions.  no wheezes, crackles or dullness Heart:  Normal rate and regular rhythm. S1 and S2 normal without gallop, murmur, click, rub or other extra sounds. Additional Exam:  vital signs reviewed    Impression & Recommendations:  Problem # 1:  COUGH (ICD-786.2) Assessment New  With prolonged fever, a little worrisome for pneumonia (although azithro would be an appropriate tx).  Will check CBC and cxr.    Orders: CBC w/Diff-[facility] (85025) [facility]- Est  Level 4 (99214) CXR- 2view (CXR)  Problem #  2:  HYPERTENSION, BENIGN SYSTEMIC (ICD-401.1) Assessment: Unchanged actually at goal off of meds.  due for labs Her updated medication list for this problem includes:    Hyzaar 100-25 Mg Tabs (Losartan potassium-hctz) .Marland Kitchen... Take 1 tablet by mouth once a day  Orders: Basic Met-[facility] ([address]-60454) [facility]- Est  Level 4 (09811)  Problem # 3:  BIPOLAR DISORDER (ICD-296.7) Assessment: Deteriorated  think increasing cymbalta to 90 is fine.  rtc 3 weeks.  not suicidal  Orders: [facility]- Est  Level 4 (91478)  Problem # 4:  UNSPECIFIED VITAMIN D DEFICIENCY (ICD-268.9) Assessment: Unchanged recheck vit d.  regardless of level, she should be on long term maintenance  supplementation. Orders: [facility]- Est  Level 4 (99214) Vit D, 25 [place]-[facility] ([id number])  Complete Medication List: 1)  Hyzaar 100-25 Mg Tabs (Losartan potassium-hctz) .... Take 1 tablet by mouth once a day 2)  Lamictal 100 Mg Tabs (Lamotrigine) .... Take three a day. 3)  Miralax Powd (Polyethylene glycol 3350) .Marland Kitchen.. 17g by mouth once daily as needed constipation, disp bulk powder, qs x 1 month 4)  Premarin 1.25 Mg Tabs (Estrogens conjugated) .... Take one tablet daily 5)  Calcium 600/vitamin D 600-400 Mg-unit Chew (Calcium carbonate-vitamin d) .... Take 1 tablet po two times a day 6)  Cymbalta 60 Mg Cpep (Duloxetine hcl) .Marland Kitchen.. 1 tab by mouth daily for depression 7)  Cymbalta 30 Mg Cpep (Duloxetine hcl) .Marland Kitchen.. 1 tab by mouth daily for depression 8)  Ibuprofen 600 Mg Tabs (Ibuprofen) .Marland Kitchen.. 1 tab by mouth three times a day as needed pain.  take with food 9)  Ciprofloxacin Hcl 500 Mg Tabs (Ciprofloxacin hcl) .Marland Kitchen.. 1 tab by mouth two times a day for 10 days 10)  Vitamin D3 100000 Unit/gm Powd (Cholecalciferol) .... 5 tabs by mouth weekly for 8 weeks 11)  Ferrous Sulfate 325 (65 Fe) Mg Tbec (Ferrous sulfate) .Marland Kitchen.. 1 tab by mouth two times a day for anemia  Patient Instructions: 1)  It was nice to see you today. 2)  Increase your cymbalta to 90mg  (one 60mg  cap and one 30mg ) 3)  Go get your chest x-ray. 4)  I will call you with lab results and x-ray results ([phone]) 5)  Please schedule a follow-up appointment in about 3 weeks.  Prescriptions: IBUPROFEN 600 MG TABS (IBUPROFEN) 1 tab by mouth three times a day as needed pain.  take with food  #90 x 0   Entered and Authorized by:   Asher Muir MD   Signed by:   Asher Muir MD on 05/27/2009   Method used:   Print then Give to Patient   RxID:   [id number] CYMBALTA 30 MG CPEP (DULOXETINE HCL) 1 tab by mouth daily for depression  #30 x 3   Entered and Authorized by:   Asher Muir MD   Signed by:   Asher Muir MD on 05/27/2009    Method used:   Print then Give to Patient   RxID:   [id number]   Prevention & Chronic Care Immunizations   Influenza vaccine: Fluvax 3+  (12/20/2006)   Influenza vaccine due: 12/20/2007    Tetanus booster: 02/12/2009: Tdap   Tetanus booster due: 07/04/2009    Pneumococcal vaccine: Not documented  Other Screening   Pap smear: had hysterectomy and oophrectomy  (04/24/2007)   Pap smear action/deferral: Not indicated S/P hysterectomy  (10/07/2008)   Pap smear due: 04/23/2008   Smoking status: never  (03/08/2009)  Lipids   Total Cholesterol: 219  (04/24/2008)   LDL:  107  (04/24/2008)   LDL Direct: Not documented   HDL: 70  (04/24/2008)   Triglycerides: 210  (04/24/2008)    SGOT (AST): 13  (04/24/2007)   SGPT (ALT): 10  (04/24/2007)   Alkaline phosphatase: 60  (04/24/2007)   Total bilirubin: 0.3  (04/24/2007)    Lipid flowsheet reviewed?: Yes   Progress toward LDL goal: At goal  Hypertension   Last Blood Pressure: 121 / 85  (05/27/2009)   Serum creatinine: 0.75  (09/16/2007)   BMP action: Ordered   Serum potassium 3.6  (09/16/2007)    Hypertension flowsheet reviewed?: Yes   Progress toward BP goal: At goal  Self-Management Support :    Hypertension self-management support: Not documented    Lipid self-management support: Not documented

## 2010-04-05 NOTE — Miscellaneous (Signed)
Summary: call from Dr. Ledon Snare    Clinical Lists Changes   received call from Dr. Ledon Snare and he states patient's mood has stablized on the Effexor  XR 75 mg daily   (brand name ) , doing well.   However patient reports some foggy headed feeling and trouble concentrating and  he  is wondering if her Lamictal  could be decreased to 250 mg daily.  Will forward to MD and will call Dr. Ledon Snare back at [phone] at MD's response . Theresia Lo RN  October 29, 2009 3:57 PM   I called and left a message at Dr. Jennette Banker office. I am happy to decrease the dose to 250 mg (50 mg less than she is getting now). I tried to call [name] about this change to discuss it with her and was unable to get a hold of her. I left a message for her to call the office and be told about the possible medication change. If she is also ok the with change I will discuss with Dr. Kathrynn Running and decrease the dose if it is felt to be best by everyone. Please let the patient know this info if she calls.    spoke with patient and gave her message from Dr. Clotilde Dieter.  advised will call her back when it has been decided  Theresia Lo RN  November 01, 2009 9:19 AM    also patient wants to follow up with CT scan if possible about her jaw problem since xray was negative. Theresia Lo RN  November 01, 2009 9:18 AM

## 2010-04-05 NOTE — Progress Notes (Signed)
Summary: phn msg   Phone Note From Pharmacy Call back at [phone]   Caller: [facility] Summary of Call: pt is now out of her antidepressant and has questions about what she can take until other is decided Initial call taken by: De Nurse,  Jul 22, 2009 11:13 AM  Follow-up for Phone Call        I would advise refilling the cymbalta (her current med) for now.  If that is a problem (she gets it through map), let me know and I will prescribe an alternative that is on the $4 list. Follow-up by: Asher Muir MD,  Jul 22, 2009 11:46 AM  Additional Follow-up for Phone Call Additional follow up Details #1::        left message to return call Additional Follow-up by: Gladstone Pih,  Jul 22, 2009 12:08 PM    Additional Follow-up for Phone Call Additional follow up Details #2::    see other phone note from today for f/u Follow-up by: Asher Muir MD,  Jul 22, 2009 1:57 PM

## 2010-04-05 NOTE — Assessment & Plan Note (Signed)
Summary: ? broken toe,tcb   Vital Signs:  Patient profile:   34 year old female Height:      68 inches Weight:      321.4 pounds Pulse rate:   80 / minute BP sitting:   130 / 80  (right arm) Cuff size:   large  Vitals Entered By: Arlyss Repress CMA, (March 08, 2009 2:50 PM) CC: right little toe injury about 2 weeks ago. Is Patient Diabetic? No Pain Assessment Patient in pain? yes     Location: right little toe Intensity: 5 Onset of pain  x 2 weeks.   Primary Care Provider:  Asher Muir MD  CC:  right little toe injury about 2 weeks ago.Marland Kitchen  History of Present Illness: 35 year old obese AAF:  1. Right Toe Pain: 5th digit plus lateral foot pain, stubbed toe on couch x 2 weeks ago, 5th toe and lateral foot swelling and pain immediately after injury that has not gotten better, hurts to wear regular shoes, can move toes but painful.  Habits & Providers  Alcohol-Tobacco-Diet     Tobacco Status: never  Current Medications (verified): 1)  Hyzaar 100-25 Mg Tabs (Losartan Potassium-Hctz) .... Take 1 Tablet By Mouth Once A Day 2)  Lamictal 100 Mg  Tabs (Lamotrigine) .... Take Three A Day. 3)  Ferrous Sulfate 325 (65 Fe) Mg  Tbec (Ferrous Sulfate) .... Once Daily 4)  Miralax   Powd (Polyethylene Glycol 3350) .Marland Kitchen.. 17g By Mouth Once Daily As Needed Constipation, Disp Bulk Powder, Qs X 1 Month 5)  Premarin 1.25 Mg  Tabs (Estrogens Conjugated) .... Take One Tablet Daily 6)  Calcium 600/vitamin D 600-400 Mg-Unit  Chew (Calcium Carbonate-Vitamin D) .... Take 1 Tablet Po Two Times A Day 7)  Ibu 800 Mg  Tabs (Ibuprofen) .Marland Kitchen.. 1 Tab By Mouth Three Times A Day With Food For 2 Weeks; Then Take Tid As Needed For Back Pain 8)  Metformin Hcl 500 Mg Tabs (Metformin Hcl) .... Please Take One Three Times A Day With Food. 9)  Xenical 120 Mg Caps (Orlistat) .Marland Kitchen.. 1 Tab Po 3 Times/day With Each Main Meal Containing Fat (During or Up To 1 Hour After The Meal); Omit Dose If Meal Is Occasionally Missed  or Contains No Fat. 10)  Bitoin Plus/calcium/vit D3  Tabs (Multiple Vitamins-Minerals) 11)  Vitamin D3 10000 Unit Caps (Cholecalciferol) .... Take 5 Tabs By Mouth Every Monday For The Next 8 Weeks 12)  Cymbalta 30 Mg Cpep (Duloxetine Hcl) .Marland Kitchen.. 1 Tab By Mouth Daily For 1 Week; Then 2 Tabs By Mouth Daily For Depression  Allergies (verified): 1)  ! Imitrex 2)  ! * Zomig 3)  ! Morphine 4)  ! * Dilaudid 5)  ! Demerol 6)  Lisinopril (Lisinopril) 7)  * Oxycodone  Review of Systems MS:  Complains of joint pain, joint redness, and joint swelling. Neuro:  Denies numbness and tingling.  Physical Exam  General:  Well-developed,well-nourished,in no acute distress; alert,appropriate and cooperative throughout examination. Vitals reviewed. Msk:  right foot: no obvious deformity, slight edema and redness of 5th digit, no warmth, no open skin lesions. generalized ttp 5th digit MTPs and metatarsal. + sensation. can wiggle toes but causes pain. Psych:  normally interactive, good eye contact, and not anxious appearing.     Impression & Recommendations:  Problem # 1:  TOE PAIN (ICD-729.5) Assessment New  Right 5th digit. Xray negative for fracture. Advised ice to area, elevate foot while at rest, and Motrin for pain.  Re-evaluate if pain persists in 2-4 weeks.  Orders: [facility]- Est Level  3 (54098)  Complete Medication List: 1)  Hyzaar 100-25 Mg Tabs (Losartan potassium-hctz) .... Take 1 tablet by mouth once a day 2)  Lamictal 100 Mg Tabs (Lamotrigine) .... Take three a day. 3)  Ferrous Sulfate 325 (65 Fe) Mg Tbec (Ferrous sulfate) .... Once daily 4)  Miralax Powd (Polyethylene glycol 3350) .Marland Kitchen.. 17g by mouth once daily as needed constipation, disp bulk powder, qs x 1 month 5)  Premarin 1.25 Mg Tabs (Estrogens conjugated) .... Take one tablet daily 6)  Calcium 600/vitamin D 600-400 Mg-unit Chew (Calcium carbonate-vitamin d) .... Take 1 tablet po two times a day 7)  Ibu 800 Mg Tabs (Ibuprofen) .Marland Kitchen.. 1  tab by mouth three times a day with food for 2 weeks; then take tid as needed for back pain 8)  Metformin Hcl 500 Mg Tabs (Metformin hcl) .... Please take one three times a day with food. 9)  Xenical 120 Mg Caps (Orlistat) .Marland Kitchen.. 1 tab po 3 times/day with each main meal containing fat (during or up to 1 hour after the meal); omit dose if meal is occasionally missed or contains no fat. 10)  Bitoin Plus/calcium/vit D3 Tabs (Multiple vitamins-minerals) 11)  Vitamin D3 10000 Unit Caps (Cholecalciferol) .... Take 5 tabs by mouth every monday for the next 8 weeks 12)  Cymbalta 30 Mg Cpep (Duloxetine hcl) .Marland Kitchen.. 1 tab by mouth daily for 1 week; then 2 tabs by mouth daily for depression  Other Orders: Diagnostic X-Ray/Fluoroscopy (Diagnostic X-Ray/Flu)  Patient Instructions: 1)  It was nice to meet you. 2)  We are sending you for an xray of your foot and will call you with results and instructions.

## 2010-04-07 NOTE — Assessment & Plan Note (Signed)
Summary: hand pain,df   Vital Signs:  Patient profile:   35 year old female Weight:      315 pounds BMI:     48.07 BSA:     2.48 Pulse rate:   80 / minute BP sitting:   120 / 86  Vitals Entered By: Jone Baseman CMA (March 18, 2010 4:52 PM) CC: right hand pain Is Patient Diabetic? No Pain Assessment Patient in pain? yes     Location: right hand Intensity: 3   Primary Care Provider:  Jamie Brookes MD  CC:  right hand pain.  History of Present Illness: [name] [name] has 3 complaints:  1.  Right wrist pain, swelling and difficulty using her hand 2.  Right small toe, ? if broken, banged it on furniture 3.  She thinks she has an oder under her arms and in her urine that she cannot get rid of no matter what she uses.  She has been drinking more water.    Habits & Providers  Alcohol-Tobacco-Diet     Tobacco Status: never  Current Medications (verified): 1)  Lamictal 100 Mg  Tabs (Lamotrigine) .... Take Three A Day. 2)  Miralax   Powd (Polyethylene Glycol 3350) .Marland Kitchen.. 17g By Mouth Once Daily As Needed Constipation, Disp Bulk Powder, Qs X 1 Month 3)  Premarin 1.25 Mg  Tabs (Estrogens Conjugated) .... Take One Tablet Daily 4)  Calcium 600/vitamin D 600-400 Mg-Unit  Chew (Calcium Carbonate-Vitamin D) .... Take 1 Tablet Po Two Times A Day 5)  Ibuprofen 600 Mg Tabs (Ibuprofen) .Marland Kitchen.. 1 Tab By Mouth Three Times A Day As Needed Pain.  Take With Food 6)  Ferrous Sulfate 325 (65 Fe) Mg Tbec (Ferrous Sulfate) .Marland Kitchen.. 1 Tab By Mouth Two Times A Day For Anemia 7)  Venlafaxine Hcl 37.5 Mg Xr24h-Cap (Venlafaxine Hcl) .... Take 2 Tabs Per Day  Allergies: 1)  ! Imitrex 2)  ! * Zomig 3)  ! Morphine 4)  ! * Dilaudid 5)  ! Demerol 6)  Lisinopril (Lisinopril) 7)  * Oxycodone  Past History:  Past Medical History: Last updated: 08/26/2009 BMI = 48 (5/04), now 52 (2010) FT NSVD X 3, `02, `00, and `98,   h/o cervical dyspl req. cryotherapy `92, nl f/u,  PTL in `98 pregancy  dehydration  hospitalization hypertension bipolar disorder.  therapist is Ledon Snare         Social History: Reviewed history from 10/15/2009 and no changes required. Mom (Celestine McKenzie)has severe depression. Lives with kids, works at daycare center, usually attends school at [employer] No tob, drugs; rare ETOH.  tremendous social stressors - single mom with 3 children, mentally ill mother, working and in school.    Physical Exam  General:  Very alert/hypomanic morbildly obese AA female Msk:  right wrist is mild degree of edema, negative phalen and tinels, no muscle wasting, good strength of thenar, interossis.  Pain with wrist extension (not flexion)  Right small toe, reddened but blanchable.  Somewhat under-riding the 4th digit. Psych:  Did not appreciate unusual odor from underarms that she smelled.   Impression & Recommendations:  Problem # 1:  OTHER TENOSYNOVITIS OF HAND AND WRIST (ICD-727.05) wrist splint to wear at night only, use during the day, ibuprofen as needed, topcial aspercream (cannot afford topical NSAID, uses health dept) Orders: [facility]- Est  Level 4 (78295)  Problem # 2:  TOE PAIN (ICD-729.5) brused or sore from tight fitting shoe, gave tape to buddy tape in order to splint for a few days.  Orders: [facility]- Est  Level 4 (45409)  Problem # 3:  HYPERHIDROSIS (ICD-780.8) morbid obesity, likely does have extra perspiration but did not appreciate unusual odor. Drink more water. Orders: [facility]- Est  Level 4 (99214)  Complete Medication List: 1)  Lamictal 100 Mg Tabs (Lamotrigine) .... Take three a day. 2)  Miralax Powd (Polyethylene glycol 3350) .Marland Kitchen.. 17g by mouth once daily as needed constipation, disp bulk powder, qs x 1 month 3)  Premarin 1.25 Mg Tabs (Estrogens conjugated) .... Take one tablet daily 4)  Calcium 600/vitamin D 600-400 Mg-unit Chew (Calcium carbonate-vitamin d) .... Take 1 tablet po two times a day 5)  Ibuprofen 600 Mg Tabs (Ibuprofen) .Marland Kitchen.. 1 tab by mouth three times a day  as needed pain.  take with food 6)  Ferrous Sulfate 325 (65 Fe) Mg Tbec (Ferrous sulfate) .Marland Kitchen.. 1 tab by mouth two times a day for anemia 7)  Venlafaxine Hcl 37.5 Mg Xr24h-cap (Venlafaxine hcl) .... Take 2 tabs per day  Patient Instructions: 1)  Please schedule a follow-up appointment in 2 -4 weeks with prmary MD to meet her and to follow up on toe, sweating and wirst.  Prescriptions: IBUPROFEN 600 MG TABS (IBUPROFEN) 1 tab by mouth three times a day as needed pain.  take with food Brand medically necessary #90 x 1   Entered and Authorized by:   Luretha Murphy NP   Signed by:   Luretha Murphy NP on 03/18/2010   Method used:   Print then Give to Patient   RxID:   [id number]    Orders Added: 1)  [facility]- Est  Level 4 [[id number]]

## 2010-04-13 NOTE — Assessment & Plan Note (Signed)
Summary: wrist pain   Vital Signs:  Patient profile:   35 year old female Height:      68 inches Weight:      314 pounds Temp:     98.6 degrees F oral Pulse rate:   81 / minute Pulse rhythm:   regular BP sitting:   134 / 92  (left arm) Cuff size:   large CC: follow-up visit   Primary Care Provider:  Jamie Brookes MD  CC:  follow-up visit.  History of Present Illness: wrist pain: Pt is still having wrist pain. She has pain that is worsened when she wears the brace. She has had the pain x 1 month. She has not tried the aspercreme. She is not icing the wrist. She has not had any x-rays of the wrist. She has been seen before for this concern. 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   Habits & Providers  Alcohol-Tobacco-Diet     Tobacco Status: never  Current Medications (verified): 1)  Lamictal 100 Mg Tabs (Lamotrigine) .... Take 2.5 Tablets Daily 2)  Premarin 1.25 Mg  Tabs (Estrogens Conjugated) .... Take One Tablet Daily 3)  Calcium 600/vitamin D 600-400 Mg-Unit  Chew (Calcium Carbonate-Vitamin D) .... Take 1 Tablet Po Two Times A Day 4)  Ferrous Sulfate 325 (65 Fe) Mg Tbec (Ferrous Sulfate) .Marland Kitchen.. 1 Tab By Mouth Two Times A Day For Anemia 5)  Venlafaxine Hcl 37.5 Mg Xr24h-Cap (Venlafaxine Hcl) .... Take 2 Tabs Per Day  Allergies (verified): 1)  ! Imitrex 2)  ! * Zomig 3)  ! Morphine 4)  ! * Dilaudid 5)  ! Demerol 6)  Lisinopril (Lisinopril) 7)  * Oxycodone  Review of Systems        vitals reviewed and pertinent negatives and positives seen in HPI   Physical Exam  General:  Well-developed,well-nourished,in no acute distress; alert,appropriate and cooperative throughout examination Msk:  Rt wrist tenderness on posterior wrist. no swelling noted by myself.    Impression & Recommendations:  Problem # 1:  WRIST PAIN, LEFT (ICD-719.43) Assessment Unchanged Pt is still having a lot of wrist pain. Plan to r/o boney  abN with x-ray, encouraged pt to ice her wrist and try putting aspercreme on it. She agreed.   Orders: Diagnostic X-Ray/Fluoroscopy (Diagnostic X-Ray/Flu) [facility]- Est Level  3 (09811)  Complete Medication List: 1)  Lamictal 100 Mg Tabs (Lamotrigine) .... Take 2.5 tablets daily 2)  Premarin 1.25 Mg Tabs (Estrogens conjugated) .... Take one tablet daily 3)  Calcium 600/vitamin D 600-400 Mg-unit Chew (Calcium carbonate-vitamin d) .... Take 1 tablet po two times a day 4)  Ferrous Sulfate 325 (65 Fe) Mg Tbec (Ferrous sulfate) .Marland Kitchen.. 1 tab by mouth two times a day for anemia 5)  Venlafaxine Hcl 37.5 Mg Xr24h-cap (Venlafaxine hcl) .... Take 2 tabs per day  Patient Instructions: 1)  Try the Aspercreme for the wrist. 2)  Try to ice the wrist 3 times a day for 15 min.  3)  I will call you with x-ray results.    Orders Added: 1)  Diagnostic X-Ray/Fluoroscopy [Diagnostic X-Ray/Flu] 2)  [facility]- Est Level  3 [91478]

## 2010-04-17 ENCOUNTER — Ambulatory Visit (HOSPITAL_COMMUNITY)
Admission: RE | Admit: 2010-04-17 | Discharge: 2010-04-17 | Disposition: A | Discharge status: Home / Self Care | Payer: Self-pay | Source: Ambulatory Visit | Attending: Family Medicine | Admitting: Family Medicine

## 2010-04-17 ENCOUNTER — Inpatient Hospital Stay (INDEPENDENT_AMBULATORY_CARE_PROVIDER_SITE_OTHER)
Admission: RE | Admit: 2010-04-17 | Discharge: 2010-04-17 | Disposition: A | Discharge status: Home / Self Care | Payer: Self-pay | Source: Ambulatory Visit | Attending: Emergency Medicine | Admitting: Emergency Medicine

## 2010-04-17 DIAGNOSIS — Z9181 History of falling: Secondary | ICD-10-CM | POA: Insufficient documentation

## 2010-04-17 DIAGNOSIS — M25549 Pain in joints of unspecified hand: Secondary | ICD-10-CM | POA: Insufficient documentation

## 2010-04-17 DIAGNOSIS — M79609 Pain in unspecified limb: Secondary | ICD-10-CM

## 2010-04-20 ENCOUNTER — Telehealth: Payer: Self-pay | Admitting: Family Medicine

## 2010-04-20 ENCOUNTER — Other Ambulatory Visit: Payer: Self-pay | Admitting: Family Medicine

## 2010-04-20 DIAGNOSIS — R5383 Other fatigue: Secondary | ICD-10-CM | POA: Insufficient documentation

## 2010-04-20 DIAGNOSIS — F411 Generalized anxiety disorder: Secondary | ICD-10-CM

## 2010-04-20 MED ORDER — VENLAFAXINE HCL 75 MG PO TABS: 75.0000 mg | ORAL_TABLET | Freq: Two times a day (BID) | ORAL | Status: DC

## 2010-04-25 ENCOUNTER — Encounter: Payer: Self-pay | Admitting: Family Medicine

## 2010-04-25 ENCOUNTER — Ambulatory Visit (INDEPENDENT_AMBULATORY_CARE_PROVIDER_SITE_OTHER): Payer: Self-pay | Admitting: Family Medicine

## 2010-04-25 VITALS — BP 135/89 | HR 86 | Temp 98.4°F | Wt 323.5 lb

## 2010-04-25 DIAGNOSIS — F319 Bipolar disorder, unspecified: Secondary | ICD-10-CM

## 2010-04-25 DIAGNOSIS — R5383 Other fatigue: Secondary | ICD-10-CM

## 2010-04-25 DIAGNOSIS — R61 Generalized hyperhidrosis: Secondary | ICD-10-CM

## 2010-04-25 DIAGNOSIS — L0233 Carbuncle of buttock: Secondary | ICD-10-CM

## 2010-04-25 DIAGNOSIS — L0232 Furuncle of buttock: Secondary | ICD-10-CM

## 2010-04-25 DIAGNOSIS — M25539 Pain in unspecified wrist: Secondary | ICD-10-CM

## 2010-04-25 DIAGNOSIS — R5381 Other malaise: Secondary | ICD-10-CM

## 2010-04-25 MED ORDER — VENLAFAXINE HCL ER 75 MG PO TB24: 75.0000 mg | ORAL_TABLET | Freq: Every day | ORAL | Status: DC

## 2010-04-25 NOTE — Patient Instructions (Addendum)
You have been given your new Effexor prescription today    Use the Hibiclens shower/bath instructions below every time you shower/bath:  Antimicrobial soap (2% or 4% Chlorhexidine, also called Hibitane) Use this soap for all handwashing, bathing and showering. When using the chlorhexidine soap in the shower, apply the soap and lather all areas of your body from scalp to toes. After wetting your skin, let the soap lather sit on your skin for about one minute before rinsing it off. Use the Vaseline or Powerglide to prevent friction rub.  Use lycra pants to keep from having friction too.  We will let you know what the swab grows in the lab.

## 2010-04-26 DIAGNOSIS — L0232 Furuncle of buttock: Secondary | ICD-10-CM | POA: Insufficient documentation

## 2010-04-26 NOTE — Assessment & Plan Note (Signed)
I think this is all related to her CTS and am planning to refer her to a hand surgeon. Even though the patient is not ready for surgery at this time, I would like her to explore her options including extended splinting, injections, and any other treatment available. Also I would like her to establish care with a surgeon because I think she will need CT release surgery someday.

## 2010-04-26 NOTE — Assessment & Plan Note (Signed)
Plan to treat with hibiclens baths. Pt has had one other boil that I have seen. I don't think she needs antibiotics for this at this time but will consider in the future.

## 2010-04-26 NOTE — Progress Notes (Signed)
  Subjective:    Patient ID: [name] [name], female    DOB: 10/05/1975, 35 y.o.   MRN: [id number]  HPI Bilateral Wrist/Hand pain: Pt has been told in the past that she has carpel tunnel syndrome. She has not wanted to get surgery because she witnessed her mother have a bad outcome with CTS surgery that left one hand unable to function. She has had pain for the last 6 weeks in her Rt hand. She has tried Advil for pain medicine and a wrist brace but says that the wrist brace also hurts her hand. She started having some left wrist/hand pain after a fall one week ago and now has that hand in a brace as well. 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   Depression: Pt has tried many different depression meds and has been taking the Effexor and doing well on it. However, the pharmacy only had the 37.5 mg tablets when she first started taking it and she was taking them twice a day to equal 75 mg XR. She was recently made aware that the pharmacy has the 75 mg XR tablets in stock and wants to switch her Rx to this original dose. She says she feels very good on this medicine.   Odor: Pt c/o axillary and vaginal odor. She says that she smells like urine and that she has been able to smell it ever since she had her hysterectomoy in 2008. She says she leaks urine and when she takes a shower she says the whole showers smells strongly of urine and she has to scrub her privates to get rid of the smell. She says that her axilla smell too and she just scrubs them to try to get rid of the smell. She has tried Animator, Arm and Hammer, Mens and Women's Deoderant, Antiperspirant and invisible sticks of deodorants.   Boil: Pt has a boil on her left buttock and says that she has had others that come up. She wants to know if there is any treatment for them.      Review of Systems  Genitourinary:       Boil on buttock with pain) Vaginal odor of urine  Musculoskeletal:  Positive for joint swelling and arthralgias.  All other systems reviewed and are negative.       Objective:   Physical Exam  Constitutional: She is oriented to person, place, and time. She appears well-developed and well-nourished.  Genitourinary:       Pt has a small early boil developing on the left inner buttocks. Size 0.5 cm  Musculoskeletal: She exhibits tenderness. She exhibits no edema.       Tenderness on the dorsum of the hands bilatearlly and Rt hand MCP and DIP joints. Phalens test was neg, normal grip    Neurological: She is alert and oriented to person, place, and time.  Skin: Skin is warm and dry. No rash noted. No erythema.          Assessment & Plan:

## 2010-04-26 NOTE — Assessment & Plan Note (Signed)
Rx changed to Effexor 75 mg XR so that the patient can just take it once a day.

## 2010-04-26 NOTE — Assessment & Plan Note (Signed)
Likely the cause for the body odor. Suggested doing the hibiclens washes for a while to decrease odor and decrease boils.

## 2010-04-28 ENCOUNTER — Telehealth: Payer: Self-pay | Admitting: *Deleted

## 2010-04-28 NOTE — Telephone Encounter (Signed)
recieved call form Dr. Ledon Snare . He states the generic Effexor is working Agricultural consultant for patient . She is currently taking two 37.5 mg tabs. He is wondering if MD will switch to the 75 mg tab.  Pharmacy is [facility]. Will send message to Dr. Earnest Bailey to ask if there is any problem with changing to Effexor 75 mg and RN will call into [facility].   also Dr. Ledon Snare states patient has complained for a while about feeing tired and fatigued and at first he had wanted to rule out depression. Now he feels her depression  is improved  and he is recommending patient be referred to a neurologist regarding possible narcolepsy. She has experienced the tiredness  most of her life and also reports daytime sleepiness. Will forward to Dr. Earnest Bailey in Dr.Strother's absence.   call Carey Bullocks call back # [phone].

## 2010-04-28 NOTE — Telephone Encounter (Signed)
Switching to one 75 mg tab is fine.  Please have patient follow-up with Dr. Clotilde Dieter if no appointment is already scheduled to discuss referral to neurologist.    I will forward this to her to see upon her return.

## 2010-04-29 NOTE — Telephone Encounter (Signed)
[facility] pharmacy and was told that the  Effexor 75 mg is on back order. Called patient and she has plenty of the   37.5 mg on hand . Advised that she needs to schedule appointment with Dr. Clotilde Dieter to discuss neurologist referral and she states she has already spoken with Dr. Clotilde Dieter about this and she is first going to order a Sleep Study . Patient is waiting for appointment to be scheduled.

## 2010-05-02 NOTE — Telephone Encounter (Signed)
Please see notes.

## 2010-05-05 ENCOUNTER — Telehealth: Payer: Self-pay | Admitting: Family Medicine

## 2010-05-05 NOTE — Telephone Encounter (Signed)
Spoke with patient and informed her that referrals were faxed in and that they will get in touch with her

## 2010-05-05 NOTE — Telephone Encounter (Signed)
Wants to know if doc has found a hand doctor for her to go to.  Hand is still not better.  Also, wants to know if sleep study has been scheduled

## 2010-05-05 NOTE — Telephone Encounter (Signed)
[name] [name]  Is working on referral and will notified patient when this has been scheduled.

## 2010-05-11 ENCOUNTER — Ambulatory Visit (HOSPITAL_BASED_OUTPATIENT_CLINIC_OR_DEPARTMENT_OTHER): Payer: Self-pay | Attending: Family Medicine

## 2010-05-11 DIAGNOSIS — G4733 Obstructive sleep apnea (adult) (pediatric): Secondary | ICD-10-CM | POA: Insufficient documentation

## 2010-05-13 ENCOUNTER — Telehealth: Payer: Self-pay | Admitting: *Deleted

## 2010-05-13 NOTE — Telephone Encounter (Signed)
Spoke with patient and she tells me that her card was supposed to expire on 3.14.12.  Asked that she call Jaynee Eagles and have her contact Denean.  Pt agreeable and Denean informed to expect phone call.  Pt also inquiring about her referral to a Hydrographic surveyor.  Check in workqueue and saw that it is in [place] Request mode.  Will forward message to white team nurses to update patient.  Pt agreeable with this plan Elver Stadler, Maryjo Rochester

## 2010-05-13 NOTE — Telephone Encounter (Signed)
Pt returning your call

## 2010-05-13 NOTE — Telephone Encounter (Signed)
Denean from Sleep center called and ask if we have a current copy of her [facility] card.  The one that they have a copy of expired on 3.4.12.  She needs to know if she has a new one.  Attempted to call patient but had to LMOVM for callback.  If patient has a new card then she will need to bring it here or to the sleep study center, otherwise they will have to make it selfpay. Advised that I would give Denean a call back with the info.

## 2010-05-14 DIAGNOSIS — G4733 Obstructive sleep apnea (adult) (pediatric): Secondary | ICD-10-CM

## 2010-05-16 NOTE — Telephone Encounter (Signed)
LVM for patient to call back to inform her that she will need to have an updated card so that I can refer her to a hand surgeon.

## 2010-05-17 NOTE — Telephone Encounter (Signed)
Please close this encounter

## 2010-05-18 NOTE — Telephone Encounter (Signed)
Huntley Dec, Please make sure that the hand surgery referral was done on this patient.   Front desk, she is going to call to make an appointment with Sports Medicine so she can at least get ultrasound and maybe some answers while waiting to get in to see a Hydrographic surveyor.

## 2010-05-25 ENCOUNTER — Ambulatory Visit: Payer: Self-pay | Admitting: Family Medicine

## 2010-06-03 ENCOUNTER — Ambulatory Visit (HOSPITAL_BASED_OUTPATIENT_CLINIC_OR_DEPARTMENT_OTHER): Payer: Self-pay

## 2010-06-08 ENCOUNTER — Ambulatory Visit: Payer: Self-pay | Admitting: Family Medicine

## 2010-06-17 ENCOUNTER — Telehealth: Payer: Self-pay | Admitting: Family Medicine

## 2010-06-17 NOTE — Telephone Encounter (Signed)
Pt is wanting to know results of her sleep study done in march

## 2010-06-22 ENCOUNTER — Other Ambulatory Visit: Payer: Self-pay | Admitting: Family Medicine

## 2010-06-22 DIAGNOSIS — R5383 Other fatigue: Secondary | ICD-10-CM

## 2010-06-22 NOTE — Telephone Encounter (Signed)
I sent in a referral for a Multiple Sleep Latency Test to be done at the sleep study center. I discussed this with Dr. Maple Hudson and the patient and they want to move forward with this. Please set it up. Thanks.

## 2010-06-24 ENCOUNTER — Encounter: Payer: Self-pay | Admitting: Family Medicine

## 2010-06-24 ENCOUNTER — Ambulatory Visit (INDEPENDENT_AMBULATORY_CARE_PROVIDER_SITE_OTHER): Payer: Self-pay | Admitting: Family Medicine

## 2010-06-24 VITALS — BP 128/76 | HR 77 | Temp 98.5°F | Ht 69.0 in | Wt 328.6 lb

## 2010-06-24 DIAGNOSIS — E669 Obesity, unspecified: Secondary | ICD-10-CM

## 2010-06-24 DIAGNOSIS — D509 Iron deficiency anemia, unspecified: Secondary | ICD-10-CM

## 2010-06-24 DIAGNOSIS — N760 Acute vaginitis: Secondary | ICD-10-CM | POA: Insufficient documentation

## 2010-06-24 LAB — POCT WET PREP (WET MOUNT)
Trichomonas Wet Prep HPF POC: NEGATIVE
Yeast Wet Prep HPF POC: NEGATIVE

## 2010-06-24 LAB — CBC
Platelets: 244 10*3/uL (ref 150–400)
RDW: 13.7 % (ref 11.5–15.5)
WBC: 6.1 10*3/uL (ref 4.0–10.5)

## 2010-06-24 LAB — POCT URINALYSIS DIPSTICK
Bilirubin, UA: NEGATIVE
Glucose, UA: NEGATIVE
Nitrite, UA: NEGATIVE
Spec Grav, UA: 1.025
Urobilinogen, UA: 0.2

## 2010-06-24 LAB — POCT UA - MICROSCOPIC ONLY

## 2010-06-24 LAB — TSH: TSH: 2.618 u[IU]/mL (ref 0.350–4.500)

## 2010-06-24 NOTE — Assessment & Plan Note (Addendum)
Pt has a h/o anemia. She continues to take iron pills.  Plan to retest her iron studies and hemoglobin.

## 2010-06-24 NOTE — Telephone Encounter (Signed)
Faxed referral

## 2010-06-24 NOTE — Progress Notes (Signed)
Vaginitis: Pt has been having some discomfort in her vaginal area x 5 days. She has normal appearing urine but says it has an odor to it. She does not have a discharge. She started using a new body wash and toilet paper recently and wonders if that is the cause.   Anemia: Pt is concerned for anemia since she has been feeling tired lately. She has a h/o iron def anemia and is currently taking the iron as prescribed. She was last tested in Oct and her Hg was a little low. She has also recently done a sleep study to see if this is the cause of her fatigue. She did well on the sleep study and we are now trying to get a sleep latency test done to look at possible narcolepsy.   Obesity: Pt has gained 5 lbs since the last visit 2 months ago. She is discouraged about this and wants to know if we can test her Vit D, Ca, and thyroid again. We discussed that she had her Vit D and Ca checked within the last 1 year and they were both normal. However, since we have not checked her Thyroid in about 3 years we can do that today.   ROs: neg except as noted by HPI:  PE:  Gen: NAD, sitting on table, obese HEENT: Pt has some facial hair that she pointed out, minimal hair noted under chin but pt does tweeze her hair a lot.  GU: bimanual exam difficult because of her weight. Speculum exam normal, no signs of discharge.

## 2010-06-24 NOTE — Assessment & Plan Note (Signed)
PT has gained 5 lbs since last visit. . Plan to check a TSH

## 2010-06-24 NOTE — Assessment & Plan Note (Signed)
Pt just had a sleep study and the results were discussed with her. I discussed the results with Dr. Maple Hudson as well and he suggested a sleep latency test to look for signs of narcolepsy. This has been done. She still feels tired. Plan to get TSH and iron studies done.

## 2010-06-24 NOTE — Assessment & Plan Note (Signed)
Pt has been feeling discomfort in her vaginal area. She is using a new body wash and a new type of toilet paper. Wants to just be checked. Has noticed a foul smell. Will do wet prep and UA.

## 2010-06-24 NOTE — Patient Instructions (Signed)
We will call you with results.  We are checking your anemia and thyroid. We are also doing a urinalysis and wet prep.  You can have your Vit D level rechecked in about 3-4 months.

## 2010-06-25 LAB — IBC PANEL
%SAT: 25 % (ref 20–55)
TIBC: 374 ug/dL (ref 250–470)
UIBC: 279 ug/dL

## 2010-06-25 LAB — IRON: Iron: 95 ug/dL (ref 42–145)

## 2010-06-27 ENCOUNTER — Other Ambulatory Visit: Payer: Self-pay | Admitting: Family Medicine

## 2010-06-27 DIAGNOSIS — N760 Acute vaginitis: Secondary | ICD-10-CM | POA: Insufficient documentation

## 2010-06-27 DIAGNOSIS — B9689 Other specified bacterial agents as the cause of diseases classified elsewhere: Secondary | ICD-10-CM

## 2010-06-27 MED ORDER — METRONIDAZOLE 500 MG PO TABS: 500.0000 mg | ORAL_TABLET | Freq: Two times a day (BID) | ORAL | Status: AC

## 2010-07-13 ENCOUNTER — Encounter: Payer: Self-pay | Admitting: Family Medicine

## 2010-07-13 ENCOUNTER — Ambulatory Visit (INDEPENDENT_AMBULATORY_CARE_PROVIDER_SITE_OTHER): Payer: Self-pay | Admitting: Family Medicine

## 2010-07-13 VITALS — BP 111/71 | HR 78 | Temp 97.4°F | Ht 69.0 in | Wt 330.0 lb

## 2010-07-13 DIAGNOSIS — R300 Dysuria: Secondary | ICD-10-CM

## 2010-07-13 DIAGNOSIS — R1012 Left upper quadrant pain: Secondary | ICD-10-CM

## 2010-07-13 LAB — POCT URINALYSIS DIPSTICK
Leukocytes, UA: NEGATIVE
Protein, UA: NEGATIVE
Spec Grav, UA: >=1.03
Urobilinogen, UA: 0.2

## 2010-07-13 LAB — POCT UA - MICROSCOPIC ONLY

## 2010-07-13 NOTE — Progress Notes (Signed)
Pt states that she is having sharp stomach and intestinal pain (6/10) for the past 3 weeks. Pain is worse when she eats, intermitted pain and bloating, denies fever,chills, n/v/d says it feels raw.Laureen Ochs, Viann Shove

## 2010-07-13 NOTE — Progress Notes (Signed)
Abd pain: Pt has been having pain in her stomach for the last 3 weeks. It is LUQ and she says it is a burning "raw" pain. Pain is 6/10 and comes in episodes of pain. She has tried tums but it does not help. She shas it does not seem to relate to what she eats or drinks, it does not wake her up from sleep but can be any time of the day. It is not made worse or relieved by stooling. She has not had any vomit, diarrhea, or constipation. She had a hysterectomy so she knows it isn't her uterus causing her pain. She has a h/o a Gi scope with a hiatal hernia noted in 2008. She does take ibuprofen as needed for pain.   ROS: neg except as noted above. Pt also mentions some low back pain.   PE:  Gen: pt appears to be in pain Abd: left upper quadrant pain, no guarding, + BS, obese Back: no CVA tenderness, some lumbar paraspinal tenderness.

## 2010-07-13 NOTE — Assessment & Plan Note (Addendum)
Left upper quadrant pain. H/o ulcers, h/o 2 cm hiatal hernia, no relation to food or drink , GI referral sent. Pt declined need for change in meds saying she was already on too many meds and wants to find out what is going on first.

## 2010-07-13 NOTE — Patient Instructions (Signed)
Your GI referral has been put in.  Sleep disorder center: [phone] to check on narcolepsy sleep study.

## 2010-07-19 NOTE — Group Therapy Note (Signed)
NAMELEXYS, MILLINER NO.:  [id number]   MEDICAL RECORD NO.:  [id number]          PATIENT TYPE:  WOC   LOCATION:  [facility]                   FACILITY:  [facility]   PHYSICIAN:  Ginger Carne, MD DATE OF BIRTH:  08/27/1975   DATE OF SERVICE:  10/25/2006                                  CLINIC NOTE   [name] [name] is a 35 year old African American female gravida 5, para 3-  0-2-3 who presents with a longstanding history over the years with  worsening discomfort with her menses for 1 week, and 1 week prior as  well.  She describes cramping pain and pain with intercourse that  radiates to her back as well.  In the past week she was in the emergency  department where she was diagnosed with a pelvic inflammatory disease.  GC and chlamydia DNA probe were negative.  Urinalysis was negative and  she was not pregnant.  She was given Rocephin and a 2-week course of  doxycycline.  She continues to have discomfort which she describes as  separate and apart from the usual lower back pain from time to time due  to her weight.  The patient has no symptoms or has any sources to  contribute to her discomfort from a genitourinary or gastrointestinal  source.   OB/GYN HISTORY:  The patient has had a tubal ligation in the past, has  had three full-term vaginal deliveries and two first-trimester  miscarriages.  She is unable to utilize contraception due to her  hypertension.   MEDICAL HISTORY:  The patient is hypertensive and bipolar disease.   PAST SURGICAL HISTORY:  Tubal ligation.   CURRENT MEDICATIONS:  1. Hyzaar 100/25 one daily.  2. Lamictal 100 mg one daily.  3. Doxycycline one twice a day.  4. Promethazine one-half to one tablet 25 mg as needed for nausea.   ALLERGIES:  OXYCODONE and DILAUDID which cause rash and shortness of  breath.   FAMILY HISTORY:  Her mother and father have type 2 diabetes and  hypertension.  Her mother has been operated on for endometriosis,  including her sisters as well.   FOURTEEN-POINT COMPREHENSIVE REVIEW OF SYSTEMS:  Negative.   SOCIAL HISTORY:  The patient is a nonsmoker.  Denies alcohol or illicit  drug abuse.  The patient works in a day care center.   PHYSICAL EXAMINATION:  Weight 319 pounds, blood pressure 132/85, height  5 feet 9 inches, pulse 91 and regular, temperature 98.1.  HEENT:  Grossly normal.  BREAST EXAM:  Without masses, discharge, thickenings or tenderness.  CHEST:  Clear to percussion and auscultation.  CARDIOVASCULAR:  Without murmurs or enlargements, regular rate and  rhythm.  EXTREMITY, LYMPHATIC, SKIN, NEUROLOGICAL, MUSCULOSKELETAL SYSTEMS:  Within normal limits.  ABDOMEN:  Obese without gross hepatosplenomegaly.  PELVIC EXAM:  Negative Pap smear External genitalia, vulva and vagina  normal.  Cervix smooth without erosions or lesions.  Uterus is difficult  to palpate due to her size.  Otherwise, both adnexa are tender including  cervical motion tenderness.   IMPRESSION:  Chronic pelvic pain, possibly related to pelvic  inflammatory disease/endometriosis.   PLAN:  The  patient has no desire for further childbearing nor does she  wish to use utilize long-term narcotics medications.  She is unable to  use oral contraception due to her hypertension and also declines same.  The patient would prefer a hysterectomy and as such was offered a  laparoscopic-assisted vaginal hysterectomy with removal of both tubes  and ovaries.  She was given the option of preserving one adnexa but  understands there is a 40% chance of possible recurrent pain  necessitating further surgery in the past.  She is a [place] [employer]  cardiac class I patient and denies chest pain on exertion or rest.  The  nature of said procedure discussed in detail including possible injuries  to ureter, bowel and bladder; possible conversion to an open procedure;  hemorrhage possibly requiring blood transfusion; postoperative cuff or   incisional infection; blood transfusion complications; and possible  pneumonia due to her weight.  The patient is agreeable with said  recommendation in addition to understanding risks, benefits associated  with said procedure.  We discussed hormone replacement therapy  afterwards as well.           ______________________________  Ginger Carne, MD     SHB/MEDQ  D:  10/25/2006  T:  10/26/2006  Job:  [id number]

## 2010-07-19 NOTE — Group Therapy Note (Signed)
[name] [name], [name] [name]             ACCOUNT NO.:  [id number]   MEDICAL RECORD NO.:  [id number]          PATIENT TYPE:  WOC   LOCATION:  [facility]                   FACILITY:  [facility]   PHYSICIAN:  Johnella Moloney, MD        DATE OF BIRTH:  01-Mar-1976   DATE OF SERVICE:  11/28/2006                                  CLINIC NOTE   CHIEF COMPLAINT:  Postoperative check.   HISTORY OF PRESENT ILLNESS:  The patient is a 35 year old who is status  post an LAVH on 11/06/06. The patient's  postoperative course has been  complicated by having visits to the MAU for postoperative pain. The  patient was also admitted on 11/19/06, for nausea and vomiting. During  this hospitalization the patient underwent evaluation for this nausea  and vomiting and had a normal CT scan which showed no signs of  intestinal perforation, obstruction and no evidence for gallstones or  biliary dilatation. An upper endoscopy was also performed on 9/16, which  showed evidence of a hiatal hernia and gastroesophageal reflux disease.  She was discharged on Prilosec with plans to follow up with  gastroenterology.   On evaluation today the patient reports having markedly improved  symptoms while on Prilosec. She denies any further abdominal or pelvic  pain but does reports some increased incisional pain, especially now  that she is more active. She denies any bleeding, abnormal vaginal  discharge, fevers, gastrointestinal or genitourinary symptoms. She is  having normal urination and bowel habits.   PHYSICAL EXAMINATION:  VITAL SIGNS: Temperature 99, pulse 90, blood  pressure 119/80, weight 315 pounds.  GENERAL: No apparent distress.  ABDOMEN: On exam, soft, obese. Laparoscopic incisional sites healing  well. No erythema, no abnormal drainage noted.  PELVIC: Exam deferred.   ASSESSMENT AND PLAN:  The patient is here for 3-week postoperative  check. The patient is also status post recent admission for nausea and  vomiting and on  work up was found to have a hiatal hernia and  gastroesophageal reflux disease. She is currently being treated with  Prilosec. The patient is otherwise doing well. Advised continued pelvic  rest until at least 6 weeks postop and the patient verbalized  understanding of the plan. Will see patient again in 1 month for a  postoperative pelvic examination after which the patient will be told to  discontinue pelvic rest if her examination at that visit is normal. The  patient was advised to call or come to the emergency room for any  worsening symptoms.           ______________________________  Johnella Moloney, MD     UD/MEDQ  D:  11/28/2006  T:  11/29/2006  Job:  [id number]

## 2010-07-19 NOTE — Discharge Summary (Signed)
[name] [name], [name] [name]             ACCOUNT NO.:  [id number]   MEDICAL RECORD NO.:  [id number]          PATIENT TYPE:  WOC   LOCATION:  [facility]                          FACILITY:  [facility]   PHYSICIAN:  Ginger Carne, MD  DATE OF BIRTH:  06-Sep-1975   DATE OF ADMISSION:  10/25/2006  DATE OF DISCHARGE:  11/06/2006                               DISCHARGE SUMMARY   ADMISSION DIAGNOSIS:  Chronic pelvic pain with menorrhagia and severe  dysmenorrhea.   HOSPITAL PROCEDURE:  Laparoscopic-assisted vaginal hysterectomy and  bilateral salpingo-oophorectomy.   FINAL DIAGNOSES:  1. Chronic pelvic pain with menorrhagia and severe dysmenorrhea.  2. Stage I endometriosis.   HOSPITAL COURSE:  This is a 35 year old African American female who  underwent the aforementioned procedure on 10/06/2006.  Her  intraoperative course was uncomplicated.  Her postoperative course was  uneventful.  Her postoperative hemoglobin was 11.0, hematocrit 32.2 and  creatinine 1.02.  Her abdomen was soft with bowel sounds and she passed  gas.  Her calves were without tenderness.  The lungs were clear.  She  had scant vaginal flow.  She was discharged with routine postoperative  instructions, including contacting the office for temperature elevation  above 100.4 degrees Fahrenheit, increasing abdominal of incisional pain,  vaginal bleeding, GI or GU symptomatology, and/or any other complaints.   DISCHARGE MEDICATIONS:  She was discharged with her routine preoperative  medications, which included:  1. Hyzaar 100/25 daily.  2. Ibuprofen 800 mg q.8 h. as needed.  3. Lamictal 100 mg daily.  4. Soma compound 350 mg q.8 h. as needed.   She was prescribed:  1. Vicodin 1-2 q.6 h. as needed for postoperative pain.  2. Premarin 1.25 mg daily:  She was advised on the use of Premarin,      and in three to four weeks we will reassess for symptomatology.   DISCHARGE FOLLOWUP:  She was asked to return to the office in four weeks  for her postoperative visit in GYN clinic at [facility].   All questions were answered to the satisfaction of said patient and the  patient verbalized understanding of same.      Ginger Carne, MD  Electronically Signed    SHB/MEDQ  D:  11/08/2006  T:  11/08/2006  Job:  [id number]

## 2010-07-19 NOTE — Assessment & Plan Note (Signed)
[facility]                         GASTROENTEROLOGY OFFICE NOTE   NAME:[name], [name] SOUTHARD                    MRN:          [id number]  DATE:12/21/2006                            DOB:          March 19, 1975    GI PROBLEM LIST:  Recent nausea, vomiting episode, likely  gastroesophageal reflux disease.  Esophagogastroduodenoscopy September  2008 showed 2-cm hiatal hernia.   INTERVAL HISTORY:  I last saw [name] [name] at the time of her hospitalization.  She was status post a recent gynecologic surgery and that, possibly, was  contributing.  Esophagogastroduodenoscopy summarized above showed a  hiatal hernia, and I suspect she did not have esophagitis, but I suspect  that she does have some nonerosive gastroesophageal reflux disease.  Since she left the hospital, she has been taking no nausea medicines,  and she stopped taking proton pump inhibitors.  She still does have  esophageal burning at times.  She has noticed caffeine, definitely, can  worsen things.   CURRENT MEDICINES:  1. Hyzaar.  2. Lamictal.   PHYSICAL EXAMINATION:  Weight 320 pounds, blood pressure 120/60, pulse  88.  CONSTITUTIONAL:  Obese, otherwise well-appearing.  ABDOMEN:  Soft, nontender, nondistended, normal bowel sounds.   ASSESSMENT AND PLAN:  A 35 year old woman with improved nausea,  vomiting, likely nonerosive reflux disease, hiatal hernia.   I have given [name] [name] a gastroesophageal reflux disease handout, and I  have recommended that she stay on a proton pump inhibitor taken 20 to 30  minutes prior to her breakfast meal.  I recommended weight loss as  another way to potentially control her acid symptoms.  She will return  to see me on an as-needed basis.     Rachael Fee, MD  Electronically Signed    DPJ/MedQ  DD: 12/21/2006  DT: 12/22/2006  Job #: [id number]   cc:   Ginger Carne, MD

## 2010-07-19 NOTE — Op Note (Signed)
NAMEGRACEANNA, THEISSEN             ACCOUNT NO.:  [id number]   MEDICAL RECORD NO.:  [id number]          PATIENT TYPE:  AMB   LOCATION:  [facility]                         FACILITY:  [facility]   PHYSICIAN:  Maretta Bees. Vonita Moss, M.D.DATE OF BIRTH:  April 12, 1975   DATE OF PROCEDURE:  02/20/2007  DATE OF DISCHARGE:                               OPERATIVE REPORT   PREOPERATIVE DIAGNOSIS:  Rule out interstitial cystitis.   POSTOPERATIVE DIAGNOSIS:  Rule out interstitial cystitis.   PROCEDURES:  1. Cystoscopy.  2. Hydraulic overdistention of bladder.  3. Cold cup bladder biopsy.   SURGEON:  Maretta Bees. Vonita Moss, M.D.   ANESTHESIA:  General.   INDICATIONS:  This lady has had a long history of frequency and nocturia  and urgency incontinence and bladder pain.  Her pelvic pain symptom  score was very high at 24.  I was suspicious of IC and she was counseled  about cystoscopy, HOD and bladder biopsy and advised about the risk of  postop pain and hematuria and small risk of bladder rupture.   PROCEDURE:  The patient was brought to the operating room and placed in  lithotomy position.  External genitalia were prepped and draped in the  usual fashion.  She was cystoscoped and the bladder was normal.  She  only held 400 mL of irrigating fluid and after looking back in, she had  widespread submucosal petechiae and hemorrhage consistent with  interstitial cystitis.  Cold cup bladder biopsies were taken from the  base and the three biopsy sites were fulgurated with the Bugbee  electrode.  UroJet Xylocaine jelly was injected per urethra and she was  given 30 mg of Toradol IV and then she was taken to the recovery room in  good condition, having tolerated the procedure well.      Maretta Bees. Vonita Moss, M.D.  Electronically Signed     LJP/MEDQ  D:  02/20/2007  T:  02/20/2007  Job:  [id number]

## 2010-07-19 NOTE — Group Therapy Note (Signed)
[name] [name], [name] [name] NO.:  [id number]   MEDICAL RECORD NO.:  [id number]          PATIENT TYPE:  WOC   LOCATION:  [facility]                   FACILITY:  [facility]   PHYSICIAN:  Karlton Lemon, MD      DATE OF BIRTH:  05-13-1975   DATE OF SERVICE:                                  CLINIC NOTE   CHIEF COMPLAINT:  Postoperative check.   HISTORY OF PRESENT ILLNESS:  This is a 35 year old African-American  female status post LAVH with BSO on November 06, 2006. The patient  states that she is feeling fatigued and having hot flashes today. She  also complains of an increased appetite. She states that she  discontinued her Premarin about three weeks postoperatively. She is also  complaining of some mild back pain, which she describes as premenstrual  symptoms. The patient did have some nausea and vomiting about 13 days  postoperatively requiring hospitalization. During this hospitalization,  she had a normal CT scan and upper endoscopy was performed with evidence  of hiatal hernia seen. The patient has followed with gastroenterology  and is currently on Nexium daily. The patient states that her symptoms  of GERD have improved. She denies continued pelvic pain, but does  complain about back pain. She also states that she has had a small  amount of spotting on Wednesday this week, which was two days ago. She  denies other vaginal discharge, fevers, constipation, diarrhea, or other  complaints.   PAST MEDICAL HISTORY:  1. Hypertension.  2. Bipolar disorder.  3. Chronic back pain.  4. Dyslipidemia.   PAST SURGICAL HISTORY:  1. Bilateral tubal ligation.  2. Cryotherapy.  3. LAVH and BSO.   CURRENT MEDICATIONS:  1. Hyzaar 100/25 1 daily.  2. Lamictal 200 mg daily.  3. Lomotil 100 mg daily.  4. Nexium 1 daily, unknown dose.   ALLERGIES:  OXYCODONE and DILAUDID.   PHYSICAL EXAMINATION:  GENERAL:  This is a well appearing obese female  in no distress.  VITAL SIGNS:   Pulse 91, blood pressure 124/75, weight 316 pounds.  ABDOMEN:  Obese. Soft and nontender to palpation.  GENITOURINARY:  Sterile speculum exam is performed. Vulva appears  normal. Vaginal mucosa is pink and moist. There is no discharge noted.  Vaginal cuff appears to be healing well, but it is slightly difficult to  visualize secondary to redundant vaginal tissue. Bimanual exam is  performed with no uterus palpable. The remainder of the bimanual exam is  difficult secondary to body habitus.  EXTREMITIES:  No edema noted.   ASSESSMENT AND PLAN:  This is a 35 year old African-American female here  for an 8 week postoperative check. The patient si complaining of fatigue  and decreased appetite and hot flashes.  1. Recommend continuance of Premarin 1 mg p.o. daily, prescription is      given.  2. Recommended supplementation with calcium and vitamin D.  3. We will check a TSH and CBC level for other causes of her fatigue.  4. The patient's pelvic exam is normal today and she may discontinue      pelvic rest. She may resume sexual activity  as desired. She was      counseled to use lubrication for two weeks after restarting      Premarin to decrease vaginal dryness.           ______________________________  Karlton Lemon, MD     NS/MEDQ  D:  12/28/2006  T:  12/29/2006  Job:  [id number]

## 2010-07-19 NOTE — Discharge Summary (Signed)
NAMEGIARA, [name] [name]             ACCOUNT NO.:  [id number]   MEDICAL RECORD NO.:  [id number]          PATIENT TYPE:  INP   LOCATION:  9308                          [facility]   PHYSICIAN:  Ginger Carne, MD  DATE OF BIRTH:  01-Jul-1975   DATE OF ADMISSION:  11/19/2006  DATE OF DISCHARGE:  11/21/2006                               DISCHARGE SUMMARY   REASON FOR HOSPITALIZATION:  Nausea, vomiting.   IN-HOSPITAL PROCEDURES:  1. Flat plate roentgenogram of abdomen.  2. Consultation with gastroenterology and upper endoscopy.   FINAL DIAGNOSIS:  1. Gastroesophageal reflux disease.  2. Hiatal hernia.   HOSPITAL COURSE:  This patient is a 35 year old African American female  who had had a laparoscopic-assisted vaginal hysterectomy and bilateral  salpingo-oophorectomy on November 06, 2006.  At the time of discharge  she had done well, was readmitted between September 6 and 8 with nausea  and vomiting and diarrhea.  CT of the abdomen and contrast were normal  without signs of intestinal perforation, obstruction, and no evidence  for gallstones or biliary dilation.  She was readmitted to the hospital  on November 19, 2006 with complaint of nausea and vomiting.  Her liver  function tests were normal.  Lipase, amylase were normal and all other  laboratory values within normal limits.  Gastroenterology consultation  was obtained by Dr. Christella Hartigan and an upper endoscopy was performed on  November 20, 2006, which demonstrated evidence of a hiatal hernia and  gastroesophageal reflux disease.  The patient was also given a Dulcolax  suppository and one bottle of magnesium citrate for evacuation of stool.  On the morning of discharge the patient felt significantly better.  She  was afebrile, abdomen soft.  She was able to eat solid foods and drink  fluids.  She she was advised of her diagnoses.   Her plan for discharge included sleeping with several pillows to  maintain a 30-degree elevation,  avoid fatty and fried foods, Prilosec 20  mg twice a day, and to keep her regularly-scheduled appointment with the  GYN clinic for postoperative evaluation.  She will continue her home  medications including:   1. Hyzaar 100/25 mg daily.  2. Lamictal 100 mg daily.  3. Soma 350 mg every 8 hours.   The nature of said diagnoses discussed with the patient.  She was  discharged with routine instructions including contacting the office for  temperature elevation above 100.4 degrees Fahrenheit, increasing  abdominal or incisional pain, or any other GI or GU concerns.      Ginger Carne, MD  Electronically Signed     SHB/MEDQ  D:  11/21/2006  T:  11/21/2006  Job:  [id number]

## 2010-07-19 NOTE — Op Note (Signed)
[name], [name] [name]             ACCOUNT NO.:  [id number]   MEDICAL RECORD NO.:  [id number]          PATIENT TYPE:  AMB   LOCATION:  [facility]                           [facility]   PHYSICIAN:  Ginger Carne, MD  DATE OF BIRTH:  1976-01-29   DATE OF PROCEDURE:  11/06/2006  DATE OF DISCHARGE:                               OPERATIVE REPORT   PREOPERATIVE DIAGNOSES:  Chronic pelvic pain, severe dysmenorrhea and  dyspareunia and menorrhagia.   POSTOPERATIVE DIAGNOSES:  Chronic pelvic pain, severe dysmenorrhea and  dyspareunia and menorrhagia.  Stage I endometriosis.   PROCEDURE:  Laparoscopic-assisted vaginal hysterectomy, bilateral  salpingo-oophorectomy.   SURGEON:  Blima Rich, M.D.   ASSISTANT:  Johnella Moloney, M.D.   ANESTHESIA:  General.   SPECIMEN:  Uterus, cervix, right and left tubes, and ovaries.   ESTIMATED BLOOD LOSS:  200 mL.   COMPLICATIONS:  None immediate.   OPERATIVE FINDINGS:  Laparoscopic evaluation revealed areas of red  punctate lesions consistent with endometriosis.  However, the remainder  of the pelvis was unremarkable for adhesions or adhesive disease  suggestive of chronic pelvic inflammatory disease.  Large and small  bowel are grossly normal.  Upper abdomen normal.  No evidence of  femoral, inguinal or obturator hernias.  Uterus was enlarged about 10  weeks' size with posterior bulging consistent with adenomyosis.  Both  tubes and ovaries appeared normal.   OPERATIVE PROCEDURE:  The patient was prepped and draped in usual  fashion and placed in lithotomy position.  Betadine solution used for  antiseptic, and the patient was catheterized prior to procedure.  After  adequate general anesthesia, a Pelosi uterine manipulator was placed in  the endocervical canal, and a single-tooth tenaculum was placed on the  anterior lip of the cervix.  Afterwards, a vertical infraumbilical  incision was made and the Veress needle placed in the abdomen.   Opening  and closing pressures were 10 and 15 mmHg.  Two 5-mm ports were made in  the left lower quadrant and left hypogastric regions under direct  visualization.  After inspecting the pelvic contents, the  infundibulopelvic ligaments bilaterally were bipolar cauterized and cut.  Ureters were identified in the pelvic region.  Afterwards, the round  ligaments were similarly bipolar cauterized and cut.  Following this,  the vaginal portion of the procedure continued.  Double-toothed  tenaculum was placed on the anterior and posterior lips of the cervix.  Marcaine with epinephrine used as a paracervical block.  Afterwards, 2  cm of anterior and posterior vaginal epithelium were incised  transversely.  The peritoneal reflections identified and opened without  injury to their respective organs.  Afterwards, the uterosacral cardinal  ligament complexes were clamped, cut and ligated with 0 Vicryl suture.  This was then extended to the uterine vasculature and ascending branches  in a standard Richardson technique.  Remnants of the broad ligaments  were similarly clamped, cut and ligated with 0 Vicryl suture.  Uterus,  cervix, tubes and ovaries were then removed.  Bleeding points  hemostatically checked.  Blood clots removed.  Closure of the posterior  parietal peritoneum  with the vaginal cuff in one layer of 0 Vicryl  running interlocking suture. Relaparoscoping the patient revealed no  evidence of injuries or active bleeding.  Irrigation used.  Following  this, irrigant removed.  Closure of the 10-mm fascia site with 0 Vicryl  suture and 4-0 Vicryl for subcuticular closure.  Sponge and instrument  count were correct.  The patient tolerated the procedure well and  returned to the post-anesthesia recovery room in excellent condition.      Ginger Carne, MD  Electronically Signed     SHB/MEDQ  D:  11/06/2006  T:  11/06/2006  Job:  [id number]

## 2010-07-19 NOTE — Group Therapy Note (Signed)
NAMETHEDA, PAYER             ACCOUNT NO.:  [id number]   MEDICAL RECORD NO.:  [id number]          PATIENT TYPE:  WOC   LOCATION:  [facility]                   FACILITY:  [facility]   PHYSICIAN:  Johnella Moloney, MD        DATE OF BIRTH:  [birth year]   DATE OF SERVICE:  06/28/2007                                  CLINIC NOTE   REASON FOR VISIT:  Vaginal discharge and irritation.   HISTORY OF PRESENT ILLNESS:  This is a 35 year old morbidly obese  African-American female who presents for evaluation of vaginal discharge  and irritation which has been present for approximately 1-1/2 weeks.  She reports a whitish discharge with fishy odor but denies any  associated fever, chills, abdominal pain, nausea, vomiting or diarrhea.  She declines need for STD testing today, as she is in a monogamous  relationship and has not had any STDs on prior testing.  Of note, the  patient is status post laparoscopic assisted vaginal hysterectomy and  bilateral salpingo-oophorectomy in September 2008, and thus does not  need further annual Pap smear screenings.  She already has an  appointment on 07/12/2007, for her annual exam.  The patient does report  a history of frequent bacterial vaginosis infections and is concerned  that this may be the problem today.  Of note, she also has a problem  with urinary incontinence, for which she is followed by [facility]  and undergoing conservative medical therapy prior to considering  surgical treatment.  She does noted that she has frequent leakage of  urine and that this may contribute to her irritation and frequent  bacterial vaginosis infections.   Past medical history, family history and social history were reviewed  and unchanged.   CURRENT MEDICATIONS:  Lamictal, iron and Premarin.   ALLERGIES:  OXYCODONE, DILAUDID, MORPHINE, ACE INHIBITORS and PERCOCET.   PHYSICAL EXAMINATION:  VITAL SIGNS:  Temperature is 97.3, heart rate 83,  blood pressure 111/77,  respiratory rate 20.  Weight is 317.5 pounds,  height is 5 feet 8-1/2 inches.  GENERAL:  This is an obese appearing female in no acute distress.  HEART:  Regular rate and rhythm.  LUNGS:  Normal work of breathing.  ABDOMEN:  Normoactive bowel sounds, soft, nontender, nondistended but  morbidly obese.  GENITOURINARY:  Patient has normal external female genitalia without  lesions.  Vaginal mucosa is mildly irritated, but without lesions.  Patient does not have cervix given that she is status post hysterectomy.  Minimal scant white discharge is present in the vaginal vault and a wet  prep was obtained.  Shonna Chock test is negative.  There is a slight odor of  urine.  Bimanual exam reveals absent uterus and ovaries, and there is  significant pain.   DIAGNOSTIC DATA:  Wet prep results were reviewed in office given it is  Friday, and there was no evidence of Trichomonas, clue cells or hyphae.  I will send to lab for confirmation.   ASSESSMENT AND PLAN:  This is a 35 year old morbidly obese African-  American female who presents for evaluation of vaginal discharge and  irritation.   Vaginal discharge  and irritation:  Wet prep in office appears to be  negative.  We will send to lab for confirmation.  Suspect patient's  irritation and discharge are due to her urinary leakage problems.  As  she had only scant discharge on exam and wick test was also negative,  advised patient that no treatment is necessary today and to continue  following with her urologist for treatment of urinary incontinence.  Again, will send wet prep to laboratory for confirmation.   FOLLOWUP APPOINTMENT:  Patient has followup appointment in May for  annual physical.     ______________________________  Denny Peon Dr. Deirdre Peer    ______________________________  Johnella Moloney, MD    ED/MEDQ  D:  06/28/2007  T:  06/28/2007  Job:  [id number]

## 2010-07-19 NOTE — Group Therapy Note (Signed)
NAMEARLINDA, BARCELONA NO.:  [id number]   MEDICAL RECORD NO.:  [id number]          PATIENT TYPE:  WOC   LOCATION:  [facility]                   FACILITY:  [facility]   PHYSICIAN:  Ginger Carne, MD DATE OF BIRTH:  May 22, 1975   DATE OF SERVICE:  08/29/2007                                  CLINIC NOTE   The patient is here today for followup complaining of dyspareunia after  a laparoscopic-assisted vaginal hysterectomy and bilateral salpingo-  oophorectomy because of stage I endometriosis, chronic pelvic pain,  severe dysmenorrhea, dyspareunia and menorrhagia.  The patient has had  continued discomfort off and on.  She most recently was seen here for  sexually transmitted disease evaluation, which all returned normal,  including GC and chlamydia.  The patient states she does not have lower  abdominal pain or discomfort or vaginal discomfort when she is not  engaged in sexual activity.   SALIENT PHYSICAL FINDINGS:  Weight 318 pounds, blood pressure 102/63,  height 5 feet 8 inches.  EXTERNAL GENITALIA:  Vulva and vagina appear normal.  Vaginal cuff is  smooth with tenderness and the cuff is not foreshortened and is  approximately 9 to 10 cm.  Both adnexa are difficult to evaluate due to  her central obesity, but no pain is elicited.   IMPRESSION:  Dyspareunia.   PLAN:  I have started the patient on 5 mg of norethindrone acetate daily  to see if this will alleviate her discomfort.  I have asked her to  return in 4 months for followup.           ______________________________  Ginger Carne, MD     SHB/MEDQ  D:  08/29/2007  T:  08/29/2007  Job:  [id number]

## 2010-07-19 NOTE — Group Therapy Note (Signed)
NAMEESTELLAR, CADENA NO.:  [id number]   MEDICAL RECORD NO.:  [id number]          PATIENT TYPE:  WOC   LOCATION:  [facility]                   [facility]ACILITY:  [facility]   PHYSICIAN:  Karlton Lemon, MD      DATE OF BIRTH:  November 12, 1975   DATE OF SERVICE:                                  CLINIC NOTE   CHIEF COMPLAINT:  Cramping where cervix used to be.   HISTORY OF PRESENT ILLNESS:  This is a 35 year old African-American  female status post LAVH with BSO on November 06, 2006.  The patient  states that she has been having some tingy blood-colored discharge for  the past month.  She states that she puts a pad on, but does not note  any spotting on the pad.  She states that she mostly noticed this tingy  blood-colored discharge when she is wiping.  She also states that she is  having intermittent cramping and lower pelvic pain.  She states that the  cramping pain is worse when her bladder is full.  She notes no dysuria  or hematuria.  She also states that her symptoms of hot flashes, mood  swings and deceased libido have been continuing to bother her despite  being back on Premarin 1 mg daily.  She states that she would like to  have an increase in her estrogen today.   PAST MEDICAL HISTORY:  1. Hypotension.  2. Bipolar disorder.  3. Chronic back pain.  4. Dyslipidemia.   PAST SURGICAL HISTORY:  1. Bilateral tubal ligation.  2. Cryotherapy.  3. LAVH with BSO.   CURRENT MEDICATIONS:  1. Hyzaar 100/25 1 daily.  2. Lamictal 200 daily.  3. Premarin 1 mg daily.   ALLERGIES:  1. OXYCODONE.  2. DILAUDID.   PHYSICAL EXAMINATION:  GENERAL:  This is a well-appearing, obese female  in no distress.  VITAL SIGNS:  Temperature 98.8, pulse 86, blood pressure 112/73, weight  117.5 pounds.  ABDOMEN:  Obese, soft, nontender to palpation.  GENITOURINARY:  Speculum exam was performed.  Vulva appears normal,  though difficult to visualize due to the girth of her legs.  Vaginal  mucosa is pink and moist.  No discharge noted.  Vaginal cuff has some  granulation tissue posteriorly and silver nitrate is applied to the  granulation tissue.  There is a small amount of bleeding noted prior to  placement of the silver nitrate.  No vaginal lesions are noted.   ASSESSMENT:  This is a 35 year old African-American female with  postoperative granulation tissue of the vaginal cuff, pelvic pain,  status post laparoscopic-assisted vaginal hysterectomy and bilateral  salpingo-oophorectomy.   PLAN:  1. The patient has been treated with silver nitrate to the granulation      tissue today.  2. We will check urinalysis due to increased discomfort with bladder      filling and refer to urology for evaluation for interstitial      cystitis.  3. We will switch her hormone replacement to Premarin 1.25 mg with      methyltestosterone 2.5 mg 1 tablet daily.  4. We will follow the patient in  3 weeks to follow up for resolution      of her current symptoms.           ______________________________  Karlton Lemon, MD     NS/MEDQ  D:  01/18/2007  T:  01/18/2007  Job:  [id number]

## 2010-07-22 NOTE — Procedures (Signed)
[place]. [facility]  Patient:    [name] [name], [name] [name]                    MRN: [id number] Proc. Date: 06/15/99 Adm. Date:  [id number] Disc. Date: [id number] Attending:  McDiarmid, Leighton Roach CC:         Jamey Reas, M.D., [facility], [place]                           Procedure Report  DATE OF BIRTH: Oct 25, 1975  REFERRING PHYSICIAN: Jamey Reas, M.D., [facility], [facility].  NAME OF PROCEDURE:  Esophagogastroduodenoscopy.  ENDOSCOPIST: Anselmo Rod, M.D.  INSTRUMENT USED: Olympus video panendoscope.  INDICATIONS FOR PROCEDURE: Severe epigastric pain with an unrevealing abdominal  ultrasound.  Rule out peptic ulcer disease, esophagitis, gastritis, etc.  PREPROCEDURE PREPARATION:  Informed consent was procured from the patient.  The  patient fasted for eight hours prior to the procedure.  PREPROCEDURE PHYSICAL EXAMINATION:  VITAL SIGNS:  The patient has stable vital signs.  NECK:  Neck is supple.  CHEST: Chest is clear to auscultation.  HEART:  S1, S2 regular.  ABDOMEN: Abdomen obese with epigastric tenderness on palpation with guarding. o rebound.  No hepatosplenomegaly.  DESCRIPTION OF THE PROCEDURE: The patient was placed in the left lateral decubitus position and sedated with 1.5 mg of Versed intravenously.  She had received Demerol and Phenergan in the emergency room, and therefore no additional sedation was given.  Once the patient was adequately sedated the Olympus panendoscope was advanced through the mouthpiece, over the tongue and into the esophagus under direct vision.  The entire esophagus appeared normal without evidence of ring, stricture, masses, lesions, or esophagitis. The scope was then advanced into the stomach.  The entire gastric mucosa appeared healthy with no evidence of erosions, ulcerations, masses or polyps. The duodenal bulb and the small bowel distal to  the bulb up to 60 cm  appeared normal.  There was no obvious obstruction.  The patient was retching during the procedure and a full retroflexed view was not clearly obtained, but there was no evidence of hiatal hernia on gross examination.  IMPRESSION: Normal EGD.  RECOMMENDATIONS:  Proceed with HIDA scan of the gallbladder with CCK injection o further rule out biliary dyskinesia. DD:  06/15/99 TD:  06/16/99 Job: [id number]

## 2010-07-22 NOTE — H&P (Signed)
[name] [name], VANDERWEELE             ACCOUNT NO.:  [id number]   MEDICAL RECORD NO.:  [id number]          PATIENT TYPE:  MAT   LOCATION:  [facility]                          FACILITY:  [facility]   PHYSICIAN:  Hal Morales, M.D.DATE OF BIRTH:  Oct 27, 1975   DATE OF ADMISSION:  03/12/2004  DATE OF DISCHARGE:                                HISTORY & PHYSICAL   [name] [name] is a 35 year old, single, black female, gravida 5, para 3-0-2-  3, admitted for presumptive PID.  She reports persistent lower abdominal  pain for the last three days, rating it an 8 out of 10 with no relief with  p.o. Darvocet or Percocet.  She has been evaluated two times at [facility] and one time at [facility] for the same pain and was  treated, on the morning of March 11, 2004, with IV Rocephin and 1 gram of  p.o. Zithromax and was sent home on doxycycline.  She has, however, not been  able to keep the doxycycline down due to persistent nausea and vomiting also  for the last three days.  She denies any dysuria, constipation, but does  have a little bit of diarrhea.  Her LMP was March 04, 2005, and her  contraception is bilateral tubal ligation.   OBSTETRICAL/GYNECOLOGICAL HISTORY:  1.  She is a gravida 5, para 3-0-2-3 all normal spontaneous vaginal      deliveries.  2.  She had a bilateral tubal ligation, after her last delivery.  3.  She is status post cryosurgery and was diagnosed with small fibroids on      ultrasound at [facility].   MEDICAL HISTORY:  1.  History of hypercholesterolemia.  2.  Hypertension.  3.  Depression.   PAST SURGICAL HISTORY:  1.  Bilateral tubal ligation.  2.  Cryosurgery.   HOSPITALIZATIONS:  Her only hospitalizations have been for childbirth.   SOCIAL HISTORY:  She is single and lives with her mother and her children.   PHYSICAL EXAMINATION:  VITAL SIGNS:  Her temperature is 101.7.  Her pulse is  80.  Her respirations are 22.  Her blood  pressure is 106/58.  GENERAL APPEARANCE:  Uncomfortable.  HEENT:  Within normal limits.  LUNGS:  Clear.  HEART:  Regular rhythm and rate.  BREASTS:  Soft and nontender.  BACK:  Normal without CVA tenderness.  ABDOMEN:  Soft.  Positive bowel sounds.  Obese with generalized  lower  abdominal tenderness.  GENITALIA:  Within normal limits.  PELVIC:  Her cervix has greenish discharge.  Positive cervical motion  tenderness with adnexa generalized tenderness without any palpable masses.  Uterus is normal shape, size, and consistency and slightly tender.  RECTAL:  Deferred.  EXTREMITIES:  Within normal limits.  SKIN:  Warm and dry.   LABS:  Positive  gonorrhea on March 11, 2004 evaluation.  Negative for  Chlamydia on March 11, 2004.  Her UPT is negative today.  Her WBC count  today is 5.1, hemoglobin is 11.5, platelets 239,000, neutrophils are 72%.  SGOT is 16, SGPT is 12, and her potassium  is 3.3.   ASSESSMENT:  1.  Presumptive pelvic inflammatory disease.  2.  History of chronic hypertension.  3.  History of depression.   PLAN:  Per consult with Dr. Pennie Rushing to admit for IV antibiotic therapy and  observation due to failure of Rocephin and Zithromax to treat her symptoms.     Shel   SJD/MEDQ  D:  03/12/2004  T:  03/12/2004  Job:  [id number]

## 2010-07-22 NOTE — Discharge Summary (Signed)
[place]. [facility]  Patient:    [name] [name], [name] [name]                    MRN: [id number] Adm. Date:  [id number] Disc. Date: [id number] Attending:  McDiarmid, Leighton Roach. Dictator:   Lavonia Dana, M.D. CC:         Huey Bienenstock McDiarmid, M.D.                           Discharge Summary  DISCHARGE DIAGNOSIS:  Pelvic inflammatory disease.  HISTORY OF PRESENT ILLNESS:  Please see History and Physical for complete details.  Essentially, the patient began with abdominal pain four days prior to admission and went to the emergency room two days prior to admission with right upper quadrant abdominal pain and epigastric pain.  She had had fever and at that time was given GI cocktail and Phenergan without a specific diagnosis and was seen in the [facility] for followup on Tuesday, April 10.  She was set up to see Dr. Donnajean Lopes and treated for gastritis.  Her pain increased causing her to see Dr. Donnajean Lopes on April 11, where she underwent upper endoscopy which was completely negative as well as an abdominal ultrasound that was negative.  A HIDA scan showed equivocal ejection fraction of the gallbladder at 34% and after no specific etiology was found, she was admitted for further evaluation.  Of note, she had had a past medical history of a rupture ovarian cyst and questionable PID.  PROCEDURE:  Upper endoscopy.  Normal exam.  HOSPITAL COURSE:  The patient was diagnosed clinically with pelvic inflammatory disease after negative GI workup and was treated with IV cefotetan and doxycycline for two days.  She began to feel much better and did not have any evidence of fever.  She was discharged with p.o. regimen for pelvic inflammatory disease for 14 days.  Her gonorrhea and chlamydia cultures were both negative and she had a normal wet-prep.  DISCHARGE MEDICATION:  Doxycycline.  FOLLOWUP:  Follow up with Dr. Roseanne Reno at the [facility]. DD:  08/17/99 TD:   08/21/99 Job: [id number]

## 2010-07-22 NOTE — H&P (Signed)
[place]. [facility]  Patient:    [name] [name], [name] [name]                    MRN: [id number] Adm. Date:  [id number] Attending:  McDiarmid, Leighton Roach. Dictator:   Lavonia Dana, M.D.                         History and Physical  CHIEF COMPLAINT: Refractory abdominal pain.  HISTORY OF PRESENT ILLNESS: The patient began with abdominal pain four days ago and went to the emergency room two days ago with right upper quadrant abdominal pain and epigastric pain.  She had also reportedly had fever at that time and has since defervesced.  She was given a GI cocktail in the emergency room with Phenergan without specific diagnosis and was sent for follow-up at the [facility] on Tuesday, June 14, 1999.  At that time an outpatient appointment was scheduled with Dr. Anselmo Rod and she was treated for gastritis.  Her pain increased to the point of causing her to return to the emergency room and she saw Dr. Elsie Amis on June 15, 1999.  She underwent an upper endoscopy which was completely negative as well as an abdominal ultrasound that was negative.  A HIDA scan showed equivocal ejection fraction of the gallbladder at 34%.  With all of these tests not suggestive of any specific etiology she was then asked to be evaluated by family practice.  She basically has had no p.o.s since Tuesday evening.  REVIEW OF SYSTEMS: No weight loss.  No palpitations or chest pain.  No vomiting but some dry heaves.  No diarrhea.  No bloody emesis.  NEUROLOGIC: Negative. MUSCULOSKELETAL: Some back and pelvic pain.  ENT: No sore throat. GENITOURINARY: She has had pelvic discharge since Sunday and has significant suprapubic discomfort along with urinary frequency but no pain or urgency noted.  RESPIRATORY: Without shortness of breath or dyspnea.  SKIN: Negative.  PSYCHIATRIC: No history of anxiety or depression.  HEMATOLOGY: No history of sickle cell disease.  PAST  MEDICAL HISTORY:  1. Ruptured ovarian cyst.  2. Question of PID.  3. She had cervical dysplasia requiring cryotherapy in 1992.  MEDICATIONS: Claritin D.  ALLERGIES: No known drug allergies.  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  She lives with her mother and two children and two sisters.  She has had one sexual partner for the past our years and is unsure of his infectious status.  She reports no history of gonorrhea or Chlamydia.  FAMILY HISTORY: Significant for diabetes, hypertension, breast cancer and cervical cancer in aunts, and colon carcinoma.  PHYSICAL EXAMINATION:  VITAL SIGNS: She is afebrile.  Blood pressure 126/75, pulse 78, respirations 24. Oxygen saturation 97% on room air.  GENERAL: She appears curled and moaning in pain.  Judgment and insight within normal limits and mental status within normal limits.  HEENT:  Sclerae anicteric.  Conjunctivae with no injection.  EOMI. PERRL. Oropharynx moist with no erythema or exudate.  NECK: Supple, no JVD, no thyromegaly, no supraclavicular or cervical lymphadenopathy.  CHEST: Clear to auscultation bilaterally.  No wheezes, rales, or rhonchi.  CARDIOVASCULAR: Normal S1 and S2, regular rhythm.  EXTREMITIES: No lower extremity edema.  MUSCULOSKELETAL: Without swollen or erythematous joints.  Achilles tendons without evidence of inflammation.  ABDOMEN: Soft with  hypoactive bowel sounds and minimal right upper quadrant tenderness.  No hepatosplenomegaly.  There is significant suprapubic tenderness  noted.  SKIN: Negative.  RECTAL: Deferred.  NEUROLOGIC: Nonfocal.  PELVIC: Normal external vulva with mucopurulent discharge from the cervix and significant cervical motion tenderness.  LABORATORY DATA: WBC 4.4, hemoglobin 11.6, hematocrit 34.2, platelets 207,000.  Lipase 17.  Sodium 138, potassium 3.5,  bicarbonate 24, BUN 11, creatinine 0.7.  Calcium 8.7, total protein 6.9, albumin 3.4, AST 21, ALT 15, total bilirubin 0.6, direct bilirubin less than 0.1.  Urinalysis showed a specific gravity of 1.026,  protein 30, negative leukocyte esterase, 0-5 wbc/hpf, 0-5 rbc/hpf.  Abdominal ultrasound was negative.  Upper endoscopy negative.  CLOtest pending.  HIDA scan showed gallbladder ejection fraction 34%.  ASSESSMENT/PLAN: The patient is a 35 year old black female with abdominal pain without obvious etiology after an extremely thorough gastrointestinal work-up.  Could be biliary dyskinesia but did not feel surgical consultation is warranted at this time as the patient clinically appears to have pelvic inflammatory disease, possibly her right upper quadrant tenderness could be related to Fitz-Hugh-Curtis type syndrome.  At this time without a surgical abdomen will treat empirically or pelvic inflammatory disease with cefotetan and doxycycline along with IV fluids as the patient cannot tolerate p.o.s, and treat symptomatically with Phenergan and  morphine.  If absolutely no improvement or worsening of clinical status will consider surgical consultation and also consider pelvic ultrasound or pelvic CT to rule out some other GYN abnormality such as an abscess or torsed ovary, etc. DD:  06/15/99 TD:  06/16/99 Job: [id number]

## 2010-07-22 NOTE — H&P (Signed)
[facility]  Patient:    [name] [name], [name] [name]                      MRN: [id number] Adm. Date:  03/17/00 Attending:  Janine Limbo, M.D. Dictator:   Nigel Bridgeman, C.N.M.                         History and Physical  DATE OF BIRTH:                07-17-1975                                [name] [name] is a 35 year old gravida 5, para 2-0-2-2 at 45 1/7 weeks who presents with sporadic uterine contractions. Cervix has been 3-4 cm in the office.  Patient has been followed for proteinuria over the last two to three weeks.  She had a 24 hour urine that showed 345 mg of protein on a 24 hour specimen.  She has been followed with biweekly NSTs.  She had an ultrasound on December 31 that showed normal growth and fluid.  She had negative ANA, LAC, and ______ titers.  Pregnancy has otherwise been remarkable for proteinuria, history of rapid labor, history of preterm labor with a term delivery, history of cryo, large body habitus, first trimester pain issues, syphilis diagnosed in the first trimester with resolution of titers, two abortions, history of peptic ulcer disease, musculoskeletal issues, wants tubal ligation.  PRENATAL LABORATORIES:        Blood type O+.  Rh antibody negative.  VDRL reactive with positive titers noted.  She was administered Bicillin in a series.  Her RPR then was negative in the third trimester.  Rubella titer was positive.  Hepatitis B surface antigen negative.  HIV nonreactive.  Parvo titer showed immune status.  GC and chlamydia cultures were negative.  Pap was normal.  An 18 week glucola challenge was normal.  A 28 week glucola challenge was normal.  AFP was normal.  Hemoglobin upon entry into practice was 11.3. It was 10.1 at 28 weeks.  Platelets were 231,000 at new OB and were within normal limits in the third trimester.  EDC of March 16, 2000 was established by ultrasound in the first trimester.  HISTORY OF PRESENT  ILLNESS:   Patient entered care at approximately 8 weeks. She had an early ultrasound.  She did have some hyperemesis in the early trimesters.  She had syphilis diagnosed in the first trimester and was treated with the usual regimen.  She had another ultrasound at 18 weeks which showed normal growth and fluid.  She had some sporadic musculoskeletal issues during her pregnancy.  She was treated for a UTI at 26 weeks.  She had parvo titers done at 28 weeks secondary to questionable exposure.  These did, with repeat, document immune status.  She did have some contractions in the early third trimester.  She continued with musculoskeletal significant issues.  At 36 weeks she had 1+ protein on a voided specimen and 1+ protein on a catheter specimen.  A 24 hour urine was started and PIH laboratories were done.  A 24 hour showed 345 mg of protein.  She was followed from that time with NSTs twice weekly and an ultrasound that showed normal findings.  She also had negative lupus titers.  She had signed tubal papers on  February 14, 2000.  She had another ultrasound at 38 weeks which also showed normal growth and fluid. She has had ongoing pelvic bone pain.  Cervix was 3 cm in the office earlier this week.  PAST OBSTETRICAL HISTORY:     In 1991 she had a first trimester SAB, 1992 she had a first trimester TAB, in 1998 she had a vaginal birth of a female infant weight 6 pounds 12 ounces at term.  She labored four to five hours.  She had IV pain medication for pain and had a 15 minute second stage.  In the year 2000 she had a vaginal birth of a female infant weight 6 pounds 8 ounces at [redacted] weeks gestation.  She was induced secondary to severe dental issues.  She used Stadol.  She had a four minute second stage.  She had hyperemesis during her previous pregnancies.  She did have anemia as well.  She had preterm labor with both her previous pregnancies, but never had any cervical change.  PAST MEDICAL  HISTORY:         She had cryo surgery in 1994.  Followup Pap smears have been normal.  She had yeast infections occasionally.  She had a history of PID in 1993.  ALLERGIES:                    No known drug allergies.  FAMILY HISTORY:               Her maternal grandfather had hypertension.  Her maternal great grandfather, her paternal grandfather, her paternal grandmother, maternal grandfather, and maternal grandmother are diabetic.  Her maternal cousin and maternal aunt had ovarian cancer.  GENETIC HISTORY:              Remarkable for father of the babys uncle having congenital heart disease.  SOCIAL HISTORY:               Patient is single.  Father of the baby has been involved in the pregnancy, but is not currently present with her.  Patient has her GED and some college.  She is employed as a Technical brewer.  Her mother and other family members and friends are very supportive.  She is [place] of the [place] faith.  She denies any alcohol, drug, or tobacco use during this pregnancy.  She did have some exposure to doxycycline, morphine, and Claritin in the first trimester prior to knowing she was pregnant.  PHYSICAL EXAMINATION:  VITAL SIGNS:                  Stable.  Patient is afebrile.  HEENT:                        Within normal limits.  LUNGS:                        Breath sounds are clear.  HEART:                        Regular rate and rhythm without murmur.  BREASTS:                      Soft and nontender.  ABDOMEN:                      Fundal height is approximately 38 cm.  Estimated fetal  weight is 7-7.5 pounds.  Uterine contractions are irregular and mild.  PELVIC:                       Cervical examination:  4 cm, 80%, vertex at -1 to 0 station.  Fetal heart rate is reactive with a baseline 150-160 with no decelerations.  Contractions are irregular and mild.  Deep tendon reflexes are 1+ without clonus.  There is a trace edema  noted.  IMPRESSION:                   1. Intrauterine pregnancy at term.                               2. History of proteinuria.                               3. Favorable cervix.                                4. Questionable early labor.                               5. Desires tubal ligation.                               6. History of syphilis.  PLAN:                         1. Admit to birthing suite for consult with Dr.                                  Stefano Gaul as attending physician.                               2. Plan artificial rupture of membranes.                               3. Clean catch urine for protein and PIH                                  laboratories.                               4. Will plan tubal ligation postpartum.                               5. Anticipate normal spontaneous vaginal birth. D:  03/17/00 TD:  03/17/00 Job: [id number]

## 2010-07-22 NOTE — Consult Note (Signed)
[place]. [facility]  Patient:    [name] [name], [name] [name]                    MRN: [id number] Proc. Date: 11/09/99 Adm. Date:  [id number] Attending:  Hilario Quarry CC:         Cecilio Asper, M.D.  Janine Limbo, M.D.   Consultation Report  REASON FOR EVALUATION:  Chest pain.  CONCLUSIONS: 1. Atypical chest discomfort, etiology uncertain.  Probably musculoskeletal.    Rule out gastrointestinal reflux.  No evidence of structural or ischemic    heart disease identified.  No evidence of congenital heart disease.  No    clinical evidence of pulmonary embolism identified. 2. Pregnancy, 22 weeks.  RECOMMENDATIONS: 1. A 2D echocardiogram has been performed and does not reveal any significant    structural and cardiac abnormalities. 2. Workup including arterial blood gas does not reveal anything to suggest    pulmonary embolus. 3. No further cardiac evaluation is felt necessary.  Possible GI consultation    or pulmonary consultation should be considered if the patient continues to    have dyspnea and chest discomfort.  COMMENTS:  The patient is 35 years of age.  She is [redacted] weeks pregnant.  Over a two-month period of time, she has had intermittent right parasternal discomfort that comes on spontaneously, lasts several seconds, then resolves. She has been having it off and on daily for the past two weeks.  There is also some associated shortness of breath.  The discomfort that she had today was the worst discomfort that she has had during her pregnancy.  She has not had this kind of discomfort with previous pregnancies.  She denies orthopnea, PND, and has not had palpitations.  She has not had syncope.  PAST MEDICAL HISTORY: 1. History of a ruptured right ovarian cyst. 2. History of pelvic inflammatory disease. 3. History of cervical dysplasia.  ALLERGIES:  None to medications.  SHRIMP causes tongue to swell.  MEDICATIONS:  Prenatal  vitamins.  HABITS:  She does not smoke or drink.  FAMILY HISTORY:  Father is 49 and has Hodgkins lymphoma.  Mother is 22 and is in good health.  She has two sisters and one brother.  The brother has asthma.  PHYSICAL EXAMINATION:  GENERAL:  The patient is in no distress.  She is obese.  VITAL SIGNS:  Her O2 saturation is 100% on room air.  Blood pressure 120/75, respirations 28, heart rate is 89.  SKIN:  Clear.  No cyanosis is noted.  No diaphoresis is noted.  NECK:  No JVD, no carotid bruits.  LUNGS:  Clear to auscultation and percussion.  CARDIAC:  A 1/6 systolic murmur.  No gallops are heard.  ABDOMEN:  Soft.  EXTREMITIES:  No edema.  LABORATORY DATA:  EKG is normal.  2D echocardiogram is normal.  CK-MB is normal, 96/1.2, troponin I is normal, less than 0.03.  Arterial blood gas revealed a PO2 of 115, PCO2 of 31, on room air.  Hemoglobin is 10.3. Potassium 3.7. DD:  11/09/99 TD:  11/10/99 Job: [id number]

## 2010-07-22 NOTE — Discharge Summary (Signed)
[facility]  Patient:    [name] [name], [name] [name]                    MRN: [id number] Adm. Date:  [id number] Disc. Date: 03/20/00 Attending:  Leonard [name] Dictator:   [name] [name], C.N.M.                           Discharge Summary  ADMISSION DIAGNOSES:          1. Intrauterine pregnancy at term.                               2. History of proteinuria.                               3. Favorable cervix.                               4. Early labor.                               5. Desires bilateral tubal ligation.                               6. History of syphilis with this pregnancy.  DISCHARGE DIAGNOSES:          1. Intrauterine pregnancy at term.                               2. History of proteinuria.                               3. Favorable cervix.                               4. Early labor.                               5. Desires bilateral tubal ligation.                               6. History of syphilis with this pregnancy.                               7. Breast feeding.                               8. Status post bilateral tubal ligation.  OPERATION/PROCEDURE:          1. Normal spontaneous vaginal delivery of a                                  viable female infant, named [name] [name], birth weight  7 pounds 12 ounces, assigned Apgar scores of                                  unknown first Apgar and 9 at five minutes,                                  attended by [name] [name], C.N.M. on March 18, 2000.                               2. On March 18, 2000 the patient underwent                                  bilateral tubal ligation for sterilization,                                  attended by [name] [name], M.D.; also                                  with no complications.  HOSPITAL COURSE:              [name] is a 35 year old single black female,  gravida 5 para 2, 0-2-2, at 40-1/7th weeks, who presented in early labor at term, with the cervix 3-4 cm dilated.  Due to her history she had artificial rupture of membranes for augmentation of labor, and progressed rapidly in labor to normal spontaneous vaginal delivery of a viable female infant, named [name] [name].  Birth weight was 7 pounds 12 ounces.  The birth was attended by [name] [name], C.N.M.  There was light meconium stained fluid noted and the infant was DeLee suctioned on the perineum and given to the awaiting pediatric team for assessment.  On the same day following delivery the patient desired sterilization and underwent the same by [name] [name], M.D. Please see the operative note for details.  Postpartum and postoperatively she has done well.  She is ambulating, voiding, and eating without difficulty. She is breast feeding well.  Her vital signs are stable and she is afebrile. She is deemed ready for discharge today.  DISCHARGE INSTRUCTIONS:       Her discharge instructions are per the [facility] hand-out.  DISCHARGE MEDICATIONS:        1. Motrin 600 mg p.o. q.6h p.r.n. pain.                               2. Tylox 1-2 p.o. q.4h to q.6h p.r.n. pain.                               3. Prenatal vitamin q.d.  DISCHARGE LABORATORY DATA:    Her hemoglobin is 11.2, WBC 9.4; platelets 166,000.  FOLLOW-UP:  She is to follow up at [facility] in six weeks, or p.r.n. DD:  03/20/00 TD:  03/20/00 Job: [id number]

## 2010-07-22 NOTE — Op Note (Signed)
[facility]  Patient:    [name] [name], [name] [name]                    MRN: [id number] Proc. Date: 03/18/00 Adm. Date:  [id number] Attending:  Leonard Schwartz                           Operative Report  PREOPERATIVE DIAGNOSIS:       1. Postpartum day 0.                               2. Desires sterilization.  POSTOPERATIVE DIAGNOSES:      1. Postpartum day 0.                               2. Desires sterilization.  PROCEDURE:                    Modified [place] postpartum bilateral tubal ligation.  SURGEON:                      Janine Limbo, M.D.  FIRST ASSISTANT:              None.  ANESTHESIA:                   General.  DISPOSITION:                   Ms. [name] is a 35 year old female, now para 3-0-2-3, who had a vaginal delivery of a healthy infant earlier today.  She desires permanent sterilization.  She understands the indications for the procedure, and she accepts of risks, but not limited to, anesthetic complications, bleeding, infection, possible damage to the surrounding organs, and possible tubal failure (10-17 per 1000).  FINDINGS:                     The fallopian tubes were normal bilaterally.  DESCRIPTION OF PROCEDURE:     The patient was taken to the operating room where a general anesthetic was given.  The patients abdomen was prepped with multiple layers of Betadine and then sterilely draped.  The subumbilical area was injected with 8 cc of 0.5% Marcaine.  A subumbilical incision was made and carried sharply through the subcutaneous tissue, the fascia, and the anterior peritoneum.  The left fallopian tube was identified and followed to its fimbriated end.  A knuckle of tube was made on the left using a free tie, and then a tied suture ligature of 0 plain catgut.  The knuckle tube that was made was sharply excised.  Hemostasis was adequate.  An identical procedure was carried out on the opposite side.  Again,  hemostasis was adequate.  All instruments were removed.  The fascia and the anterior peritoneum were closed using a running suture of 2-0 Vicryl.  The subcutaneous layer was closed using interrupted sutures of 2-0 chromic.  The skin was reapproximated using a subcuticular suture of 4-0 Vicryl.  Sponge, needle and instrument counts were correct on two occasions.  The estimated blood loss was 10 cc.  The patient tolerated her procedure well.  She was awakened from her anesthetic and taken to the recovery room in stable condition. DD:  03/18/00 TD:  03/18/00 Job: [id number]

## 2010-07-22 NOTE — Procedures (Signed)
NAMEBERLIN, [name] [name]             ACCOUNT NO.:  [id number]   MEDICAL RECORD NO.:  [id number]          PATIENT TYPE:  OUT   LOCATION:  SLEEP CENTER                 FACILITY:  [facility]   PHYSICIAN:  Clinton D. Maple Hudson, M.D. DATE OF BIRTH:  Mar 23, 1975   DATE OF STUDY:  01/04/2004                              NOCTURNAL POLYSOMNOGRAM   REFERRING PHYSICIAN:  Penni Bombard, MD   INDICATION FOR STUDY AND HISTORY:  Hypersomnia with sleep apnea.   Epworth sleepiness score 15/24, neck size 15.5 inches, BMI 42.8, weight 290  pounds.   SLEEP ARCHITECTURE:  Total sleep time 293 minutes with sleep efficiency of  80%.  Stage I was 7%; stage II, 68%; Stages III and IV 10%.  REM was 15% of  total sleep time.  Latency to sleep onset 11.5 minutes, latency to REM 156  minutes.  Awake after sleep onset 66 minutes.  Arousal index 79 which is  markedly increased and mostly nonspecific.   RESPIRATORY DATA:  RDI 4.9 per hour which is within the upper limits of  normal.  This included 5 obstructive apneas and 15 hypopneas.  Events were  not positional.  REM RDI 17.9 per hour.   OXYGEN DATA:  Moderate to loud snoring with oxygen desaturation briefly to a  nadir of 87%.  Mean oxygen saturation through the study was 96 to 97% on  room air.   CARDIAC DATA:  Normal sinus rhythm.   MOVEMENT AND PARASOMNIA:  A total of 78 limb jerks were recorded of which 24  were associated with arousal or awakening for a periodic limb movement with  arousal index of 4.9 per hour.   IMPRESSION AND RECOMMENDATIONS:  1.  Occasional obstructive apneas and hypopneas within the upper limits of      normal with an  RDI of 4.9 per hour, oxygen desaturation to 87%.  2.  Periodic limb movement with arousal syndrome, mild, 4.9 per hour.  3.  Frequent nonspecific EDG arousals which might respond to treatment for      nonspecific insomnia.   Clinton D. Maple Hudson, M.D.  Diplomate, Biomedical engineer of Sleep Medicine                                 Clinton D. Maple Hudson, M.D.  Diplomate, American Board  CDY/MEDQ  D:  01/10/2004 11:38:18  T:  01/11/2004 10:10:18  Job:  [id number]

## 2010-07-22 NOTE — Discharge Summary (Signed)
NAMEIDALIZ, [name] [name]             ACCOUNT NO.:  [id number]   MEDICAL RECORD NO.:  [id number]          PATIENT TYPE:  INP   LOCATION:  9320                          FACILITY:  [facility]   PHYSICIAN:  Janine Limbo, M.D.DATE OF BIRTH:  Jun 05, 1975   DATE OF ADMISSION:  03/12/2004  DATE OF DISCHARGE:  03/15/2004                                 DISCHARGE SUMMARY   DISCHARGE DIAGNOSES:  1.  Pelvic inflammatory disease.  2.  Positive gonococcal cervicitis.  3.  Hypokalemia.  4.  Pelvic pain.   PROCEDURE:  None.   HISTORY OF PRESENT ILLNESS:  [name] [name] is a 35 year old female, para 3-0-  2-3, who presented on March 12, 2004 complaining of a 3-7 day history of  pelvic pain. She had been seen in the emergency department on several  occasions and also in our doctor's office on several occasions. She was  treated as an outpatient with IV Rocephin and Zithromax. She was also given  doxicycline.  However, she presented with persistent nausea, vomiting and  constipation.  Inpatient therapy was felt to be required.  The patient is  status post bilateral tubal ligation.   PAST MEDICAL HISTORY:  The patient has a history of hypercholesterolemia,  hypertension and depression. The patient also has had a bilateral tubal  ligation.   ADMISSION PHYSICAL EXAMINATION:  VITAL SIGNS:  Temperature 101.7.  GENERAL:  Within normal limits except for an uncomfortable appearing female.  PELVIC:  Significant for a green cervical discharge. There was cervical  motion tenderness and generalized pelvic tenderness. The uterus was thought  to be normal except for tenderness. No adnexal masses were appreciated.   HOSPITAL COURSE:  The patient was admitted and started on IV Cefotan and  doxicycline. The patient quickly became afebrile.  Her laboratory values  were significant for a white blood cell count of 5100 initially.  The  patient's white count remained stable.  Her initial hemoglobin was 11.5, her  platelets was 239,000.  Her potassium was 3.3.  RPR was nonreactive, HIV was  nonreactive, HBsAg was negative.  The gonorrhea culture returned positive.  An ultrasound was obtained that showed an 8.9 cm x 6.6 cm x 4.3 cm uterus.  A subserosal fibroid was noted measuring 1.1 cm.  The endometrial stripe was  3 mm.  The ovaries appeared normal.  No pelvic masses were appreciated. The  patient continued to complain of pelvic pain for several days but slowly her  pain did improve. By March 15, 2004, the patient was tolerating a regular  diet. She had normal bowel function.  Her pain was much improved. The  decision was made to discharge the patient at that time and then continue  outpatient management.   DISCHARGE MEDICATIONS:  1.  Doxicycline 100 mg twice each day for 14 days.  2.  Dilaudid 2 mg 1 tablet every 3-4 hours as needed for pain #30 and no      refills.  3.  Iron 325 mg (over the counter) one tablet twice each day for six weeks.   DISCHARGE INSTRUCTIONS:  The patient will return to the  office in one week  for a followup examination. She will call for questions or concerns.  She  was instructed on safe sex behavior.     Arth   AVS/MEDQ  D:  03/15/2004  T:  03/15/2004  Job:  [id number]

## 2010-07-26 ENCOUNTER — Other Ambulatory Visit: Payer: Self-pay | Admitting: Family Medicine

## 2010-07-26 MED ORDER — LAMOTRIGINE 100 MG PO TABS: 100.0000 mg | ORAL_TABLET | Freq: Three times a day (TID) | ORAL | Status: DC

## 2010-07-28 ENCOUNTER — Telehealth: Payer: Self-pay | Admitting: Family Medicine

## 2010-07-28 NOTE — Telephone Encounter (Signed)
Pt checking status of referral to sleep disorder center, pt has been waiting a few months.

## 2010-07-29 NOTE — Telephone Encounter (Signed)
Spoke with patient and spoke about sleep referral and informed her that I am going to fax referral and office visit notes again to the sleep center.[name] [name]

## 2010-07-29 NOTE — Telephone Encounter (Signed)
LVM for patient to call back to discuss status of sleep study. In the appointments tab it shows that she had one scheduled for 3/20 and it was cancelled. Being that it was cancelled she will have to call and remake that appointment. Sleep disorders center # [phone].[name] [name]

## 2010-08-03 ENCOUNTER — Other Ambulatory Visit: Payer: Self-pay | Admitting: Family Medicine

## 2010-08-03 DIAGNOSIS — R5383 Other fatigue: Secondary | ICD-10-CM

## 2010-08-16 ENCOUNTER — Ambulatory Visit (HOSPITAL_BASED_OUTPATIENT_CLINIC_OR_DEPARTMENT_OTHER): Payer: Self-pay | Attending: Family Medicine

## 2010-08-16 DIAGNOSIS — G4710 Hypersomnia, unspecified: Secondary | ICD-10-CM | POA: Insufficient documentation

## 2010-08-16 DIAGNOSIS — R5383 Other fatigue: Secondary | ICD-10-CM

## 2010-08-18 ENCOUNTER — Encounter (HOSPITAL_BASED_OUTPATIENT_CLINIC_OR_DEPARTMENT_OTHER): Payer: Self-pay

## 2010-08-18 ENCOUNTER — Ambulatory Visit (INDEPENDENT_AMBULATORY_CARE_PROVIDER_SITE_OTHER): Payer: Self-pay | Admitting: Sports Medicine

## 2010-08-18 VITALS — BP 114/80 | HR 81 | Temp 98.6°F | Ht 69.0 in | Wt 330.0 lb

## 2010-08-18 DIAGNOSIS — G5712 Meralgia paresthetica, left lower limb: Secondary | ICD-10-CM | POA: Insufficient documentation

## 2010-08-18 DIAGNOSIS — G5710 Meralgia paresthetica, unspecified lower limb: Secondary | ICD-10-CM

## 2010-08-18 MED ORDER — MELOXICAM 15 MG PO TABS: 15.0000 mg | ORAL_TABLET | Freq: Every day | ORAL | Status: DC

## 2010-08-18 MED ORDER — PREDNISONE 50 MG PO TABS: 50.0000 mg | ORAL_TABLET | Freq: Every day | ORAL | Status: DC

## 2010-08-18 NOTE — Progress Notes (Signed)
  Subjective:    Patient ID: [name] [name], female    DOB: 1976/01/28, 35 y.o.   MRN: [id number]  HPI Pain in lateral left thigh for 2 weeks now.  Prickly in nature, goes from upper lat thigh to knee, occasionally below knee to lateral lower leg.  No trauma, sudden onset, No palliating factors, better with ibuprofen.  Worse with sitting.  No back pain, no numbness in feet.   Review of Systems See HPI    Objective:   Physical Exam  Constitutional: She appears well-developed and well-nourished.  Musculoskeletal:       Hip: ROM IR: 45 Deg, ER: 45 Deg, Flexion: 120 Deg, Extension: 100 Deg, Abduction: 45 Deg, Adduction: 45 Deg Strength IR: 5/5, ER: 5/5, Flexion: 5/5, Extension: 5/5, Abduction: 5/5, Adduction: 5/5 Pelvic alignment unremarkable to inspection and palpation. Standing hip rotation and gait without trendelenburg sign / unsteadiness. Greater trochanter without tenderness to palpation. No tenderness over piriformis and greater trochanter. No pain with FABER or FADIR. No SI joint tenderness and normal minimal SI movement.    Back Exam: Inspection: Unremarkable Motion: Flexion 45 deg, Extension 45 deg, Side Bending to 45 deg bilaterally,  Rotation to 45 deg bilaterally SLR laying:  Negative Palpable tenderness: None FABER: negative Sensory change: Gross sensation intact to all lumbar and sacral dermatomes. Reflexes: 2+ at both patellar tendons, 2+ at achilles tendons, Babinski's downgoing.  Strength at foot Plantar-flexion: 5/5    Dorsi-flexion: 5/5    Eversion: 5/5   Inversion: 5/5 Leg strength Quad: 5/5   Hamstring: 5/5   Hip flexor: 5/5   Hip abductors: 5/5        Assessment & Plan:

## 2010-08-18 NOTE — Patient Instructions (Addendum)
Great to meet you. See below for new medications. Read the attached material. Prednisone for 5 d. Meloxicam for pain. Come back if no better in 2 wks and we can attempt to inject around the nerve to free it.  Ihor Austin. Benjamin Stain, M.D. [facility] [address] [place], [place] [address] [phone]      Meralgia Paresthetica Disease Meralgia paresthetica (MP) is a disorder characterized by tingling, numbness, and burning pain in the outer side of the thigh. The disorder is caused by squeezing (compression) of a nerve in the thigh. It occurs in men more than women. MP is generally found in middle-aged or overweight people. People with the disorder often report that it appears or worsens after walking or standing. The skin may be sensitive to touch. MP may be associated with tight clothing, pregnancy, diabetes, and obesity. TREATMENT Treatment for MP is based on your symptoms and supportive care. Treatment usually involves wearing looser clothing, weight loss, and avoiding prolonged standing or walking. Your caregiver will prescribe a medication for you. In very few cases in which pain is persistent or severe, surgery may be needed. Sometimes, the disorder may disappear all of a sudden. WHAT WILL HAPPEN MP usually eases or disappears after treatment. Surgery is not always fully successful. Research is being done to learn more about this problem. Document Released: 02/10/2002 Document Re-Released: 05/19/2008 [facility] Patient Information 2011 [place], [place].

## 2010-08-18 NOTE — Assessment & Plan Note (Addendum)
Symptoms suggestive of left LFCN impingement. Clinical history is suggestive as is body habitus. Pt currently wearing very tight jeans though claims she doesn't wear tight clothes. Recommended wearing loose fitting clothing. Mobic 15mg  qd. RTC if no better in 2 weeks and can attempt injection around nerve.

## 2010-08-24 ENCOUNTER — Telehealth: Payer: Self-pay | Admitting: Family Medicine

## 2010-08-24 NOTE — Telephone Encounter (Signed)
Pt asking for sleep study results from last week

## 2010-08-25 NOTE — Telephone Encounter (Signed)
Pt is calling to see about sleep study - has an appt tomorrow and needs to have the results for that visit.

## 2010-08-26 ENCOUNTER — Ambulatory Visit (INDEPENDENT_AMBULATORY_CARE_PROVIDER_SITE_OTHER): Payer: Self-pay | Admitting: Family Medicine

## 2010-08-26 ENCOUNTER — Encounter: Payer: Self-pay | Admitting: Family Medicine

## 2010-08-26 ENCOUNTER — Telehealth: Payer: Self-pay | Admitting: Family Medicine

## 2010-08-26 DIAGNOSIS — R5383 Other fatigue: Secondary | ICD-10-CM

## 2010-08-26 DIAGNOSIS — R5381 Other malaise: Secondary | ICD-10-CM

## 2010-08-26 DIAGNOSIS — J3089 Other allergic rhinitis: Secondary | ICD-10-CM

## 2010-08-26 DIAGNOSIS — J3081 Allergic rhinitis due to animal (cat) (dog) hair and dander: Secondary | ICD-10-CM | POA: Insufficient documentation

## 2010-08-26 NOTE — Patient Instructions (Signed)
My office will call you with final results of the sleep test.

## 2010-08-26 NOTE — Assessment & Plan Note (Signed)
Pt advised to try taking zyrtec at night if they bother her. She may try it but will get it OTC if she wants to try it.

## 2010-08-26 NOTE — Telephone Encounter (Signed)
Excellent

## 2010-08-26 NOTE — Telephone Encounter (Signed)
Pt calling to let MD know she will be seeing Dr. Ledon Snare on 7/3 to restart medication.

## 2010-08-26 NOTE — Assessment & Plan Note (Signed)
Pt had sleep latency study done on 08-16-10 but we don't have results back yet. THey have been requested and we got prelim results but not the final assessment. Will instruction pt when we have them.

## 2010-08-26 NOTE — Progress Notes (Signed)
Allergies: Pt is having some allergy symptoms. Drainage for the last 2 weeks but worsening yesterday. She is not having sneezing or watery itchy eyes. She does have a pet that comes and goes inside and outside. She has tried allergy medicine in the past and it makes her sleepy.   Sleep study: Pt wants to get her sleep study results today but we were only able to get the preliminary report which has no assessment done yet.  She has not been taking the Lamictal and Effexor since she did the sleep study on 08-16-10. She has called her psychologist about restarting the meds and wanted to know if she should based on her sleep studies. I told her I think she should restart them but would like Dr. Ledon Snare to weigh in as well.   ROS: allergy symptoms, occasional skin/neck rash, sleepiness, weight gain, fatigue, can't focus well.   PE:  Gen: NAD, seated comfrotably.  Psych:Pt appears drowsy and has some difficutly focusing on her words.

## 2010-08-26 NOTE — Telephone Encounter (Signed)
I have not seen her sleep study (I believe this was to evaluate for narcolepsy) results yet. They have probably not been sent to me yet unless they came late the afternoon of 6/20. If she needs a copy of them immediately she needs to call the sleep center and have the record requested to be sent to her doctor's appointment. Please let her know.

## 2010-08-28 DIAGNOSIS — G4710 Hypersomnia, unspecified: Secondary | ICD-10-CM

## 2010-08-29 ENCOUNTER — Ambulatory Visit: Payer: Self-pay | Admitting: Family Medicine

## 2010-08-29 NOTE — Procedures (Signed)
NAMESHARHONDA, ATWOOD             ACCOUNT NO.:  [id number]  MEDICAL RECORD NO.:  [id number]          PATIENT TYPE:  OUT  LOCATION:  SLEEP CENTER                 [facility]ACILITY[facility]  [facility]  PHYSICIAN:  Abass Misener D. Maple Hudson, MD, FCCP, FACPDATE OF BIRTH:  Mar 08, 1975  DATE OF STUDY:  08/16/2010                         MULTIPLE SLEEP LATENCY TEST  REFERRING PHYSICIAN:  Jamie Brookes, MD  INDICATION FOR STUDY:  Hypersomnia with concern for primary excessive somnolence such as narcolepsy.  This is a multiple sleep latency test.  EPWORTH SLEEPINESS SCORE:  20/24.  BMI: 1. Weight 325 pounds, height 5 feet 9 inches.  Neck 14.5 inches.  MEDICATIONS:  Home medicines are charted and reviewed.  A baseline diagnostic NPSG on May 11, 2010, was significant for fragmented sleep with total sleep time of 300 minutes and sleep efficiency of 79.9%.  AHI 6.6 per hour indicating mild obstructive sleep apnea/hypopnea syndrome.  The events all appeared after 2 a.m.  NAP 1:  8:40 a.m., sleep latency 10.5 minutes, REM latency NA.  NAP 2:  10:40 a.m., sleep latency 3.5 minutes, REM latency NA.  NAP 3:  12:40 p.m., sleep latency 13 minutes, REM latency NA.  NAP 4:  14:40 p.m., sleep latency 1 minute, REM latency NA.  NAP 5:  16:40 p.m., sleep latency 20 minutes, REM latency NA.   MEAN SLEEP LATENCY:  NUMBER OF REM EPISODES:  COMMENTS:  IMPRESSIONS-RECOMMENDATIONS: 1. No medications were taken on the day of the study per protocol.     NPSG on May 11, 2010, had recorded 300 minutes of sleep with sleep     efficiency 79.9%.  REM was 14% of total sleep time.  AHI 6.6 per     hour. 2. Mean sleep latency 9.6 minutes.  This indicates daytime sleepiness     mildly in excess of usual norms, but not clearly pathologic.     Definitely pathologic daytime somnolence as is seen with     narcolepsy, is usually associated with a mean sleep latency of 5     minutes or less.  Sleepiness in the present range may reflect  mild     organic sleepiness, chronic sleep deprivation, with sedating effect     of medication.  First concern is to establish good sleep habits.     If there is adequate nighttime sleep, then cautious use of a     daytime stimulant may be appropriate.     Aamori Mcmasters D. Maple Hudson, MD, [facility], FACP Diplomate, Biomedical engineer of Sleep Medicine Electronically Signed    CDY/MEDQ  D:  08/28/2010 20:13:06  T:  08/29/2010 04:21:54  Job:  [id number]

## 2010-09-14 ENCOUNTER — Encounter: Payer: Self-pay | Admitting: Family Medicine

## 2010-09-14 NOTE — Progress Notes (Unsigned)
Sleep study performed on 08/16/10 reviewed and found to be normal. Called pt to inform of results. Per study's impression will discuss good sleep habits, and possibility of daytime stimulant for symptoms at next appointment.

## 2010-09-20 NOTE — Telephone Encounter (Signed)
Pt seen on 6/22. Closing encounter.

## 2010-10-18 ENCOUNTER — Other Ambulatory Visit: Payer: Self-pay | Admitting: Family Medicine

## 2010-10-18 MED ORDER — ESTROGENS CONJUGATED 1.25 MG PO TABS: 1.2500 mg | ORAL_TABLET | Freq: Every day | ORAL | Status: DC

## 2010-10-26 ENCOUNTER — Other Ambulatory Visit: Payer: Self-pay | Admitting: Family Medicine

## 2010-10-26 DIAGNOSIS — F319 Bipolar disorder, unspecified: Secondary | ICD-10-CM

## 2010-10-26 MED ORDER — VENLAFAXINE HCL ER 75 MG PO TB24: 75.0000 mg | ORAL_TABLET | Freq: Every day | ORAL | Status: DC

## 2010-10-28 ENCOUNTER — Ambulatory Visit: Payer: Self-pay | Admitting: Family Medicine

## 2010-11-02 ENCOUNTER — Other Ambulatory Visit: Payer: Self-pay | Admitting: Family Medicine

## 2010-11-02 DIAGNOSIS — F319 Bipolar disorder, unspecified: Secondary | ICD-10-CM

## 2010-11-02 MED ORDER — VENLAFAXINE HCL ER 75 MG PO TB24: 75.0000 mg | ORAL_TABLET | Freq: Every day | ORAL | Status: DC

## 2010-11-03 ENCOUNTER — Other Ambulatory Visit: Payer: Self-pay | Admitting: Family Medicine

## 2010-11-03 DIAGNOSIS — F319 Bipolar disorder, unspecified: Secondary | ICD-10-CM

## 2010-11-03 MED ORDER — VENLAFAXINE HCL ER 75 MG PO TB24: 75.0000 mg | ORAL_TABLET | Freq: Every day | ORAL | Status: DC

## 2010-11-16 ENCOUNTER — Ambulatory Visit: Payer: Self-pay | Admitting: Family Medicine

## 2010-11-21 ENCOUNTER — Encounter: Payer: Self-pay | Admitting: Family Medicine

## 2010-11-21 ENCOUNTER — Ambulatory Visit (INDEPENDENT_AMBULATORY_CARE_PROVIDER_SITE_OTHER): Payer: Self-pay | Admitting: Family Medicine

## 2010-11-21 DIAGNOSIS — R5381 Other malaise: Secondary | ICD-10-CM

## 2010-11-21 DIAGNOSIS — R5383 Other fatigue: Secondary | ICD-10-CM

## 2010-11-23 NOTE — Assessment & Plan Note (Signed)
[name] [name] has discussed this with her therapist.   I think she may benefit from some stimulant medications.  Barriers will be her hypertension and affordability of medications.  Her therapist [name] [name]  will be calling me in the next few days of recommendation.  If she doesn't have medical contraindications to these medications I will start [name] [name] on them, and have her followup with me in one month.

## 2010-11-23 NOTE — Progress Notes (Signed)
Ms. Cyphers presents to clinic today to followup her fatigue. In the interim she's had a sleep latency study.   This study showed that it did occur on average 9.6 minutes to fall asleep.  This was not consistent with narcolepsy. She continues to experience significant daytime sleepiness. At this point recommendations from consultants physician states that she may benefit from stimulant medication.   PMH reviewed.  ROS as above otherwise neg Medications reviewed.  Exam:  BP 139/91  Pulse 81  Temp(Src) 98.3 F (36.8 C) (Oral)  Ht 5\' 9"  (1.753 m)  Wt 332 lb (150.594 kg)  BMI 49.03 kg/m2 Gen: Well NAD, obese Lungs: CTABL Nl WOB Heart: RRR no MRG

## 2010-11-30 ENCOUNTER — Telehealth: Payer: Self-pay | Admitting: *Deleted

## 2010-11-30 NOTE — Telephone Encounter (Signed)
Phone call received from Dr. Ledon Snare .  States Dr. Denyse Amass wanted to know what he would recommend for patient's daytime sleepiness.  He recommends Provigil  ( generic ). Start with 100 mg daily in the morning and increase to 200 mg  daily in the morning. BP needs to be monitored regularly  on this med. Will forward message to MD. If needed may call Dr. Ledon Snare back at [phone]

## 2010-12-01 NOTE — Telephone Encounter (Signed)
Called the patient and the pharmacy. Modofinil is $600 a month.  Pt will not pick it up.

## 2010-12-02 ENCOUNTER — Other Ambulatory Visit: Payer: Self-pay | Admitting: Family Medicine

## 2010-12-02 MED ORDER — LAMOTRIGINE 100 MG PO TABS: 100.0000 mg | ORAL_TABLET | Freq: Three times a day (TID) | ORAL | Status: DC

## 2010-12-12 ENCOUNTER — Telehealth: Payer: Self-pay | Admitting: Family Medicine

## 2010-12-12 NOTE — Telephone Encounter (Signed)
Ms. Fauble called to ask if you have heard from Dr. Ledon Snare regarding his suggestion to prescribe Ritalin.  [facility]  Please notifiy patient when rx is written.  Want it called to [facility] on [address] and if not will pick up at office.

## 2010-12-13 LAB — POCT URINALYSIS DIP (DEVICE)
Glucose, UA: NEGATIVE
Operator id: 194561
Specific Gravity, Urine: 1.02
Urobilinogen, UA: 0.2

## 2010-12-15 LAB — URINALYSIS, ROUTINE W REFLEX MICROSCOPIC
Protein, ur: 100 — AB
Urobilinogen, UA: 0.2

## 2010-12-15 LAB — COMPREHENSIVE METABOLIC PANEL
ALT: 17
AST: 20
Alkaline Phosphatase: 52
CO2: 26
Chloride: 104
GFR calc Af Amer: >60
GFR calc non Af Amer: >60
Potassium: 3.9
Sodium: 140
Total Bilirubin: 0.5

## 2010-12-15 LAB — URINE MICROSCOPIC-ADD ON

## 2010-12-15 LAB — BASIC METABOLIC PANEL
CO2: 27
Calcium: 9.9
GFR calc Af Amer: >60
GFR calc non Af Amer: >60
Sodium: 141

## 2010-12-15 LAB — CBC
Hemoglobin: 12.3
MCHC: 34.1
RBC: 4.18

## 2010-12-16 ENCOUNTER — Ambulatory Visit (INDEPENDENT_AMBULATORY_CARE_PROVIDER_SITE_OTHER): Payer: Self-pay | Admitting: Family Medicine

## 2010-12-16 ENCOUNTER — Encounter: Payer: Self-pay | Admitting: Family Medicine

## 2010-12-16 VITALS — BP 132/84 | HR 90 | Wt 331.6 lb

## 2010-12-16 DIAGNOSIS — R5381 Other malaise: Secondary | ICD-10-CM

## 2010-12-16 DIAGNOSIS — R5383 Other fatigue: Secondary | ICD-10-CM

## 2010-12-16 LAB — BASIC METABOLIC PANEL
BUN: 7
BUN: 7
Calcium: 8.8
Chloride: 103
Creatinine, Ser: 1.02
GFR calc Af Amer: >60
GFR calc non Af Amer: >60
GFR calc non Af Amer: >60
Glucose, Bld: 160 — ABNORMAL HIGH
Potassium: 3.6
Potassium: 3.7
Sodium: 139

## 2010-12-16 LAB — CBC
HCT: 30.6 — ABNORMAL LOW
HCT: 32.2 — ABNORMAL LOW
MCV: 85.7
Platelets: 265
Platelets: 281
Platelets: 287
RBC: 3.57 — ABNORMAL LOW
RBC: 4.33
RDW: 13.2
WBC: 12.1 — ABNORMAL HIGH
WBC: 8.8
WBC: 7.2

## 2010-12-16 LAB — COMPREHENSIVE METABOLIC PANEL
ALT: 14
AST: 15
Albumin: 3.8
Alkaline Phosphatase: 52
CO2: 28
Chloride: 101
GFR calc Af Amer: >60
GFR calc non Af Amer: >60
Potassium: 3.2 — ABNORMAL LOW
Total Bilirubin: 0.5

## 2010-12-16 LAB — URINALYSIS, ROUTINE W REFLEX MICROSCOPIC
Bilirubin Urine: NEGATIVE
Glucose, UA: NEGATIVE
Nitrite: NEGATIVE
Specific Gravity, Urine: 1.02
pH: 6

## 2010-12-16 LAB — POCT PREGNANCY, URINE: Operator id: 22333

## 2010-12-16 MED ORDER — METHYLPHENIDATE HCL ER (LA) 10 MG PO CP24: 10.0000 mg | ORAL_CAPSULE | ORAL | Status: DC

## 2010-12-16 NOTE — Patient Instructions (Signed)
Thank you for coming in today. Take the medication in the morning before you leave for work. It should last for about 8-10 hours.  Come see me before you run out for a Blood pressure recheck and re-evaluation. (1st week of November). Call me if you are having side effects.

## 2010-12-16 NOTE — Assessment & Plan Note (Signed)
We had a lengthy discussion of the hassle of using this controlled substance, and the risks and benefits of it.  Specific risk outlined were worsening hypertension Agitation, tics, and decreased appetite.  After discussion [name] [name] agreed to start Ritalin Plan to followup in one month for blood pressure recheck and symptom evaluation

## 2010-12-16 NOTE — Telephone Encounter (Signed)
  Dr. Ledon Snare psychology call back recommending extended release Ritalin in the morning. Ritalin ER qd 10mg  in the morning.  Patient will make an appointment for today and will start medication after discussion.

## 2010-12-16 NOTE — Progress Notes (Addendum)
Ms. Cobin presents to clinic today to followup her fatigue and to initiate Ritalin. In the interim she has discussed with her psychologist who initially recommended Provigil. However this medication cost $800 a month which she is unable to afford. After further discussion with her psychologist he recommends Ritalin extended release tablet in the morning.  We had a lengthy discussion of the hassle of using this controlled substance, and the risks and benefits of it.  Specific risk outlined were worsening hypertension Agitation, tics, and decreased appetite.  PMH reviewed.  ROS as above otherwise neg Medications reviewed.  Exam:  BP 132/84  Pulse 90  Wt 331 lb 9.6 oz (150.413 kg) Gen: Well NAD Psych: Alert and oriented x3, mood intact and normal. Good eye contact. Thought process is linear and goal-directed. No psychomotor retardation or agitation.    (Edited on 01/12/11 Reglan corrected to Ritalin)

## 2010-12-19 LAB — URINALYSIS, ROUTINE W REFLEX MICROSCOPIC
Bilirubin Urine: NEGATIVE
Glucose, UA: NEGATIVE
Ketones, ur: NEGATIVE
Protein, ur: NEGATIVE

## 2010-12-19 LAB — CBC
MCHC: 33.6
RBC: 3.97
WBC: 7.4

## 2010-12-19 LAB — DIFFERENTIAL
Basophils Relative: 0
Lymphocytes Relative: 40
Monocytes Relative: 7
Neutro Abs: 3.8
Neutrophils Relative %: 52

## 2010-12-19 LAB — WET PREP, GENITAL
Clue Cells Wet Prep HPF POC: NONE SEEN
Trich, Wet Prep: NONE SEEN

## 2010-12-19 LAB — GC/CHLAMYDIA PROBE AMP, GENITAL: Chlamydia, DNA Probe: NEGATIVE

## 2010-12-19 LAB — POCT PREGNANCY, URINE
Operator id: 120561
Preg Test, Ur: NEGATIVE

## 2010-12-19 LAB — URINE MICROSCOPIC-ADD ON

## 2010-12-26 ENCOUNTER — Telehealth: Payer: Self-pay | Admitting: Family Medicine

## 2010-12-26 NOTE — Telephone Encounter (Signed)
Called pt and advised to f/up with Dr.Corey in 3 weeks. Also needs to f/up HTN and told pt to give it more time. Pt agreed.  Fwd. To PCP for review .Arlyss Repress

## 2010-12-26 NOTE — Telephone Encounter (Signed)
Called to say she has been on the ritalin for 7 days now and has seen no improvement.  Wants to know what she should do.

## 2011-01-12 ENCOUNTER — Encounter: Payer: Self-pay | Admitting: Family Medicine

## 2011-01-12 ENCOUNTER — Ambulatory Visit (INDEPENDENT_AMBULATORY_CARE_PROVIDER_SITE_OTHER): Payer: Self-pay | Admitting: Family Medicine

## 2011-01-12 VITALS — BP 138/78 | HR 86 | Temp 98.5°F | Ht 69.0 in | Wt 325.0 lb

## 2011-01-12 DIAGNOSIS — R5381 Other malaise: Secondary | ICD-10-CM

## 2011-01-12 DIAGNOSIS — R5383 Other fatigue: Secondary | ICD-10-CM

## 2011-01-12 MED ORDER — METHYLPHENIDATE HCL ER (LA) 20 MG PO CP24: 20.0000 mg | ORAL_CAPSULE | ORAL | Status: DC

## 2011-01-12 NOTE — Patient Instructions (Signed)
Thank you for coming in today. I am increasing your ritalin dose to 20 mg a day.  Take that pill in the morning and come back before 1 month is up.  I will try to get you into a neurology office for a second or third opinion about this fatigue. We will recheck your BP in 1 month.

## 2011-01-12 NOTE — Progress Notes (Signed)
[name] [name] presents to clinic today to followup her fatigue.  In the interim she was started on Ritalin 10 mg long-acting daily. She notes that this didn't seem to help very much and she continues to experience significant fatigue.  For workup for her fatigue she's had sleep study, sleep latency study and thorough evaluation by her psychologist.  Initially she was recommended for Provigil, however that was too expensive at $800 a month.  She feels well otherwise and denies any fevers chills chest pains palpitations dyspnea or syncope.  She experiences her fatigue is feeling sleepy despite adequate sleep at night.   PMH reviewed.  ROS as above otherwise neg Medications reviewed.  Exam:  BP 138/78  Pulse 86  Temp(Src) 98.5 F (36.9 C) (Oral)  Ht 5\' 9"  (1.753 m)  Wt 325 lb (147.419 kg)  BMI 47.99 kg/m2 Gen: Well NAD Obese woman Psych: Alert and oriented x3 normal mood and affect. Thought process and speech is linear and goal-directed. No suicidality or homicidality.

## 2011-01-12 NOTE — Assessment & Plan Note (Signed)
Ritalin 10 mg a day didn't seem to be very effective. Plan to increase to 20 mg a day.  Also will refer to neurology for second opinion. She does have insurance therefore she may need to get to [facility]. Followup in one month.

## 2011-02-22 ENCOUNTER — Other Ambulatory Visit: Payer: Self-pay | Admitting: Family Medicine

## 2011-02-22 MED ORDER — LAMOTRIGINE 100 MG PO TABS: 100.0000 mg | ORAL_TABLET | Freq: Two times a day (BID) | ORAL | Status: DC

## 2011-03-04 ENCOUNTER — Encounter (HOSPITAL_COMMUNITY): Payer: Self-pay | Admitting: Emergency Medicine

## 2011-03-04 ENCOUNTER — Emergency Department (INDEPENDENT_AMBULATORY_CARE_PROVIDER_SITE_OTHER)
Admission: EM | Admit: 2011-03-04 | Discharge: 2011-03-04 | Disposition: A | Discharge status: Home / Self Care | Payer: Self-pay | Source: Home / Self Care | Attending: Family Medicine | Admitting: Family Medicine

## 2011-03-04 DIAGNOSIS — S058X9A Other injuries of unspecified eye and orbit, initial encounter: Secondary | ICD-10-CM

## 2011-03-04 DIAGNOSIS — S0502XA Injury of conjunctiva and corneal abrasion without foreign body, left eye, initial encounter: Secondary | ICD-10-CM

## 2011-03-04 HISTORY — DX: Anemia, unspecified: D64.9

## 2011-03-04 MED ORDER — POLYMYXIN B-TRIMETHOPRIM 10000-0.1 UNIT/ML-% OP SOLN: 1.0000 [drp] | OPHTHALMIC | Status: AC

## 2011-03-04 MED ORDER — KETOROLAC TROMETHAMINE 0.5 % OP SOLN: 1.0000 [drp] | Freq: Four times a day (QID) | OPHTHALMIC | Status: AC

## 2011-03-04 MED ORDER — TETRACAINE HCL 0.5 % OP SOLN
OPHTHALMIC | Status: AC
  Filled 2011-03-04: qty 2

## 2011-03-04 NOTE — ED Provider Notes (Signed)
History     CSN: [id number]  Arrival date & time 03/04/11  9147   First MD Initiated Contact with Patient 03/04/11 1110      Chief Complaint  Patient presents with  . Eye Pain    (Consider location/radiation/quality/duration/timing/severity/associated sxs/prior treatment) HPI Comments: [name] [name] presents for evaluation of pain, redness, tearing, and photophobia in her LEFT eye. She reports onset of symptoms yesterday after opening the blinds. She reports a foreign body sensation.   Patient is a 35 y.o. female presenting with eye pain. The history is provided by the patient.  Eye Pain This is a new problem. The current episode started yesterday. The problem occurs constantly. The problem has not changed since onset.Exacerbated by: light and opening eye. The symptoms are relieved by nothing.    Past Medical History  Diagnosis Date  . Allergy   . Bipolar 1 disorder     Sees Dr. Ledon Snare, psychology,  [phone]  . Fatigue   . Anemia     Past Surgical History  Procedure Date  . Abdominal hysterectomy     No family history on file.  History  Substance Use Topics  . Smoking status: Never Smoker   . Smokeless tobacco: Not on file  . Alcohol Use: No    OB History    Grav Para Term Preterm Abortions TAB SAB Ect Mult Living                  Review of Systems  Constitutional: Negative.   HENT: Negative.   Eyes: Positive for photophobia, pain and redness. Negative for discharge.  Respiratory: Negative.   Gastrointestinal: Negative.   Genitourinary: Negative.   Musculoskeletal: Negative.   Neurological: Negative.   Psychiatric/Behavioral: Negative.     Allergies  Hydromorphone hcl; Lisinopril; Meperidine hcl; Morphine; Sumatriptan; and Zolmitriptan  Home Medications   Current Outpatient Rx  Name Route Sig Dispense Refill  . IBUPROFEN 600 MG PO TABS Oral Take 600 mg by mouth 3 (three) times daily with meals.      Marland Kitchen CALCIUM CARBONATE-VITAMIN D 600-400 MG-UNIT  PO CHEW Oral Chew 1 tablet by mouth 2 (two) times daily.      Marland Kitchen ESTROGENS CONJUGATED 1.25 MG PO TABS Oral Take 1 tablet (1.25 mg total) by mouth daily. 30 tablet 1  . FERROUS SULFATE 325 (65 FE) MG PO TABS Oral Take 325 mg by mouth 2 (two) times daily.      Marland Kitchen KETOROLAC TROMETHAMINE 0.5 % OP SOLN Left Eye Place 1 drop into the left eye every 6 (six) hours. 5 mL 0  . LAMOTRIGINE 100 MG PO TABS Oral Take 1 tablet (100 mg total) by mouth 2 (two) times daily. 60 tablet 6  . METHYLPHENIDATE HCL ER (LA) 20 MG PO CP24 Oral Take 1 capsule (20 mg total) by mouth every morning. 30 capsule 0  . POLYMYXIN B-TRIMETHOPRIM 10000-0.1 UNIT/ML-% OP SOLN Left Eye Place 1 drop into the left eye every 4 (four) hours. 10 mL 0  . VENLAFAXINE HCL 75 MG PO TB24 Oral Take 1 tablet (75 mg total) by mouth daily. 30 each 6    BP 133/81  Pulse 88  Temp(Src) 98.3 F (36.8 C) (Oral)  Resp 16  SpO2 100%  Physical Exam  Nursing note and vitals reviewed. Constitutional: She is oriented to person, place, and time. She appears well-developed and well-nourished.  HENT:  Head: Normocephalic and atraumatic.  Eyes: EOM are normal. Pupils are equal, round, and reactive to light. Right eye exhibits  no discharge and no exudate. Left eye exhibits no discharge and no exudate. Right conjunctiva is not injected. Left conjunctiva is injected.  Slit lamp exam:      The right eye shows no corneal abrasion, no corneal ulcer and no foreign body.       The left eye shows corneal abrasion and fluorescein uptake. The left eye shows no corneal ulcer and no foreign body.    Neck: Normal range of motion.  Pulmonary/Chest: Effort normal.  Musculoskeletal: Normal range of motion.  Neurological: She is alert and oriented to person, place, and time.  Skin: Skin is warm and dry.  Psychiatric: Her behavior is normal.    ED Course  Procedures (including critical care time)  Labs Reviewed - No data to display No results found.   1. Corneal  abrasion, left       MDM  rx given for Polytrim and Acular; advised regular flushing        Richardo Priest, MD 03/04/11 1413

## 2011-03-04 NOTE — ED Notes (Signed)
Tetracaine/eyewash at bedside

## 2011-03-04 NOTE — ED Notes (Signed)
Opened blinds to window, reports pain with sunshine shined in eye, but denies feeling anything in eye at that time.  Now feels like something in left eye.  Vision is blurry, eye red, watery, and painful.  Patient reports she was wearing contacts yesterday, did remove them past night and they remain out today.

## 2011-03-14 ENCOUNTER — Other Ambulatory Visit: Payer: Self-pay | Admitting: Family Medicine

## 2011-03-14 MED ORDER — ESTROGENS CONJUGATED 1.25 MG PO TABS: 1.2500 mg | ORAL_TABLET | Freq: Every day | ORAL | Status: DC

## 2011-06-09 ENCOUNTER — Other Ambulatory Visit: Payer: Self-pay | Admitting: Family Medicine

## 2011-06-09 DIAGNOSIS — F319 Bipolar disorder, unspecified: Secondary | ICD-10-CM

## 2011-06-09 MED ORDER — VENLAFAXINE HCL ER 75 MG PO TB24: 75.0000 mg | ORAL_TABLET | Freq: Every day | ORAL | Status: DC

## 2011-06-21 ENCOUNTER — Other Ambulatory Visit: Payer: Self-pay | Admitting: Family Medicine

## 2011-06-21 MED ORDER — ESTROGENS CONJUGATED 1.25 MG PO TABS: 1.2500 mg | ORAL_TABLET | Freq: Every day | ORAL | Status: DC

## 2011-06-29 ENCOUNTER — Ambulatory Visit (INDEPENDENT_AMBULATORY_CARE_PROVIDER_SITE_OTHER): Payer: Self-pay | Admitting: Sports Medicine

## 2011-06-29 ENCOUNTER — Encounter: Payer: Self-pay | Admitting: Sports Medicine

## 2011-06-29 VITALS — BP 144/95 | HR 98 | Temp 98.2°F | Ht 69.0 in | Wt 338.1 lb

## 2011-06-29 DIAGNOSIS — G5712 Meralgia paresthetica, left lower limb: Secondary | ICD-10-CM

## 2011-06-29 DIAGNOSIS — M79609 Pain in unspecified limb: Secondary | ICD-10-CM

## 2011-06-29 DIAGNOSIS — M79604 Pain in right leg: Secondary | ICD-10-CM

## 2011-06-29 DIAGNOSIS — IMO0002 Reserved for concepts with insufficient information to code with codable children: Secondary | ICD-10-CM

## 2011-06-29 DIAGNOSIS — E669 Obesity, unspecified: Secondary | ICD-10-CM

## 2011-06-29 DIAGNOSIS — G5710 Meralgia paresthetica, unspecified lower limb: Secondary | ICD-10-CM

## 2011-06-29 DIAGNOSIS — M7050 Other bursitis of knee, unspecified knee: Secondary | ICD-10-CM

## 2011-06-29 MED ORDER — CYCLOBENZAPRINE HCL 10 MG PO TABS: 10.0000 mg | ORAL_TABLET | Freq: Three times a day (TID) | ORAL | Status: AC | PRN

## 2011-06-29 MED ORDER — MELOXICAM 15 MG PO TABS: 15.0000 mg | ORAL_TABLET | Freq: Every day | ORAL | Status: DC

## 2011-06-29 NOTE — Patient Instructions (Signed)
You have a muscle spasm in your leg.  I have sent in a prescription for flexeril a muscle relaxer and Mobic (Meloxicam) an anti-inflammatory.  Take the Mobic daily and the flexeril at night as needed.  If you are not better in 1 week please come back to see me.   Please schedule an appointment with Dr. Denyse Amass or myself to discuss your weight gain and Sleep apnea and to discuss strategies to help you start loosing the weight you have gained.

## 2011-06-30 NOTE — Progress Notes (Signed)
HPI:  [name] [name] is a 36 y.o. female presenting today for evaluation of R leg pain. Location  B hip and upper legs however worse over anterior to medial R leg  Onset  gradual with acute worsening over past 4-5 days  Character  ache  Severity  5/10   Temporal  worse after a busy day  Alleviating  Rest  Aggrivating  activity; standing on her feet     ROS No fevers, no chills.  No erythema  HISTORY Medications Reviewed & Updated, see associated section Medical Hx Reviewed: Yes - remarkable for obesity, bipolar Family History Reviewed: Yes  Social History Reviewed:  Yes   PE: GENERAL:  Adult AA obese female.  Examined in [facility].  In mild discomfort; norespiratory distress.   PSYCH: Alert and appropriately interactive; Insight:Good H&N: AT/[place], MMM, no scleral icterus, EOMi EXTREMITIES: Moves all 4 extremities spontaneously, warm well perfused, no edema, bilateral DP and PT pulses 2/4.   >MSK/Osteopathic:  PELVIS  R anterior innominant  LE   TTP over R sartorius; worse at pes anserine insertion.  R hip externally rotated.  B hip flexion limited to 35o.   Negative straight leg raise B  Negative FABER  Negative FADIR  Negative log roll  >NEURO: LE myotomes grossly intact without focal deficit.

## 2011-07-03 DIAGNOSIS — M79604 Pain in right leg: Secondary | ICD-10-CM | POA: Insufficient documentation

## 2011-07-04 DIAGNOSIS — M7050 Other bursitis of knee, unspecified knee: Secondary | ICD-10-CM | POA: Insufficient documentation

## 2011-07-04 NOTE — Assessment & Plan Note (Signed)
Likely not recurrent at this time due to presentation of sypmtoms.  Consider if no improvement with conservative treatment

## 2011-07-04 NOTE — Assessment & Plan Note (Signed)
Likely large contributing factor; pt with relative deconditioning given weight gain; pt interested in "getting serious about weight loss"  Instructed to follow up with Dr. Denyse Amass or myself to address this further

## 2011-07-04 NOTE — Assessment & Plan Note (Signed)
Pt with non-specific "leg pain" but stating her R leg is worse than L.  On exam TTP and spasm of Sartorius as well as tenderness on Pes Anserine bursa. Will treat symptomatically now for pes anserine bursitis per AVS.  Consider recurrent Meralgia paresthetica but pt not reporting nerve type pain.

## 2011-08-11 ENCOUNTER — Other Ambulatory Visit: Payer: Self-pay | Admitting: Family Medicine

## 2011-08-11 MED ORDER — LAMOTRIGINE 100 MG PO TABS: 100.0000 mg | ORAL_TABLET | Freq: Two times a day (BID) | ORAL | Status: DC

## 2011-08-27 IMAGING — CR DG FOOT COMPLETE 3+V*R*
3 series · 3 of 3 positions shown · non-contrast
Comparison: None

CLINICAL DATA: Right fifth toe pain post injury 2 weeks ago

RIGHT FOOT COMPLETE - 3+ VIEW

[t foot ap right]
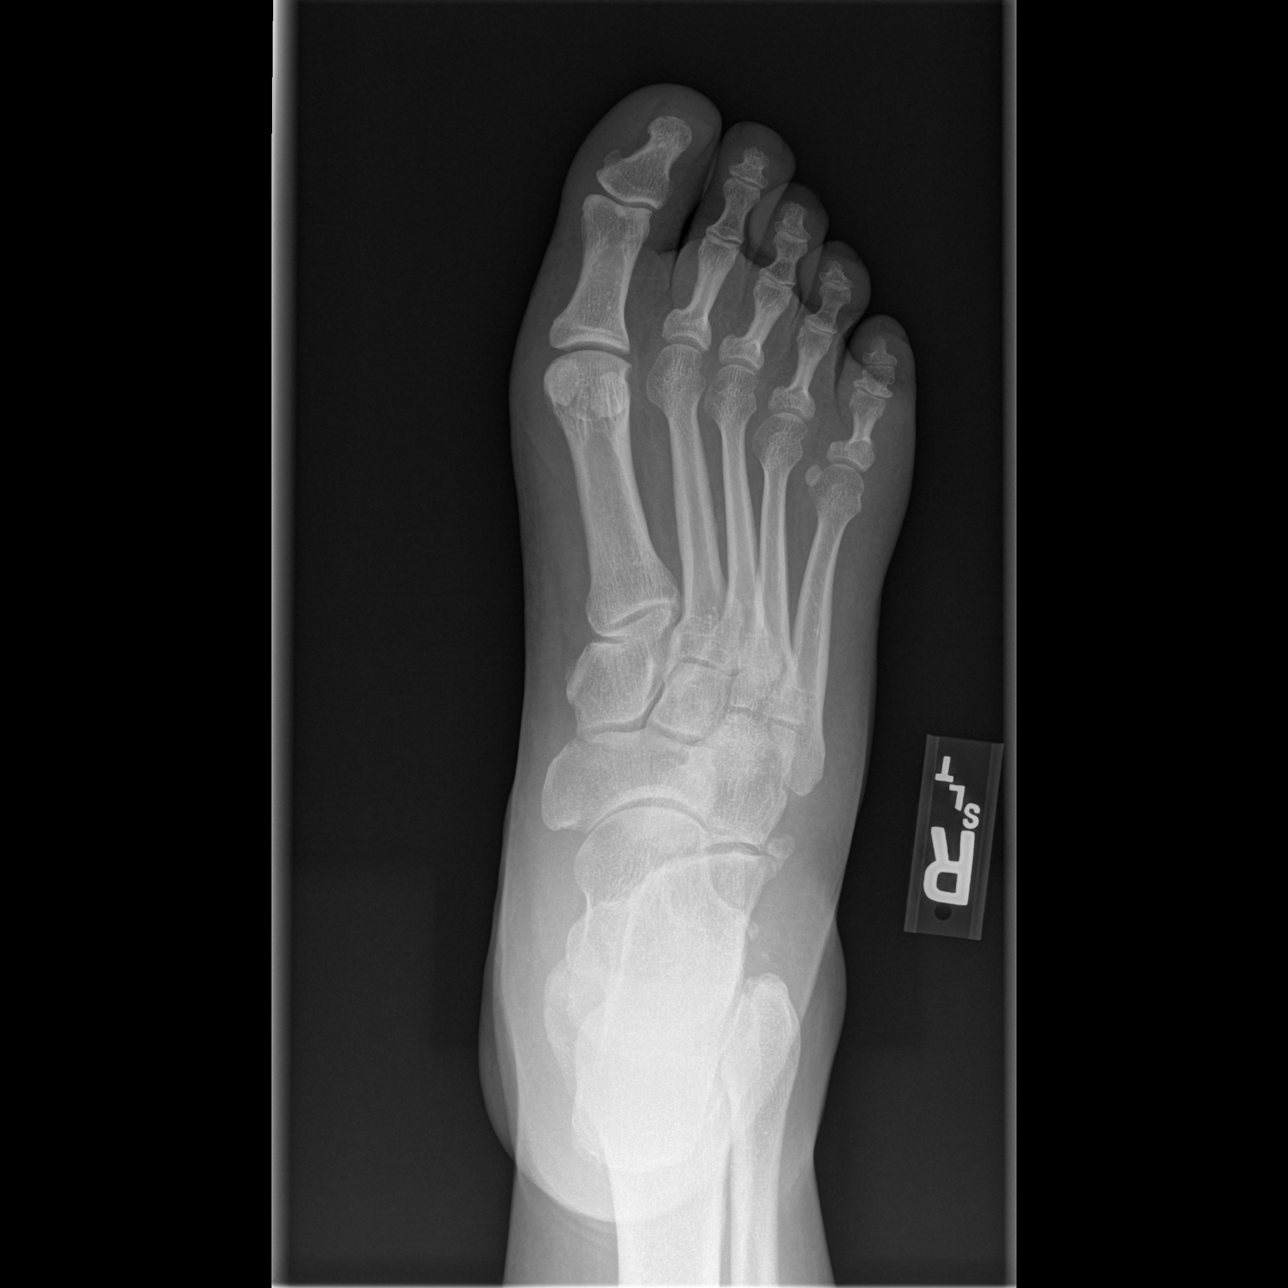

[t foot oblique right]
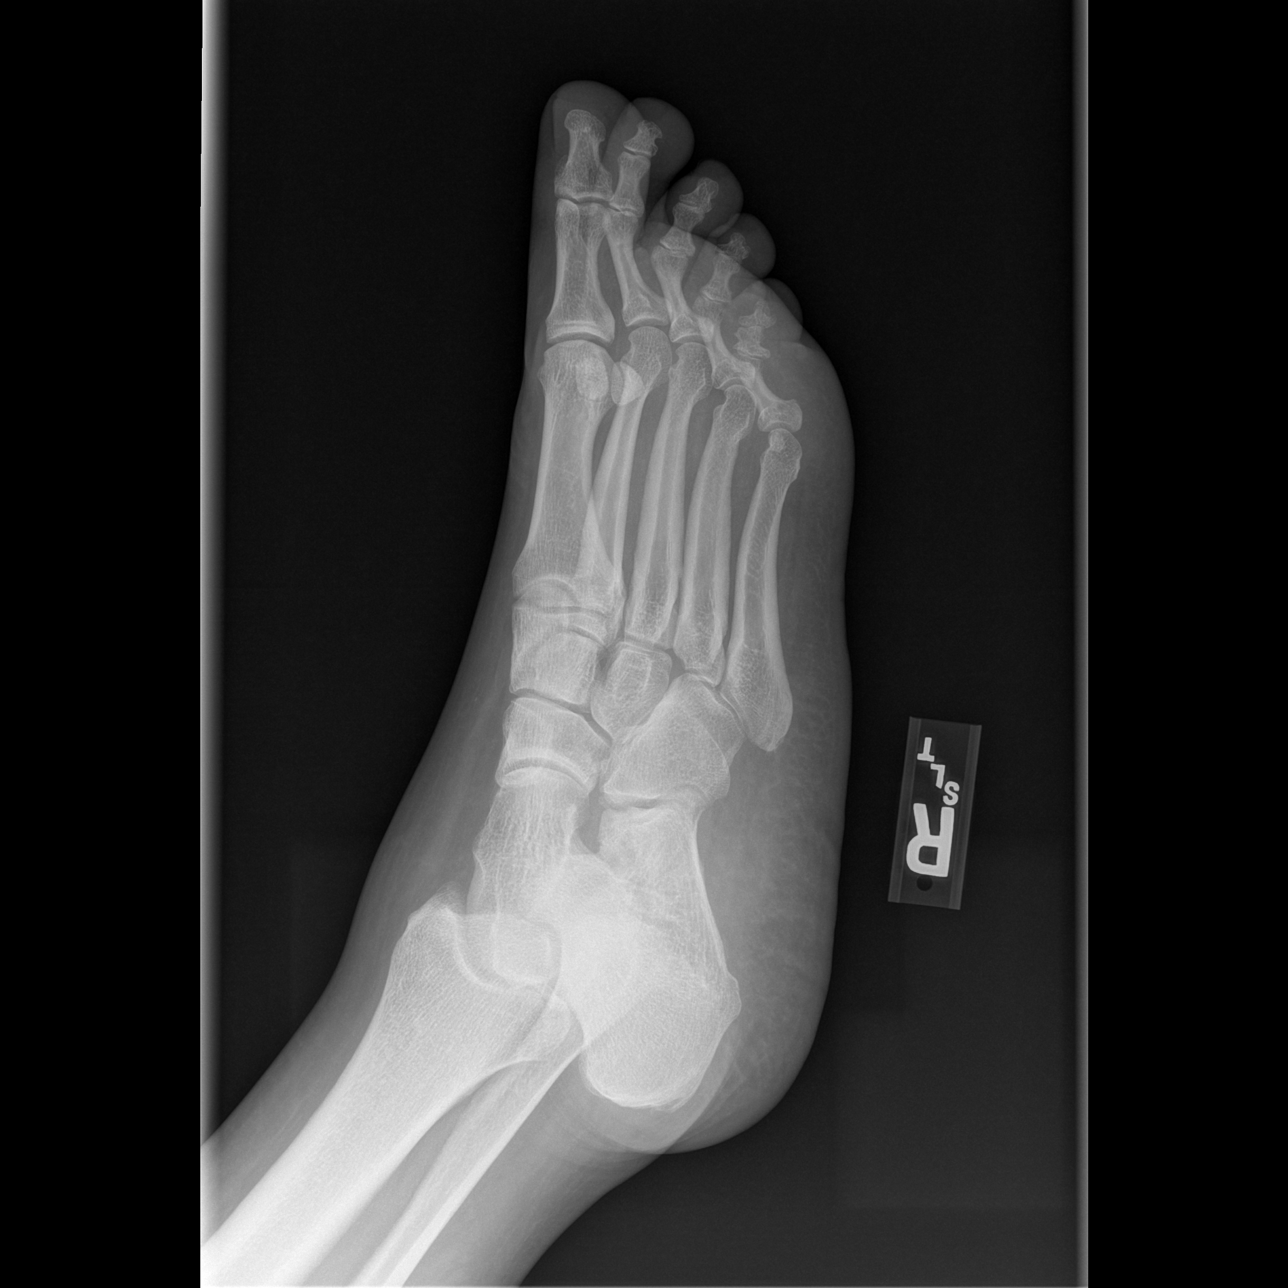

[t foot lat right]
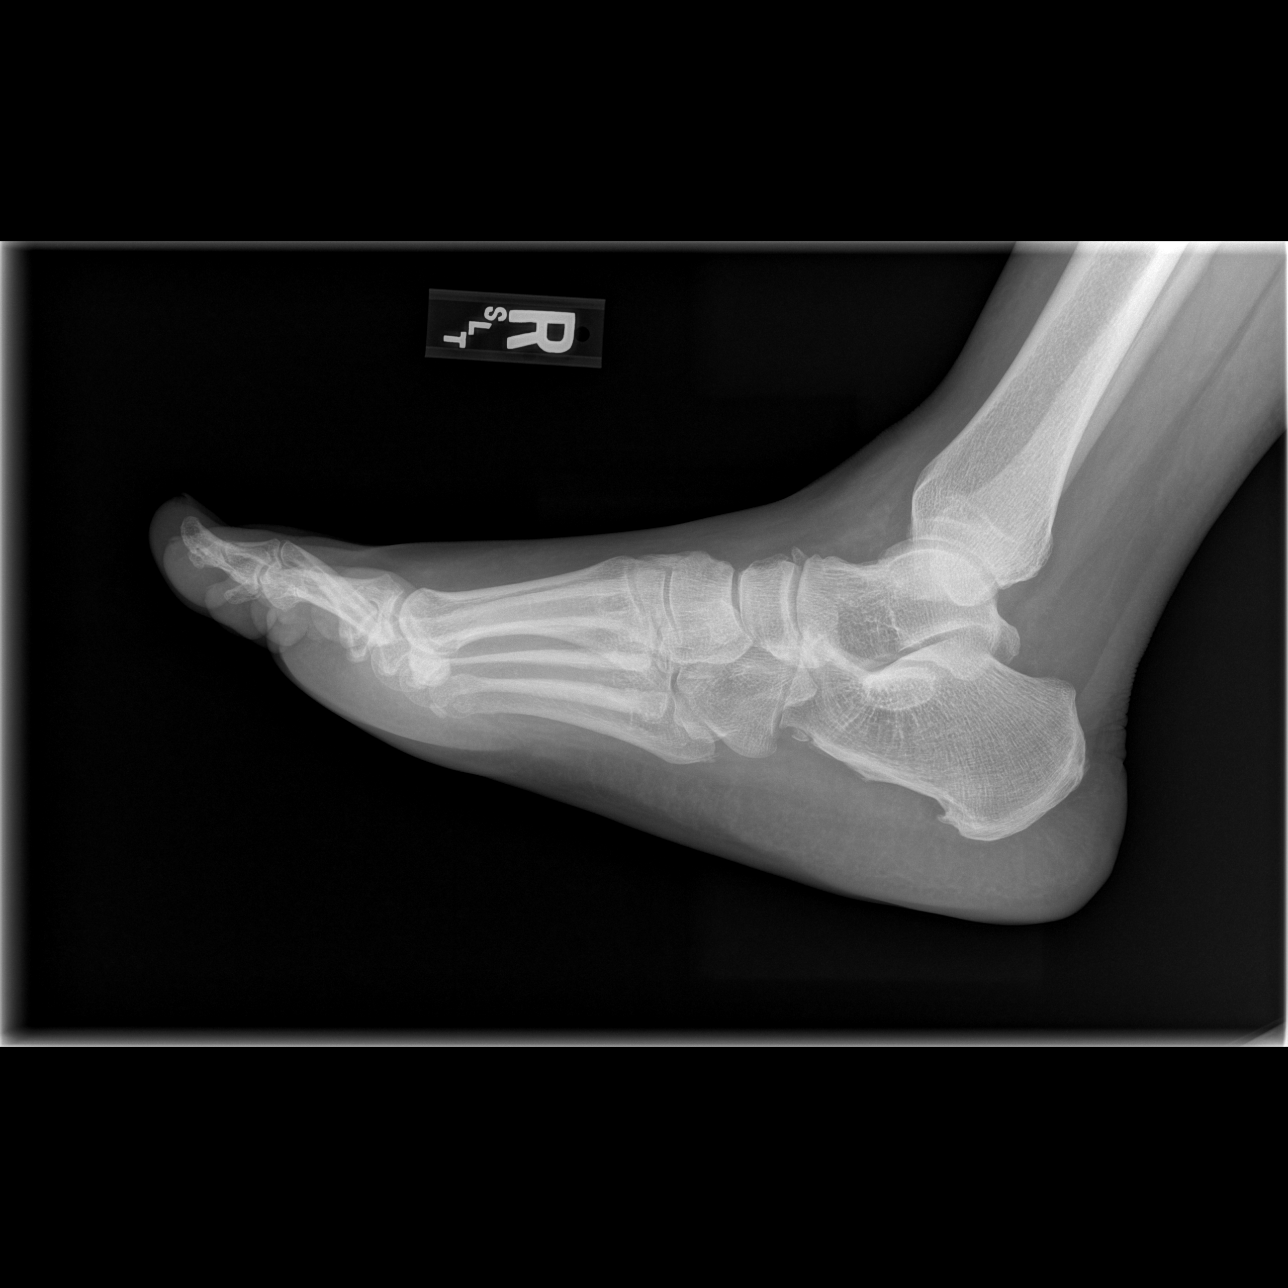

[3 of 3 positions shown; findings below may reference images not displayed]

FINDINGS: Three views of the right foot submitted.  No acute
fracture or subluxation.  There is a small plantar spur of the
calcaneus.
IMPRESSION: No acute fracture or subluxation.  Small plantar spur of the
calcaneus.

## 2011-09-29 ENCOUNTER — Ambulatory Visit (INDEPENDENT_AMBULATORY_CARE_PROVIDER_SITE_OTHER): Payer: Self-pay | Admitting: Family Medicine

## 2011-09-29 ENCOUNTER — Encounter: Payer: Self-pay | Admitting: Family Medicine

## 2011-09-29 VITALS — BP 124/79 | HR 88 | Temp 98.3°F | Ht 69.0 in | Wt 336.6 lb

## 2011-09-29 DIAGNOSIS — J020 Streptococcal pharyngitis: Secondary | ICD-10-CM

## 2011-09-29 DIAGNOSIS — J029 Acute pharyngitis, unspecified: Secondary | ICD-10-CM

## 2011-09-29 MED ORDER — PENICILLIN V POTASSIUM 500 MG PO TABS: 500.0000 mg | ORAL_TABLET | Freq: Two times a day (BID) | ORAL | Status: AC

## 2011-09-29 NOTE — Patient Instructions (Addendum)

## 2011-09-30 ENCOUNTER — Telehealth: Payer: Self-pay | Admitting: Emergency Medicine

## 2011-09-30 DIAGNOSIS — J020 Streptococcal pharyngitis: Secondary | ICD-10-CM | POA: Insufficient documentation

## 2011-09-30 NOTE — Progress Notes (Signed)
  Subjective:    Patient ID: [name] [name], female    DOB: October 06, 1975, 36 y.o.   MRN: [id number]  HPI Pt that comes today presenting fever and sore throat since yesterday. 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 There was a child on her room that was positive strep pharyngitis during this week. As mentioned, yesterday she develop fever of 103 that was followed by sore throat and general malaise. No congestion, rhinorrhea or cough. No SOB or chest pain. Decrease appetite,  mild nausea but no vomiting. No urinary symptoms or diarrhea. She took ibuprofen with no relieve of her symptoms.   Review of Systems Per HPI     Objective:   Physical Exam  Constitutional: She is oriented to person, place, and time. She appears distressed.       Due to sore throat and general malaise.  HENT:  Mouth/Throat: Oropharyngeal exudate present.  Eyes: Conjunctivae and EOM are normal.  Neck: Normal range of motion. Neck supple.  Cardiovascular: Normal rate and regular rhythm.   No murmur heard. Pulmonary/Chest: Effort normal and breath sounds normal.  Abdominal: Soft. Bowel sounds are normal.  Musculoskeletal: She exhibits no edema.  Lymphadenopathy:    She has cervical adenopathy.  Neurological: She is alert and oriented to person, place, and time.       No neurologic focalization.          Assessment & Plan:

## 2011-09-30 NOTE — Telephone Encounter (Signed)
Patient called emergency line due to continued throat pain.  She was seen in clinic yesterday and diagnosed with strep throat.  She was prescribed PCN and told to take tylenol and ibuprofen for pain.  She continues to have throat pain, congestion and is starting to get some bilaterally ear pain.  No fevers.  Discussed using OTC chloraseptic and sudafed to help with pain.  If no improvement in next 24 hours, recommended going to urgent care vs ED to be evaluated.  Patient expressed understanding and agreement.

## 2011-09-30 NOTE — Assessment & Plan Note (Signed)
Symptomatic, positive rapid strep. Hx of sick contact. Plan: Penicillin V 500 mg BID for 10 days.  Ibuprofen and acetaminophen Q6h alterning.  Discussed with pt red flags and reasons that warrant re-evaluation. Pt voiced understanding. F/U as needed.

## 2011-10-12 ENCOUNTER — Ambulatory Visit (INDEPENDENT_AMBULATORY_CARE_PROVIDER_SITE_OTHER): Payer: Self-pay | Admitting: Family Medicine

## 2011-10-12 ENCOUNTER — Encounter: Payer: Self-pay | Admitting: Family Medicine

## 2011-10-12 VITALS — BP 128/86 | HR 82 | Temp 98.9°F | Ht 68.0 in | Wt 336.0 lb

## 2011-10-12 DIAGNOSIS — R5383 Other fatigue: Secondary | ICD-10-CM

## 2011-10-12 DIAGNOSIS — R5381 Other malaise: Secondary | ICD-10-CM

## 2011-10-12 LAB — CBC WITH DIFFERENTIAL/PLATELET
Basophils Relative: 0 % (ref 0–1)
Eosinophils Absolute: 0.2 10*3/uL (ref 0.0–0.7)
Lymphs Abs: 2.9 10*3/uL (ref 0.7–4.0)
MCH: 28.1 pg (ref 26.0–34.0)
MCHC: 33.7 g/dL (ref 30.0–36.0)
Neutro Abs: 4.1 10*3/uL (ref 1.7–7.7)
Neutrophils Relative %: 54 % (ref 43–77)
Platelets: 238 10*3/uL (ref 150–400)
RBC: 3.95 MIL/uL (ref 3.87–5.11)

## 2011-10-12 LAB — BASIC METABOLIC PANEL
Calcium: 9.4 mg/dL (ref 8.4–10.5)
Potassium: 4.3 mEq/L (ref 3.5–5.3)
Sodium: 141 mEq/L (ref 135–145)

## 2011-10-12 NOTE — Patient Instructions (Addendum)
It was nice to meet you today.  I am sorry you are not feeling well. Please try to avoid napping during the day and avoid caffeine drinks after 4 PM. We will try to contact [facility] to titrate your CPAP. We will call you or send a letter with results of your lab work in the next few days. Schedule follow up appointment with your PCP in 2 weeks.  Fatigue Fatigue is a feeling of tiredness, lack of energy, lack of motivation, or feeling tired all the time. Having enough rest, good nutrition, and reducing stress will normally reduce fatigue. Consult your caregiver if it persists. The nature of your fatigue will help your caregiver to find out its cause. The treatment is based on the cause.  CAUSES  There are many causes for fatigue. Most of the time, fatigue can be traced to one or more of your habits or routines. Most causes fit into one or more of three general areas. They are: Lifestyle problems  Sleep disturbances.   Overwork.   Physical exertion.   Unhealthy habits.   Poor eating habits or eating disorders.   Alcohol and/or drug use .   Lack of proper nutrition (malnutrition).  Psychological problems  Stress and/or anxiety problems.   Depression.   Grief.   Boredom.  Medical Problems or Conditions  Anemia.   Pregnancy.   Thyroid gland problems.   Recovery from major surgery.   Continuous pain.   Emphysema or asthma that is not well controlled   Allergic conditions.   Diabetes.   Infections (such as mononucleosis).   Obesity.   Sleep disorders, such as sleep apnea.   Heart failure or other heart-related problems.   Cancer.   Kidney disease.   Liver disease.   Effects of certain medicines such as antihistamines, cough and cold remedies, prescription pain medicines, heart and blood pressure medicines, drugs used for treatment of cancer, and some antidepressants.  SYMPTOMS  The symptoms of fatigue include:   Lack of energy.   Lack of  drive (motivation).   Drowsiness.   Feeling of indifference to the surroundings.  DIAGNOSIS  The details of how you feel help guide your caregiver in finding out what is causing the fatigue. You will be asked about your present and past health condition. It is important to review all medicines that you take, including prescription and non-prescription items. A thorough exam will be done. You will be questioned about your feelings, habits, and normal lifestyle. Your caregiver may suggest blood tests, urine tests, or other tests to look for common medical causes of fatigue.  TREATMENT  Fatigue is treated by correcting the underlying cause. For example, if you have continuous pain or depression, treating these causes will improve how you feel. Similarly, adjusting the dose of certain medicines will help in reducing fatigue.  HOME CARE INSTRUCTIONS   Try to get the required amount of good sleep every night.   Eat a healthy and nutritious diet, and drink enough water throughout the day.   Practice ways of relaxing (including yoga or meditation).   Exercise regularly.   Make plans to change situations that cause stress. Act on those plans so that stresses decrease over time. Keep your work and personal routine reasonable.   Avoid street drugs and minimize use of alcohol.   Start taking a daily multivitamin after consulting your caregiver.  SEEK MEDICAL CARE IF:   You have persistent tiredness, which cannot be accounted for.   You  have fever.   You have unintentional weight loss.   You have headaches.   You have disturbed sleep throughout the night.   You are feeling sad.   You have constipation.   You have dry skin.   You have gained weight.   You are taking any new or different medicines that you suspect are causing fatigue.   You are unable to sleep at night.   You develop any unusual swelling of your legs or other parts of your body.  SEEK IMMEDIATE MEDICAL CARE IF:    You are feeling confused.   Your vision is blurred.   You feel faint or pass out.   You develop severe headache.   You develop severe abdominal, pelvic, or back pain.   You develop chest pain, shortness of breath, or an irregular or fast heartbeat.   You are unable to pass a normal amount of urine.   You develop abnormal bleeding such as bleeding from the rectum or you vomit blood.   You have thoughts about harming yourself or committing suicide.   You are worried that you might harm someone else.  MAKE SURE YOU:   Understand these instructions.   Will watch your condition.   Will get help right away if you are not doing well or get worse.  Document Released: 12/18/2006 Document Revised: 02/09/2011 Document Reviewed: 12/18/2006 [facility] Patient Information 2012 [place], [place].

## 2011-10-13 ENCOUNTER — Telehealth: Payer: Self-pay | Admitting: Family Medicine

## 2011-10-13 NOTE — Telephone Encounter (Signed)
Is asking for results of her labs from yesterday

## 2011-10-13 NOTE — Telephone Encounter (Signed)
Called.

## 2011-10-13 NOTE — Telephone Encounter (Signed)
Called pt and informed of labs are normal, except hgb is low. Was low in past. Pt said, that she is taking iron tablets. Fwd. To Dr.Losq for review. Lorenda Hatchet, Renato Battles

## 2011-10-15 ENCOUNTER — Encounter: Payer: Self-pay | Admitting: Family Medicine

## 2011-10-15 NOTE — Assessment & Plan Note (Addendum)
Fatigue of mult-factorial etiology: history of narcolepsy vs. Viral syndrome from strep pharyngitis vs. Hypothyroid vs. Depressive disorder.  - Reassured patient that fatigue may be coming from recent dx of strep pharyngitis  - Will check CBC, TSH, and electrolytes to rule out anemia, thyroid disease, diabetes, or electrolyte abnormalities - Patient did not talk to me about hx of narcolepsy, but perhaps she would benefit referral to Neurology (per Dr. Zollie Pee records). - Patient denies any feelings of depressed mood, but could consider PHQ-9 survey at follow up visit with PCP - No documented hx of OSA but fatigue may be secondary to non-compliance with CPAP machine - Follow up with PCP this month or sooner as needed

## 2011-10-15 NOTE — Progress Notes (Signed)
  Subjective:    Patient ID: [name] [name], female    DOB: [birth year], 36 y.o.   MRN: [id number]  HPI  Patient presents to same day clinic for increasing fatigue.  Patient says fatigue has been going on for about one year.  She saw her previous PCP, Dr. Denyse Amass, for this issue about one year ago.    Patient says she has felt more tired in the last 2 weeks.  She was diagnosed with strep pharyngitis one week ago and is currently on antibiotics.  She states that she has been falling asleep at work and taking multiple naps during the day.  She reports no problems sleeping at night.  She does admit to starting physical activity in the last 2 weeks.  She currently works out twice per day 3 days/week with an aerobics "fit camp." She wants to be able to work out and lose weight, but she feels too exhausted and is very frustrated.  She uses a CPAP at night, but it needs to be titrated and her next appointment with [facility] is in one month.  Patient is concerned her CPAP's settings are not correct and may be contributing to fatigue.    Patient denies any change in appetite, denies any polyuria, polydipsia, or polyphagia.  Denies any associated fever chills or nausea.    Review of Systems  Per HPI    Objective:   Physical Exam  Constitutional:       Tearful, but in no acute distress  Neck: Neck supple.  Cardiovascular: Normal rate and normal heart sounds.   Pulmonary/Chest: Effort normal and breath sounds normal. She has no wheezes. She has no rales.  Neurological: She is alert. No cranial nerve deficit.          Assessment & Plan:

## 2011-10-31 ENCOUNTER — Encounter: Payer: Self-pay | Admitting: Family Medicine

## 2011-11-07 ENCOUNTER — Ambulatory Visit (INDEPENDENT_AMBULATORY_CARE_PROVIDER_SITE_OTHER): Payer: Self-pay | Admitting: Family Medicine

## 2011-11-07 ENCOUNTER — Encounter: Payer: Self-pay | Admitting: Family Medicine

## 2011-11-07 VITALS — BP 116/76 | HR 87 | Ht 68.0 in | Wt 333.0 lb

## 2011-11-07 DIAGNOSIS — E785 Hyperlipidemia, unspecified: Secondary | ICD-10-CM

## 2011-11-07 DIAGNOSIS — R0789 Other chest pain: Secondary | ICD-10-CM

## 2011-11-07 DIAGNOSIS — L301 Dyshidrosis [pompholyx]: Secondary | ICD-10-CM

## 2011-11-07 DIAGNOSIS — R071 Chest pain on breathing: Secondary | ICD-10-CM

## 2011-11-07 DIAGNOSIS — D509 Iron deficiency anemia, unspecified: Secondary | ICD-10-CM

## 2011-11-07 DIAGNOSIS — R5381 Other malaise: Secondary | ICD-10-CM

## 2011-11-07 DIAGNOSIS — K120 Recurrent oral aphthae: Secondary | ICD-10-CM

## 2011-11-07 DIAGNOSIS — E669 Obesity, unspecified: Secondary | ICD-10-CM

## 2011-11-07 DIAGNOSIS — R5383 Other fatigue: Secondary | ICD-10-CM

## 2011-11-07 NOTE — Patient Instructions (Addendum)
We will check your cholesterol. Come back for a lab appointment, fasting, no food or drink after midnight.   For the hand, you can use eucerin daily and nightly. I'll see you back in 1 month to see how it is. If it's worst or not better, we can try and steroid cream.  For the tongue sore, it's probably an aphthous sore. It tends to happen with stress.   For the blood sore on your finger, ice it and it should go down.   Keep up the exercise!!   For the foot, you can take ibuprofen 2 tablets with food before exercising. I'll see you back in 1 month and we can talk about it some more.

## 2011-11-12 DIAGNOSIS — R0789 Other chest pain: Secondary | ICD-10-CM | POA: Insufficient documentation

## 2011-11-12 DIAGNOSIS — K120 Recurrent oral aphthae: Secondary | ICD-10-CM | POA: Insufficient documentation

## 2011-11-12 DIAGNOSIS — L301 Dyshidrosis [pompholyx]: Secondary | ICD-10-CM | POA: Insufficient documentation

## 2011-11-12 NOTE — Assessment & Plan Note (Addendum)
Check fasting lipid panel and LFT's. Last LDL: 130 in 2011.

## 2011-11-12 NOTE — Assessment & Plan Note (Addendum)
Likely costochondritis in nature aggravated with vigorous exercise and tenderness to palpation. Given location, less likely cardiac, but given obesity, will have low threshold to evaluate patient for stress test. Given lack of associated shortness of breath and pleuritic pain, unlikely PE.  Chest pain precautions reviewed with patient. Patient to return in 1 month for follow up or sooner if pain recurs or becomes worst.

## 2011-11-12 NOTE — Assessment & Plan Note (Addendum)
Hemoglobin of 11.1 on 10/12/11. Encouraged patient to take ferrous sulfate bid.

## 2011-11-12 NOTE — Progress Notes (Signed)
Patient ID: [name] [name], female   DOB: 1975/11/21, 36 y.o.   MRN: [id number] Patient ID: [name] [name]    DOB: February 22, 1976, 36 y.o.   MRN: [id number] --- Subjective:  [name] [name] is a 36 y.o.female who presents to meet new doctor and for physical. Concerns include the following: - diet and exercise: started new exercise routine with program called [employer], does squats and aerobics 3 days per week, twice daily. This is the 3rd month she has been doing this. SHe feels that her energy level has much improved since starting. SHe notices that after running on the treadmill for a few minutes, she has right sided chest pain that is sharp, lasts a few minutes and goes away with resting. Doesn't radiate anywhere. She denies any pleuritic chest pain. No new lower extremity edema. No increased shortness of breath at rest.  - weight loss: part of the program includes taking herba life and cellulose. - sleep apnea: was recently readjusted to CPAP machine so that there is a range of calibration from 0-20. She is tolerating the machine much better and feels less tired.  - finger: 1 week ago noticed left index finger had bubble under skin which changed color and became progressively darker and blue. Pain with touching. No redness around it, no warmth. No trauma that she recalls.  - hand dryness: areas of dryness along palm of hands. Patient picks at area and area becomes painful. Has been using some lotion during the day, without true relief.  - right tongue sore that comes and goes, always at same spot, mildly painful when present. Goes away on its own.  - well woman exam: h/o complete hysterectomy in 2008 secondary to endometriosis. Used to take estrogen but stopped secondary to worry of weight gain. Has noticed facial hair since she stopped taking it.  Denies any exposure to STD. Last intercourse was a few months ago. Denies vaginal discharge, odor. Doesn't do home breast exams.  Family history gyn: cousin  with breast cancer   ROS: see HPI Past Medical History: reviewed and updated medications and allergies. Social History: Tobacco: denies  Objective: Filed Vitals:   11/07/11 1527  BP: 116/76  Pulse: 87    Physical Examination:   General appearance - alert, well appearing, and in no distress, morbidly obese Chest - clear to auscultation, no wheezes, rales or rhonchi, symmetric air entry Heart - normal rate, regular rhythm, normal S1, S2, no murmurs, rubs, clicks or gallops Abdomen - soft, nontender, nondistended, no masses or organomegaly Extremities - peripheral pulses normal, no pedal edema, no clubbing or cyanosis Breast exam - fibrocystic changes but no immobile or hard mass felt, tenderness to palpation bilaterally, no nipple discharge Left index finger - small hematoma present on pad of finger, no warmth, no redness.   Skin - hands: dry skin with dry blisters.

## 2011-11-12 NOTE — Assessment & Plan Note (Addendum)
Improved with new CPAP for sleep apnea as well as new exercise routine. No longer falling asleep during the day. Will continue to monitor.

## 2011-11-12 NOTE — Assessment & Plan Note (Signed)
Given description, location and presentation on physical exam, likely eczema. Encouraged hydration with eucerin ointment at night time. Patient not wanting to try steroid cream at this moment.

## 2011-11-12 NOTE — Assessment & Plan Note (Signed)
Not present on exam but based on patient's description, likely aphthous ulcer. Symptomatic management recommended.

## 2011-11-12 NOTE — Assessment & Plan Note (Signed)
3 lb weight loss since 10/12/11. Enrolled in weight loss program. WIll continue to monitor progress. Encouraged patient's efforts.

## 2012-03-21 ENCOUNTER — Ambulatory Visit (INDEPENDENT_AMBULATORY_CARE_PROVIDER_SITE_OTHER): Payer: Self-pay | Admitting: Family Medicine

## 2012-03-21 ENCOUNTER — Telehealth: Payer: Self-pay | Admitting: Family Medicine

## 2012-03-21 ENCOUNTER — Encounter: Payer: Self-pay | Admitting: Family Medicine

## 2012-03-21 VITALS — BP 137/84 | HR 101 | Temp 100.1°F | Ht 69.0 in | Wt 328.0 lb

## 2012-03-21 DIAGNOSIS — K529 Noninfective gastroenteritis and colitis, unspecified: Secondary | ICD-10-CM | POA: Insufficient documentation

## 2012-03-21 DIAGNOSIS — K5289 Other specified noninfective gastroenteritis and colitis: Secondary | ICD-10-CM

## 2012-03-21 MED ORDER — ONDANSETRON HCL 4 MG PO TABS: 4.0000 mg | ORAL_TABLET | Freq: Three times a day (TID) | ORAL | Status: DC | PRN

## 2012-03-21 NOTE — Telephone Encounter (Signed)
Resident after-hours page: She has had nausea/vomiting and diarrhea since yesterday. She has not been able to eat anything since 4 pm 01/15. She is wondering if she needs to go to ED or can wait to come into clinic. I advised she call in 30 minutes when clinic opens and ask to be seen as SDA.

## 2012-03-21 NOTE — Patient Instructions (Addendum)
Viral Gastroenteritis Viral gastroenteritis is also known as stomach flu. This condition affects the stomach and intestinal tract. It can cause sudden diarrhea and vomiting. The illness typically lasts 3 to 8 days. Most people develop an immune response that eventually gets rid of the virus. While this natural response develops, the virus can make you quite ill. CAUSES  Many different viruses can cause gastroenteritis, such as rotavirus or noroviruses. You can catch one of these viruses by consuming contaminated food or water. You may also catch a virus by sharing utensils or other personal items with an infected person or by touching a contaminated surface. SYMPTOMS  The most common symptoms are diarrhea and vomiting. These problems can cause a severe loss of body fluids (dehydration) and a body salt (electrolyte) imbalance. Other symptoms may include:  Fever.  Headache.  Fatigue.  Abdominal pain. DIAGNOSIS  Your caregiver can usually diagnose viral gastroenteritis based on your symptoms and a physical exam. A stool sample may also be taken to test for the presence of viruses or other infections. TREATMENT  This illness typically goes away on its own. Treatments are aimed at rehydration. The most serious cases of viral gastroenteritis involve vomiting so severely that you are not able to keep fluids down. In these cases, fluids must be given through an intravenous line (IV). HOME CARE INSTRUCTIONS   Drink enough fluids to keep your urine clear or pale yellow. Drink small amounts of fluids frequently and increase the amounts as tolerated.  Ask your caregiver for specific rehydration instructions.  Avoid:  Foods high in sugar.  Alcohol.  Carbonated drinks.  Tobacco.  Juice.  Caffeine drinks.  Extremely hot or cold fluids.  Fatty, greasy foods.  Too much intake of anything at one time.  Dairy products until 24 to 48 hours after diarrhea stops.  You may consume probiotics.  Probiotics are active cultures of beneficial bacteria. They may lessen the amount and number of diarrheal stools in adults. Probiotics can be found in yogurt with active cultures and in supplements.  Wash your hands well to avoid spreading the virus.  Only take over-the-counter or prescription medicines for pain, discomfort, or fever as directed by your caregiver. Do not give aspirin to children. Antidiarrheal medicines are not recommended.  Ask your caregiver if you should continue to take your regular prescribed and over-the-counter medicines.  Keep all follow-up appointments as directed by your caregiver. SEEK IMMEDIATE MEDICAL CARE IF:   You are unable to keep fluids down.  You do not urinate at least once every 6 to 8 hours.  You develop shortness of breath.  You notice blood in your stool or vomit. This may look like coffee grounds.  You have abdominal pain that increases or is concentrated in one small area (localized).  You have persistent vomiting or diarrhea.  You have a fever.  The patient is a child younger than 3 months, and he or she has a fever.  The patient is a child older than 3 months, and he or she has a fever and persistent symptoms.  The patient is a child older than 3 months, and he or she has a fever and symptoms suddenly get worse.  The patient is a baby, and he or she has no tears when crying. MAKE SURE YOU:   Understand these instructions.  Will watch your condition.  Will get help right away if you are not doing well or get worse. Document Released: 02/20/2005 Document Revised: 05/15/2011 Document Reviewed: 12/07/2010   [employer] Patient Information 2013 [employer].  

## 2012-03-21 NOTE — Progress Notes (Signed)
Subjective:     [name] [name] is a 37 y.o. female who presents for evaluation of nonbilious vomiting 2 times per day and diarrhea 2 times per day. Symptoms have been present for 1 day. Patient denies blood in stool, constipation, dysuria, fever and melena. Patient's oral intake has been normal for liquids and decreased for solids. Patient's urine output has been adequate. 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 Patient denies recent travel history. Patient has not had recent ingestion of possible contaminated food, toxic plants, or inappropriate medications/poisons.   Medical History-Bipolar 1 Social history-works at daycare Allergies-none for anti nauseaa medications  Review of Systems Pertinent items are noted in HPI.  with additions has noted some chills and felt overall warm but without fever. Denies cough/cold/congestion.    Objective:     BP 137/84  Pulse 101  Temp 100.1 F (37.8 C) (Oral)  Ht 5\' 9"  (1.753 m)  Wt 328 lb (148.78 kg)  BMI 48.44 kg/m2  General Appearance:    Alert, cooperative, no distress, appears stated age  Throat:   Lips, mucosa, and tongue normal; teeth and gums normal. Slightly dry mucus membranes.   Lungs:     Clear to auscultation bilaterally, respirations unlabored   Heart:    Regular rate and rhythm, S1 and S2 normal, no murmur, rub   or gallop  Abdomen:     Soft, slight tenderness LUQ, bowel sounds active all four quadrants,    no masses, no organomegaly  Extremities:   Extremities normal, atraumatic, no cyanosis or edema  Skin:   Skin color, texture, turgor normal, no rashes or lesions      Assessment:    Acute Gastroenteritis  with multiple sick exposures.    Plan:    1. Discussed oral rehydration, reintroduction of solid foods. Need for rest. Need to not return to work until 48 hours symptom free. 2. Return to care if worsening symptoms, blood , signs of dehydration, diarrhea  lasting longer than 5 days or any new concerns. 3. Follow up in 1 week or sooner as needed.

## 2012-03-21 NOTE — Assessment & Plan Note (Signed)
Rx for zofran given as well.

## 2012-04-09 ENCOUNTER — Telehealth: Payer: Self-pay | Admitting: *Deleted

## 2012-04-09 ENCOUNTER — Other Ambulatory Visit: Payer: Self-pay | Admitting: Family Medicine

## 2012-04-09 NOTE — Telephone Encounter (Signed)
Received call from Dr. Ledon Snare . States patient is currently on Effexor extended release 75 mg ( brand ) and Lamictal 200 mg .  Has been stable up until last 6 weeks and she is depressed again now. He is recommending increasing Effexor up to 150 mg . Would like to do this is 37.5 mg increments until 150 mg is reached.   Patient has a large amount of  Effexor 75 mg on hand and can eventually use these taking two daily . Will need a months worth  of the 37.5 mg tabs. Advised will forward message to Dr. Gwenlyn Saran and call him back at [phone].

## 2012-04-09 NOTE — Telephone Encounter (Signed)
Sent fax for refill for lamictal 100mg  bid, #60tabs, 1 refill, which patient takes for bipolar disorder.

## 2012-04-10 ENCOUNTER — Other Ambulatory Visit: Payer: Self-pay | Admitting: Family Medicine

## 2012-04-11 MED ORDER — VENLAFAXINE HCL ER 37.5 MG PO TB24: 37.5000 mg | ORAL_TABLET | Freq: Every day | ORAL | Status: DC

## 2012-04-11 MED ORDER — VENLAFAXINE HCL 37.5 MG PO TABS: 37.5000 mg | ORAL_TABLET | Freq: Two times a day (BID) | ORAL | Status: DC

## 2012-04-11 NOTE — Telephone Encounter (Signed)
Addendum: will send effexor 37.5mg  ER daily instead of effexor instant release 37.5mg  bid.

## 2012-04-11 NOTE — Telephone Encounter (Signed)
Sending Rx to [facility] department for Effexor 37.5mg  bid to allow patient to increase her effexor dose to 150mg  as recommended by Dr. Ledon Snare.

## 2012-04-12 NOTE — Telephone Encounter (Signed)
Contacted Dr. Gwenlyn Saran yesterday and she advises to take Effexor 37.5 mg one daily in addition to the 75 mg tab for 2 weeks , then increase to two 37.5 in addition to the 75 mg tab.  When patient is up to 150 mg she will take two 75 mg tabs that she has on hand.

## 2012-04-15 NOTE — Telephone Encounter (Signed)
Received call Friday from [facility] advising they do not have the 37.5mg  ER Effexor in tab form , they do have in capsules. Consulted with Dr. Gwendolyn Grant and he advises OK to give capsules instead of tabs.   Contacted Dr. Jennette Banker office this AM to advises that Effexor has been increased as Dr. Ledon Snare suggested.

## 2012-04-16 ENCOUNTER — Other Ambulatory Visit: Payer: Self-pay | Admitting: Family Medicine

## 2012-04-16 IMAGING — PX DG ORTHOPANTOGRAM /PANORAMIC
1 series · 1 of 1 positions shown · non-contrast
Comparison: Limited correlation is made with the cervical spine
radiographs 09/03/2003.

CLINICAL DATA: Left jaw pain extending from the ear to the neck.
No known injury.

ORTHOPANTOGRAM

[Series 1: — · U · 1 of 1 slices shown]
[im 1/1]
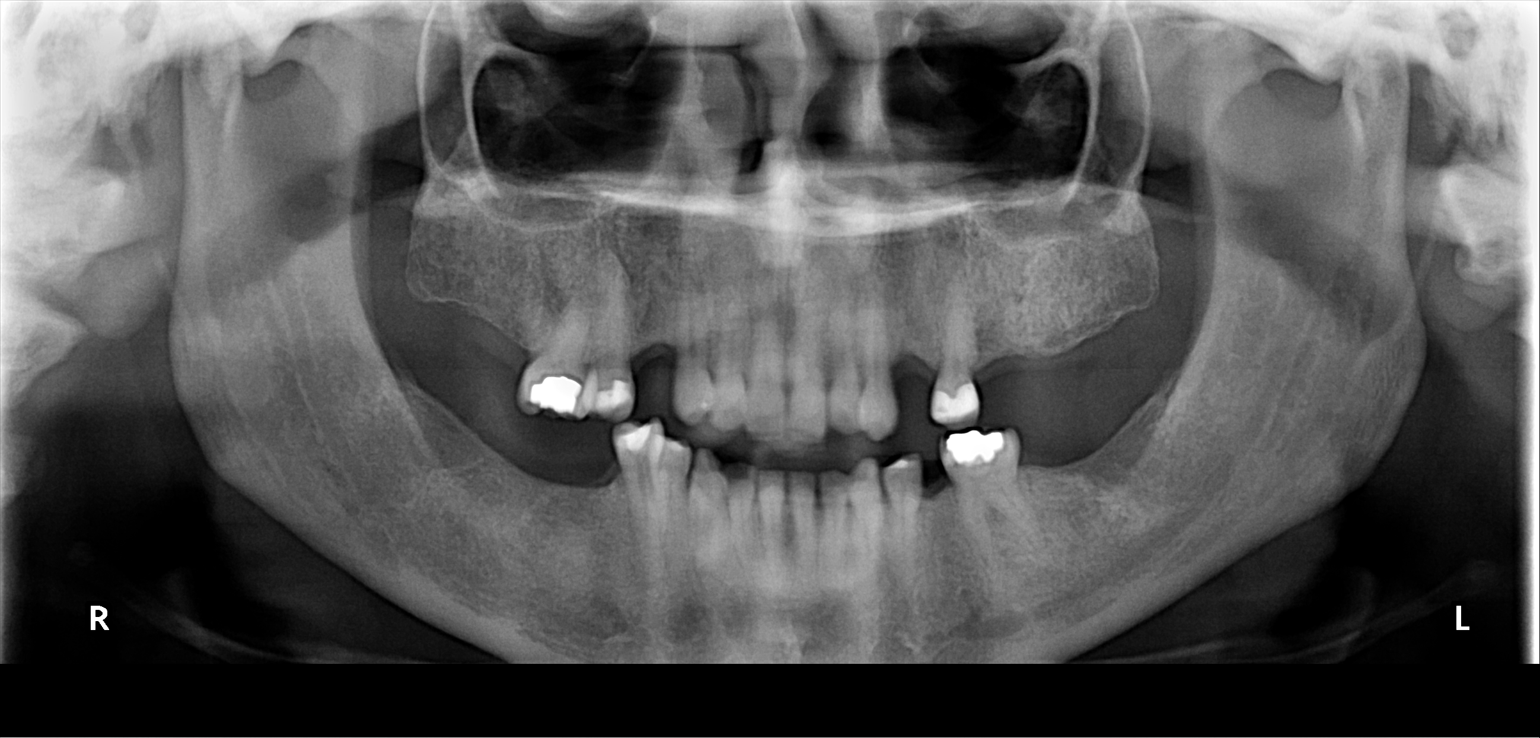

[1 of 1 positions shown; findings below may reference images not displayed]

FINDINGS: Multiple maxillary and mandibular teeth are missing.  No
significant periodontal disease is evident.  There is no evidence
of acute fracture or dislocation.  The temporomandibular joints are
incompletely visualized but appear symmetric.  There is typical
midline artifact of the Panorex technique.
IMPRESSION: No acute or significant findings.

## 2012-04-16 MED ORDER — VENLAFAXINE HCL ER 37.5 MG PO CP24: 37.5000 mg | ORAL_CAPSULE | Freq: Every day | ORAL | Status: DC

## 2012-04-16 NOTE — Telephone Encounter (Signed)
Received note from [facility] stating that effexor TB24 37.5mg  was not covered but that they could obtain effexor XR. Wrote Rx for effexor XR 37,5mg  daily 60 tabs 2 refills. As discussed previously, patient to take Effexor 37.5 mg one daily in addition to the 75 mg tab for 2 weeks and then take 150mg  daily afterwards.

## 2012-04-26 ENCOUNTER — Ambulatory Visit: Payer: PRIVATE HEALTH INSURANCE | Admitting: Family Medicine

## 2012-05-30 ENCOUNTER — Ambulatory Visit (INDEPENDENT_AMBULATORY_CARE_PROVIDER_SITE_OTHER): Payer: PRIVATE HEALTH INSURANCE | Admitting: Family Medicine

## 2012-05-30 ENCOUNTER — Encounter: Payer: Self-pay | Admitting: Family Medicine

## 2012-05-30 VITALS — BP 140/83 | HR 75 | Temp 98.3°F | Ht 69.0 in | Wt 330.1 lb

## 2012-05-30 DIAGNOSIS — J020 Streptococcal pharyngitis: Secondary | ICD-10-CM

## 2012-05-30 DIAGNOSIS — J029 Acute pharyngitis, unspecified: Secondary | ICD-10-CM

## 2012-05-30 LAB — POCT RAPID STREP A (OFFICE): Rapid Strep A Screen: NEGATIVE

## 2012-05-30 NOTE — Assessment & Plan Note (Addendum)
Likely viral syndrome on day 3 of illness and slight improvement without evidence of fever or bacterial etiology. 2/4 centor criteria, but rapid strep is negative. Treat supportively with tylenol/motrin prn, salt water gargles, fluids. F/u if fails to improve or symptoms worsen/persist > one week.  Frequent hand-washing, avoid work if febrile.

## 2012-05-30 NOTE — Progress Notes (Signed)
  Subjective:    Patient ID: [name] [name], female    DOB: 08/22/1975, 37 y.o.   MRN: [id number]  Headache   Sore Throat  Associated symptoms include headaches.    1. Sore throat/headache. Present for 3 days, worst was yesterday and feels slight improvement today. Generalized headache waxing and waning. Also having some nausea, without emesis or abdominal pain. Feels some glandular discomfort in front of her neck. Slight pain with swallowing, but no dysphagia.  She denies any fever, chills, purulent nasal discharge, wheezing, dyspnea, cough, facial pain, rhinorrhea, congestion, abdominal pain, rash, emesis, neck stiffness.  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   Review of Systems  Neurological: Positive for headaches.   See HPI otherwise negative.  reports that she has never smoked. She does not have any smokeless tobacco history on file.     Objective:   Physical Exam  Vitals reviewed. Constitutional: She appears well-developed and well-nourished. No distress.  HENT:  Head: Normocephalic and atraumatic.  Right Ear: External ear normal.  Left Ear: External ear normal.  Nose: Nose normal.  Mouth/Throat: No oropharyngeal exudate.  Mild tonsillar hypertrophy. No sig erythema or exudate.  Eyes: EOM are normal. Pupils are equal, round, and reactive to light.  Neck: Normal range of motion. Neck supple. No thyromegaly present.  Shotty ant cervical LAD.  Cardiovascular: Normal rate, regular rhythm and normal heart sounds.   No murmur heard. Pulmonary/Chest: Effort normal and breath sounds normal. No respiratory distress. She has no wheezes. She has no rales.  Abdominal: Soft. There is no tenderness.  Obese.  Lymphadenopathy:    She has cervical adenopathy.  Skin: She is not diaphoretic.       Assessment & Plan:

## 2012-05-30 NOTE — Patient Instructions (Addendum)
You seem to have a virus causing sore throat.  Should slowly improve in next 5-7 days.  Try tylenol, motrin, salt water gargles or throat spray for symptoms. If you have fevers or symptoms persist >7 days, then call doctor or make another appointment as you may develop bacterial infection.   Viral Infections A viral infection can be caused by different types of viruses.Most viral infections are not serious and resolve on their own. However, some infections may cause severe symptoms and may lead to further complications. SYMPTOMS Viruses can frequently cause:  Minor sore throat.  Aches and pains.  Headaches.  Runny nose.  Different types of rashes.  Watery eyes.  Tiredness.  Cough.  Loss of appetite.  Gastrointestinal infections, resulting in nausea, vomiting, and diarrhea. These symptoms do not respond to antibiotics because the infection is not caused by bacteria. However, you might catch a bacterial infection following the viral infection. This is sometimes called a "superinfection." Symptoms of such a bacterial infection may include:  Worsening sore throat with pus and difficulty swallowing.  Swollen neck glands.  Chills and a high or persistent fever.  Severe headache.  Tenderness over the sinuses.  Persistent overall ill feeling (malaise), muscle aches, and tiredness (fatigue).  Persistent cough.  Yellow, green, or brown mucus production with coughing. HOME CARE INSTRUCTIONS   Only take over-the-counter or prescription medicines for pain, discomfort, diarrhea, or fever as directed by your caregiver.  Drink enough water and fluids to keep your urine clear or pale yellow. Sports drinks can provide valuable electrolytes, sugars, and hydration.  Get plenty of rest and maintain proper nutrition. Soups and broths with crackers or rice are fine. SEEK IMMEDIATE MEDICAL CARE IF:   You have severe headaches, shortness of breath, chest pain, neck pain, or an unusual  rash.  You have uncontrolled vomiting, diarrhea, or you are unable to keep down fluids.  You or your child has an oral temperature above 102 F (38.9 C), not controlled by medicine.  Your baby is older than 3 months with a rectal temperature of 102 F (38.9 C) or higher.  Your baby is 81 months old or younger with a rectal temperature of 100.4 F (38 C) or higher. MAKE SURE YOU:   Understand these instructions.  Will watch your condition.  Will get help right away if you are not doing well or get worse. Document Released: 11/30/2004 Document Revised: 05/15/2011 Document Reviewed: 06/27/2010 [facility] Patient Information 2013 [place], [place].

## 2012-06-13 ENCOUNTER — Other Ambulatory Visit: Payer: Self-pay | Admitting: Family Medicine

## 2012-06-13 NOTE — Telephone Encounter (Signed)
Faxed refill for lamictal 200mg  daily for bipolar 60 tablets 3 refills. Sent to [facility].   Marena Chancy, PGY-2 Family Medicine Resident

## 2012-06-24 ENCOUNTER — Other Ambulatory Visit: Payer: PRIVATE HEALTH INSURANCE

## 2012-06-24 DIAGNOSIS — E785 Hyperlipidemia, unspecified: Secondary | ICD-10-CM

## 2012-06-24 DIAGNOSIS — E669 Obesity, unspecified: Secondary | ICD-10-CM

## 2012-06-24 LAB — COMPLETE METABOLIC PANEL WITH GFR
ALT: 11 U/L (ref 0–35)
AST: 14 U/L (ref 0–37)
Albumin: 3.9 g/dL (ref 3.5–5.2)
Alkaline Phosphatase: 83 U/L (ref 39–117)
Calcium: 9.4 mg/dL (ref 8.4–10.5)
Chloride: 105 mEq/L (ref 96–112)
Potassium: 4.1 mEq/L (ref 3.5–5.3)
Sodium: 141 mEq/L (ref 135–145)
Total Protein: 7.1 g/dL (ref 6.0–8.3)

## 2012-06-24 LAB — LIPID PANEL
Cholesterol: 215 mg/dL — ABNORMAL HIGH (ref 0–200)
Total CHOL/HDL Ratio: 3.3 Ratio
VLDL: 14 mg/dL (ref 0–40)

## 2012-06-24 NOTE — Progress Notes (Signed)
CMP AND FLP DONE TODAY [name] [name] 

## 2012-06-27 ENCOUNTER — Encounter: Payer: Self-pay | Admitting: Family Medicine

## 2012-07-11 ENCOUNTER — Encounter: Payer: Self-pay | Admitting: Family Medicine

## 2012-07-11 ENCOUNTER — Ambulatory Visit (INDEPENDENT_AMBULATORY_CARE_PROVIDER_SITE_OTHER): Payer: PRIVATE HEALTH INSURANCE | Admitting: Family Medicine

## 2012-07-11 VITALS — BP 128/74 | HR 100 | Temp 100.2°F | Ht 69.0 in | Wt 331.0 lb

## 2012-07-11 DIAGNOSIS — J301 Allergic rhinitis due to pollen: Secondary | ICD-10-CM

## 2012-07-11 DIAGNOSIS — F319 Bipolar disorder, unspecified: Secondary | ICD-10-CM

## 2012-07-11 DIAGNOSIS — R7301 Impaired fasting glucose: Secondary | ICD-10-CM

## 2012-07-11 DIAGNOSIS — Z9109 Other allergy status, other than to drugs and biological substances: Secondary | ICD-10-CM

## 2012-07-11 DIAGNOSIS — E119 Type 2 diabetes mellitus without complications: Secondary | ICD-10-CM

## 2012-07-11 MED ORDER — VENLAFAXINE HCL ER 75 MG PO TB24: 75.0000 mg | ORAL_TABLET | Freq: Every day | ORAL | Status: DC

## 2012-07-11 MED ORDER — METFORMIN HCL 500 MG PO TABS: 500.0000 mg | ORAL_TABLET | Freq: Two times a day (BID) | ORAL | Status: DC

## 2012-07-11 MED ORDER — LAMOTRIGINE 100 MG PO TABS: 200.0000 mg | ORAL_TABLET | Freq: Every day | ORAL | Status: DC

## 2012-07-11 MED ORDER — HYDROCHLOROTHIAZIDE 25 MG PO TABS: 25.0000 mg | ORAL_TABLET | Freq: Every day | ORAL | Status: DC

## 2012-07-11 NOTE — Progress Notes (Signed)
Patient ID: [name] [name], female   DOB: 1975/09/08, 37 y.o.   MRN: [id number] Subjective: The patient is a 37 y.o. year old female who presents today for multiple complaints.  1. Allergies: Patient is having problems with clear rhinorrhea, scratchy throat, dry cough,, and fullness in her years. This been going on for 2-3 weeks. She denies any fevers, chills, productive cough, or shortness of breath. She is not taking any medications for this.  2. Bipolar disorder: Patient reports that her symptoms are currently well controlled but that she is about to medications. She would like refills. She is not having any problems with also self-harm.  3. Lab followup: Patient requesting interpretation of her labs. Noted to have elevated blood sugar that is confirmed to be fasting and mildly elevated cholesterol.  Patient's past medical, social, and family history were reviewed and updated as appropriate. History  Substance Use Topics  . Smoking status: Never Smoker   . Smokeless tobacco: Not on file  . Alcohol Use: No   Objective:  Filed Vitals:   07/11/12 1409  BP: 128/74  Pulse: 100  Temp: 100.2 F (37.9 C)   Gen: Morbidly obese female, no acute distress HEENT: Mucous members was, extra movements intact. Allergic shiners present bilaterally clear rhinorrhea. Mildly boggy nasal turbinates. Postnasal drip present. Because of sternal present. No cervical adenopathy.  Assessment/Plan:  Please also see individual problems in problem list for problem-specific plans.

## 2012-07-11 NOTE — Assessment & Plan Note (Signed)
Please see patient instructions for plan

## 2012-07-11 NOTE — Assessment & Plan Note (Signed)
Refills provided today.

## 2012-07-11 NOTE — Patient Instructions (Addendum)
You have diabetes.  We need to start some medication for this.  The medication we will start is Metformin.  Take one pill daily for 2 weeks, then go up to one pill 2 times per day.  Come back to see [name] [name] when you have been on this for a month. You have slightly high cholesterol.  It is not high enough that we need to treat it, but it is something we will need to keep checking on. I have given you refills on your other medications. You do not have strep throat.  You have allergies.  Try some of the over the counter allergy medications for this.

## 2012-07-11 NOTE — Assessment & Plan Note (Signed)
New-onset diabetes, confirmed by A1c today. Patient was counseled on diet and exercise. We'll also start metformin and plan followup in 4-6 weeks' time.

## 2012-08-26 ENCOUNTER — Telehealth: Payer: Self-pay | Admitting: *Deleted

## 2012-08-26 NOTE — Telephone Encounter (Signed)
Pt requesting refill on Premarin - does not look like they are still taking per [facility] - please advise. Wyatt Haste, RN-BSN

## 2012-08-27 NOTE — Telephone Encounter (Signed)
Will refill premarin this time, but would like to see patient back to discuss prior to next refill.

## 2012-08-28 NOTE — Telephone Encounter (Signed)
Called pt and left message that prescription would be here for her to pick up this afternoon and she will have to have an appointment prior to next refill. Wyatt Haste, RN-BSN

## 2012-10-29 ENCOUNTER — Encounter: Payer: Self-pay | Admitting: Family Medicine

## 2012-10-29 ENCOUNTER — Ambulatory Visit (INDEPENDENT_AMBULATORY_CARE_PROVIDER_SITE_OTHER): Payer: Self-pay | Admitting: Family Medicine

## 2012-10-29 VITALS — BP 136/85 | HR 83 | Temp 98.0°F | Ht 69.0 in | Wt 319.0 lb

## 2012-10-29 DIAGNOSIS — E119 Type 2 diabetes mellitus without complications: Secondary | ICD-10-CM

## 2012-10-29 DIAGNOSIS — R1012 Left upper quadrant pain: Secondary | ICD-10-CM

## 2012-10-29 DIAGNOSIS — R5383 Other fatigue: Secondary | ICD-10-CM

## 2012-10-29 DIAGNOSIS — R5381 Other malaise: Secondary | ICD-10-CM

## 2012-10-29 LAB — POCT URINALYSIS DIPSTICK
Glucose, UA: NEGATIVE
Ketones, UA: 40
Spec Grav, UA: >=1.03
Urobilinogen, UA: 0.2

## 2012-10-29 MED ORDER — FREESTYLE SYSTEM KIT: 1.0000 | PACK | Status: DC | PRN

## 2012-10-29 MED ORDER — GLUCOSE BLOOD VI STRP: ORAL_STRIP | Status: DC

## 2012-10-29 MED ORDER — METFORMIN HCL 500 MG PO TABS: 1000.0000 mg | ORAL_TABLET | Freq: Two times a day (BID) | ORAL | Status: DC

## 2012-10-29 NOTE — Patient Instructions (Addendum)
Follow up with me in 1 week  Check your sugars once a day and bring back a log.  Start with metformin 500mg  twice a day and increase to 1000mg  twice a day once you tolerate it.

## 2012-10-29 NOTE — Progress Notes (Signed)
Patient ID: [name] [name]    DOB: May 12, 1975, 37 y.o.   MRN: [id number] --- Subjective:  [name] [name] is a 37 y.o.female with h/o bipolar 1, obesity, hyperlipidemia, obstructive sleep apnea who presents with 2 week history of nausea, abdominal pain, polyuria and polydyspia.  - Abdominal pain started 2 weeks ago, located in left upper quadrant, unrelated to eating, sharp and burning pain, lasting 10-32minutes a time, occuring every day. Has also had nausea for the last 2 weeks, not necessarily associated with abdominal pain. No vomiting. Appetite has decreased as well. She has lost 12 lbs since May.  She also reports increased urinary frequency and polydypsia with feeling that her mouth is always dry. She has been fatigued throughout the day, despite wearing her CPAP machine every night. She has been taking her medications as prescribed without any new changes. She was seen in May and had an A1C of 7.3 at the time. Diet and exercise were emphasized and metformin was prescribed. She denies ever receiving the prescription.   ROS: see HPI Past Medical History: reviewed and updated medications and allergies. Social History: Tobacco: none  Objective: Filed Vitals:   10/29/12 1409  BP: 136/85  Pulse: 83  Temp: 98 F (36.7 C)    Physical Examination:   General appearance - alert, well appearing, tearful when talking about the new diagnosis of diabetes.  Mouth - dry mucous membranes Neck - supple, no significant adenopathy, normal thyroid Chest - clear to auscultation, no wheezes, rales or rhonchi, symmetric air entry Heart - normal rate, regular rhythm, normal S1, S2, no murmurs Abdomen - soft, non distended, tender in left upper quadrant, no rebound, no guarding Extremities - no pedal edema, normal sensation to light touch in lower extremities bilaterally

## 2012-10-30 ENCOUNTER — Telehealth: Payer: Self-pay | Admitting: *Deleted

## 2012-10-30 LAB — COMPREHENSIVE METABOLIC PANEL
Albumin: 4.3 g/dL (ref 3.5–5.2)
Alkaline Phosphatase: 102 U/L (ref 39–117)
CO2: 28 mEq/L (ref 19–32)
Calcium: 9.6 mg/dL (ref 8.4–10.5)
Chloride: 101 mEq/L (ref 96–112)
Glucose, Bld: 219 mg/dL — ABNORMAL HIGH (ref 70–99)
Potassium: 3.8 mEq/L (ref 3.5–5.3)
Sodium: 139 mEq/L (ref 135–145)
Total Protein: 7.4 g/dL (ref 6.0–8.3)

## 2012-10-30 LAB — VITAMIN D 25 HYDROXY (VIT D DEFICIENCY, FRACTURES): Vit D, 25-Hydroxy: 31 ng/mL (ref 30–89)

## 2012-10-30 NOTE — Telephone Encounter (Signed)
Patient calling due to CBG this morning 350.  Recently dx'd with DM yesterday.  Started on Metformin 500 mg BID.  Out of test strips because she tried multiple times to check CBG today and was unable to get enough blood for CBG.  Also, c/o one episode of diarrhea this afternoon possibly due to med.  Patient has follow-up appt with Dr. Gwenlyn Saran on 11/08/12.  Will route note to Dr. Gwenlyn Saran for advice and call patient back.  Gaylene Brooks, RN

## 2012-10-30 NOTE — Telephone Encounter (Signed)
Discussed info with Dr. Gwenlyn Saran.  Will have patient come in to see Paulino Rily, PharmD for diabetic teaching due to recently dx'd with DM.  Appt scheduled for tomorrow at 1:30 pm.  Informed patient of lab results per Dr. Tracey Harries except glucose elevated.  Patient also informed to keep f/u appt with Dr. Gwenlyn Saran on 11/08/12.   Gaylene Brooks, RN

## 2012-10-31 ENCOUNTER — Ambulatory Visit (INDEPENDENT_AMBULATORY_CARE_PROVIDER_SITE_OTHER): Payer: Self-pay | Admitting: Pharmacist

## 2012-10-31 ENCOUNTER — Encounter: Payer: Self-pay | Admitting: Pharmacist

## 2012-10-31 DIAGNOSIS — E119 Type 2 diabetes mellitus without complications: Secondary | ICD-10-CM

## 2012-10-31 DIAGNOSIS — R1012 Left upper quadrant pain: Secondary | ICD-10-CM | POA: Insufficient documentation

## 2012-10-31 MED ORDER — INSULIN GLARGINE 100 UNIT/ML ~~LOC~~ SOLN: 10.0000 [IU] | Freq: Every day | SUBCUTANEOUS | Status: DC

## 2012-10-31 NOTE — Assessment & Plan Note (Signed)
Differential: gastritis vs PUD vs GERD vs diabetic gastroparesis Will treat diabetes acutely for now. Follow up in 1 week. If worst abdominal pain, will check for H pylori and start empiric PPI.

## 2012-10-31 NOTE — Progress Notes (Signed)
S:    Patient arrives in good spirits walking without help from others. She presents to the clinic for diabetes education. She was diagnosed on 10/29/12. Patient has been taking metformin 500 mg twice a day for 2 days and reports "terrible diarrhea.   A/P: Diabetes diagnosed on 10/29/12 Lab Results  Component Value Date   HGBA1C 10.0 10/29/2012  AND home fasting CBG readings of 350 and 422, and 2 hour post-prandial/random CBG readings of 380-500. Educated patient on hypoglycemia management. Reports difficulty adhering to metformin, due to GI disturbances. Instructed patient to hold metformin until GI upset is gone, and attempt to restart metformin XR 500 mg once daily.  Started basal insulin Lantus (insulin glargine) 10 units every morning. Patient will continue to titrate 2 units/day if fasting CBGs > 100mg /dl until fasting CBGs reach goal or next visit. Patient educated on proper administration of Lantus. Patient gave herself 10 units of Lantus in clinic today. Patient instructed to continue taking blood sugar every morning. Samples were provided. Refer patient to our health educator for diet counseling.  Written patient instructions provided.  Follow up in Pharmacist Clinic Visit in 2 weeks.   Total time in face to face counseling 45 minutes.  Patient seen with Georga Kaufmann, PharmD Resident.

## 2012-10-31 NOTE — Assessment & Plan Note (Signed)
Diabetes diagnosed on 10/29/12 Lab Results  Component Value Date   HGBA1C 10.0 10/29/2012  AND home fasting CBG readings of 350 and 422, and 2 hour post-prandial/random CBG readings of 380-500. Educated patient on hypoglycemia management. Reports difficulty adhering to metformin, due to GI disturbances. Instructed patient to hold metformin until GI upset is gone, and attempt to restart metformin XR 500 mg once daily.  Started basal insulin Lantus (insulin glargine) 10 units every morning. Patient will continue to titrate 2 units/day if fasting CBGs > 100mg /dl until fasting CBGs reach goal or next visit. Patient educated on proper administration of Lantus. Patient gave herself 10 units of Lantus in clinic today. Patient instructed to continue taking blood sugar every morning. Samples were provided. Refer patient to our health educator for diet counseling.  Written patient instructions provided.  Follow up in Pharmacist Clinic Visit in 2 weeks.   Total time in face to face counseling 45 minutes.  Patient seen with Georga Kaufmann, PharmD Resident.

## 2012-10-31 NOTE — Assessment & Plan Note (Addendum)
New diagnosis for patient today as she was unaware that she had diabetes prior to now.  A1C:10. Likely explanation for her general malaise symptoms, including polydypsia, nausea, polyuria and fatigue.  CBG in clinic of 196 fasting which is reassuring.  - will obtain CMP  - TSH - urinalysis showing no glucose, negative ketones, 100 protein. She will need ACEI therapy but given new onset diabetes and traumatic diagnosis for patient, will hold on adding too many medicines at once.  - start metformin with target dose of 1000mg  bid. Start at 500mg  bid and incerase in 3 days if tolerating medicine.  - will hold on starting glipizide for now until she becomes used to metformin. Will consider adding it at her follow up in 1 week. Will try holding on insulin therapy for now, although she might need lantus to stabilize her glucose while oral medicine takes action.  - patient to check CBG's.  - follow up in 1 week with me.

## 2012-10-31 NOTE — Assessment & Plan Note (Signed)
Likely from diabetes. Check cbc, TSH, vitamin D.

## 2012-10-31 NOTE — Patient Instructions (Addendum)
Thank you for coming in today!  Decrease your metformin to 500 mg (1 pill) every day, starting when your stomach feels better.  Start Lantus 10 units every morning. Increase your dose by 2 units every day your blood sugar is over 100.   Try to watch the number of carbs you eat every meal (meals 45 grams, snacks 15 grams).   Follow up with Pharmacist clinic in 2 weeks.

## 2012-11-01 NOTE — Progress Notes (Signed)
Scheduled ov for diabetes education on 11/07/12.

## 2012-11-02 ENCOUNTER — Telehealth: Payer: Self-pay | Admitting: Family Medicine

## 2012-11-02 NOTE — Telephone Encounter (Signed)
[facility] After Hours Line.    Newly diagnosed diabetic. CBG after dinner unreadable and typically about 500. Patient feels fine except for polyuria. Denies headache, blurry vision, weakness, etc. CBG 350 this morning. Took 12 units lantus.    Had been titrating up 2 units every day until CBG near 100. I advised patient of the following: 1. Give extra 5 units lantus now (rounded off daily dose to 16 units) 2. If CBG between 120-150 increase 1 unit tomorrow from new baseline of 16 units (so 17 units) 3. If CBG >150, increase to 18 units tomorrow (up by 2).  4. Increase subsequent days of 2 units per day until gets to 150 fasting then increase by 1 unit until she gets to 100.   Advised patient to call with ANY questions and to keep f/u on Friday.  Patient aware of symptoms to look out for regarding hypoglycemia and appropriate treatment.   [name] [name]. Marti Sleigh, MD, PGY3 11/02/2012 8:55 PM

## 2012-11-03 ENCOUNTER — Telehealth: Payer: Self-pay | Admitting: Family Medicine

## 2012-11-03 NOTE — Telephone Encounter (Signed)
Paged to pt's home phone via emergency line, complaining of high blood sugars and feeling poorly. Pt called emergency line yesterday with very high blood sugars and similar complaints; see note from Dr. Durene Cal 8/30. Pt feels very poorly today, nauseated, blurred vision (not as bad as it has been). Blood sugar currently 317. Pt currently on Lantus only, took 16 units this morning (blood sugar was >400 this AM). Pt states she urinated "a lot yesterday," but not as much today; states she has not been drinking as much water today due to "feeling bad." Advised pt to take an extra 4 units of Lantus now, to eat a small meal, and to stay well-hydrated. Advised pt to see how she feels this afternoon/evening, and if she is not feeling better (or feeling worse) by suppertime or tonight, that she should come into the ED to be evaluated. Otherwise advised going to urgent care tomorrow, or coming to the clinic on Tuesday as a walk-in (pt has f/u scheduled for this coming Friday, 9/5). Pt voiced understanding and is aware of hypoglycemic signs/symptoms to watch for.  [name] Coup Marja Adderley, MD 11/03/2012, 2:34 PM

## 2012-11-04 ENCOUNTER — Telehealth: Payer: Self-pay | Admitting: Family Medicine

## 2012-11-04 NOTE — Telephone Encounter (Signed)
Paged to pt's home phone via emergency line, complaining of high blood sugars and feeling poorly. Patient is a newly found diabetic (last week). Pt called emergency line yesterday and the day prior, with very high blood sugars and similar complaints; see note from Dr. Durene Cal 8/30 and Dr. Casper Harrison 8/31.  Blood sugar last night after 4 units extra was given (total of 20 units total for day) was 288. She reports she woke up this morning with 408. Still feeling the same as prior with mild nausea, no vomit.  Pt currently on Lantus only, due to metformin making her ill feeling. Patient reports she is urinating frequently and drinking plenty of water.  Advised pt to take an extra 2 units of Lantus now, to eat frequent small meals, and to stay well-hydrated. Discussed in detail the importance of healthy meals, carb counting and staying hydrated. Advised patient if she is not feeling better (or feeling worse) by  tonight, that she should come into the ED to be evaluated. Otherwise advised her to come to the clinic on Tuesday as a walk-in (pt has f/u scheduled for this coming Friday, 9/5). Pt voiced understanding and is aware of hypoglycemic signs/symptoms to watch for. Trevel Dillenbeck DO

## 2012-11-05 ENCOUNTER — Telehealth: Payer: Self-pay | Admitting: Pharmacist

## 2012-11-05 NOTE — Progress Notes (Signed)
Patient ID: [name] [name], female   DOB: December 28, 1975, 37 y.o.   MRN: [id number] Reviewed: Agree with Dr. Macky Lower documentation and management.

## 2012-11-05 NOTE — Telephone Encounter (Signed)
Phone call placed to patient to follow up with CBGs after emergency line calls over the weekend. Patient reports she has begun metformin. Her current dose of Lantus is 20 units in the morning. Her fasting blood glucose was 276 this morning. Recommended if fasting BG is >200 tomorrow morning increase to the Lantus dose by 5 units.  In the following days, increase in increments of 2 units daily for fasting blood glucose readings >100.

## 2012-11-07 ENCOUNTER — Ambulatory Visit (INDEPENDENT_AMBULATORY_CARE_PROVIDER_SITE_OTHER): Payer: Self-pay | Admitting: Home Health Services

## 2012-11-07 VITALS — Wt 323.3 lb

## 2012-11-07 DIAGNOSIS — E119 Type 2 diabetes mellitus without complications: Secondary | ICD-10-CM

## 2012-11-08 ENCOUNTER — Encounter: Payer: Self-pay | Admitting: Home Health Services

## 2012-11-08 ENCOUNTER — Ambulatory Visit: Payer: Self-pay | Admitting: Family Medicine

## 2012-11-08 NOTE — Progress Notes (Signed)
Patient Identified Concern:  DM control Stage of Change Patient Is In:  Contemplation- planning on making changes over the net 6 months. Patient Reported Barriers:  Habits, lack of time to make healthy foods, children like certain foods, needs more diabetic related recipes.  Patient Reported Perceived Benefits:  Controlling DM, feeling better Patient Reports Self-Efficacy:   Pt displays some self-efficacy based on past successes Behavior Change Supports:  Children are supportive however family is unaware that she has diagnosis of DM at this point.   Goals:  To create meal plan over next 2 weeks that includes DM recipes.  Patient Education:  We talked about behaviors that lead to DM control.  Pt is fairly well aware of carb counting, eating regularly, and exercise.  Pt would like to first gain control of her diet and then slowly add in exercise.  Pt has also recently started giving her self insulin and has had success with taking it along with checking glucose daily.   Pt will follow up with health coach in 1-2 weeks.

## 2012-11-14 ENCOUNTER — Ambulatory Visit: Payer: Self-pay | Admitting: Pharmacist

## 2012-11-15 ENCOUNTER — Ambulatory Visit: Payer: Self-pay | Admitting: Family Medicine

## 2012-11-15 ENCOUNTER — Ambulatory Visit: Payer: Self-pay | Admitting: Pharmacist

## 2012-11-18 ENCOUNTER — Ambulatory Visit: Payer: Self-pay | Admitting: Family Medicine

## 2012-12-05 ENCOUNTER — Ambulatory Visit (INDEPENDENT_AMBULATORY_CARE_PROVIDER_SITE_OTHER): Payer: Self-pay | Admitting: Pharmacist

## 2012-12-05 ENCOUNTER — Encounter: Payer: Self-pay | Admitting: Pharmacist

## 2012-12-05 VITALS — BP 117/84 | HR 81 | Ht 68.5 in | Wt 327.9 lb

## 2012-12-05 DIAGNOSIS — E119 Type 2 diabetes mellitus without complications: Secondary | ICD-10-CM

## 2012-12-05 MED ORDER — LIRAGLUTIDE 18 MG/3ML ~~LOC~~ SOPN: 0.6000 mg | PEN_INJECTOR | Freq: Every day | SUBCUTANEOUS | Status: DC

## 2012-12-05 MED ORDER — INSULIN GLARGINE 100 UNIT/ML ~~LOC~~ SOLN: 10.0000 [IU] | Freq: Every day | SUBCUTANEOUS | Status: DC

## 2012-12-05 NOTE — Progress Notes (Signed)
S:    Patient arrives in a slightly depressed mood.    Presents for diabetes follow up.  Patient reports having history of Diabetes since August of this year.  Patient has taken Lantus and metformin since diagnosis. She recently ran out of the Lantus and was only taking metformin.   O:  . Lab Results  Component Value Date   HGBA1C 10.0 10/29/2012     home CBG readings range from mid-80s to 190s.    A/P: Diabetes diagnosed in 10/2012, currently uncontrolled with fluctuating BG readings. Denies hypoglycemic events and is able to verbalize appropriate hypoglycemia management plan.  Reports adherence with medication. She ran out of her Lantus 4-5 days ago and was instructed to take metformin 1000 mg BID--patient reports that she takes metformin 500 mg qAM and 1000 mg qPM. She is currently very busy with work and demonstrates a lack of interest in implementing an exercise regimen but instead wants to focus on her diet. She was encouraged to continue with the diet changes previously made.   Control is suboptimal due to lack of Lantus. BG readings are variable, indicating the need for tighter BG control throughout the day. Lantus vial provided to patient with instructions to take 10 units daily in the morning. Metformin prescription changed to Metformin XR 750 mg BID. Victoza 0.6 mg daily initiated with intent to titrate up to 1.8 mg daily. Lantus and Victoza called in to [facility] at [facility].   Patient had a poor affect and was instructed to discuss her mood with her PCP.    Written patient instructions provided.  Follow up in Pharmacist Clinic Visit when patient has received Victoza pen to educate on medication administration.   Total time in face to face counseling 45 minutes.  Patient seen with Jobe Gibbon, PharmD Candidate,  Piedad Climes, PharmD Resident, and Georga Kaufmann, PharmD Resident.

## 2012-12-05 NOTE — Patient Instructions (Addendum)
Continue to work on [employer].  Start taking Metformin XR (Glucophage XR) 750 mg twice daily.  Start taking Lantus 10 units once daily.   Fill out paperwork for [facility] and pick up prescription for Victoza.  When you get the medication make an appointment to see [place] for education on how to use.  Make appointment to see Dr. Gwenlyn Saran for fatigue and numbness in foot.

## 2012-12-05 NOTE — Assessment & Plan Note (Signed)
Diabetes diagnosed in 10/2012, currently uncontrolled with fluctuating BG readings. Denies hypoglycemic events and is able to verbalize appropriate hypoglycemia management plan.  Reports adherence with medication. She ran out of her Lantus 4-5 days ago and was instructed to take metformin 1000 mg BID--patient reports that she takes metformin 500 mg qAM and 1000 mg qPM. She is currently very busy with work and demonstrates a lack of interest in implementing an exercise regimen but instead wants to focus on her diet. She was encouraged to continue with the diet changes previously made.   Control is suboptimal due to lack of Lantus. BG readings are variable, indicating the need for tighter BG control throughout the day. Lantus vial provided to patient with instructions to take 10 units daily in the morning. Metformin prescription changed to Metformin XR 750 mg BID. Victoza 0.6 mg daily initiated with intent to titrate up to 1.8 mg daily. Lantus and Victoza called in to [facility] at [facility].   Patient had a poor affect and was instructed to discuss her mood with her PCP.    Written patient instructions provided.  Follow up in Pharmacist Clinic Visit when patient has received Victoza pen to educate on medication administration.   Total time in face to face counseling 45 minutes.  Patient seen with Jobe Gibbon, PharmD Candidate,  Piedad Climes, PharmD Resident, and Georga Kaufmann, PharmD Resident.

## 2012-12-06 MED ORDER — METFORMIN HCL ER 500 MG PO TB24: ORAL_TABLET | ORAL | Status: DC

## 2012-12-06 NOTE — Addendum Note (Signed)
Addended by: Kathrin Ruddy on: 12/06/2012 02:58 PM   Modules accepted: Orders, Medications

## 2012-12-06 NOTE — Progress Notes (Addendum)
Patient ID: [name] [name], female   DOB: 07-18-1975, 37 y.o.   MRN: [id number] Reviewed: Agree with Dr. Macky Lower documentation and management.  Patient contacted and informed we will need to use 500mg  XR formulation as the 750XR is NOT covered by [facility].   New Rx for XR formulation  500mg  QAM and 1000mg  QPM called into [facility].

## 2012-12-19 ENCOUNTER — Telehealth: Payer: Self-pay | Admitting: Home Health Services

## 2012-12-19 NOTE — Telephone Encounter (Signed)
Spoke with [name] [name].  Pt reports struggling with meal planning and eating dinner.  Pt reports having had a pizza craving for the past few weeks.  Pt said she was able to initial create DM friendly meals but then fell off the wagon.  Pt recommitted with the following goals: restart drinking protein shakes in the morning, meal planning for dinner, and to focus on drinking water.   Will follow up with health coach in 2 weeks.  Pt's overall goal is increase knowledge of DM and for DM management.

## 2013-01-09 ENCOUNTER — Other Ambulatory Visit: Payer: Self-pay

## 2013-02-10 ENCOUNTER — Telehealth: Payer: Self-pay | Admitting: Pharmacist

## 2013-02-10 NOTE — Telephone Encounter (Signed)
Please call Ms. Radabaugh back regarding new medication she's taking.  Need to discuss asap

## 2013-02-13 ENCOUNTER — Ambulatory Visit (INDEPENDENT_AMBULATORY_CARE_PROVIDER_SITE_OTHER): Payer: Self-pay | Admitting: Pharmacist

## 2013-02-13 DIAGNOSIS — E119 Type 2 diabetes mellitus without complications: Secondary | ICD-10-CM

## 2013-02-13 LAB — POCT GLYCOSYLATED HEMOGLOBIN (HGB A1C): Hemoglobin A1C: 6.8

## 2013-02-13 NOTE — Patient Instructions (Signed)
Victoza - Stop and PLACE in storage.   Thanks for setting a weight goal of 320 lbs by March 1st.   Next visit with Dr. Gwenlyn Saran

## 2013-02-14 ENCOUNTER — Encounter: Payer: Self-pay | Admitting: Pharmacist

## 2013-02-14 MED ORDER — INSULIN GLARGINE 100 UNIT/ML ~~LOC~~ SOLN: 10.0000 [IU] | Freq: Every day | SUBCUTANEOUS | Status: DC

## 2013-02-14 NOTE — Progress Notes (Signed)
S:    Patient arrives in good spirits.    Presents for diabetes follow up after starting lantus AND considering use of Vicotza.   She stats she did attempt to utilize Victoza once day however she is fearful of side effects including thyroid dysfunction AND kidney problems as they run in her family.  Her how blood glucose readings were provided in he meter and appear to be excellent.   Patient has taken metformin 500mg  AM nd 1000mg  PM in combination with Lantus 10 units once daily since last visit.     O:  Lab Results  Component Value Date   HGBA1C 6.8 02/13/2013    home fasting CBG readings of <140 with no "LOW readings" 2 hour post-prandial/random CBG readings of also appear to be at goal with no readings > 145.  A/P: Diabetes currently significantly improved with metformin 1500mg  daily AND 10 units of Lantus daily.  Unwilling to consider continuing Victoza at this time.  Asked patient to store this medication just to allow [place] the possibility of use in the future.  She was willing to set a weight loss goal for March first after significant encouragement.  Denies hypoglycemic events and is able to verbalize appropriate hypoglycemia management plan.  Reports adherence with medication. Patient is meeting goals for blood glucose control at this time. No change as patient is doing very well with BG control.  Written patient instructions provided.  Follow up with Dr. Gwenlyn Saran for next visit AND subsequently with Pharmacist Clinic Visit PRN.   Total time in face to face counseling 40 minutes.

## 2013-02-14 NOTE — Assessment & Plan Note (Signed)
Diabetes currently significantly improved with metformin 1500mg  daily AND 10 units of Lantus daily.  Unwilling to consider continuing Victoza at this time.  Asked patient to store this medication just to allow [place] the possibility of use in the future.  She was willing to set a weight loss goal for March first after significant encouragement.  Denies hypoglycemic events and is able to verbalize appropriate hypoglycemia management plan.  Reports adherence with medication. Patient is meeting goals for blood glucose control at this time. No change as patient is doing very well with BG control.  Written patient instructions provided.  Follow up with Dr. Gwenlyn Saran for next visit AND subsequently with Pharmacist Clinic Visit PRN.   Total time in face to face counseling 40 minutes.

## 2013-02-14 NOTE — Progress Notes (Signed)
Patient ID: [name] [name], female   DOB: 09/06/1975, 37 y.o.   MRN: [id number] Reviewed: Agree with Dr. Koval's documentation and management. 

## 2013-02-14 NOTE — Addendum Note (Signed)
Addended by: Kathrin Ruddy on: 02/14/2013 10:22 AM   Modules accepted: Orders

## 2013-02-24 ENCOUNTER — Telehealth: Payer: Self-pay | Admitting: Family Medicine

## 2013-02-24 NOTE — Telephone Encounter (Signed)
Pt called and said that the [facility] program did not receive a refill request for her lantus. Can we resend this. jw

## 2013-02-24 NOTE — Telephone Encounter (Signed)
Will fwd. To PCP to address.

## 2013-02-25 MED ORDER — INSULIN GLARGINE 100 UNIT/ML ~~LOC~~ SOLN: 10.0000 [IU] | Freq: Every day | SUBCUTANEOUS | Status: DC

## 2013-02-25 NOTE — Telephone Encounter (Signed)
Did not find fax number to send Rx and therefore called the [facility] program and left a voice message with a verbal prescription order for lantus.   Marena Chancy, PGY-3 Family Medicine Resident

## 2013-02-25 NOTE — Telephone Encounter (Signed)
Printed Rx for lantus and am [facility]   Marena Chancy, PGY-3 Family Medicine Resident

## 2013-03-11 ENCOUNTER — Telehealth: Payer: Self-pay | Admitting: Family Medicine

## 2013-03-11 NOTE — Telephone Encounter (Signed)
Called patient back to check on her symptoms.  She states that she has been having abdominal pain for the last 1-2 months but recently paid more attention to it. Pain is sharp, achy and associated with it she eels an irregular heart beat. She was started on metformin and lantus 3 months ago. No other change in meds.  I recommended that she be evaluated at the office for this.  She also tells me that she would prefer to get pens for her lantus at her next refill.   Marena ChancyStephanie Nawaf Strange, PGY-3 Family Medicine Resident

## 2013-03-11 NOTE — Telephone Encounter (Signed)
Patient recently dx with diabetes and has been having abd pain since beginning meds. She would like to discuss symptoms with Dr. Raymondo BandKoval or Dr Gwenlyn SaranLosq. Also, patient was suppose to be prescribed Insulin pen but was prescribed insulin vial.

## 2013-03-18 ENCOUNTER — Encounter: Payer: Self-pay | Admitting: Family Medicine

## 2013-03-21 ENCOUNTER — Ambulatory Visit: Payer: Self-pay | Admitting: Family Medicine

## 2013-04-04 ENCOUNTER — Telehealth: Payer: Self-pay | Admitting: Emergency Medicine

## 2013-04-04 ENCOUNTER — Other Ambulatory Visit: Payer: Self-pay | Admitting: Family Medicine

## 2013-04-04 MED ORDER — INSULIN GLARGINE 100 UNIT/ML SOLOSTAR PEN: 10.0000 [IU] | PEN_INJECTOR | Freq: Every day | SUBCUTANEOUS | Status: DC

## 2013-04-04 NOTE — Telephone Encounter (Signed)
[name] from [facility] called Wants to know if we can change pt's Lantus vile to the Lantus Pen Please call [phone] w/new Rx and SIG.

## 2013-04-04 NOTE — Progress Notes (Signed)
Called in Rx for lantus solostar pen to the [facility] program. lantus 10 units daily 15 mls, 11 refills.

## 2013-04-04 NOTE — Telephone Encounter (Signed)
Will fwd. To PCP to address. .Alfonza Toft  

## 2013-04-21 ENCOUNTER — Ambulatory Visit: Payer: Self-pay

## 2013-06-22 ENCOUNTER — Emergency Department (HOSPITAL_COMMUNITY)
Admission: EM | Admit: 2013-06-22 | Discharge: 2013-06-22 | Disposition: A | Discharge status: Home / Self Care | Payer: Self-pay | Attending: Emergency Medicine | Admitting: Emergency Medicine

## 2013-06-22 ENCOUNTER — Encounter (HOSPITAL_COMMUNITY): Payer: Self-pay | Admitting: Emergency Medicine

## 2013-06-22 DIAGNOSIS — IMO0002 Reserved for concepts with insufficient information to code with codable children: Secondary | ICD-10-CM | POA: Insufficient documentation

## 2013-06-22 DIAGNOSIS — Z79899 Other long term (current) drug therapy: Secondary | ICD-10-CM | POA: Insufficient documentation

## 2013-06-22 DIAGNOSIS — Y929 Unspecified place or not applicable: Secondary | ICD-10-CM | POA: Insufficient documentation

## 2013-06-22 DIAGNOSIS — X58XXXA Exposure to other specified factors, initial encounter: Secondary | ICD-10-CM | POA: Insufficient documentation

## 2013-06-22 DIAGNOSIS — S76011A Strain of muscle, fascia and tendon of right hip, initial encounter: Secondary | ICD-10-CM

## 2013-06-22 DIAGNOSIS — Z794 Long term (current) use of insulin: Secondary | ICD-10-CM | POA: Insufficient documentation

## 2013-06-22 DIAGNOSIS — D649 Anemia, unspecified: Secondary | ICD-10-CM | POA: Insufficient documentation

## 2013-06-22 DIAGNOSIS — F319 Bipolar disorder, unspecified: Secondary | ICD-10-CM | POA: Insufficient documentation

## 2013-06-22 DIAGNOSIS — E119 Type 2 diabetes mellitus without complications: Secondary | ICD-10-CM | POA: Insufficient documentation

## 2013-06-22 DIAGNOSIS — Y939 Activity, unspecified: Secondary | ICD-10-CM | POA: Insufficient documentation

## 2013-06-22 DIAGNOSIS — M79609 Pain in unspecified limb: Secondary | ICD-10-CM

## 2013-06-22 HISTORY — DX: Type 2 diabetes mellitus without complications: E11.9

## 2013-06-22 MED ORDER — METHOCARBAMOL 500 MG PO TABS: 500.0000 mg | ORAL_TABLET | Freq: Two times a day (BID) | ORAL | Status: DC

## 2013-06-22 MED ORDER — MELOXICAM 15 MG PO TABS: 15.0000 mg | ORAL_TABLET | Freq: Every day | ORAL | Status: DC

## 2013-06-22 MED ORDER — KETOROLAC TROMETHAMINE 60 MG/2ML IM SOLN
60.0000 mg | Freq: Once | INTRAMUSCULAR | Status: AC
  Administered 2013-06-22: 60 mg via INTRAMUSCULAR
  Filled 2013-06-22: qty 2

## 2013-06-22 NOTE — ED Provider Notes (Signed)
CSN: [id number]     Arrival date & time 06/22/13  1718 History  This chart was scribed for non-physician practitioner working with Blanchie Dessert, MD by Mercy Moore, ED Scribe. This patient was seen in room TR07C/TR07C and the patient's care was started at 5:53 PM.   Chief Complaint  Patient presents with  . Leg Pain      The history is provided by the patient. No language interpreter was used.   HPI Comments: [name] [name] is a 38 y.o. female who presents to the Emergency Department complaining of right groin pain that radiates down the leg. Patient the pain is located at her right groin until she attempts to walk and she feels shooting pain that radiates down her right leg. Patient reports pain with every step and she says it feels like her leg is giving out on her. Pain is exacerbated with movement, ambulation, and bearing weight on the leg. Patient reports that recently walking has become difficult with both legs, but she only feels the shooting pain in her right leg. Patient has attempted treating her pain with ibuprofen today, with no relief. Patient shares that she should not take ibuprofen because she is diabetic. Patient suspects that she may have injured the leg but is unsure of when and how. 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    Patient reports similar pain when injuring her groin about a month ago, but the pain resolved on its own. Patient says that she did not see a doctor.    Past Medical History  Diagnosis Date  . Allergy   . Bipolar 1 disorder     Sees Dr. Pablo Ledger, psychology,  [phone]  . Fatigue   . Anemia   . Diabetes mellitus without complication    Past Surgical History  Procedure Laterality Date  . Abdominal hysterectomy     No family history on file. History  Substance Use Topics  . Smoking status: Never Smoker   . Smokeless tobacco: Never Used  . Alcohol Use: No   OB  History   Grav Para Term Preterm Abortions TAB SAB Ect Mult Living                 Review of Systems  Musculoskeletal:       Groin pain and left leg pain.      Allergies  Hydromorphone hcl; Meperidine hcl; Morphine; Lisinopril; Sumatriptan; and Zolmitriptan  Home Medications   Prior to Admission medications   Medication Sig Start Date End Date Taking? Authorizing Provider  Calcium Carbonate-Vitamin D (CALCIUM 600/VITAMIN D) 600-400 MG-UNIT per chew tablet Chew 1 tablet by mouth 2 (two) times daily.      Historical Provider, MD  ferrous sulfate (ECK FERROUS SULFATE) 325 (65 FE) MG tablet Take 325 mg by mouth 2 (two) times daily.      Historical Provider, MD  glucose blood test strip Use as instructed 10/29/12   Kandis Nab, MD  glucose monitoring kit (FREESTYLE) monitoring kit 1 each by Does not apply route as needed for other. 10/29/12   Kandis Nab, MD  Insulin Glargine (LANTUS) 100 UNIT/ML Solostar Pen Inject 10 Units into the skin daily at 10 pm. 04/04/13   Kandis Nab, MD  lamoTRIgine (LAMICTAL) 100 MG tablet Take 2 tablets (200 mg total) by mouth daily. 07/11/12   Vivi Ferns, MD  Liraglutide 18 MG/3ML SOPN Inject 0.6-1.8 mg into  the skin daily. 12/05/12   Zigmund Gottron, MD  metFORMIN (GLUCOPHAGE-XR) 500 MG 24 hr tablet Take one tablet in the AM AND two tablets in the PM prior to meals. 12/06/12   Zigmund Gottron, MD  Multiple Vitamin (MULTIVITAMIN) tablet Take 1 tablet by mouth daily.    Historical Provider, MD  Venlafaxine HCl 75 MG TB24 Take 1 tablet (75 mg total) by mouth daily. 07/11/12   Vivi Ferns, MD   Triage Vitals: BP 118/56  Pulse 84  Temp(Src) 98.1 F (36.7 C) (Oral)  Resp 18  SpO2 100% Physical Exam  Nursing note and vitals reviewed. Constitutional: She is oriented to person, place, and time. She appears well-developed and well-nourished. No distress.  HENT:  Head: Normocephalic and atraumatic.  Eyes: EOM are normal.  Neck: Neck  supple. No tracheal deviation present.  Cardiovascular: Normal rate.   Dorsal pedal pulses intact bilaterally  Pulmonary/Chest: Effort normal. No respiratory distress.  Musculoskeletal: Normal range of motion.  Tenderness to palpation over right anterior medial thigh, and groin. There is no bruising, swelling, erythema. Pain with active hip flexion and abduction. Tenderness to posterior right knee. No calf tenderness, negative Homans sign.  Neurological: She is alert and oriented to person, place, and time.  Skin: Skin is warm and dry.  Psychiatric: She has a normal mood and affect. Her behavior is normal.    ED Course  Procedures (including critical care time) DIAGNOSTIC STUDIES:     COORDINATION OF CARE: 5:59 PM- Will order an ultrasound of leg to make sure patient does not have blood clot. Discussed treatment plan with patient at bedside and patient agreed to plan.     Labs Review Labs Reviewed - No data to display  Imaging Review No results found.   EKG Interpretation None      MDM   Final diagnoses:  Strain of flexor muscle of right hip   Patient with right hip flexor tenderness and pain. Patient drove herself here, unable to give her any pain medication other than Toradol. Patient states Toradol did help some. I did get a venous Doppler to rule out DVT given the distribution of her pain and pain with walking. There is no injury, doubt any bony abnormality. Will discharge her home with pain medications, muscle relaxant. Large Ace wrap given over that side for support. Patient instructed to follow with a primary care Dr. for recheck. At this time no signs of infection, no DVT, doubt any bony abnormalities.   Filed Vitals:   06/22/13 1726 06/22/13 1936  BP: 118/56 138/82  Pulse: 84 89  Temp: 98.1 F (36.7 C) 98.2 F (36.8 C)  TempSrc: Oral Oral  Resp: 18 18  SpO2: 100% 99%     I personally performed the services described in this documentation, which was  scribed in my presence. The recorded information has been reviewed and is accurate.    Renold Genta, PA-C 06/24/13 [phone]

## 2013-06-22 NOTE — ED Notes (Signed)
Pt refused ace wrap and crutches, states she has some at home

## 2013-06-22 NOTE — Discharge Instructions (Signed)
No evidence of blood clots in right leg. At rest or leg. Ice and heat, alternating the two.  Take MOBIC for pain and inflammation. Robaxin for muscle spasms. Ace wrap for compression. Followup with your Dr. if not improving.    Adductor Muscle Strain with Rehab  The adductor muscles of the thigh are responsible for moving the leg across the body and are susceptible to muscle strains. A strain is an injury to a muscle or a tendon that attaches the muscle to a bone. Strains of the adductor muscles occur where the muscle tendons attach to the pelvic bone. A muscle strain may be a complete or partial tear of the muscle and may involve one or more of the adductor muscles. These strains are usually classified as a grade 1 or 2 strain. A grade 1 strain has no obvious sign of tearing or stretching of the muscle or tendon, but may include significant inflammation. A grade 2 strain is a moderate strain in which the muscle or tendon has been partially torn and has been stretched. Grade 2 strains are usually accompanied with loss of strength. A grade 3 muscle strain rarely occurs in the adductor muscles. A grade 3 strain is a complete tear of the muscle or tendon. SYMPTOMS   Occasionally there is a sudden "pop" felt or heard in the groin or inner thigh at the time of injury.  There may be pain, tenderness, swelling, warmth, or redness over the inner thigh and groin. This may be worsened by moving the hip (especially when spreading the legs or hips, pushing the legs against each other or kicking with the affected leg). There may be bruising (contusion) in the groin and inner thigh within 48 hours following the injury.  There may be loss of fullness of the muscle with complete rupture (uncommon).  Muscle spasm in the groin and inner thigh can occur. CAUSES   Prolonged overuse or a sudden increase in intensity, frequency, or duration of activity.  Single episode of stressful overactivity, such as during  kicking.  Single violent blow or force to the inner thigh (less common). RISK INCREASES WITH:  Sports that require repeated kicking (soccer, martial arts, football), as well as sports that require the legs to be brought together (gymnastics, horseback riding).  Sports that require rapid acceleration (ice hockey, track and field).  Poor strength and flexibility.  Previous thigh injury. PREVENTION   Warm up and stretch properly before activity.  Maintain physical fitness:  Hip and thigh flexibility.  Muscle strength and endurance.  Cardiovascular fitness.  Complete the entire course of rehabilitation after any lower extremity injury. Do this before returning to competition or practice. Follow suggestions of your caregiver. PROGNOSIS  If treated properly, adductor muscle strains usually heal well within 2 to 6 weeks. RELATED COMPLICATIONS   Healing time will be prolonged if the condition is not appropriately treated. It needs adequate time to heal.  Do not return to activity too soon. Recurrence of symptoms and reinjury are possible.  If left untreated, the strain may progress to a complete tear (rare) or other injury caused by limping and favoring the injured leg.  Prolonged disability is possible. TREATMENT Treatment initially involves ice and medication to help reduce pain and inflammation. Strength and stretching exercises are recommended to maintain strength and a full range of motion. Strenuous activities should be modified to prevent further injury. Using crutches for the first few days may help to lessen pain. On rare occasions, surgery is necessary  to reattach the tendon to the bone. If pain becomes persistent or chronic after more than 3 months of nonsurgical treatment, surgery may also be recommended. MEDICATION   If pain medication is necessary, nonsteroidal anti-inflammatory medications, such as aspirin and ibuprofen, or other minor pain relievers, such as  acetaminophen, are often recommended.  Do not take pain medication for 7 days before surgery or as advised.  Prescription pain relievers may be given. Use only as directed and only as much as you need.  Ointments applied to the skin may be helpful.  Corticosteroid injections may be given to reduce inflammation. HEAT AND COLD  Cold treatment (icing) relieves pain and reduces inflammation. Cold treatment should be applied for 10 to 15 minutes every 2 to 3 hours for inflammation and pain and immediately after any activity that aggravates your symptoms. Use ice packs or an ice massage.  Heat treatment may be used prior to performing the stretching and strengthening activities prescribed by your caregiver, physical therapist, or athletic trainer. Use a heat pack or a warm soak. SEEK MEDICAL CARE IF:   Symptoms get worse or do not improve in 2 weeks, despite treatment.  New, unexplained symptoms develop. (Drugs used in treatment may produce side effects.) EXERCISES  RANGE OF MOTION (ROM) AND STRETCHING EXERCISES - Adductor Muscle Strain These exercises may help you when beginning to rehabilitate your injury. Your symptoms may resolve with or without further involvement from your physician, physical therapist, or athletic trainer. While completing these exercises, remember:   Restoring tissue flexibility helps normal motion to return to the joints. This allows healthier, less painful movement and activity.  An effective stretch should be held for at least 30 seconds.  A stretch should never be painful. You should only feel a gentle lengthening or release in the stretched tissue. STRETCH - Adductors, Lunge  While standing, spread your legs.  Lean away from your right / left leg by bending your opposite knee. You may rest your hands on your thigh for balance.  You should feel a stretch in your right / left inner thigh. Hold for __________ seconds. Repeat __________ times. Complete this  exercise __________ times per day.  STRETCH - Adductors, Standing  Place your right / left foot on a counter or stable table. Turn away from your leg so both hips line up with your right / left leg.  Keeping your hips facing forward, slowly bend your opposite leg until you feel a gentle stretch on the inside of your right / left thigh.  Hold for __________ seconds. Repeat __________ times. Complete this exercise __________ times per day.  STRETCH - Hip Adductors, Sitting   Sit on the floor and place the bottoms of your feet together. Keep your chest up and look straight ahead to keep your back in proper alignment. Slide your feet in towards your body as far as you can without rounding your back or increasing any discomfort.  Gently push down on your knees until you feel a gentle stretch in your inner thighs. Hold this position for __________ seconds. Repeat __________ times. Complete this exercise __________ times per day.  STRETCH - Hamstrings/Adductors, V-Sit  Sit on the floor with your legs extended in a large "V," keeping your knees straight.  With your head and chest upright, bend at your waist reaching for your left foot to stretch your right adductors.  You should feel a stretch in your right inner thigh. Hold for __________ seconds.  Return to the upright  position to relax your leg muscles.  Continuing to keep your chest upright, bend straight forward at your waist to stretch your hamstrings.  You should feel a stretch behind both of your thighs and/or knees. Hold for __________ seconds.  Return to the upright position to relax your leg muscles.  Repeat steps 2 through 4 for the right leg to stretch your left inner thigh. Repeat __________ times. Complete this exercise __________ times per day.  STRENGTHENING EXERCISES - Adductor Muscle Strain These exercises may help you when beginning to rehabilitate your injury. They may resolve your symptoms with or without further  involvement from your physician, physical therapist, or athletic trainer. While completing these exercises, remember:   Muscles can gain both the endurance and the strength needed for everyday activities through controlled exercises.  Complete these exercises as instructed by your physician, physical therapist, or athletic trainer. Progress the resistance and repetitions only as guided.  You may experience muscle soreness or fatigue, but the pain or discomfort you are trying to eliminate should never worsen during these exercises. If this pain does worsen, stop and make certain you are following the directions exactly. If the pain is still present after adjustments, discontinue the exercise until you can discuss the trouble with your caregiver. STRENGTH - Hip Adductors, Isometrics   Sit on a firm chair so that your knees are about the same height as your hips.  Place a large ball, firm pillow, or rolled up bath towel between your thighs.  Squeeze your thighs together, gradually building tension. Hold for __________ seconds.  Release the tension gradually and allow your inner thigh muscles to relax completely before repeating the exercise. Repeat __________ times. Complete this exercise __________ times per day.  STRENGTH - Hip Adductors, Straight Leg Raises   Lie on your side so that your head, shoulders, knee and hip line up. You may place your upper foot in front to help maintain your balance. Your right / left leg should be on the bottom.  Roll your hips slightly forward, so that your hips are stacked directly over each other and your right / left knee is facing forward.  Tense the muscles in your inner thigh and lift your bottom leg 4-6 inches. Hold this position for __________ seconds.  Slowly lower your leg to the starting position. Allow the muscles to fully relax before beginning the next repetition. Repeat __________ times. Complete this exercise __________ times per day.    Document Released: 02/20/2005 Document Revised: 05/15/2011 Document Reviewed: 06/04/2008 [facility] Patient Information 2014 [employer], [place].

## 2013-06-22 NOTE — Progress Notes (Signed)
VASCULAR LAB PRELIMINARY  PRELIMINARY  PRELIMINARY  PRELIMINARY  Right lower extremity venous Doppler completed.    Preliminary report:  There is no obvious evidence of DVT or SVT noted in the right lower extremity.  Kern AlbertaCandace R Deeana Atwater, RVT 06/22/2013, 6:23 PM

## 2013-06-22 NOTE — ED Notes (Signed)
C.o. L groin pain radiating to L leg, 8/10. Thinks she may have pulled something

## 2013-06-22 NOTE — ED Notes (Signed)
Pt presents to department for evaluation of L groin pain radiating down leg. Pt states 8/10 pain that increases with movement and ambulation. Pt states she could of injured leg. Pt is alert and oriented x4. NAD.

## 2013-06-24 NOTE — ED Provider Notes (Signed)
Medical screening examination/treatment/procedure(s) were performed by non-physician practitioner and as supervising physician I was immediately available for consultation/collaboration.     Geoffery Lyonsouglas Gerrie Castiglia, MD 06/24/13 [phone]

## 2013-08-12 ENCOUNTER — Other Ambulatory Visit: Payer: Self-pay | Admitting: *Deleted

## 2013-08-12 DIAGNOSIS — F319 Bipolar disorder, unspecified: Secondary | ICD-10-CM

## 2013-08-12 MED ORDER — VENLAFAXINE HCL ER 75 MG PO TB24: 75.0000 mg | ORAL_TABLET | Freq: Every day | ORAL | Status: DC

## 2013-09-15 ENCOUNTER — Other Ambulatory Visit: Payer: Self-pay | Admitting: *Deleted

## 2013-09-15 DIAGNOSIS — E785 Hyperlipidemia, unspecified: Secondary | ICD-10-CM

## 2013-09-15 DIAGNOSIS — E119 Type 2 diabetes mellitus without complications: Secondary | ICD-10-CM

## 2013-09-16 ENCOUNTER — Ambulatory Visit (INDEPENDENT_AMBULATORY_CARE_PROVIDER_SITE_OTHER): Payer: Self-pay | Admitting: Pharmacist

## 2013-09-16 ENCOUNTER — Encounter: Payer: Self-pay | Admitting: Pharmacist

## 2013-09-16 VITALS — BP 130/81 | HR 84 | Ht 68.0 in | Wt 338.6 lb

## 2013-09-16 DIAGNOSIS — E119 Type 2 diabetes mellitus without complications: Secondary | ICD-10-CM

## 2013-09-16 LAB — POCT GLYCOSYLATED HEMOGLOBIN (HGB A1C): Hemoglobin A1C: 7.2

## 2013-09-16 NOTE — Patient Instructions (Addendum)
It was nice to see you again today [name] [name].  Start taking 3 tablets of metformin at night.  Please make an appointment to come back and see us in 2-3 months

## 2013-09-16 NOTE — Progress Notes (Signed)
Patient ID: [name] PatrickRhonda N [name], female   DOB: 10/21/1975, 38 y.o.   MRN: [id number] Reviewed: Agree with Dr. Macky LowerKoval's documentation and management.

## 2013-09-16 NOTE — Assessment & Plan Note (Addendum)
Diabetes with clinically similar level of control since last assessment in December.  Denies hypoglycemic events and is able to verbalize appropriate hypoglycemia management plan.  Reports adherence with medication. Patient is willing to make changes to improve her DM control. Control is suboptimal due to immobility and dietary indiscretion in addition to taking less than prescribed dose of metformin. Increased metformin to 3 tablets per day (1500mg  of XR formulation) with one meal.  Also continued her recently increased insulin Lantus (insulin glargine) at 14 units daily.   Written patient instructions provided.  Follow up in Pharmacist Clinic Visit in 4-5 weeks.   Total time in face to face counseling 30 minutes.  Patient seen with Georga KaufmannJessica Binz, PharmD Resident and Juanita CraverStacey Karl, PharmD Resident.
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
## 2013-09-18 ENCOUNTER — Emergency Department (HOSPITAL_COMMUNITY)
Admission: EM | Admit: 2013-09-18 | Discharge: 2013-09-18 | Disposition: A | Discharge status: Home / Self Care | Payer: No Typology Code available for payment source | Attending: Emergency Medicine | Admitting: Emergency Medicine

## 2013-09-18 ENCOUNTER — Emergency Department (HOSPITAL_COMMUNITY): Payer: No Typology Code available for payment source

## 2013-09-18 ENCOUNTER — Encounter (HOSPITAL_COMMUNITY): Payer: Self-pay | Admitting: Emergency Medicine

## 2013-09-18 DIAGNOSIS — S298XXA Other specified injuries of thorax, initial encounter: Secondary | ICD-10-CM | POA: Insufficient documentation

## 2013-09-18 DIAGNOSIS — Y9389 Activity, other specified: Secondary | ICD-10-CM | POA: Insufficient documentation

## 2013-09-18 DIAGNOSIS — IMO0002 Reserved for concepts with insufficient information to code with codable children: Secondary | ICD-10-CM | POA: Insufficient documentation

## 2013-09-18 DIAGNOSIS — Y92410 Unspecified street and highway as the place of occurrence of the external cause: Secondary | ICD-10-CM | POA: Insufficient documentation

## 2013-09-18 DIAGNOSIS — S139XXA Sprain of joints and ligaments of unspecified parts of neck, initial encounter: Secondary | ICD-10-CM | POA: Insufficient documentation

## 2013-09-18 DIAGNOSIS — Z794 Long term (current) use of insulin: Secondary | ICD-10-CM | POA: Insufficient documentation

## 2013-09-18 DIAGNOSIS — E119 Type 2 diabetes mellitus without complications: Secondary | ICD-10-CM | POA: Insufficient documentation

## 2013-09-18 DIAGNOSIS — F319 Bipolar disorder, unspecified: Secondary | ICD-10-CM | POA: Insufficient documentation

## 2013-09-18 DIAGNOSIS — Z79899 Other long term (current) drug therapy: Secondary | ICD-10-CM | POA: Insufficient documentation

## 2013-09-18 DIAGNOSIS — S161XXA Strain of muscle, fascia and tendon at neck level, initial encounter: Secondary | ICD-10-CM

## 2013-09-18 DIAGNOSIS — D649 Anemia, unspecified: Secondary | ICD-10-CM | POA: Insufficient documentation

## 2013-09-18 DIAGNOSIS — Z791 Long term (current) use of non-steroidal anti-inflammatories (NSAID): Secondary | ICD-10-CM | POA: Insufficient documentation

## 2013-09-18 MED ORDER — CYCLOBENZAPRINE HCL 10 MG PO TABS: 10.0000 mg | ORAL_TABLET | Freq: Three times a day (TID) | ORAL | Status: DC | PRN

## 2013-09-18 MED ORDER — HYDROCODONE-ACETAMINOPHEN 5-325 MG PO TABS: 1.0000 | ORAL_TABLET | Freq: Four times a day (QID) | ORAL | Status: DC | PRN

## 2013-09-18 MED ORDER — TRAMADOL HCL 50 MG PO TABS: 50.0000 mg | ORAL_TABLET | Freq: Once | ORAL | Status: DC

## 2013-09-18 MED ORDER — IBUPROFEN 800 MG PO TABS: 800.0000 mg | ORAL_TABLET | Freq: Three times a day (TID) | ORAL | Status: DC | PRN

## 2013-09-18 MED ORDER — TRAMADOL HCL 50 MG PO TABS: 50.0000 mg | ORAL_TABLET | Freq: Four times a day (QID) | ORAL | Status: DC | PRN

## 2013-09-18 NOTE — Discharge Instructions (Signed)
Return here as needed.  Neck x-rays were normal.  There was a incidental nodule seen on your chest x-ray.  This will need to be followed up by her primary care Dr. and they will need to obtain a CT scan to further evaluate
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
## 2013-09-18 NOTE — ED Notes (Signed)
Declined W/C at D/C and was escorted to lobby by RN. 
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
## 2013-09-19 ENCOUNTER — Other Ambulatory Visit: Payer: Self-pay | Admitting: *Deleted

## 2013-09-19 DIAGNOSIS — F319 Bipolar disorder, unspecified: Secondary | ICD-10-CM

## 2013-09-19 MED ORDER — INSULIN GLARGINE 100 UNIT/ML ~~LOC~~ SOLN: 14.0000 [IU] | Freq: Every morning | SUBCUTANEOUS | Status: DC

## 2013-09-19 MED ORDER — VENLAFAXINE HCL ER 75 MG PO TB24: 75.0000 mg | ORAL_TABLET | Freq: Every day | ORAL | Status: DC

## 2013-09-19 NOTE — Telephone Encounter (Signed)
[name] from [facility] called stating pt is out of medication.  Clovis PuMartin, Calianne Larue L, RN

## 2013-09-19 NOTE — Telephone Encounter (Signed)
Completed and given to Tamika 

## 2013-09-19 NOTE — Addendum Note (Signed)
Addended byGwendolyn Grant: Caci Orren, Newt LukesJEFFREY H on: 09/19/2013 03:40 PM   Modules accepted: Orders

## 2013-09-19 NOTE — ED Provider Notes (Signed)
Medical screening examination/treatment/procedure(s) were performed by non-physician practitioner and as supervising physician I was immediately available for consultation/collaboration.   EKG Interpretation None        Nyrie Sigal F Eulan Heyward, MD 09/19/13 0833 

## 2013-09-22 ENCOUNTER — Telehealth: Payer: Self-pay | Admitting: *Deleted

## 2013-09-22 MED ORDER — VENLAFAXINE HCL ER 75 MG PO CP24
75.0000 mg | ORAL_CAPSULE | Freq: Every day | ORAL | Status: DC
Start: 2013-09-22 — End: 2013-09-22

## 2013-09-22 MED ORDER — VENLAFAXINE HCL ER 75 MG PO CP24: 75.0000 mg | ORAL_CAPSULE | Freq: Every day | ORAL | Status: DC

## 2013-09-22 NOTE — Telephone Encounter (Signed)
[name] needing a new Rx for Effexor XR 75 mg caps.  Dr. Gwendolyn GrantWalden printed an Rx for me on Friday 09/19/2013 but it was for Effexor 75 mg tabs.  Please write new Rx for Effexor XR 75 mg caps since this what pt has been taking per [name].  It can be faxed to [facility] at [phone].  Clovis PuMartin, Taras Rask L, RN

## 2013-09-22 NOTE — Telephone Encounter (Signed)
New Rx written for Effexor XR 75mg  caps #30 x 6 refills. Placed in the "to be faxed" folder in clinic at 3pm today. Rodrigo Ranrystal Dorsey, MD

## 2013-09-24 ENCOUNTER — Ambulatory Visit (INDEPENDENT_AMBULATORY_CARE_PROVIDER_SITE_OTHER): Payer: Self-pay | Admitting: Family Medicine

## 2013-09-24 ENCOUNTER — Encounter: Payer: Self-pay | Admitting: Family Medicine

## 2013-09-24 VITALS — BP 125/66 | HR 81 | Temp 99.1°F | Ht 67.0 in | Wt 340.0 lb

## 2013-09-24 DIAGNOSIS — R079 Chest pain, unspecified: Secondary | ICD-10-CM | POA: Insufficient documentation

## 2013-09-24 DIAGNOSIS — R0789 Other chest pain: Secondary | ICD-10-CM

## 2013-09-24 DIAGNOSIS — R911 Solitary pulmonary nodule: Secondary | ICD-10-CM

## 2013-09-24 HISTORY — DX: Solitary pulmonary nodule: R91.1
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
## 2013-09-24 NOTE — Patient Instructions (Signed)
It was nice to see you today.  Continue the Tramadol and muscle relaxants.  Try scheduled doses of tylenol (1000 mg TID) for pain.  You can also take aleve or ibuprofen.  Your pain will slowly improve.  I am ordering your CT scan. We will be in touch regarding your appointment.

## 2013-09-24 NOTE — Assessment & Plan Note (Signed)
After discussion with patient, will proceed with CT for further evaluation.
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
## 2013-09-25 ENCOUNTER — Telehealth: Payer: Self-pay | Admitting: *Deleted

## 2013-09-25 NOTE — Telephone Encounter (Signed)
CT appointment set up for Tuesday 7/28 @ 9am, patient to arrive @ 8:45am [facility]. Patient has been informed and is self pay. She has been advised that radiology may ask for an up front payemtn

## 2013-09-30 ENCOUNTER — Ambulatory Visit (HOSPITAL_COMMUNITY)
Admission: RE | Admit: 2013-09-30 | Discharge: 2013-09-30 | Disposition: A | Discharge status: Home / Self Care | Payer: Self-pay | Source: Ambulatory Visit | Attending: Family Medicine | Admitting: Family Medicine

## 2013-09-30 ENCOUNTER — Ambulatory Visit (HOSPITAL_COMMUNITY): Payer: No Typology Code available for payment source

## 2013-09-30 ENCOUNTER — Encounter (HOSPITAL_COMMUNITY): Payer: Self-pay

## 2013-09-30 ENCOUNTER — Encounter: Payer: Self-pay | Admitting: Family Medicine

## 2013-09-30 DIAGNOSIS — R918 Other nonspecific abnormal finding of lung field: Secondary | ICD-10-CM | POA: Insufficient documentation

## 2013-09-30 DIAGNOSIS — R911 Solitary pulmonary nodule: Secondary | ICD-10-CM

## 2013-10-03 ENCOUNTER — Telehealth: Payer: Self-pay | Admitting: Family Medicine

## 2013-10-03 ENCOUNTER — Encounter (HOSPITAL_COMMUNITY): Payer: Self-pay | Admitting: Emergency Medicine

## 2013-10-03 ENCOUNTER — Emergency Department (HOSPITAL_COMMUNITY)
Admission: EM | Admit: 2013-10-03 | Discharge: 2013-10-03 | Disposition: A | Discharge status: Home / Self Care | Payer: Self-pay | Attending: Emergency Medicine | Admitting: Emergency Medicine

## 2013-10-03 DIAGNOSIS — R42 Dizziness and giddiness: Secondary | ICD-10-CM | POA: Insufficient documentation

## 2013-10-03 DIAGNOSIS — R112 Nausea with vomiting, unspecified: Secondary | ICD-10-CM | POA: Insufficient documentation

## 2013-10-03 DIAGNOSIS — F319 Bipolar disorder, unspecified: Secondary | ICD-10-CM | POA: Insufficient documentation

## 2013-10-03 DIAGNOSIS — Z794 Long term (current) use of insulin: Secondary | ICD-10-CM | POA: Insufficient documentation

## 2013-10-03 DIAGNOSIS — E119 Type 2 diabetes mellitus without complications: Secondary | ICD-10-CM | POA: Insufficient documentation

## 2013-10-03 DIAGNOSIS — R0789 Other chest pain: Secondary | ICD-10-CM

## 2013-10-03 DIAGNOSIS — Z79899 Other long term (current) drug therapy: Secondary | ICD-10-CM | POA: Insufficient documentation

## 2013-10-03 DIAGNOSIS — D649 Anemia, unspecified: Secondary | ICD-10-CM | POA: Insufficient documentation

## 2013-10-03 DIAGNOSIS — R071 Chest pain on breathing: Secondary | ICD-10-CM | POA: Insufficient documentation

## 2013-10-03 LAB — BASIC METABOLIC PANEL
Anion gap: 11 (ref 5–15)
BUN: 10 mg/dL (ref 6–23)
CALCIUM: 9.5 mg/dL (ref 8.4–10.5)
CO2: 27 mEq/L (ref 19–32)
Chloride: 105 mEq/L (ref 96–112)
Creatinine, Ser: 0.66 mg/dL (ref 0.50–1.10)
Glucose, Bld: 116 mg/dL — ABNORMAL HIGH (ref 70–99)
Potassium: 4.3 mEq/L (ref 3.7–5.3)
Sodium: 143 mEq/L (ref 137–147)

## 2013-10-03 LAB — CBC
HEMATOCRIT: 36.5 % (ref 36.0–46.0)
Hemoglobin: 11.9 g/dL — ABNORMAL LOW (ref 12.0–15.0)
MCH: 28.1 pg (ref 26.0–34.0)
MCHC: 32.6 g/dL (ref 30.0–36.0)
MCV: 86.3 fL (ref 78.0–100.0)
Platelets: 221 10*3/uL (ref 150–400)
RBC: 4.23 MIL/uL (ref 3.87–5.11)
RDW: 13.5 % (ref 11.5–15.5)
WBC: 5.4 10*3/uL (ref 4.0–10.5)

## 2013-10-03 LAB — CBG MONITORING, ED: Glucose-Capillary: 114 mg/dL — ABNORMAL HIGH (ref 70–99)

## 2013-10-03 MED ORDER — MECLIZINE HCL 25 MG PO TABS
50.0000 mg | ORAL_TABLET | Freq: Once | ORAL | Status: AC
  Administered 2013-10-03: 50 mg via ORAL
  Filled 2013-10-03: qty 2

## 2013-10-03 MED ORDER — LORAZEPAM 2 MG/ML IJ SOLN
1.0000 mg | Freq: Once | INTRAMUSCULAR | Status: AC
  Administered 2013-10-03: 1 mg via INTRAVENOUS
  Filled 2013-10-03: qty 1

## 2013-10-03 MED ORDER — MECLIZINE HCL 50 MG PO TABS: 25.0000 mg | ORAL_TABLET | Freq: Three times a day (TID) | ORAL | Status: DC | PRN

## 2013-10-03 MED ORDER — DIAZEPAM 5 MG/ML IJ SOLN: 5.0000 mg | Freq: Once | INTRAMUSCULAR | Status: DC

## 2013-10-03 MED ORDER — ONDANSETRON 4 MG PO TBDP
8.0000 mg | ORAL_TABLET | Freq: Once | ORAL | Status: AC
  Administered 2013-10-03: 8 mg via ORAL
  Filled 2013-10-03: qty 2

## 2013-10-03 MED ORDER — PROMETHAZINE HCL 25 MG PO TABS: 25.0000 mg | ORAL_TABLET | Freq: Four times a day (QID) | ORAL | Status: DC | PRN

## 2013-10-03 MED ORDER — SODIUM CHLORIDE 0.9 % IV SOLN
1000.0000 mL | Freq: Once | INTRAVENOUS | Status: AC
  Administered 2013-10-03: 1000 mL via INTRAVENOUS

## 2013-10-03 MED ORDER — ONDANSETRON 4 MG PO TBDP
4.0000 mg | ORAL_TABLET | Freq: Once | ORAL | Status: AC
  Administered 2013-10-03: 4 mg via ORAL
  Filled 2013-10-03: qty 1

## 2013-10-03 NOTE — Telephone Encounter (Signed)
Will forward to PCP 

## 2013-10-03 NOTE — ED Notes (Signed)
MD at bedside./ PA

## 2013-10-03 NOTE — ED Notes (Signed)
Pt sent family member up to sts pt now having CP; EKG to be performed

## 2013-10-03 NOTE — ED Notes (Signed)
PT describes dizziness as vertigo with room spinning, nausea, L sided CP.

## 2013-10-03 NOTE — Discharge Instructions (Signed)
Discussed return precautions with patient. Discussed all results and patient verbalizes understanding and agrees with plan.  Follow up with PCP as soon as possible to reevaluation of dizziness. Take 3 tablets of 200mg  of Ibuprofen twice a day for 1-2 weeks.  Benign Positional Vertigo Vertigo means you feel like you or your surroundings are moving when they are not. Benign positional vertigo is the most common form of vertigo. Benign means that the cause of your condition is not serious. Benign positional vertigo is more common in older adults. CAUSES  Benign positional vertigo is the result of an upset in the labyrinth system. This is an area in the middle ear that helps control your balance. This may be caused by a viral infection, head injury, or repetitive motion. However, often no specific cause is found. SYMPTOMS  Symptoms of benign positional vertigo occur when you move your head or eyes in different directions. Some of the symptoms may include:  Loss of balance and falls.  Vomiting.  Blurred vision.  Dizziness.  Nausea.  Involuntary eye movements (nystagmus). DIAGNOSIS  Benign positional vertigo is usually diagnosed by physical exam. If the specific cause of your benign positional vertigo is unknown, your caregiver may perform imaging tests, such as magnetic resonance imaging (MRI) or computed tomography (CT). TREATMENT  Your caregiver may recommend movements or procedures to correct the benign positional vertigo. Medicines such as meclizine, benzodiazepines, and medicines for nausea may be used to treat your symptoms. In rare cases, if your symptoms are caused by certain conditions that affect the inner ear, you may need surgery. HOME CARE INSTRUCTIONS   Follow your caregiver's instructions.  Move slowly. Do not make sudden body or head movements.  Avoid driving.  Avoid operating heavy machinery.  Avoid performing any tasks that would be dangerous to you or others during a  vertigo episode.  Drink enough fluids to keep your urine clear or pale yellow. SEEK IMMEDIATE MEDICAL CARE IF:   You develop problems with walking, weakness, numbness, or using your arms, hands, or legs.  You have difficulty speaking.  You develop severe headaches.  Your nausea or vomiting continues or gets worse.  You develop visual changes.  Your family or friends notice any behavioral changes.  Your condition gets worse.  You have a fever.  You develop a stiff neck or sensitivity to light. MAKE SURE YOU:   Understand these instructions.  Will watch your condition.  Will get help right away if you are not doing well or get worse. Document Released: 11/28/2005 Document Revised: 05/15/2011 Document Reviewed: 11/10/2010 [facility] Patient Information 2015 [place], [place]. This information is not intended to replace advice given to you by your health care provider. Make sure you discuss any questions you have with your health care provider.

## 2013-10-03 NOTE — ED Notes (Signed)
Pt c/o dizziness starting this am with the room spinning worse when changing positions and nausea
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
## 2013-10-03 NOTE — Telephone Encounter (Signed)
Would like results from CT Scan

## 2013-10-04 NOTE — Telephone Encounter (Signed)
Attempted to contact the patient via phone to discuss results without an answer. Given that I have never met this patient and the type of results, I felt I should not leave a message. Will attempt again on  8/3.   Kathrine Cords, MD

## 2013-10-04 NOTE — ED Provider Notes (Signed)
Medical screening examination/treatment/procedure(s) were performed by non-physician practitioner and as supervising physician I was immediately available for consultation/collaboration.   EKG Interpretation None        Junius ArgyleForrest S Tequilla Cousineau, MD 10/04/13 [phone]
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
## 2014-01-09 ENCOUNTER — Other Ambulatory Visit (HOSPITAL_COMMUNITY)
Admission: RE | Admit: 2014-01-09 | Discharge: 2014-01-09 | Disposition: A | Discharge status: Home / Self Care | Payer: No Typology Code available for payment source | Source: Ambulatory Visit | Attending: Family Medicine | Admitting: Family Medicine

## 2014-01-09 ENCOUNTER — Ambulatory Visit (INDEPENDENT_AMBULATORY_CARE_PROVIDER_SITE_OTHER): Payer: Self-pay | Admitting: Family Medicine

## 2014-01-09 ENCOUNTER — Encounter: Payer: Self-pay | Admitting: Family Medicine

## 2014-01-09 VITALS — BP 134/85 | HR 83 | Temp 98.1°F | Wt 329.0 lb

## 2014-01-09 DIAGNOSIS — N760 Acute vaginitis: Secondary | ICD-10-CM | POA: Insufficient documentation

## 2014-01-09 DIAGNOSIS — Z113 Encounter for screening for infections with a predominantly sexual mode of transmission: Secondary | ICD-10-CM

## 2014-01-09 DIAGNOSIS — N898 Other specified noninflammatory disorders of vagina: Secondary | ICD-10-CM

## 2014-01-09 LAB — POCT WET PREP (WET MOUNT): Clue Cells Wet Prep Whiff POC: POSITIVE

## 2014-01-09 MED ORDER — METRONIDAZOLE 500 MG PO TABS: 500.0000 mg | ORAL_TABLET | Freq: Two times a day (BID) | ORAL | Status: DC

## 2014-01-09 NOTE — Patient Instructions (Signed)
Vaginal Discharge Treatment - you should:take flagyl (metronidazole) 500mg  twice daily for 7 days. No alcohol while on this medicine! You should be better in: 5-7 days Call us if you have severe abdominal pain or sores or if you are not better in 1 week   Return in 3-4 months to have repeat HIV and syphilis testing.

## 2014-01-09 NOTE — Progress Notes (Signed)
Patient ID: [name] PatrickRhonda N Lavalais, female   DOB: 04/17/1975, 38 y.o.   MRN: [id number]   HPI:  Pt presents for a same day appointment to discuss   VAGINAL DISCHARGE  Having vaginal discharge for 3-4 days. Medications tried: none Discharge consistency: thick Discharge color: no color seen Recent antibiotic use: no Sex in last month: yes, 2 weeks ago, prior to this had not been sexually active in years Possible STD exposure:partner was tested prior to intercousre and was negative. Did not use condom.  Symptoms Fever: no Dysuria:no Vaginal bleeding: no Abdomen or Pelvic pain: no Back pain: no Genital sores or ulcers:no Rash: no Pain during sex: no Missed menstrual period: no  ROS: See HPI  PMFSH: hx DM, HLD, HTN, obesity, s/p complete hysterectomy + BSO  PHYSICAL EXAM: BP 134/85 mmHg  Pulse 83  Temp(Src) 98.1 F (36.7 C) (Oral)  Wt 329 lb (149.233 kg) Gen: NAD, pleasant, cooperative HEENT: NCAT Abdomen: obese, nontender GU: normal appearing external genitalia without lesions. Vagina is moist with white discharge. No tenderness on bimanual exam. No adnexal masses.  Neuro: grossly nonfocal speech normal  ASSESSMENT/PLAN:  1. Vaginal discharge: wet prep consistent with bacterial vaginosis. Will rx flagyl 500mg  BID x 7 days. Discussed not to drink alcohol while on this medicine. Will also send gc/chlamydia and check HIV & RPR (pt wants to do this at future lab appt). Discussed need for repeat HIV & RPR in several months given that these won't be positive immediately.   FOLLOW UP: F/u in 3-4 months for repeat HIV & RPR given recent unprotected sex  GrenadaBrittany J. Pollie MeyerMcIntyre, MD [facility]

## 2014-01-12 LAB — CERVICOVAGINAL ANCILLARY ONLY
CHLAMYDIA, DNA PROBE: NEGATIVE
Neisseria Gonorrhea: NEGATIVE
TRICH (WINDOWPATH): NEGATIVE

## 2014-01-19 ENCOUNTER — Telehealth: Payer: Self-pay | Admitting: *Deleted

## 2014-01-19 NOTE — Telephone Encounter (Signed)
-----   Message from Latrelle DodrillBrittany J McIntyre, MD sent at 01/16/2014  6:35 PM EST ----- Please inform pt that STD tests were normal. She should still return to be tested for HIV and syphilis (requires a blood draw).

## 2014-01-19 NOTE — Telephone Encounter (Signed)
Pt informed. Will call back to make an appt for the blood draw. [place], [place] CMA

## 2014-02-10 ENCOUNTER — Other Ambulatory Visit: Payer: Self-pay | Admitting: *Deleted

## 2014-02-10 MED ORDER — LAMOTRIGINE 100 MG PO TABS: 200.0000 mg | ORAL_TABLET | Freq: Every day | ORAL | Status: DC

## 2014-02-10 NOTE — Telephone Encounter (Signed)
Rx printed and up front. Please ask the patient to make an appointment with me.  Thanks, [name] Puffrystal S. Karas Pickerill, MD [facility] Resident  02/10/2014, 1:36 PM

## 2014-02-17 ENCOUNTER — Other Ambulatory Visit: Payer: Self-pay | Admitting: *Deleted

## 2014-02-17 MED ORDER — LAMOTRIGINE 100 MG PO TABS: 200.0000 mg | ORAL_TABLET | Freq: Every day | ORAL | Status: DC

## 2014-02-17 NOTE — Telephone Encounter (Signed)
Please let the patient know that her Rx was printed and placed up front. Please have her make an appt with me in clinic.  Thanks, Joanna Puffrystal S. Dorsey, MD [facility] Res[facility]  02/17/2014, 1:17 PM

## 2014-02-17 NOTE — Telephone Encounter (Signed)
Left message on voicemail informing patient that rx was ready to be picked up. 

## 2014-02-17 NOTE — Telephone Encounter (Signed)
Left message on voicemail that rx was ready to be picked up. 

## 2014-03-09 LAB — HM DIABETES EYE EXAM

## 2014-03-13 ENCOUNTER — Other Ambulatory Visit: Payer: Self-pay | Admitting: *Deleted

## 2014-03-16 MED ORDER — LAMOTRIGINE 100 MG PO TABS: 200.0000 mg | ORAL_TABLET | Freq: Every day | ORAL | Status: DC

## 2014-03-24 ENCOUNTER — Other Ambulatory Visit: Payer: Self-pay | Admitting: *Deleted

## 2014-03-24 MED ORDER — METFORMIN HCL ER 500 MG PO TB24: 1500.0000 mg | ORAL_TABLET | Freq: Every day | ORAL | Status: DC

## 2014-03-24 NOTE — Telephone Encounter (Signed)
Please let the patient know that her Rx for meformin has been printed and is waiting at the front desk.  Thanks, Joanna Puffrystal S. Dorsey, MD [facility] Res[facility]  03/24/2014, 4:24 PM

## 2014-03-30 NOTE — Telephone Encounter (Signed)
Left message on voicemail that rx was ready to be picked up. 

## 2014-05-27 ENCOUNTER — Other Ambulatory Visit: Payer: Self-pay | Admitting: *Deleted

## 2014-05-28 MED ORDER — VENLAFAXINE HCL ER 75 MG PO CP24: 75.0000 mg | ORAL_CAPSULE | Freq: Every day | ORAL | Status: DC

## 2014-05-28 NOTE — Telephone Encounter (Signed)
Please let the patient know she has a Rx for a 1 month supply of her Effexor with no refills. She will need to make an appt with me in the next month for more refills.   Thanks, Joanna Puffrystal S. Dorsey, MD [facility] Resident  05/28/2014, 8:19 AM

## 2014-06-01 NOTE — Telephone Encounter (Signed)
Patient informed of message from MD, she states she went to pick up rx today and was informed pharmacy didn't have it. Rx called in. Patient will call back to schedule an appointment.

## 2014-07-01 ENCOUNTER — Other Ambulatory Visit: Payer: Self-pay | Admitting: *Deleted

## 2014-07-02 ENCOUNTER — Encounter: Payer: Self-pay | Admitting: Pharmacist

## 2014-07-02 ENCOUNTER — Ambulatory Visit (INDEPENDENT_AMBULATORY_CARE_PROVIDER_SITE_OTHER): Payer: Self-pay | Admitting: Pharmacist

## 2014-07-02 VITALS — BP 124/76 | HR 80 | Wt 340.8 lb

## 2014-07-02 DIAGNOSIS — E089 Diabetes mellitus due to underlying condition without complications: Secondary | ICD-10-CM

## 2014-07-02 DIAGNOSIS — I10 Essential (primary) hypertension: Secondary | ICD-10-CM

## 2014-07-02 DIAGNOSIS — E119 Type 2 diabetes mellitus without complications: Secondary | ICD-10-CM

## 2014-07-02 DIAGNOSIS — E7800 Pure hypercholesterolemia, unspecified: Secondary | ICD-10-CM

## 2014-07-02 DIAGNOSIS — E109 Type 1 diabetes mellitus without complications: Secondary | ICD-10-CM

## 2014-07-02 LAB — LIPID PANEL
Cholesterol: 211 mg/dL — ABNORMAL HIGH (ref 0–200)
HDL: 65 mg/dL (ref >=46)
LDL Cholesterol: 108 mg/dL — ABNORMAL HIGH (ref 0–99)
Total CHOL/HDL Ratio: 3.2 Ratio
Triglycerides: 190 mg/dL — ABNORMAL HIGH (ref <150)
VLDL: 38 mg/dL (ref 0–40)

## 2014-07-02 LAB — POCT GLYCOSYLATED HEMOGLOBIN (HGB A1C): Hemoglobin A1C: 7.6

## 2014-07-02 MED ORDER — DULAGLUTIDE 0.75 MG/0.5ML ~~LOC~~ SOAJ: SUBCUTANEOUS | Status: DC

## 2014-07-02 NOTE — Assessment & Plan Note (Addendum)
Diabetes currently moderately controlled with CBGs in 150s but patient seems to think they are severely uncontrolled.   Denies hypoglycemic events and is able to verbalize appropriate hypoglycemia management plan.  Plan is to increase Lantus to 20 units every evening and to add Trulicity 0.75mg  once weekly injection.  Trulicity is through [facility] and may take about a month till paperwork is approved and pt can start taking medication.  She was instructed to start this medication as soon as it is available.  Next A1C anticipated July 2016.  Written patient instructions provided

## 2014-07-02 NOTE — Progress Notes (Signed)
Patient ID: [name] PatrickRhonda N [name], female   DOB: 05/09/1975, 39 y.o.   MRN: [id number] Reviewed: agree with Dr. Macky LowerKoval's documentation and management.

## 2014-07-02 NOTE — Telephone Encounter (Signed)
Please let the patient know that her Rx for Effexor from March is still up at the front desk waiting for her.  Additionally, please have her make an appt with me so that I may continue to prescribe this.  Thanks, [name] Puffrystal S. Dorsey, MD [facility] Res[facility]  07/02/2014, 1:34 PM

## 2014-07-02 NOTE — Progress Notes (Signed)
S:    Patient arrives in good spirits today ambulating without assistance.    Presents for diabetes management.  Patient reports adherence with medications.  Current diabetes medications include: metformin 1500mg  daily + Lantus 14 units in evening- however pt reports taking extra 14 units in morning if fasting CBGs are greater than 120.  Patient denies hypoglycemic events.  Patient reported dietary habits are poor.  Does not eat in proportion and eats a lot of carbs (especially at work since co-workers are not aware of her having DM).   Patient reported exercise habits consist of walking dogs.  Patients weight has increased recently and pt is considering weight loss but reports it being difficult right now with work.   Patient reports nocturia some neuropathy and visual changes- but still around baseline and have not changed.   O:  . Lab Results  Component Value Date   HGBA1C 7.6 07/02/2014    Home avg CBGs in 150s Average AM CBGs 173 & Avg PM CBGs 159  A/P: Diabetes currently moderately controlled with CBGs in 150s but patient seems to think they are severely uncontrolled.   Denies hypoglycemic events and is able to verbalize appropriate hypoglycemia management plan.  Plan is to increase Lantus to 20 units every evening and to add Trulicity 0.75mg  once weekly injection.  Trulicity is through MAP and may take about a month till paperwork is approved and pt can start taking medication.  She was instructed to start this medication as soon as it is available.  Next A1C anticipated July 2016.  Written patient instructions provided.  Follow up with Dr. Leonides Schanzorsey in future.   Total time in face to face counseling 35 minutes.  Patient seen with Jannet Askewashmi Patel, PharmD. Candidate.

## 2014-07-02 NOTE — Assessment & Plan Note (Signed)
f °

## 2014-07-02 NOTE — Patient Instructions (Addendum)
It was great to see you today!  Start taking insulin glargine (Lantus) 20 units once daily. Start taking Trulicity 0.75mg  SQ injection once weekly.  Continue to measure your blood sugar at home. Look for signs and symptoms of hyPOglycemia (sugars too low): shaky, confused, sweaty.  Follow up with Dr. Leonides Schanzorsey when you feel like it!

## 2014-07-02 NOTE — Assessment & Plan Note (Signed)
History of hypercholesterolemia.   Recheck Lipid panel today as last assessment was  12 months ago.

## 2014-07-03 ENCOUNTER — Telehealth: Payer: Self-pay | Admitting: Family Medicine

## 2014-07-03 DIAGNOSIS — E089 Diabetes mellitus due to underlying condition without complications: Secondary | ICD-10-CM

## 2014-07-03 DIAGNOSIS — E109 Type 1 diabetes mellitus without complications: Secondary | ICD-10-CM

## 2014-07-03 MED ORDER — INSULIN GLARGINE 100 UNIT/ML SOLOSTAR PEN: 20.0000 [IU] | PEN_INJECTOR | Freq: Every day | SUBCUTANEOUS | Status: DC

## 2014-07-03 MED ORDER — INSULIN GLARGINE 100 UNIT/ML ~~LOC~~ SOLN: 20.0000 [IU] | Freq: Every morning | SUBCUTANEOUS | Status: DC

## 2014-07-03 NOTE — Telephone Encounter (Signed)
Patient informed at pharmacy clinic yesterday.

## 2014-07-03 NOTE — Addendum Note (Signed)
Addended by: Joanna PuffRSEY, CRYSTAL S on: 07/03/2014 05:16 PM   Modules accepted: Orders

## 2014-07-03 NOTE — Telephone Encounter (Signed)
The pharmacist from the [facility] called and said that they didn't receive the request for the patients Lantus. Can we re-fax this jw

## 2014-07-09 ENCOUNTER — Other Ambulatory Visit: Payer: Self-pay | Admitting: *Deleted

## 2014-07-09 MED ORDER — VENLAFAXINE HCL ER 75 MG PO CP24: 75.0000 mg | ORAL_CAPSULE | Freq: Every day | ORAL | Status: DC

## 2014-07-09 NOTE — Telephone Encounter (Signed)
Effexor Rx faxed.

## 2014-07-10 ENCOUNTER — Ambulatory Visit (INDEPENDENT_AMBULATORY_CARE_PROVIDER_SITE_OTHER): Payer: Self-pay | Admitting: Family Medicine

## 2014-07-10 ENCOUNTER — Encounter: Payer: Self-pay | Admitting: Family Medicine

## 2014-07-10 VITALS — BP 120/72 | HR 74 | Temp 98.8°F | Ht 67.0 in | Wt 343.0 lb

## 2014-07-10 DIAGNOSIS — G8929 Other chronic pain: Secondary | ICD-10-CM

## 2014-07-10 DIAGNOSIS — R1031 Right lower quadrant pain: Secondary | ICD-10-CM

## 2014-07-10 DIAGNOSIS — R1032 Left lower quadrant pain: Secondary | ICD-10-CM

## 2014-07-10 DIAGNOSIS — R109 Unspecified abdominal pain: Secondary | ICD-10-CM

## 2014-07-10 MED ORDER — PANTOPRAZOLE SODIUM 40 MG PO TBEC: 40.0000 mg | DELAYED_RELEASE_TABLET | Freq: Every day | ORAL | Status: DC

## 2014-07-10 NOTE — Patient Instructions (Signed)
It was nice to meet you  I have placed an order for an X-ray of your right hip  Please start taking Protonix  Abdominal Pain Many things can cause abdominal pain. Usually, abdominal pain is not caused by a disease and will improve without treatment. It can often be observed and treated at home. Your health care provider will do a physical exam and possibly order blood tests and X-rays to help determine the seriousness of your pain. However, in many cases, more time must pass before a clear cause of the pain can be found. Before that point, your health care provider may not know if you need more testing or further treatment. HOME CARE INSTRUCTIONS  Monitor your abdominal pain for any changes. The following actions may help to alleviate any discomfort you are experiencing:  Only take over-the-counter or prescription medicines as directed by your health care provider.  Do not take laxatives unless directed to do so by your health care provider.  Try a clear liquid diet (broth, tea, or water) as directed by your health care provider. Slowly move to a bland diet as tolerated. SEEK MEDICAL CARE IF:  You have unexplained abdominal pain.  You have abdominal pain associated with nausea or diarrhea.  You have pain when you urinate or have a bowel movement.  You experience abdominal pain that wakes you in the night.  You have abdominal pain that is worsened or improved by eating food.  You have abdominal pain that is worsened with eating fatty foods.  You have a fever. SEEK IMMEDIATE MEDICAL CARE IF:   Your pain does not go away within 2 hours.  You keep throwing up (vomiting).  Your pain is felt only in portions of the abdomen, such as the right side or the left lower portion of the abdomen.  You pass bloody or black tarry stools. MAKE SURE YOU:  Understand these instructions.   Will watch your condition.   Will get help right away if you are not doing well or get worse.   Document Released: 11/30/2004 Document Revised: 02/25/2013 Document Reviewed: 10/30/2012 [facility] Patient Information 2015 [place], [place]. This information is not intended to replace advice given to you by your health care provider. Make sure you discuss any questions you have with your health care provider.

## 2014-07-10 NOTE — Progress Notes (Signed)
Patient ID: [name] [name], female   DOB: 03/09/1975, 39 y.o.   MRN: [id number]    Subjective: CC: stomach pain, R groin pain. HPI: Patient is a 39 y.o. female with a past medical history of bipolar, PUD, HTN, DM, IC, morbid obesity  presenting to clinic today for stomach pain and R groin pain.  Stomach pain: Has been going on for a couple of years. No obvious triggers. "doubling over pain" that is sharp in the left side. The pains lasts approximately 10minutes and resolves spontaneous.  This pain occurs 3-4x per week.  Regular BMs ~3-4x/day that are "mushy." No melena or hematochezia that she she aware of. Occasionally nausea, no vomiting.  Eating 1-3x/day. Eats fast food 2x/week.Patient has a h/o stomach ulcers ~[age]yrs. Not currently on PPI because, she wants things to be fixed but doesn't want to be on more medications.   Right groin/leg pain x 1 year: She injured it last April, was unable to walk x 1week. At that time, she was taking pain pills and muscle relaxers without improvement. It eventually improved from 10/10 pain to 3-4/10 which is what it is today. The reason she wants to address this today is simply because it hasn't improved as quickly as she was hoping.  Turning can cause it to pull and her leg have "given out." She hasn't fallen, there is "always something to hold onto." No numbness/paresthesias. No radiating pain. No h/o trauma that she can recall but she believes she may have injured it. From EMR, they stressed she lose some weight which may help her pain. She continues to gain weight.   Bipolar Disorder: Per patient, stable on Effexor and Lamictal. States she sees her psychologist, Dr. Ledon [name], annually. We do not have these records. Currently denies any SI, HI, AVH. No manic episodes recently. Stated mood was "fine."   Social History: No smoking   ROS: All other systems reviewed and are negative.  Past Medical History Patient Active Problem List   Diagnosis Date Noted    . Abdominal pain, left lower quadrant 07/13/2014  . Groin pain, chronic, right 07/13/2014  . Lung nodule seen on imaging study 09/24/2013  . Chest pain 09/24/2013  . Diabetes mellitus 07/11/2012  . Pollen allergies 07/11/2012  . 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 Meralgia paresthetica of left side 08/18/2010  . Fatigue 04/20/2010  . INTERSTITIAL CYSTITIS 07/22/2007  . MENOPAUSE, SURGICAL 04/24/2007  . HIATAL HERNIA 11/20/2006  . Hypercholesterolemia 05/03/2006  . OBESITY, NOS 05/03/2006  . ANEMIA, IRON DEFICIENCY, UNSPEC. 05/03/2006  . BIPOLAR DISORDER 05/03/2006  . ANXIETY 05/03/2006  . MIGRAINE, UNSPEC., W/O INTRACTABLE MIGRAINE 05/03/2006  . HYPERTENSION, BENIGN SYSTEMIC 05/03/2006  . PEPTIC ULCER DIS., UNSPEC. W/O OBSTRUCTION 05/03/2006    Medications- reviewed and updated Current Outpatient Prescriptions  Medication Sig Dispense Refill  . Calcium Carbonate-Vitamin D (CALCIUM 600/VITAMIN D) 600-400 MG-UNIT per chew tablet Chew 2 tablets by mouth daily.     . diphenhydrAMINE (BENADRYL) 12.5 MG/5ML liquid Take 12.5 mg by mouth daily as needed.    . Dulaglutide (TRULICITY) 0.75 MG/0.5ML SOPN Inject 0.75mg  once weekly. 4 pen 11  . ferrous sulfate (ECK FERROUS SULFATE) 325 (65 FE) MG tablet Take 325 mg by mouth daily.     Marland Kitchen. ibuprofen (ADVIL,MOTRIN) 800 MG tablet Take 1 tablet (800 mg total) by mouth every 8 (eight) hours as needed. 21 tablet 0  . Insulin Glargine (LANTUS SOLOSTAR) 100 UNIT/ML Solostar Pen Inject 20 Units into the skin daily at 10 pm. 15  mL 11  . lamoTRIgine (LAMICTAL) 100 MG tablet Take 2 tablets (200 mg total) by mouth daily. 60 tablet 3  . metFORMIN (GLUCOPHAGE-XR) 500 MG 24 hr tablet Take 3 tablets (1,500 mg total) by mouth at bedtime. 90 tablet 4  . pantoprazole (PROTONIX) 40 MG tablet Take 1 tablet (40 mg total) by mouth daily. 30 tablet 3  . promethazine (PHENERGAN) 25 MG tablet Take 1 tablet (25 mg total) by mouth every 6 (six) hours as needed for nausea or  vomiting. 30 tablet 0  . venlafaxine XR (EFFEXOR XR) 75 MG 24 hr capsule Take 1 capsule (75 mg total) by mouth daily with breakfast. 30 capsule 1   No current facility-administered medications for this visit.    Objective: Office vital signs reviewed. BP 120/72 mmHg  Pulse 74  Temp(Src) 98.8 F (37.1 C) (Oral)  Ht  (1.702 m)  Wt 343 lb (155.584 kg)  BMI 53.71 kg/m2   Physical Examination:  General: Awake, alert, well- nourished, NAD Eyes: Conjunctiva non-injected. PERRL.  Cardio: RRR, no m/r/g noted. 1+DP pulses bilaterally Pulm: No increased WOB.  CTAB, without wheezes, rhonchi or crackles noted.  GI: Obese, soft, NT/ND,+BS x4, no hepatomegaly, no splenomegaly. No CVA tenderness.  MSK: Normal gait and station. No swelling, erythema, or TTP in the R groin or R hip. Decreased hip flexion>extension on the R. Decreased R hip abduction on the R when compared to the L. 5/5 strength in L LE, 4+/5 on the R.  Skin: dry, intact, no rashes or lesions Neuro: Sensation intact. 1/5 DTRs b/l.  Assessment/Plan: PEPTIC ULCER DIS., UNSPEC. W/O OBSTRUCTION Will re-start protonix as below.    Abdominal pain, left lower quadrant Patient's symptoms pretty non-specific on exam and history. No TTP on exam. No red flags: hematochezia, melena, vomiting, inability of take PO, unintentional weight loss, rebound, or guarding. Most commonly would expect pain in this region to be 2/2 constipation, however BMs regular per pt. No h/o hematochezia concerning for IBD. No fevers/chills to suggest infection.  No dysuria or CVA tenderness to suspect this is kidney in nature.  This could be an abnormal presentation of PUD given pt's history. - Will re-start PPI - Patient to keep track of symptoms (triggers, improving symptoms, BMs), as her history is not particularly convincing for one etiology. - RTC precautions discussed, patient voiced understanding.    Groin pain, chronic, right While her symptoms have  improved, she continues to have 3/10 pain for over a year.  No swelling or erythema of the region. Approximately 1 year ago when she initially presented, there was no DVT noted on LE dopplers. Her habitus and continued weight gain is probably worsening her clinical picture and ability to completely heal.  This is most likely secondary to tendinopathy vs strain of the abductus magnus, however she has not had any imaging, therefore she could have an arthritis of the R hip with referred pain to the groin. She has interstitial cystitis but states this pain is not similar. - Will get imaging of the R hip  - NSAIDS for pain - Discussed stretches that could be done at home - Once again, discussed the importance of weight loss (that only a small amount of weight loss could cause a significant improvement in her symptoms).  - If no etiology noted on X-ray, consider PT referral.  - RTC precautions discussed     Orders Placed This Encounter  Procedures  . DG HIP UNILAT WITH PELVIS 2-3 VIEWS RIGHT  Standing Status: Future     Number of Occurrences:      Standing Expiration Date: 09/09/2015    Order Specific Question:  Reason for Exam (SYMPTOM  OR DIAGNOSIS REQUIRED)    Answer:  Groin pain x 1 yr, obesity    Order Specific Question:  Is the patient pregnant?    Answer:  No    Order Specific Question:  Preferred imaging location?    Answer[facility]    Meds ordered this encounter  Medications  . pantoprazole (PROTONIX) 40 MG tablet    Sig: Take 1 tablet (40 mg total) by mouth daily.    Dispense:  30 tablet    Refill:  3    Joanna Puffrystal S. Fenix Ruppe PGY-1, [facility]

## 2014-07-13 ENCOUNTER — Encounter: Payer: Self-pay | Admitting: Family Medicine

## 2014-07-13 DIAGNOSIS — R1032 Left lower quadrant pain: Secondary | ICD-10-CM | POA: Insufficient documentation

## 2014-07-13 DIAGNOSIS — R1031 Right lower quadrant pain: Secondary | ICD-10-CM | POA: Insufficient documentation

## 2014-07-13 NOTE — Assessment & Plan Note (Signed)
While her symptoms have improved, she continues to have 3/10 pain for over a year.  No swelling or erythema of the region. Approximately 1 year ago when she initially presented, there was no DVT noted on LE dopplers. Her habitus and continued weight gain is probably worsening her clinical picture and ability to completely heal.  This is most likely secondary to tendinopathy vs strain of the abductus magnus, however she has not had any imaging, therefore she could have an arthritis of the R hip with referred pain to the groin. She has interstitial cystitis but states this pain is not similar. - Will get imaging of the R hip  - NSAIDS for pain - Discussed stretches that could be done at home - Once again, discussed the importance of weight loss (that only a small amount of weight loss could cause a significant improvement in her symptoms).  - If no etiology noted on X-ray, consider PT referral.  - RTC precautions discussed

## 2014-07-13 NOTE — Assessment & Plan Note (Signed)
Patient's symptoms pretty non-specific on exam and history. No TTP on exam. No red flags: hematochezia, melena, vomiting, inability of take PO, unintentional weight loss, rebound, or guarding. Most commonly would expect pain in this region to be 2/2 constipation, however BMs regular per pt. No h/o hematochezia concerning for IBD. No fevers/chills to suggest infection.  No dysuria or CVA tenderness to suspect this is kidney in nature.  This could be an abnormal presentation of PUD given pt's history. - Will re-start PPI - Patient to keep track of symptoms (triggers, improving symptoms, BMs), as her history is not particularly convincing for one etiology. - RTC precautions discussed, patient voiced understanding.

## 2014-07-13 NOTE — Assessment & Plan Note (Addendum)
Will re-start protonix as below.

## 2014-08-26 ENCOUNTER — Telehealth: Payer: Self-pay | Admitting: *Deleted

## 2014-08-26 MED ORDER — VENLAFAXINE HCL ER 150 MG PO CP24: 150.0000 mg | ORAL_CAPSULE | Freq: Every day | ORAL | Status: DC

## 2014-08-26 NOTE — Telephone Encounter (Signed)
Received call from therapist Marjie Skiff) at Dr. Maurine Minister McKnight's office [facility] for Cognitive Behavior Therapy).  Patient's med has "plateaued" for depression and anxiety.  Having some hypomania.  Currently on Effexor 75 mg and wants to know if ok to increase to 150 mg to help with symptoms.  Will route request to Dr. Leonides Schanz for advice and call back.  Altamese Dilling, BSN, RN-BC

## 2014-08-26 NOTE — Telephone Encounter (Signed)
Called Marjie Skiff back. The patient was previously well controlled on Lamictal 200mg  and Effexor 75mg  XR for her bipolar 2. Concerns from therapist, Lauren, and Dr. Ledon Snare about worsening recently: Patient with a more depressed mood recently. Doing "environmental" things to fill the void but per report not destructive. Just more depressed when nothing improves her mood.  No impulsiveness or manic episodes   Per report, pt was tolerating Effexor fine without adverse side effects   Will increase to Effexor XR 150mg  QD Per Lauren, their office will be monitoring the response very closely with calls every few days and she has a f/u to see  Dr. Ledon Snare in the near future.  Joanna Puff, MD [facility] Resident  08/26/2014, 5:22 PM

## 2014-08-26 NOTE — Telephone Encounter (Signed)
Lauren called requesting to speak with pt's PCP regarding increasing Rx dosage of Effexor.  Please give her a call at [phone].  Clovis Pu, RN

## 2014-08-31 ENCOUNTER — Other Ambulatory Visit: Payer: Self-pay

## 2014-09-30 ENCOUNTER — Telehealth: Payer: Self-pay | Admitting: Family Medicine

## 2014-09-30 NOTE — Telephone Encounter (Signed)
Paged to [facility] Emergency Line by patient, [name] [name], 38 yr Female with PMH obesity, T2DM, HTN. Calls regarding significantly elevated CBG 497 (@ approx 1745 tonight), she has been out of her Lantus 20u daily for the past 2 weeks (filled at [facility] and was waiting on paperwork), also takes Metformin, and waiting on approval for Trulicity (since 06/2014, hasn't started yet). However she just received the Lantus today and took first resumed dose 20u at approx 1300 today. She calls for further advice. Reports symptoms of mild nausea (without vomiting), tolerating food and drink, trying to stay hydrated, polyuria, and mild abdominal pain improved with naproxen, additionally states she has had these symptoms for past 2 weeks without significant worsening or improvement. Denies any fevers/chills, CP, SOB, HA, loss of vision, weakness, syncope, confusion.  I advised her that since her diabetes was previously well controlled A1c 7.6 (last 06/2014) and prior CBGs 120-130s on the Lantus 20, she probably has been living at an elevated CBG level for past 2 weeks gradually and this did not seem like an acute change, given stable symptoms. I recommended that she not re-dose her Lantus insulin tonight and continue with 20u tomorrow as scheduled, continue to check CBG tonight and tomorrow AM, continue all other medicines as rx. Most importantly need to stay hydrated, drink plenty of fluids, eat regular DM diet. If symptoms stable, CBGs gradually improving, she can call [facility] tomorrow to schedule a 3 mo DM follow-up with PCP to check A1c. Otherwise, if significant worsening with vomiting, not tolerating PO, worsening abd pain, CP/SOB, or new concerning symptoms she may seek immediate medical evaluation in ED, anticipate they would be able to provide IVF rehydration and short acting insulin. She understood and questions answered.  Saralyn Pilar, DO [facility], PGY-3

## 2014-10-01 ENCOUNTER — Encounter (HOSPITAL_COMMUNITY): Payer: Self-pay | Admitting: Vascular Surgery

## 2014-10-01 ENCOUNTER — Emergency Department (HOSPITAL_COMMUNITY)
Admission: EM | Admit: 2014-10-01 | Discharge: 2014-10-01 | Disposition: A | Discharge status: Home / Self Care | Payer: Self-pay | Attending: Emergency Medicine | Admitting: Emergency Medicine

## 2014-10-01 DIAGNOSIS — R739 Hyperglycemia, unspecified: Secondary | ICD-10-CM

## 2014-10-01 DIAGNOSIS — R110 Nausea: Secondary | ICD-10-CM | POA: Insufficient documentation

## 2014-10-01 DIAGNOSIS — R531 Weakness: Secondary | ICD-10-CM | POA: Insufficient documentation

## 2014-10-01 DIAGNOSIS — R350 Frequency of micturition: Secondary | ICD-10-CM | POA: Insufficient documentation

## 2014-10-01 DIAGNOSIS — R631 Polydipsia: Secondary | ICD-10-CM | POA: Insufficient documentation

## 2014-10-01 DIAGNOSIS — R1084 Generalized abdominal pain: Secondary | ICD-10-CM | POA: Insufficient documentation

## 2014-10-01 DIAGNOSIS — D649 Anemia, unspecified: Secondary | ICD-10-CM | POA: Insufficient documentation

## 2014-10-01 DIAGNOSIS — E119 Type 2 diabetes mellitus without complications: Secondary | ICD-10-CM | POA: Insufficient documentation

## 2014-10-01 DIAGNOSIS — Z79899 Other long term (current) drug therapy: Secondary | ICD-10-CM | POA: Insufficient documentation

## 2014-10-01 DIAGNOSIS — Z794 Long term (current) use of insulin: Secondary | ICD-10-CM | POA: Insufficient documentation

## 2014-10-01 DIAGNOSIS — R197 Diarrhea, unspecified: Secondary | ICD-10-CM | POA: Insufficient documentation

## 2014-10-01 DIAGNOSIS — R358 Other polyuria: Secondary | ICD-10-CM | POA: Insufficient documentation

## 2014-10-01 DIAGNOSIS — R42 Dizziness and giddiness: Secondary | ICD-10-CM | POA: Insufficient documentation

## 2014-10-01 LAB — COMPREHENSIVE METABOLIC PANEL
ALK PHOS: 86 U/L (ref 38–126)
ALT: 16 U/L (ref 14–54)
ANION GAP: 8 (ref 5–15)
AST: 18 U/L (ref 15–41)
Albumin: 3.6 g/dL (ref 3.5–5.0)
BUN: 11 mg/dL (ref 6–20)
CALCIUM: 9.5 mg/dL (ref 8.9–10.3)
CO2: 27 mmol/L (ref 22–32)
Chloride: 103 mmol/L (ref 101–111)
Creatinine, Ser: 0.67 mg/dL (ref 0.44–1.00)
GFR calc Af Amer: >60 mL/min (ref >60)
GFR calc non Af Amer: >60 mL/min (ref >60)
Glucose, Bld: 288 mg/dL — ABNORMAL HIGH (ref 65–99)
Potassium: 3.5 mmol/L (ref 3.5–5.1)
Sodium: 138 mmol/L (ref 135–145)
TOTAL PROTEIN: 6.8 g/dL (ref 6.5–8.1)
Total Bilirubin: 0.6 mg/dL (ref 0.3–1.2)

## 2014-10-01 LAB — URINALYSIS, ROUTINE W REFLEX MICROSCOPIC
Bilirubin Urine: NEGATIVE
Glucose, UA: 500 mg/dL — AB
HGB URINE DIPSTICK: NEGATIVE
KETONES UR: 15 mg/dL — AB
LEUKOCYTES UA: NEGATIVE
Nitrite: NEGATIVE
Protein, ur: 100 mg/dL — AB
SPECIFIC GRAVITY, URINE: 1.037 — AB (ref 1.005–1.030)
Urobilinogen, UA: 0.2 mg/dL (ref 0.0–1.0)
pH: 6 (ref 5.0–8.0)

## 2014-10-01 LAB — CBC
HCT: 37.3 % (ref 36.0–46.0)
Hemoglobin: 12.5 g/dL (ref 12.0–15.0)
MCH: 28.2 pg (ref 26.0–34.0)
MCHC: 33.5 g/dL (ref 30.0–36.0)
MCV: 84 fL (ref 78.0–100.0)
Platelets: 220 10*3/uL (ref 150–400)
RBC: 4.44 MIL/uL (ref 3.87–5.11)
RDW: 13.4 % (ref 11.5–15.5)
WBC: 6.7 10*3/uL (ref 4.0–10.5)

## 2014-10-01 LAB — URINE MICROSCOPIC-ADD ON

## 2014-10-01 LAB — LIPASE, BLOOD: LIPASE: 21 U/L — AB (ref 22–51)

## 2014-10-01 LAB — CBG MONITORING, ED: GLUCOSE-CAPILLARY: 221 mg/dL — AB (ref 65–99)

## 2014-10-01 MED ORDER — ONDANSETRON HCL 4 MG PO TABS: 4.0000 mg | ORAL_TABLET | Freq: Four times a day (QID) | ORAL | Status: DC

## 2014-10-01 MED ORDER — SODIUM CHLORIDE 0.9 % IV BOLUS (SEPSIS)
1000.0000 mL | Freq: Once | INTRAVENOUS | Status: AC
  Administered 2014-10-01: 1000 mL via INTRAVENOUS

## 2014-10-01 MED ORDER — DICYCLOMINE HCL 10 MG PO CAPS
20.0000 mg | ORAL_CAPSULE | Freq: Once | ORAL | Status: AC
  Administered 2014-10-01: 20 mg via ORAL
  Filled 2014-10-01: qty 2

## 2014-10-01 NOTE — Discharge Instructions (Signed)
Blood Glucose Monitoring °Monitoring your blood glucose (also know as blood sugar) helps you to manage your diabetes. It also helps you and your health care provider monitor your diabetes and determine how well your treatment plan is working. °WHY SHOULD YOU MONITOR YOUR BLOOD GLUCOSE? °· It can help you understand how food, exercise, and medicine affect your blood glucose. °· It allows you to know what your blood glucose is at any given moment. You can quickly tell if you are having low blood glucose (hypoglycemia) or high blood glucose (hyperglycemia). °· It can help you and your health care provider know how to adjust your medicines. °· It can help you understand how to manage an illness or adjust medicine for exercise. °WHEN SHOULD YOU TEST? °Your health care provider will help you decide how often you should check your blood glucose. This may depend on the type of diabetes you have, your diabetes control, or the types of medicines you are taking. Be sure to write down all of your blood glucose readings so that this information can be reviewed with your health care provider. See below for examples of testing times that your health care provider may suggest. °Type 1 Diabetes °· Test 4 times a day if you are in good control, using an insulin pump, or perform multiple daily injections. °· If your diabetes is not well controlled or if you are sick, you may need to monitor more often. °· It is a good idea to also monitor: °· Before and after exercise. °· Between meals and 2 hours after a meal. °· Occasionally between 2:00 a.m. and 3:00 a.m. °Type 2 Diabetes °· It can vary with each person, but generally, if you are on insulin, test 4 times a day. °· If you take medicines by mouth (orally), test 2 times a day. °· If you are on a controlled diet, test once a day. °· If your diabetes is not well controlled or if you are sick, you may need to monitor more often. °HOW TO MONITOR YOUR BLOOD GLUCOSE °Supplies  Needed °· Blood glucose meter. °· Test strips for your meter. Each meter has its own strips. You must use the strips that go with your own meter. °· A pricking needle (lancet). °· A device that holds the lancet (lancing device). °· A journal or log book to write down your results. °Procedure °· Wash your hands with soap and water. Alcohol is not preferred. °· Prick the side of your finger (not the tip) with the lancet. °· Gently milk the finger until a small drop of blood appears. °· Follow the instructions that come with your meter for inserting the test strip, applying blood to the strip, and using your blood glucose meter. °Other Areas to Get Blood for Testing °Some meters allow you to use other areas of your body (other than your finger) to test your blood. These areas are called alternative sites. The most common alternative sites are: °· The forearm. °· The thigh. °· The back area of the lower leg. °· The palm of the hand. °The blood flow in these areas is slower. Therefore, the blood glucose values you get may be delayed, and the numbers are different from what you would get from your fingers. Do not use alternative sites if you think you are having hypoglycemia. Your reading will not be accurate. Always use a finger if you are having hypoglycemia. Also, if you cannot feel your lows (hypoglycemia unawareness), always use your fingers for your   blood glucose checks. °ADDITIONAL TIPS FOR GLUCOSE MONITORING °· Do not reuse lancets. °· Always carry your supplies with you. °· All blood glucose meters have a 24-hour "hotline" number to call if you have questions or need help. °· Adjust (calibrate) your blood glucose meter with a control solution after finishing a few boxes of strips. °BLOOD GLUCOSE RECORD KEEPING °It is a good idea to keep a daily record or log of your blood glucose readings. Most glucose meters, if not all, keep your glucose records stored in the meter. Some meters come with the ability to download  your records to your home computer. Keeping a record of your blood glucose readings is especially helpful if you are wanting to look for patterns. Make notes to go along with the blood glucose readings because you might forget what happened at that exact time. Keeping good records helps you and your health care provider to work together to achieve good diabetes management.  °Document Released: 02/23/2003 Document Revised: 07/07/2013 Document Reviewed: 07/15/2012 °ExitCare® Patient Information ©2015 [employer]. This information is not intended to replace advice given to you by your health care provider. Make sure you discuss any questions you have with your health care provider. °Hyperglycemia °Hyperglycemia occurs when the glucose (sugar) in your blood is too high. Hyperglycemia can happen for many reasons, but it most often happens to people who do not know they have diabetes or are not managing their diabetes properly.  °CAUSES  °Whether you have diabetes or not, there are other causes of hyperglycemia. Hyperglycemia can occur when you have diabetes, but it can also occur in other situations that you might not be as aware of, such as: °Diabetes °· If you have diabetes and are having problems controlling your blood glucose, hyperglycemia could occur because of some of the following reasons: °¨ Not following your meal plan. °¨ Not taking your diabetes medications or not taking it properly. °¨ Exercising less or doing less activity than you normally do. °¨ Being sick. °Pre-diabetes °· This cannot be ignored. Before people develop Type 2 diabetes, they almost always have "pre-diabetes." This is when your blood glucose levels are higher than normal, but not yet high enough to be diagnosed as diabetes. Research has shown that some long-term damage to the body, especially the heart and circulatory system, may already be occurring during pre-diabetes. If you take action to manage your blood glucose when you have  pre-diabetes, you may delay or prevent Type 2 diabetes from developing. °Stress °· If you have diabetes, you may be "diet" controlled or on oral medications or insulin to control your diabetes. However, you may find that your blood glucose is higher than usual in the hospital whether you have diabetes or not. This is often referred to as "stress hyperglycemia." Stress can elevate your blood glucose. This happens because of hormones put out by the body during times of stress. If stress has been the cause of your high blood glucose, it can be followed regularly by your caregiver. That way he/she can make sure your hyperglycemia does not continue to get worse or progress to diabetes. °Steroids °· Steroids are medications that act on the infection fighting system (immune system) to block inflammation or infection. One side effect can be a rise in blood glucose. Most people can produce enough extra insulin to allow for this rise, but for those who cannot, steroids make blood glucose levels go even higher. It is not unusual for steroid treatments to "uncover" diabetes that   is developing. It is not always possible to determine if the hyperglycemia will go away after the steroids are stopped. A special blood test called an A1c is sometimes done to determine if your blood glucose was elevated before the steroids were started. °SYMPTOMS °· Thirsty. °· Frequent urination. °· Dry mouth. °· Blurred vision. °· Tired or fatigue. °· Weakness. °· Sleepy. °· Tingling in feet or leg. °DIAGNOSIS  °Diagnosis is made by monitoring blood glucose in one or all of the following ways: °· A1c test. This is a chemical found in your blood. °· Fingerstick blood glucose monitoring. °· Laboratory results. °TREATMENT  °First, knowing the cause of the hyperglycemia is important before the hyperglycemia can be treated. Treatment may include, but is not be limited to: °· Education. °· Change or adjustment in medications. °· Change or adjustment in  meal plan. °· Treatment for an illness, infection, etc. °· More frequent blood glucose monitoring. °· Change in exercise plan. °· Decreasing or stopping steroids. °· Lifestyle changes. °HOME CARE INSTRUCTIONS  °· Test your blood glucose as directed. °· Exercise regularly. Your caregiver will give you instructions about exercise. Pre-diabetes or diabetes which comes on with stress is helped by exercising. °· Eat wholesome, balanced meals. Eat often and at regular, fixed times. Your caregiver or nutritionist will give you a meal plan to guide your sugar intake. °· Being at an ideal weight is important. If needed, losing as little as 10 to 15 pounds may help improve blood glucose levels. °SEEK MEDICAL CARE IF:  °· You have questions about medicine, activity, or diet. °· You continue to have symptoms (problems such as increased thirst, urination, or weight gain). °SEEK IMMEDIATE MEDICAL CARE IF:  °· You are vomiting or have diarrhea. °· Your breath smells fruity. °· You are breathing faster or slower. °· You are very sleepy or incoherent. °· You have numbness, tingling, or pain in your feet or hands. °· You have chest pain. °· Your symptoms get worse even though you have been following your caregiver's orders. °· If you have any other questions or concerns. °Document Released: 08/16/2000 Document Revised: 05/15/2011 Document Reviewed: 06/19/2011 °ExitCare® Patient Information ©2015 [employer]. This information is not intended to replace advice given to you by your health care provider. Make sure you discuss any questions you have with your health care provider. ° °

## 2014-10-01 NOTE — ED Provider Notes (Signed)
CSN: [id number]     Arrival date & time 10/01/14  1721 History   First MD Initiated Contact with Patient 10/01/14 2005     Chief Complaint  Patient presents with  . Abdominal Pain     (Consider location/radiation/quality/duration/timing/severity/associated sxs/prior Treatment) Patient is a 39 y.o. female presenting with abdominal pain. The history is provided by the patient. No language interpreter was used.  Abdominal Pain Pain location:  Generalized Pain radiates to:  Does not radiate Associated symptoms: diarrhea and nausea   Associated symptoms: no chills, no dysuria, no fever, no hematuria and no vomiting   Associated symptoms comment:  Here for evaluation of diffuse abdominal pain, nausea without vomiting, diarrhea (non-bloody) and lightheadedness without syncope. She feels it is related to her uncontrolled T2DM that has been unmedicated for 2 weeks while out of her Metformin and Lantus insulin. She was able to fill her prescriptions yesterday but was concerned that her CBG remained elevated in the setting of persistent abdominal pain. No fever. She is having urinary frequency without dysuria or hematuria.    Past Medical History  Diagnosis Date  . Allergy   . Bipolar 1 disorder     Sees Dr. Ledon Snare, psychology,  [phone]  . Fatigue   . Anemia   . Diabetes mellitus without complication    Past Surgical History  Procedure Laterality Date  . Abdominal hysterectomy     No family history on file. History  Substance Use Topics  . Smoking status: Never Smoker   . Smokeless tobacco: Never Used  . Alcohol Use: Yes     Comment: occ   OB History    No data available     Review of Systems  Constitutional: Negative for fever and chills.  HENT: Negative.   Respiratory: Negative.   Cardiovascular: Negative.   Gastrointestinal: Positive for nausea, abdominal pain and diarrhea. Negative for vomiting and blood in stool.  Endocrine: Positive for polydipsia and polyuria.   Genitourinary: Positive for frequency. Negative for dysuria and hematuria.  Musculoskeletal: Negative.  Negative for myalgias.  Skin: Negative.   Neurological: Positive for weakness and light-headedness. Negative for syncope.      Allergies  Hydromorphone hcl; Meperidine hcl; Morphine; Lisinopril; Sumatriptan; and Zolmitriptan  Home Medications   Prior to Admission medications   Medication Sig Start Date End Date Taking? Authorizing Provider  Calcium Carbonate-Vitamin D (CALCIUM 600/VITAMIN D) 600-400 MG-UNIT per chew tablet Chew 2 tablets by mouth daily.     Historical Provider, MD  diphenhydrAMINE (BENADRYL) 12.5 MG/5ML liquid Take 12.5 mg by mouth daily as needed.    Historical Provider, MD  Dulaglutide (TRULICITY) 0.75 MG/0.5ML SOPN Inject 0.75mg  once weekly. 07/02/14   Moses Manners, MD  ferrous sulfate (ECK FERROUS SULFATE) 325 (65 FE) MG tablet Take 325 mg by mouth daily.     Historical Provider, MD  ibuprofen (ADVIL,MOTRIN) 800 MG tablet Take 1 tablet (800 mg total) by mouth every 8 (eight) hours as needed. 09/18/13   Charlestine Night, PA-C  Insulin Glargine (LANTUS SOLOSTAR) 100 UNIT/ML Solostar Pen Inject 20 Units into the skin daily at 10 pm. 07/03/14   Joanna Puff, MD  lamoTRIgine (LAMICTAL) 100 MG tablet Take 2 tablets (200 mg total) by mouth daily. 03/16/14   Joanna Puff, MD  metFORMIN (GLUCOPHAGE-XR) 500 MG 24 hr tablet Take 3 tablets (1,500 mg total) by mouth at bedtime. 03/24/14   Joanna Puff, MD  pantoprazole (PROTONIX) 40 MG tablet Take 1 tablet (40 mg total)  by mouth daily. 07/10/14   Joanna Puff, MD  promethazine (PHENERGAN) 25 MG tablet Take 1 tablet (25 mg total) by mouth every 6 (six) hours as needed for nausea or vomiting. 10/03/13   Oswaldo Conroy, PA-C  venlafaxine XR (EFFEXOR-XR) 150 MG 24 hr capsule Take 1 capsule (150 mg total) by mouth daily with breakfast. 08/26/14   Joanna Puff, MD   BP 115/78 mmHg  Pulse 91  Temp(Src) 98.3 F  (36.8 C) (Oral)  Resp 18  SpO2 100% Physical Exam  Constitutional: She is oriented to person, place, and time. She appears well-developed and well-nourished.  HENT:  Head: Normocephalic.  Neck: Normal range of motion. Neck supple.  Cardiovascular: Normal rate and regular rhythm.   Pulmonary/Chest: Effort normal and breath sounds normal.  Abdominal: Soft. Bowel sounds are normal. There is tenderness. There is no rebound and no guarding.  Diffuse abdominal tenderness.   Musculoskeletal: Normal range of motion.  Neurological: She is alert and oriented to person, place, and time.  Skin: Skin is warm and dry. No rash noted.  Psychiatric: She has a normal mood and affect.    ED Course  Procedures (including critical care time) Labs Review Labs Reviewed  LIPASE, BLOOD - Abnormal; Notable for the following:    Lipase 21 (*)    All other components within normal limits  COMPREHENSIVE METABOLIC PANEL - Abnormal; Notable for the following:    Glucose, Bld 288 (*)    All other components within normal limits  CBC  URINALYSIS, ROUTINE W REFLEX MICROSCOPIC (NOT AT [facility])    Imaging Review No results found.   EKG Interpretation None      MDM   Final diagnoses:  None    1. Abdominal pain 2. Hyperglycemia  Review of chart shows patient's A1c in April of this year was 7.2 showing usually controlled disease.   CBG initially of 288, improved with fluids to 221. The patient has no evidence of acidosis, normal anion gap, and mild hyperglycemia in the setting of having just restarted her diabetic medications. She has close follow up available in the community Patrcia Dolly [facility]). She is felt stable for discharge.   Elpidio Anis, PA-C 10/01/14 2151  Linwood Dibbles, MD 10/01/14 2159

## 2014-10-01 NOTE — ED Notes (Signed)
Pt reports to the ED for eval of abd pain, hyperglycemia, and dizziness. Pt reports she was out of her medication x  2 weeks. Pt reports she is on Lantus and Meformin. She reports that she got them filled yesterday and she has taken them but they have not been helping get her blood sugars under control. Pt reports her sugars have been running in between the 200s-400s. Pt reports nausea and diarrhea but denies any vomiting. Pt A&Ox4, resp e/u, and skin warm and dry.

## 2014-10-01 NOTE — ED Notes (Signed)
CBG 221 

## 2014-10-02 ENCOUNTER — Telehealth: Payer: Self-pay | Admitting: Family Medicine

## 2014-10-02 NOTE — Telephone Encounter (Signed)
Pt has been in the hospital recently due to her uncontrolled diabetes, she is scheduled to see Dr. Raymondo Band next Friday but is still having problems with controlling her sugar. Wants to know what she should do, pt is trying to avoid going back to the hospital.

## 2014-10-02 NOTE — Telephone Encounter (Signed)
Left message on voicemail for patient to return call. 

## 2014-10-05 NOTE — Telephone Encounter (Signed)
Will forward to PCP.  Quinntin Malter L, RN  

## 2014-10-05 NOTE — Telephone Encounter (Signed)
Patient hasn't been able to get CBGs under control (497-166) this past week. Is supposed to be taking Lantus 20u daily however has been taking some Lantus intermittently throughout the day. States she feels there's some days she may have taken 40-50u but isn't sure.  CBG was 160 this AM fasting. Hasn't taken any more CBGs b/c she's at work. Has taken Lantus 20u already today. No N/V or abdominal pain. No illness currently.   - Discussed that Lantus is a long acting insulin and my concerns of it having a stacking effect if she continues to take it intermittently throughout the day.  - Pt to take CBGs at home tonight, if >250 will take an additional 5u Lantus.  - Pt to keep track of her CBGs. If consistently high will call office with #s instead of taking more Lantus. - If improved, will wait and keep records for Dr. Raymondo Band on Friday.  - FYI to Dr. Dorothea Ogle, MD [facility] Family Medicine Resident  10/05/2014, 5:40 PM

## 2014-10-09 ENCOUNTER — Encounter: Payer: Self-pay | Admitting: Pharmacist

## 2014-10-09 ENCOUNTER — Ambulatory Visit (INDEPENDENT_AMBULATORY_CARE_PROVIDER_SITE_OTHER): Payer: Self-pay | Admitting: Pharmacist

## 2014-10-09 VITALS — BP 133/72 | HR 70 | Ht 68.98 in | Wt 334.5 lb

## 2014-10-09 DIAGNOSIS — E119 Type 2 diabetes mellitus without complications: Secondary | ICD-10-CM

## 2014-10-09 DIAGNOSIS — F319 Bipolar disorder, unspecified: Secondary | ICD-10-CM

## 2014-10-09 NOTE — Assessment & Plan Note (Signed)
Diabetes currently uncontrolled. Denies hypoglycemic events and is able to verbalize appropriate hypoglycemia management plan. Denies adherence with medication as stated above. Control is suboptimal likely due to depression and medication non-compliance. Increased dose of basal insulin Lantus (insulin glargine). Patient will start 25 units every morning. Plans to obtain her trulicity in the next week or two and will start 0.75mg  SQ once weekly. Will titrate trulicity based on tolerance and will continue to push patient toward continued weight loss. Written patient instructions provided.

## 2014-10-09 NOTE — Patient Instructions (Signed)
Thanks for visiting!  Start taking 25 units of Lantus every morning unless blood sugar below 100. Begin taking your Trulicity as soon as you are able to pick it up.   Follow up with Dr. Leonides Schanz when first available.

## 2014-10-09 NOTE — Progress Notes (Signed)
   Subjective:    Patient ID: [name] [name], female    DOB: 25-Mar-1975, 39 y.o.   MRN: [id number]  HPI Reviewed: Agree with Dr. Macky Lower documentation and management.    Review of Systems     Objective:   Physical Exam        Assessment & Plan:

## 2014-10-09 NOTE — Assessment & Plan Note (Signed)
Patient complains of fatigue and lethargy. Concern for poorly controlled depression. Recent dosage increase of effexor to  daily. Possible consideration of stimulant therapy raised by Dr. Ledon Snare (mental health provider). Could also consider add-on therapy with buproprion in the future.

## 2014-10-09 NOTE — Progress Notes (Signed)
S:    Patient arrives in good spirits with pleasant demeanor. Presents for diabetes follow up.  Patient reports having history of Diabetes since the year of 2011.   Patient recently acquired a Lantus refill after an ED visit on 7/28 for hyperglycemia/abdominal pain. Has been stacking her lantus based on her blood sugar readings. Reports use of 20 units, possibly up to 40 units. Still has been unable to pick her Trulicity (filled out paperwork approx one month ago). Current diabetes medications include lantus, and metformin.  Patient denies hypoglycemic events.  Patient reported exercise habits: Minimal exercise. Patient notes increased fatigue leading to less of a desire. Lethargy and depressed mood given as reasoning. Of note, patient recently prescribed a dose increase of effexor due to having more depression symptoms.    Patient reports nocturia; up to 4 times nightly.   O:  . Lab Results  Component Value Date   HGBA1C 7.6 07/02/2014     BG last 7 days since Lantus restart: 150-220  A/P: Diabetes currently uncontrolled. Denies hypoglycemic events and is able to verbalize appropriate hypoglycemia management plan. Denies adherence with medication as stated above. Control is suboptimal likely due to depression and medication non-compliance. Increased dose of basal insulin Lantus (insulin glargine). Patient will start 25 units every morning. Plans to obtain her trulicity in the next week or two and will start 0.75mg  SQ once weekly. Will titrate trulicity based on tolerance and will continue to push patient toward continued weight loss. Written patient instructions provided.  Patient complains of fatigue and lethargy. Concern for poorly controlled depression. Recent dosage increase of effexor to  daily. Possible consideration of stimulant therapy raised by Dr. Ledon Snare (mental health provider). Could also consider add-on therapy with buproprion in the future.    Follow up with Dr.  Leonides Schanz at first available appointment. Total time in face to face counseling 30 minutes.  Patient seen with Sherle Poe, PharmD Resident.

## 2014-10-20 ENCOUNTER — Ambulatory Visit: Payer: Self-pay | Admitting: Family Medicine

## 2014-10-22 ENCOUNTER — Other Ambulatory Visit: Payer: Self-pay | Admitting: *Deleted

## 2014-10-22 MED ORDER — IBUPROFEN 800 MG PO TABS: 800.0000 mg | ORAL_TABLET | Freq: Three times a day (TID) | ORAL | Status: DC | PRN

## 2014-10-28 ENCOUNTER — Other Ambulatory Visit: Payer: Self-pay | Admitting: *Deleted

## 2014-10-29 MED ORDER — LAMOTRIGINE 100 MG PO TABS: 200.0000 mg | ORAL_TABLET | Freq: Every day | ORAL | Status: DC

## 2014-10-29 NOTE — Telephone Encounter (Signed)
Lamictal ordered and "faxed."  Joanna Puff, MD [facility] Family Medicine Resident  10/29/2014, 6:57 AM

## 2014-11-24 ENCOUNTER — Encounter: Payer: Self-pay | Admitting: Family Medicine

## 2014-11-24 ENCOUNTER — Ambulatory Visit (INDEPENDENT_AMBULATORY_CARE_PROVIDER_SITE_OTHER): Payer: Self-pay | Admitting: Family Medicine

## 2014-11-24 VITALS — BP 125/68 | HR 77 | Wt 333.0 lb

## 2014-11-24 DIAGNOSIS — G4719 Other hypersomnia: Secondary | ICD-10-CM

## 2014-11-24 DIAGNOSIS — F319 Bipolar disorder, unspecified: Secondary | ICD-10-CM

## 2014-11-24 DIAGNOSIS — G4710 Hypersomnia, unspecified: Secondary | ICD-10-CM

## 2014-11-24 DIAGNOSIS — F3130 Bipolar disorder, current episode depressed, mild or moderate severity, unspecified: Secondary | ICD-10-CM

## 2014-11-24 MED ORDER — BUPROPION HCL ER (XL) 150 MG PO TB24: 150.0000 mg | ORAL_TABLET | Freq: Every day | ORAL | Status: DC

## 2014-11-24 NOTE — Progress Notes (Signed)
Patient ID: [name] [name], female   DOB: March 23, 1975, 39 y.o.   MRN: [id number]    Subjective: [id number] management HPI: Patient is a 39 y.o. female with a past medical history of bipolar disease and DM among other things presenting to clinic today for medication management .  Bipolar: We increased the Effexor to  daily- she 's not feeling depressed as she was previously. No lows or highs. In the past, her "highs" are being unreasonable, and "not rational," and almost "obesseive."  She is having problems with daytime sleepiness. She's wearing the CPAP without significant improvement- it was better in the beginning but not useful in the last 2 years. Notes she recently had another sleep study but forgot to bring the results. She wakes up tired.  No issues falling asleep at night. She sleeps from 9pm until 5am, with 3 night time awakenings to urinate (CBGs 175-156, denies any hyperglycemic episodes)    She's not focused, tired, and forgetful. She naps 30 minutes (while the kids are sleeping). Per the patient, Dr. Ledon Snare thought she should go on Provigil for daytime tiredness. Dr. Raymondo Band had suggested Wellbutrin as it would help with energy and weight loss. The patient notes that she felt like there was a reason Dr. Ledon Snare, her psychologist, did not like this option. We still do not have any records from Dr. Jennette Banker office.    Social History: non-smoker   Health Maintenance: Declined flu vaccine, needs pap smear  ROS: All other systems reviewed and are negative.  Past Medical History Patient Active Problem List   Diagnosis Date Noted  . Abdominal pain, left lower quadrant 07/13/2014  . Groin pain, chronic, right 07/13/2014  . Lung nodule seen on imaging study 09/24/2013  . Chest pain 09/24/2013  . Diabetes mellitus 07/11/2012  . Pollen allergies 07/11/2012  . 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 Meralgia paresthetica of left side 08/18/2010  . Fatigue 04/20/2010  . INTERSTITIAL  CYSTITIS 07/22/2007  . MENOPAUSE, SURGICAL 04/24/2007  . HIATAL HERNIA 11/20/2006  . Hypercholesterolemia 05/03/2006  . OBESITY, NOS 05/03/2006  . ANEMIA, IRON DEFICIENCY, UNSPEC. 05/03/2006  . BIPOLAR DISORDER 05/03/2006  . ANXIETY 05/03/2006  . MIGRAINE, UNSPEC., W/O INTRACTABLE MIGRAINE 05/03/2006  . HYPERTENSION, BENIGN SYSTEMIC 05/03/2006  . PEPTIC ULCER DIS., UNSPEC. W/O OBSTRUCTION 05/03/2006    Medications- reviewed and updated Current Outpatient Prescriptions  Medication Sig Dispense Refill  . buPROPion (WELLBUTRIN XL) 150 MG 24 hr tablet Take 1 tablet (150 mg total) by mouth daily. 30 tablet 1  . Calcium Carbonate-Vitamin D (CALCIUM 600/VITAMIN D) 600-400 MG-UNIT per chew tablet Chew 2 tablets by mouth daily.     . diphenhydrAMINE (BENADRYL) 12.5 MG/5ML liquid Take 12.5 mg by mouth daily as needed.    . Dulaglutide (TRULICITY) 0.75 MG/0.5ML SOPN Inject 0.75mg  once weekly. (Patient not taking: Reported on 10/09/2014) 4 pen 11  . ferrous sulfate (ECK FERROUS SULFATE) 325 (65 FE) MG tablet Take 325 mg by mouth 2 (two) times daily with a meal.     . ibuprofen (ADVIL,MOTRIN) 800 MG tablet Take 1 tablet (800 mg total) by mouth every 8 (eight) hours as needed. 30 tablet 0  . Insulin Glargine (LANTUS SOLOSTAR) 100 UNIT/ML Solostar Pen Inject 20 Units into the skin daily at 10 pm. (Patient taking differently: Inject 20-25 Units into the skin every morning. ) 15 mL 11  . lamoTRIgine (LAMICTAL) 100 MG tablet Take 2 tablets (200 mg total) by mouth daily. 60 tablet 3  . metFORMIN (GLUCOPHAGE-XR) 500 MG  24 hr tablet Take 3 tablets (1,500 mg total) by mouth at bedtime. 90 tablet 4  . promethazine (PHENERGAN) 25 MG tablet Take 1 tablet (25 mg total) by mouth every 6 (six) hours as needed for nausea or vomiting. (Patient not taking: Reported on 10/09/2014) 30 tablet 0  . venlafaxine XR (EFFEXOR-XR) 150 MG 24 hr capsule Take 1 capsule (150 mg total) by mouth daily with breakfast. 30 capsule 3   No  current facility-administered medications for this visit.    Objective: Office vital signs reviewed. BP 125/68 mmHg  Pulse 77  Wt 333 lb (151.048 kg)   Physical Examination:  General: Awake, alert, obese, in NAD Cardio: RRR, no m/r/g noted. No pitting edema  Pulm: No increased WOB.  CTAB, without wheezes, rhonchi or crackles noted.  Psych: stated mood "good." Appropriate affect. No SI, HI, or AVH.  Assessment/Plan: BIPOLAR DISORDER Depression appears to be improved on Effexor. Main concern now is daytime fatigue. Noted previous suggestion of a stimulant. Wellbutrin may be a good option: out of most antidepressants it is one of the least likely to induce mania, especially at lower dosages. Additionally, the patient is on a mood stabilizer as well. Would like to avoid a controlled substance in this patient if possible. Attempted to contact Dr.McKnight's office to discuss his concerns however it went to voicemail. Discussed this with Dr. Raymondo Band, PharmD - Start Wellbutrin  QD - Discussed possible side effects: patient very aware of when she may be becoming manic and notes she would discontinue the medication and call our office.  - Will attempt to ger records from Dr. Ledon Snare - Close follow up in approximately 2-4 weeks      No orders of the defined types were placed in this encounter.    Meds ordered this encounter  Medications  . buPROPion (WELLBUTRIN XL) 150 MG 24 hr tablet    Sig: Take 1 tablet (150 mg total) by mouth daily.    Dispense:  30 tablet    Refill:  1    Joanna Puff PGY-2, [facility] Family Medicine

## 2014-11-24 NOTE — Patient Instructions (Signed)
Please start taking Wellbutrin . The chance of inducing mania is much less than with other medications. Please be on the look out for symptoms of mania and discontinue the medication AND call our office.  Please have Dr. Ledon Snare fax over records.  If this medication does not seem to help, we can consider increasing, then changing medications.

## 2014-11-26 ENCOUNTER — Telehealth: Payer: Self-pay | Admitting: Family Medicine

## 2014-11-26 NOTE — Assessment & Plan Note (Addendum)
Depression appears to be improved on Effexor. Main concern now is daytime fatigue. Noted previous suggestion of a stimulant. Wellbutrin may be a good option: out of most antidepressants it is one of the least likely to induce mania, especially at lower dosages. Additionally, the patient is on a mood stabilizer as well. Would like to avoid a controlled substance in this patient if possible. Attempted to contact Dr.McKnight's office to discuss his concerns however it went to voicemail. Discussed this with Dr. Raymondo Band, PharmD - Start Wellbutrin  QD - Discussed possible side effects: patient very aware of when she may be becoming manic and notes she would discontinue the medication and call our office.  - Will attempt to ger records from Dr. Ledon Snare - Close follow up in approximately 2 weeks or sooner as needed.

## 2014-11-26 NOTE — Telephone Encounter (Signed)
Received a page. Dr. Ledon Snare would like me to contact his office between 2-4pm to discuss medication regimen. I called and left a message stating I was post a 28hr call and would be unable to contact him at that time. To contact our office to find another time tomorrow so we can discuss regimen.   Joanna Puff, MD [facility] Family Medicine Resident  11/26/2014, 11:39 AM

## 2014-12-10 ENCOUNTER — Telehealth: Payer: Self-pay | Admitting: Pharmacist

## 2014-12-10 ENCOUNTER — Other Ambulatory Visit: Payer: Self-pay | Admitting: Pharmacist

## 2014-12-10 NOTE — Progress Notes (Signed)
Received notice that patient received Trulicity last week and brought it into clinic while I was out of the office.  Patient received training on how to use Trulicity pens by pharmacy resident.  Patient was informed to expect a call with instructions on initiation of Trulicity.   I made three unsuccessful call attempts to [name] [name] to instruct her on initiation of Trulicity.  A HIPAA compliant voice message was left for the patient.

## 2014-12-10 NOTE — Telephone Encounter (Signed)
Received notice that patient received Trulicity last week and brought it into clinic while I was out of the office.  Patient received training on how to use Trulicity pens by pharmacy resident.  Patient was informed to expect a call with instructions on initiation of Trulicity.   I made three unsuccessful call attempts to [name] [name] to instruct her on initiation of Trulicity.  A HIPAA compliant voice message was left for the patient.       

## 2014-12-18 ENCOUNTER — Ambulatory Visit: Payer: Self-pay | Admitting: Family Medicine

## 2015-02-03 ENCOUNTER — Ambulatory Visit (INDEPENDENT_AMBULATORY_CARE_PROVIDER_SITE_OTHER): Payer: Self-pay | Admitting: Family Medicine

## 2015-02-03 ENCOUNTER — Encounter: Payer: Self-pay | Admitting: Family Medicine

## 2015-02-03 VITALS — BP 122/80 | HR 80 | Temp 98.6°F | Ht 68.0 in | Wt 331.0 lb

## 2015-02-03 DIAGNOSIS — S61219A Laceration without foreign body of unspecified finger without damage to nail, initial encounter: Secondary | ICD-10-CM

## 2015-02-03 NOTE — Patient Instructions (Addendum)
Use bacitracin for antibiotic effects (neosporin can cause irritation in some people) and keep the area covered. Make sure you give us a call at [phone] if it becomes red, swollen, or warm or has a discharge.   Otherwise, follow up with your primary doctor.

## 2015-02-03 NOTE — Progress Notes (Signed)
Subjective: [name] [name] is a diabetic [age] y.o. female with a finger wound.  She reports suffering a paper cut type injury to her left index finger tip when opening a bag 3 days ago. There was controlled bleeding and she's been keeping it covered without ointment since then. She's nervous that it hasn't healed yet though it hasn't gotten worse. There is no pain reported as she has no feeling in the tip of that finger due to carpal tunnel syndrome.   - ROS: Denies fevers, redness, swelling, drainage, history of nonhealing wounds or immunodeficiency.  - Non-smoker  Objective: BP 122/80 mmHg  Pulse 80  Temp(Src) 98.6 F (37 C) (Oral)  Ht 5\' 8"  (1.727 m)  Wt 331 lb (150.141 kg)  BMI 50.34 kg/m2  SpO2 99% Gen: Obese well-appearing 39 y.o. female in no distress Left hand: Small (< 1 cm) shallow linear laceration on the lateral nail fold not involving the matrix or tendons without erythema, warmth, swelling or discharge. Full AROM. No epitrochlear nodes. HbA1c: 7.6% on 07/02/2014.   Assessment/Plan: [name] [name] is a 39 y.o. female here for diabetic finger wound.  Finger laceration: in somewhat uncontrolled diabetic nonsmoker without signs of infection or surgical intervention. It is shallow and short enough to heal on its own. Advised that she keep the wound covered, use bacitracin (provided), and call the clinic if it develops significant redness, swelling, discharge, or warmth.

## 2015-02-22 ENCOUNTER — Other Ambulatory Visit: Payer: Self-pay | Admitting: *Deleted

## 2015-02-22 MED ORDER — VENLAFAXINE HCL ER 150 MG PO CP24: 150.0000 mg | ORAL_CAPSULE | Freq: Every day | ORAL | Status: DC

## 2015-03-19 ENCOUNTER — Telehealth: Payer: Self-pay | Admitting: Family Medicine

## 2015-03-19 NOTE — Telephone Encounter (Signed)
Pt instructed to contact Dr. Raymondo BandKoval if she were to experience any reactions or side effects from her medication, Trulicity. The side effects that she is currently experiencing is nausea, vomiting, mid-section pain, constipation, and lower back pain. She would like to know what can be done. [name] [name], ASA

## 2015-03-19 NOTE — Telephone Encounter (Signed)
Called with N/V and states it is problematic with the Trulicity.   She would like to stop this and I agreed.   We planned f/u in the next few days with me to discuss other options.

## 2015-03-25 ENCOUNTER — Other Ambulatory Visit: Payer: Self-pay | Admitting: *Deleted

## 2015-03-25 MED ORDER — LAMOTRIGINE 100 MG PO TABS: 200.0000 mg | ORAL_TABLET | Freq: Every day | ORAL | Status: DC

## 2015-03-25 NOTE — Telephone Encounter (Signed)
Refill on Lamictal "faxed." Please have patient f/u with me in the next month.  Thanks, Joanna Puff, MD [facility] Family Medicine Resident  03/25/2015, 12:18 PM

## 2015-03-30 NOTE — Telephone Encounter (Signed)
Left message on voicemail informing of message from MD. 

## 2015-04-21 ENCOUNTER — Telehealth: Payer: Self-pay | Admitting: Family Medicine

## 2015-04-21 ENCOUNTER — Emergency Department (HOSPITAL_COMMUNITY)
Admission: EM | Admit: 2015-04-21 | Discharge: 2015-04-21 | Disposition: A | Discharge status: Home / Self Care | Payer: Self-pay | Source: Home / Self Care

## 2015-04-21 ENCOUNTER — Encounter (HOSPITAL_COMMUNITY): Payer: Self-pay | Admitting: *Deleted

## 2015-04-21 DIAGNOSIS — M76891 Other specified enthesopathies of right lower limb, excluding foot: Secondary | ICD-10-CM

## 2015-04-21 DIAGNOSIS — M76821 Posterior tibial tendinitis, right leg: Secondary | ICD-10-CM

## 2015-04-21 MED ORDER — MELOXICAM 7.5 MG PO TABS: 7.5000 mg | ORAL_TABLET | Freq: Two times a day (BID) | ORAL | Status: DC

## 2015-04-21 NOTE — Telephone Encounter (Signed)
Called pt and asked if she wanted to make an appt and she declined. She stated that "she doesn't have time to come see dr Leonides Schanz, so she is going to go to the urgent care". Deseree Bruna Potter, CMA

## 2015-04-21 NOTE — Telephone Encounter (Signed)
Having pain in back of knee-radiating to foot and then to hip.  What could this be?

## 2015-04-21 NOTE — ED Notes (Addendum)
Pt  Has  Pain  r  Leg    Behind  Knee    With  Pain  Radiating  Down  r  Leg        Since          Monday   May    13           denys  Any     Injury          Ambulates  slowley  With a  Limp        deniies     Any  specefic  Injury

## 2015-04-21 NOTE — ED Provider Notes (Signed)
CSN: [id number]     Arrival date & time 04/21/15  1939 History   None    Chief Complaint  Patient presents with  . Leg Pain   (Consider location/radiation/quality/duration/timing/severity/associated sxs/prior Treatment) Patient is a 40 y.o. female presenting with leg pain. The history is provided by the patient and a parent.  Leg Pain Location:  Knee Time since incident:  1 week Injury: no   Knee location:  R knee Pain details:    Quality:  Burning   Radiates to:  R leg   Severity:  Mild   Onset quality:  Gradual   Progression:  Worsening (has been walking 46miles/day for past sev weeks prior to onset of sx.) Chronicity:  New Dislocation: no   Prior injury to area:  No Relieved by:  None tried   Past Medical History  Diagnosis Date  . Allergy   . Bipolar 1 disorder [facility])     Sees Dr. Ledon Snare, psychology,  [phone]  . Fatigue   . Anemia   . Diabetes mellitus without complication [facility])    Past Surgical History  Procedure Laterality Date  . Abdominal hysterectomy     History reviewed. No pertinent family history. Social History  Substance Use Topics  . Smoking status: Never Smoker   . Smokeless tobacco: Never Used  . Alcohol Use: 0.0 oz/week    0 Standard drinks or equivalent per week     Comment: occ   OB History    No data available     Review of Systems  Musculoskeletal: Positive for myalgias and gait problem. Negative for joint swelling.  Skin: Negative.   All other systems reviewed and are negative.   Allergies  Hydromorphone hcl; Meperidine hcl; Morphine; Lisinopril; Sumatriptan; and Zolmitriptan  Home Medications   Prior to Admission medications   Medication Sig Start Date End Date Taking? Authorizing Provider  buPROPion (WELLBUTRIN XL) 150 MG 24 hr tablet Take 1 tablet (150 mg total) by mouth daily. 11/24/14   Joanna Puff, MD  Calcium Carbonate-Vitamin D (CALCIUM 600/VITAMIN D) 600-400 MG-UNIT per chew tablet Chew 2 tablets by mouth  daily.     Historical Provider, MD  diphenhydrAMINE (BENADRYL) 12.5 MG/5ML liquid Take 12.5 mg by mouth daily as needed.    Historical Provider, MD  ferrous sulfate (ECK FERROUS SULFATE) 325 (65 FE) MG tablet Take 325 mg by mouth 2 (two) times daily with a meal.     Historical Provider, MD  ibuprofen (ADVIL,MOTRIN) 800 MG tablet Take 1 tablet (800 mg total) by mouth every 8 (eight) hours as needed. 10/22/14   Erasmo Downer, MD  Insulin Glargine (LANTUS SOLOSTAR) 100 UNIT/ML Solostar Pen Inject 20 Units into the skin daily at 10 pm. Patient taking differently: Inject 20-25 Units into the skin every morning.  07/03/14   Joanna Puff, MD  lamoTRIgine (LAMICTAL) 100 MG tablet Take 2 tablets (200 mg total) by mouth daily. 03/25/15   Joanna Puff, MD  metFORMIN (GLUCOPHAGE-XR) 500 MG 24 hr tablet Take 3 tablets (1,500 mg total) by mouth at bedtime. 03/24/14   Joanna Puff, MD  promethazine (PHENERGAN) 25 MG tablet Take 1 tablet (25 mg total) by mouth every 6 (six) hours as needed for nausea or vomiting. Patient not taking: Reported on 10/09/2014 10/03/13   Oswaldo Conroy, PA-C  venlafaxine XR (EFFEXOR-XR) 150 MG 24 hr capsule Take 1 capsule (150 mg total) by mouth daily with breakfast. 02/22/15   Joanna Puff, MD   Meds  Ordered and Administered this Visit  Medications - No data to display  BP 134/78 mmHg  Pulse 78  Temp(Src) 98.6 F (37 C) (Oral)  Resp 18  SpO2 99% No data found.   Physical Exam  Constitutional: She is oriented to person, place, and time. She appears well-developed and well-nourished. She appears distressed.  Musculoskeletal: She exhibits tenderness.       Legs: Neurological: She is alert and oriented to person, place, and time.  Skin: Skin is warm and dry.  Nursing note and vitals reviewed.   ED Course  Procedures (including critical care time)  Labs Review Labs Reviewed - No data to display  Imaging Review No results found.   Visual Acuity  Review  Right Eye Distance:   Left Eye Distance:   Bilateral Distance:    Right Eye Near:   Left Eye Near:    Bilateral Near:         MDM  No diagnosis found.  Meds ordered this encounter  Medications  . meloxicam (MOBIC) 7.5 MG tablet    Sig: Take 1 tablet (7.5 mg total) by mouth 2 (two) times daily after a meal.    Dispense:  30 tablet    Refill:  1      Linna Hoff, MD 04/21/15 2053

## 2015-04-21 NOTE — Discharge Instructions (Signed)
Use brace, ice, medicine and see dr Charlett Blake for recheck.

## 2015-05-11 ENCOUNTER — Other Ambulatory Visit: Payer: Self-pay | Admitting: *Deleted

## 2015-05-12 MED ORDER — LAMOTRIGINE 100 MG PO TABS: 200.0000 mg | ORAL_TABLET | Freq: Every day | ORAL | Status: DC

## 2015-06-17 ENCOUNTER — Telehealth: Payer: Self-pay | Admitting: Family Medicine

## 2015-06-17 NOTE — Telephone Encounter (Signed)
Patient says that her sugars are running high and wanted to speak with you concerning what she needs to do.

## 2015-06-17 NOTE — Telephone Encounter (Signed)
Attempted call back X 2. first left message requested call back.   second attempt encouraged patient to call our physician after hours line if she was in need of help as I would be out of the office for the next 3 days.

## 2015-06-28 ENCOUNTER — Other Ambulatory Visit: Payer: Self-pay | Admitting: *Deleted

## 2015-06-28 MED ORDER — VENLAFAXINE HCL ER 150 MG PO CP24: 150.0000 mg | ORAL_CAPSULE | Freq: Every day | ORAL | Status: DC

## 2015-09-13 ENCOUNTER — Ambulatory Visit: Payer: Self-pay | Admitting: Family Medicine

## 2015-09-14 ENCOUNTER — Encounter (HOSPITAL_COMMUNITY): Payer: Self-pay | Admitting: Emergency Medicine

## 2015-09-14 ENCOUNTER — Emergency Department (HOSPITAL_COMMUNITY): Payer: Self-pay

## 2015-09-14 ENCOUNTER — Encounter (HOSPITAL_COMMUNITY)
Admission: EM | Disposition: A | Discharge status: Home / Self Care | Payer: Self-pay | Source: Home / Self Care | Attending: Family Medicine

## 2015-09-14 ENCOUNTER — Observation Stay (HOSPITAL_COMMUNITY): Payer: Self-pay | Admitting: Anesthesiology

## 2015-09-14 ENCOUNTER — Ambulatory Visit (HOSPITAL_COMMUNITY)
Admission: EM | Admit: 2015-09-14 | Discharge: 2015-09-14 | Disposition: A | Discharge status: Nursing Home (Includes ICF) | Payer: Self-pay | Attending: Family Medicine | Admitting: Family Medicine

## 2015-09-14 ENCOUNTER — Inpatient Hospital Stay (HOSPITAL_COMMUNITY)
Admission: EM | Admit: 2015-09-14 | Discharge: 2015-09-17 | DRG: 513 | Disposition: A | Discharge status: Home / Self Care | Payer: Self-pay | Attending: Family Medicine | Admitting: Family Medicine

## 2015-09-14 DIAGNOSIS — R52 Pain, unspecified: Secondary | ICD-10-CM | POA: Insufficient documentation

## 2015-09-14 DIAGNOSIS — E1165 Type 2 diabetes mellitus with hyperglycemia: Secondary | ICD-10-CM | POA: Diagnosis present

## 2015-09-14 DIAGNOSIS — Z6841 Body Mass Index (BMI) 40.0 and over, adult: Secondary | ICD-10-CM

## 2015-09-14 DIAGNOSIS — Z794 Long term (current) use of insulin: Secondary | ICD-10-CM

## 2015-09-14 DIAGNOSIS — E1140 Type 2 diabetes mellitus with diabetic neuropathy, unspecified: Secondary | ICD-10-CM | POA: Insufficient documentation

## 2015-09-14 DIAGNOSIS — E119 Type 2 diabetes mellitus without complications: Secondary | ICD-10-CM

## 2015-09-14 DIAGNOSIS — Z79899 Other long term (current) drug therapy: Secondary | ICD-10-CM

## 2015-09-14 DIAGNOSIS — F319 Bipolar disorder, unspecified: Secondary | ICD-10-CM | POA: Diagnosis present

## 2015-09-14 DIAGNOSIS — L02519 Cutaneous abscess of unspecified hand: Secondary | ICD-10-CM | POA: Diagnosis present

## 2015-09-14 DIAGNOSIS — L0390 Cellulitis, unspecified: Secondary | ICD-10-CM | POA: Diagnosis present

## 2015-09-14 DIAGNOSIS — G5600 Carpal tunnel syndrome, unspecified upper limb: Secondary | ICD-10-CM | POA: Diagnosis present

## 2015-09-14 DIAGNOSIS — I10 Essential (primary) hypertension: Secondary | ICD-10-CM | POA: Diagnosis present

## 2015-09-14 DIAGNOSIS — L02511 Cutaneous abscess of right hand: Secondary | ICD-10-CM | POA: Diagnosis present

## 2015-09-14 DIAGNOSIS — L089 Local infection of the skin and subcutaneous tissue, unspecified: Secondary | ICD-10-CM

## 2015-09-14 DIAGNOSIS — E669 Obesity, unspecified: Secondary | ICD-10-CM | POA: Diagnosis present

## 2015-09-14 DIAGNOSIS — M65141 Other infective (teno)synovitis, right hand: Principal | ICD-10-CM | POA: Diagnosis present

## 2015-09-14 DIAGNOSIS — L03011 Cellulitis of right finger: Secondary | ICD-10-CM | POA: Diagnosis present

## 2015-09-14 HISTORY — PX: I & D EXTREMITY: SHX5045

## 2015-09-14 LAB — CBC WITH DIFFERENTIAL/PLATELET
BASOS ABS: 0 10*3/uL (ref 0.0–0.1)
Basophils Relative: 0 %
EOS ABS: 0.1 10*3/uL (ref 0.0–0.7)
EOS PCT: 1 %
HCT: 36.8 % (ref 36.0–46.0)
HEMOGLOBIN: 12.1 g/dL (ref 12.0–15.0)
Lymphocytes Relative: 32 %
Lymphs Abs: 3 10*3/uL (ref 0.7–4.0)
MCH: 28.2 pg (ref 26.0–34.0)
MCHC: 32.9 g/dL (ref 30.0–36.0)
MCV: 85.8 fL (ref 78.0–100.0)
Monocytes Absolute: 0.6 10*3/uL (ref 0.1–1.0)
Monocytes Relative: 6 %
NEUTROS PCT: 61 %
Neutro Abs: 5.6 10*3/uL (ref 1.7–7.7)
PLATELETS: 201 10*3/uL (ref 150–400)
RBC: 4.29 MIL/uL (ref 3.87–5.11)
RDW: 13.1 % (ref 11.5–15.5)
WBC: 9.2 10*3/uL (ref 4.0–10.5)

## 2015-09-14 LAB — COMPREHENSIVE METABOLIC PANEL
ALBUMIN: 3.4 g/dL — AB (ref 3.5–5.0)
ALK PHOS: 76 U/L (ref 38–126)
ALT: 14 U/L (ref 14–54)
AST: 14 U/L — AB (ref 15–41)
Anion gap: 10 (ref 5–15)
CHLORIDE: 100 mmol/L — AB (ref 101–111)
CO2: 26 mmol/L (ref 22–32)
CREATININE: 0.54 mg/dL (ref 0.44–1.00)
Calcium: 9.5 mg/dL (ref 8.9–10.3)
GFR calc Af Amer: >60 mL/min (ref >60)
GLUCOSE: 229 mg/dL — AB (ref 65–99)
Potassium: 3.4 mmol/L — ABNORMAL LOW (ref 3.5–5.1)
SODIUM: 136 mmol/L (ref 135–145)
TOTAL PROTEIN: 7 g/dL (ref 6.5–8.1)
Total Bilirubin: 0.6 mg/dL (ref 0.3–1.2)

## 2015-09-14 LAB — GLUCOSE, CAPILLARY
GLUCOSE-CAPILLARY: 200 mg/dL — AB (ref 65–99)
GLUCOSE-CAPILLARY: 188 mg/dL — AB (ref 65–99)

## 2015-09-14 LAB — LACTIC ACID, PLASMA
LACTIC ACID, VENOUS: 1.2 mmol/L (ref 0.5–1.9)
Lactic Acid, Venous: 1.6 mmol/L (ref 0.5–1.9)

## 2015-09-14 LAB — CBG MONITORING, ED: GLUCOSE-CAPILLARY: 299 mg/dL — AB (ref 65–99)

## 2015-09-14 LAB — PROTIME-INR
INR: 1.08 (ref 0.00–1.49)
PROTHROMBIN TIME: 14.2 s (ref 11.6–15.2)

## 2015-09-14 LAB — SEDIMENTATION RATE: Sed Rate: 60 mm/hr — ABNORMAL HIGH (ref 0–22)

## 2015-09-14 SURGERY — IRRIGATION AND DEBRIDEMENT EXTREMITY
Anesthesia: General | Site: Hand | Laterality: Right

## 2015-09-14 MED ORDER — FENTANYL CITRATE (PF) 250 MCG/5ML IJ SOLN
INTRAMUSCULAR | Status: DC | PRN
  Administered 2015-09-14: 125 ug via INTRAVENOUS

## 2015-09-14 MED ORDER — INSULIN ASPART 100 UNIT/ML ~~LOC~~ SOLN
SUBCUTANEOUS | Status: DC | PRN
  Administered 2015-09-14: 8 [IU] via INTRAVENOUS
  Administered 2015-09-14: 8 [IU] via SUBCUTANEOUS

## 2015-09-14 MED ORDER — VANCOMYCIN HCL 10 G IV SOLR
1250.0000 mg | Freq: Three times a day (TID) | INTRAVENOUS | Status: DC
  Administered 2015-09-15 – 2015-09-16 (×4): 1250 mg via INTRAVENOUS
  Filled 2015-09-14 (×7): qty 1250

## 2015-09-14 MED ORDER — MAGNESIUM CITRATE PO SOLN: 1.0000 | Freq: Once | ORAL | Status: DC | PRN

## 2015-09-14 MED ORDER — LIDOCAINE HCL (CARDIAC) 20 MG/ML IV SOLN
INTRAVENOUS | Status: DC | PRN
  Administered 2015-09-14: 100 mg via INTRATRACHEAL

## 2015-09-14 MED ORDER — VANCOMYCIN HCL IN DEXTROSE 1-5 GM/200ML-% IV SOLN
1000.0000 mg | Freq: Once | INTRAVENOUS | Status: AC
  Administered 2015-09-14: 1000 mg via INTRAVENOUS
  Filled 2015-09-14: qty 200

## 2015-09-14 MED ORDER — INSULIN ASPART 100 UNIT/ML ~~LOC~~ SOLN
0.0000 [IU] | Freq: Every day | SUBCUTANEOUS | Status: DC
  Administered 2015-09-15: 2 [IU] via SUBCUTANEOUS

## 2015-09-14 MED ORDER — LAMOTRIGINE 200 MG PO TABS
200.0000 mg | ORAL_TABLET | Freq: Every day | ORAL | Status: DC
  Administered 2015-09-14 – 2015-09-16 (×3): 200 mg via ORAL
  Filled 2015-09-14 (×2): qty 1
  Filled 2015-09-14: qty 2
  Filled 2015-09-14 (×2): qty 1
  Filled 2015-09-14 (×2): qty 2

## 2015-09-14 MED ORDER — VANCOMYCIN HCL 10 G IV SOLR
2500.0000 mg | Freq: Once | INTRAVENOUS | Status: AC
  Administered 2015-09-15: 2500 mg via INTRAVENOUS
  Filled 2015-09-14 (×2): qty 2500

## 2015-09-14 MED ORDER — INSULIN ASPART 100 UNIT/ML ~~LOC~~ SOLN
0.0000 [IU] | Freq: Three times a day (TID) | SUBCUTANEOUS | Status: DC
  Administered 2015-09-15: 3 [IU] via SUBCUTANEOUS
  Administered 2015-09-15: 5 [IU] via SUBCUTANEOUS
  Administered 2015-09-15: 3 [IU] via SUBCUTANEOUS
  Administered 2015-09-16: 5 [IU] via SUBCUTANEOUS
  Administered 2015-09-16: 1 [IU] via SUBCUTANEOUS
  Administered 2015-09-16 – 2015-09-17 (×2): 5 [IU] via SUBCUTANEOUS
  Administered 2015-09-17: 2 [IU] via SUBCUTANEOUS

## 2015-09-14 MED ORDER — POLYETHYLENE GLYCOL 3350 17 G PO PACK: 17.0000 g | PACK | Freq: Every day | ORAL | Status: DC | PRN

## 2015-09-14 MED ORDER — INSULIN GLARGINE 100 UNIT/ML ~~LOC~~ SOLN
10.0000 [IU] | Freq: Every day | SUBCUTANEOUS | Status: DC
  Administered 2015-09-14 – 2015-09-15 (×2): 10 [IU] via SUBCUTANEOUS
  Filled 2015-09-14 (×3): qty 0.1

## 2015-09-14 MED ORDER — MIDAZOLAM HCL 2 MG/2ML IJ SOLN
INTRAMUSCULAR | Status: DC | PRN
  Administered 2015-09-14: 2 mg via INTRAVENOUS

## 2015-09-14 MED ORDER — PIPERACILLIN-TAZOBACTAM 3.375 G IVPB 30 MIN
3.3750 g | Freq: Once | INTRAVENOUS | Status: AC
  Administered 2015-09-15: 3.375 g via INTRAVENOUS
  Filled 2015-09-14: qty 50

## 2015-09-14 MED ORDER — ONDANSETRON HCL 4 MG PO TABS: 4.0000 mg | ORAL_TABLET | Freq: Four times a day (QID) | ORAL | Status: DC | PRN

## 2015-09-14 MED ORDER — MIDAZOLAM HCL 2 MG/2ML IJ SOLN
INTRAMUSCULAR | Status: AC
  Filled 2015-09-14: qty 2

## 2015-09-14 MED ORDER — METHOCARBAMOL 500 MG PO TABS
500.0000 mg | ORAL_TABLET | Freq: Four times a day (QID) | ORAL | Status: DC | PRN
  Administered 2015-09-14: 500 mg via ORAL
  Filled 2015-09-14: qty 1

## 2015-09-14 MED ORDER — SODIUM CHLORIDE 0.9 % IR SOLN
Status: DC | PRN
  Administered 2015-09-14: 1000 mL

## 2015-09-14 MED ORDER — SUCCINYLCHOLINE CHLORIDE 20 MG/ML IJ SOLN
INTRAMUSCULAR | Status: DC | PRN
  Administered 2015-09-14: 110 mg via INTRAVENOUS

## 2015-09-14 MED ORDER — IBUPROFEN 200 MG PO TABS
400.0000 mg | ORAL_TABLET | Freq: Once | ORAL | Status: AC | PRN
  Administered 2015-09-14: 400 mg via ORAL
  Filled 2015-09-14: qty 2

## 2015-09-14 MED ORDER — PROPOFOL 10 MG/ML IV BOLUS
INTRAVENOUS | Status: DC | PRN
  Administered 2015-09-14: 200 mg via INTRAVENOUS

## 2015-09-14 MED ORDER — KETOROLAC TROMETHAMINE 30 MG/ML IJ SOLN
30.0000 mg | Freq: Once | INTRAMUSCULAR | Status: AC
  Administered 2015-09-14: 30 mg via INTRAVENOUS
  Filled 2015-09-14: qty 1

## 2015-09-14 MED ORDER — ONDANSETRON HCL 4 MG/2ML IJ SOLN: 4.0000 mg | Freq: Once | INTRAMUSCULAR | Status: DC | PRN

## 2015-09-14 MED ORDER — DOCUSATE SODIUM 100 MG PO CAPS
100.0000 mg | ORAL_CAPSULE | Freq: Two times a day (BID) | ORAL | Status: DC
  Administered 2015-09-14 – 2015-09-16 (×5): 100 mg via ORAL
  Filled 2015-09-14 (×6): qty 1

## 2015-09-14 MED ORDER — FENTANYL CITRATE (PF) 250 MCG/5ML IJ SOLN
INTRAMUSCULAR | Status: AC
  Filled 2015-09-14: qty 5

## 2015-09-14 MED ORDER — LACTATED RINGERS IV SOLN
INTRAVENOUS | Status: DC | PRN
Start: 2015-09-14 — End: 2015-09-14
  Administered 2015-09-14: 20:00:00 via INTRAVENOUS

## 2015-09-14 MED ORDER — FAMOTIDINE 20 MG PO TABS: 20.0000 mg | ORAL_TABLET | Freq: Two times a day (BID) | ORAL | Status: DC | PRN

## 2015-09-14 MED ORDER — PROPOFOL 10 MG/ML IV BOLUS
INTRAVENOUS | Status: AC
  Filled 2015-09-14: qty 20

## 2015-09-14 MED ORDER — VITAMIN C 500 MG PO TABS
1000.0000 mg | ORAL_TABLET | Freq: Every day | ORAL | Status: DC
  Administered 2015-09-14 – 2015-09-15 (×2): 1000 mg via ORAL
  Filled 2015-09-14 (×2): qty 2

## 2015-09-14 MED ORDER — PIPERACILLIN-TAZOBACTAM 3.375 G IVPB
3.3750 g | Freq: Three times a day (TID) | INTRAVENOUS | Status: DC
  Administered 2015-09-15 – 2015-09-16 (×4): 3.375 g via INTRAVENOUS
  Filled 2015-09-14 (×6): qty 50

## 2015-09-14 MED ORDER — VENLAFAXINE HCL ER 75 MG PO CP24
150.0000 mg | ORAL_CAPSULE | Freq: Every day | ORAL | Status: DC
  Administered 2015-09-15: 150 mg via ORAL
  Filled 2015-09-14 (×2): qty 1
  Filled 2015-09-14: qty 2

## 2015-09-14 MED ORDER — SODIUM CHLORIDE 0.9 % IV BOLUS (SEPSIS)
1000.0000 mL | Freq: Once | INTRAVENOUS | Status: AC
  Administered 2015-09-14: 1000 mL via INTRAVENOUS

## 2015-09-14 MED ORDER — BISACODYL 10 MG RE SUPP: 10.0000 mg | Freq: Every day | RECTAL | Status: DC | PRN

## 2015-09-14 MED ORDER — PROMETHAZINE HCL 12.5 MG RE SUPP
12.5000 mg | Freq: Four times a day (QID) | RECTAL | Status: DC | PRN
  Filled 2015-09-14: qty 1

## 2015-09-14 MED ORDER — BUPIVACAINE HCL (PF) 0.25 % IJ SOLN
INTRAMUSCULAR | Status: AC
  Filled 2015-09-14: qty 30

## 2015-09-14 MED ORDER — IBUPROFEN 400 MG PO TABS
ORAL_TABLET | ORAL | Status: AC
  Administered 2015-09-14: 400 mg via ORAL
  Filled 2015-09-14: qty 1

## 2015-09-14 MED ORDER — ONDANSETRON HCL 4 MG/2ML IJ SOLN
4.0000 mg | Freq: Four times a day (QID) | INTRAMUSCULAR | Status: DC | PRN
  Administered 2015-09-14 – 2015-09-17 (×3): 4 mg via INTRAVENOUS
  Filled 2015-09-14 (×3): qty 2

## 2015-09-14 MED ORDER — DIPHENHYDRAMINE HCL 25 MG PO CAPS: 25.0000 mg | ORAL_CAPSULE | Freq: Four times a day (QID) | ORAL | Status: DC | PRN

## 2015-09-14 MED ORDER — HYDROMORPHONE HCL 1 MG/ML IJ SOLN: 0.5000 mg | INTRAMUSCULAR | Status: DC | PRN

## 2015-09-14 MED ORDER — ENOXAPARIN SODIUM 40 MG/0.4ML ~~LOC~~ SOLN
40.0000 mg | SUBCUTANEOUS | Status: DC
  Administered 2015-09-15: 40 mg via SUBCUTANEOUS
  Filled 2015-09-14: qty 0.4

## 2015-09-14 MED ORDER — SODIUM CHLORIDE 0.45 % IV SOLN
INTRAVENOUS | Status: AC
  Administered 2015-09-15: via INTRAVENOUS

## 2015-09-14 MED ORDER — BUPIVACAINE HCL (PF) 0.25 % IJ SOLN
INTRAMUSCULAR | Status: DC | PRN
  Administered 2015-09-14: 30 mL

## 2015-09-14 MED ORDER — METHOCARBAMOL 1000 MG/10ML IJ SOLN
500.0000 mg | Freq: Four times a day (QID) | INTRAVENOUS | Status: DC | PRN
  Filled 2015-09-14: qty 5

## 2015-09-14 SURGICAL SUPPLY — 41 items
BANDAGE ELASTIC 4 VELCRO ST LF (GAUZE/BANDAGES/DRESSINGS) ×2 IMPLANT
BNDG CMPR 9X4 STRL LF SNTH (GAUZE/BANDAGES/DRESSINGS) ×1
BNDG COHESIVE 4X5 TAN STRL (GAUZE/BANDAGES/DRESSINGS) ×1 IMPLANT
BNDG CONFORM 2 STRL LF (GAUZE/BANDAGES/DRESSINGS) ×1 IMPLANT
BNDG ESMARK 4X9 LF (GAUZE/BANDAGES/DRESSINGS) ×1 IMPLANT
BNDG GAUZE ELAST 4 BULKY (GAUZE/BANDAGES/DRESSINGS) ×7 IMPLANT
CORDS BIPOLAR (ELECTRODE) ×2 IMPLANT
CUFF TOURNIQUET SINGLE 18IN (TOURNIQUET CUFF) ×2 IMPLANT
CUFF TOURNIQUET SINGLE 24IN (TOURNIQUET CUFF) IMPLANT
DECANTER SPIKE VIAL GLASS SM (MISCELLANEOUS) ×1 IMPLANT
DRSG ADAPTIC 3X8 NADH LF (GAUZE/BANDAGES/DRESSINGS) ×2 IMPLANT
GAUZE SPONGE 4X4 12PLY STRL (GAUZE/BANDAGES/DRESSINGS) ×3 IMPLANT
GAUZE XEROFORM 1X8 LF (GAUZE/BANDAGES/DRESSINGS) ×2 IMPLANT
GLOVE BIOGEL M 8.0 STRL (GLOVE) ×2 IMPLANT
GLOVE SS BIOGEL STRL SZ 8 (GLOVE) ×1 IMPLANT
GLOVE SUPERSENSE BIOGEL SZ 8 (GLOVE) ×1
GOWN STRL REUS W/ TWL LRG LVL3 (GOWN DISPOSABLE) ×1 IMPLANT
GOWN STRL REUS W/ TWL XL LVL3 (GOWN DISPOSABLE) ×2 IMPLANT
GOWN STRL REUS W/TWL LRG LVL3 (GOWN DISPOSABLE) ×2
GOWN STRL REUS W/TWL XL LVL3 (GOWN DISPOSABLE) ×4
HANDPIECE INTERPULSE COAX TIP (DISPOSABLE)
KIT BASIN OR (CUSTOM PROCEDURE TRAY) ×2 IMPLANT
KIT ROOM TURNOVER OR (KITS) ×2 IMPLANT
MANIFOLD NEPTUNE II (INSTRUMENTS) ×2 IMPLANT
NDL HYPO 25GX1X1/2 BEV (NEEDLE) IMPLANT
NEEDLE HYPO 25GX1X1/2 BEV (NEEDLE) ×2 IMPLANT
NS IRRIG 1000ML POUR BTL (IV SOLUTION) ×2 IMPLANT
PACK ORTHO EXTREMITY (CUSTOM PROCEDURE TRAY) ×2 IMPLANT
PAD ARMBOARD 7.5X6 YLW CONV (MISCELLANEOUS) ×2 IMPLANT
PAD CAST 4YDX4 CTTN HI CHSV (CAST SUPPLIES) ×1 IMPLANT
PADDING CAST COTTON 4X4 STRL (CAST SUPPLIES) ×4
SET CYSTO W/LG BORE CLAMP LF (SET/KITS/TRAYS/PACK) ×1 IMPLANT
SET HNDPC FAN SPRY TIP SCT (DISPOSABLE) IMPLANT
SPONGE LAP 4X18 X RAY DECT (DISPOSABLE) ×2 IMPLANT
SYR CONTROL 10ML LL (SYRINGE) IMPLANT
TOWEL OR 17X24 6PK STRL BLUE (TOWEL DISPOSABLE) ×2 IMPLANT
TOWEL OR 17X26 10 PK STRL BLUE (TOWEL DISPOSABLE) ×2 IMPLANT
TUBE ANAEROBIC SPECIMEN COL (MISCELLANEOUS) IMPLANT
TUBE CONNECTING 12X1/4 (SUCTIONS) ×2 IMPLANT
WATER STERILE IRR 1000ML POUR (IV SOLUTION) ×2 IMPLANT
YANKAUER SUCT BULB TIP NO VENT (SUCTIONS) ×2 IMPLANT

## 2015-09-14 NOTE — ED Provider Notes (Signed)
CSN: [id number]     Arrival date & time 09/14/15  [id number] History   First MD Initiated Contact with Patient 09/14/15 1026     Chief Complaint  Patient presents with  . Hand Pain   (Consider location/radiation/quality/duration/timing/severity/associated sxs/prior Treatment) Patient is a 40 y.o. female presenting with hand pain. The history is provided by the patient.  Hand Pain This is a new problem. The current episode started more than 2 days ago. The problem has been gradually worsening.    Past Medical History  Diagnosis Date  . Allergy   . Bipolar 1 disorder [facility](HCC)     Sees Dr. Ledon SnareMcKnight, psychology,  [phone]  . Fatigue   . Anemia   . Diabetes mellitus without complication [facility](HCC)    Past Surgical History  Procedure Laterality Date  . Abdominal hysterectomy     No family history on file. Social History  Substance Use Topics  . Smoking status: Never Smoker   . Smokeless tobacco: Never Used  . Alcohol Use: 0.0 oz/week    0 Standard drinks or equivalent per week     Comment: occ   OB History    No data available     Review of Systems  Constitutional: Negative.   Musculoskeletal: Positive for joint swelling.  Skin: Negative.   All other systems reviewed and are negative.   Allergies  Hydromorphone hcl; Meperidine hcl; Morphine; Lisinopril; Sumatriptan; and Zolmitriptan  Home Medications   Prior to Admission medications   Medication Sig Start Date End Date Taking? Authorizing Provider  amoxicillin (AMOXIL) 875 MG tablet Take 875 mg by mouth 2 (two) times daily. LEFT OVER MEDICINE OF A FAMILY MEMBERS   Yes Historical Provider, MD  Insulin Glargine (LANTUS SOLOSTAR) 100 UNIT/ML Solostar Pen Inject 20 Units into the skin daily at 10 pm. Patient taking differently: Inject 20-25 Units into the skin every morning.  07/03/14  Yes Joanna Puffrystal S Dorsey, MD  lamoTRIgine (LAMICTAL) 100 MG tablet Take 2 tablets (200 mg total) by mouth daily. 05/12/15  Yes Joanna Puffrystal S Dorsey, MD    metFORMIN (GLUCOPHAGE-XR) 500 MG 24 hr tablet Take 3 tablets (1,500 mg total) by mouth at bedtime. 03/24/14  Yes Joanna Puffrystal S Dorsey, MD  venlafaxine XR (EFFEXOR-XR) 150 MG 24 hr capsule Take 1 capsule (150 mg total) by mouth daily with breakfast. 06/28/15  Yes Joanna Puffrystal S Dorsey, MD  buPROPion (WELLBUTRIN XL) 150 MG 24 hr tablet Take 1 tablet (150 mg total) by mouth daily. Patient not taking: Reported on 09/14/2015 11/24/14   Joanna Puffrystal S Dorsey, MD  Calcium Carbonate-Vitamin D (CALCIUM 600/VITAMIN D) 600-400 MG-UNIT per chew tablet Chew 2 tablets by mouth daily.     Historical Provider, MD  diphenhydrAMINE (BENADRYL) 12.5 MG/5ML liquid Take 12.5 mg by mouth daily as needed.    Historical Provider, MD  ferrous sulfate (ECK FERROUS SULFATE) 325 (65 FE) MG tablet Take 325 mg by mouth 2 (two) times daily with a meal.     Historical Provider, MD  ibuprofen (ADVIL,MOTRIN) 800 MG tablet Take 1 tablet (800 mg total) by mouth every 8 (eight) hours as needed. 10/22/14   Erasmo DownerAngela M Bacigalupo, MD  meloxicam (MOBIC) 7.5 MG tablet Take 1 tablet (7.5 mg total) by mouth 2 (two) times daily after a meal. 04/21/15   Linna HoffJames D Kindl, MD  promethazine (PHENERGAN) 25 MG tablet Take 1 tablet (25 mg total) by mouth every 6 (six) hours as needed for nausea or vomiting. Patient not taking: Reported on 10/09/2014 10/03/13  Oswaldo Conroy, PA-C   Meds Ordered and Administered this Visit  Medications - No data to display  BP 109/75 mmHg  Pulse 98  Temp(Src) 98.8 F (37.1 C) (Oral)  Resp 24  SpO2 97% No data found.   Physical Exam  Constitutional: She is oriented to person, place, and time. She appears well-developed and well-nourished.  Musculoskeletal: She exhibits tenderness.       Right hand: She exhibits decreased range of motion, tenderness and swelling.       Hands: Neurological: She is alert and oriented to person, place, and time.  Skin: Skin is warm and dry. There is erythema.  Nursing note and vitals  reviewed.   ED Course  Procedures (including critical care time)  Labs Review Labs Reviewed - No data to display  Imaging Review No results found.   Visual Acuity Review  Right Eye Distance:   Left Eye Distance:   Bilateral Distance:    Right Eye Near:   Left Eye Near:    Bilateral Near:         MDM   1. Infection of finger    Sent for hand surg eval of diabetic with worsening rmf infection     Linna Hoff, MD 09/14/15 1045

## 2015-09-14 NOTE — ED Notes (Signed)
Report attempted 

## 2015-09-14 NOTE — ED Notes (Signed)
Notified floor that patient will be going to OR first

## 2015-09-14 NOTE — Transfer of Care (Signed)
Immediate Anesthesia Transfer of Care Note  Patient: [name] PatrickRhonda N [name]  Procedure(s) Performed: Procedure(s): IRRIGATION AND DEBRIDEMENT RIGHT MIDDLE FINGER (Right)  Patient Location: PACU  Anesthesia Type:General  Level of Consciousness: awake, alert  and oriented  Airway & Oxygen Therapy: Patient connected to nasal cannula oxygen  Post-op Assessment: Report given to RN and Post -op Vital signs reviewed and stable  Post vital signs: Reviewed and stable  Last Vitals:  Filed Vitals:   09/14/15 1900 09/14/15 2114  BP: 124/79   Pulse: 86   Temp:  36.9 C  Resp:      Last Pain:  Filed Vitals:   09/14/15 2116  PainSc: 8          Complications: No apparent anesthesia complications

## 2015-09-14 NOTE — Anesthesia Procedure Notes (Signed)
Procedure Name: Intubation Date/Time: 09/14/2015 8:38 PM Performed by: Molli HazardGORDON, Maecy Podgurski M Pre-anesthesia Checklist: Patient identified, Emergency Drugs available, Suction available and Patient being monitored Patient Re-evaluated:Patient Re-evaluated prior to inductionOxygen Delivery Method: Circle system utilized Preoxygenation: Pre-oxygenation with 100% oxygen Intubation Type: IV induction, Rapid sequence and Cricoid Pressure applied Laryngoscope Size: Miller and 2 Grade View: Grade I Tube type: Oral Tube size: 7.5 mm Number of attempts: 1 Airway Equipment and Method: Stylet Placement Confirmation: ETT inserted through vocal cords under direct vision,  positive ETCO2 and breath sounds checked- equal and bilateral Secured at: 22 cm Tube secured with: Tape Dental Injury: Teeth and Oropharynx as per pre-operative assessment

## 2015-09-14 NOTE — Progress Notes (Signed)
Pharmacy Antibiotic Note  [name] [name] is a 40 y.o. female admitted on 09/14/2015 with cellulitis.  Pharmacy has been consulted for Zosyn and vancomycin dosing.  Day #1 of abx for cellulitis. Went to OR for I&D of R middle finger abscess. SCr stable, normalized CrCl ~85-1500ml/min. Afebrile, WBC wnl.  Plan: Start Zosyn 3.375 gm IV q8h (4 hour infusion) Give vancomycin 2.5g IV x 1, then start vancomycin 1,250mg  IV Q8 (slightly lower than obesity nomogram) Monitor clinical picture, renal function, VT at Css F/U C&S, abx deescalation / LOT   Height: 5\' 8"  (172.7 cm) Weight: (!) 328 lb (148.78 kg) IBW/kg (Calculated) : 63.9  Temp (24hrs), Avg:98.8 F (37.1 C), Min:98.5 F (36.9 C), Max:99.2 F (37.3 C)   Recent Labs Lab 09/14/15 1638 09/14/15 1916  WBC 9.2  --   CREATININE 0.54  --   LATICACIDVEN 1.2 1.6    Estimated Creatinine Clearance: 143 mL/min (by C-G formula based on Cr of 0.54).    Allergies  Allergen Reactions  . Hydromorphone Hcl Anaphylaxis and Hives  . Meperidine Hcl Anaphylaxis and Hives    REACTION: ?anaphylaxis  . Morphine Anaphylaxis and Hives    Anything in this family REACTION: ?anaphyaxis  . Lisinopril Cough    REACTION: cough  . Sumatriptan Other (See Comments)    REACTION: intolerance  . Zolmitriptan Other (See Comments)    Headache    Antimicrobials this admission: Zosyn 7/11 >>  Vancomycin 7/11 >>   Dose adjustments this admission: n/a  Microbiology results: 7/11 BCx: sent 7/11 Wound Cx: sent  Thank you for allowing pharmacy to be a part of this patient's care.  Armandina StammerBATCHELDER,Phoebe Marter J 09/14/2015 9:37 PM

## 2015-09-14 NOTE — H&P (Signed)
[name] [name] is an 40 y.o. female.   Chief Complaint: Patient presents with a abscess to the right middle finger HPI: Patient presents for irrigation debridement right middle finger secondary to abscess. I reviewed her chart.  She's been admitted by the hospitalist. I was called by the hospitalist as they were concerned about the vascularity to her finger.  She has obvious infection and its sequelae  She has multiple medical problems.  Patient notes no neck back chest or abdominal pain  Past Medical History  Diagnosis Date  . Allergy   . Bipolar 1 disorder [facility])     Sees Dr. Pablo Ledger, psychology,  [phone]  . Fatigue   . Anemia   . Diabetes mellitus without complication [facility])     Past Surgical History  Procedure Laterality Date  . Abdominal hysterectomy      No family history on file. Social History:  reports that she has never smoked. She has never used smokeless tobacco. She reports that she drinks alcohol. She reports that she does not use illicit drugs.  Allergies:  Allergies  Allergen Reactions  . Hydromorphone Hcl Anaphylaxis and Hives  . Meperidine Hcl Anaphylaxis and Hives    REACTION: ?anaphylaxis  . Morphine Anaphylaxis and Hives    Anything in this family REACTION: ?anaphyaxis  . Lisinopril Cough    REACTION: cough  . Sumatriptan Other (See Comments)    REACTION: intolerance  . Zolmitriptan Other (See Comments)    Headache     (Not in a hospital admission)  Results for orders placed or performed during the hospital encounter of 09/14/15 (from the past 48 hour(s))  CBG monitoring, ED     Status: Abnormal   Collection Time: 09/14/15 11:34 AM  Result Value Ref Range   Glucose-Capillary 299 (H) 65 - 99 mg/dL  CBC with Differential     Status: None   Collection Time: 09/14/15  4:38 PM  Result Value Ref Range   WBC 9.2 4.0 - 10.5 K/uL   RBC 4.29 3.87 - 5.11 MIL/uL   Hemoglobin 12.1 12.0 - 15.0 g/dL   HCT 36.8 36.0 - 46.0 %   MCV 85.8 78.0 -  100.0 fL   MCH 28.2 26.0 - 34.0 pg   MCHC 32.9 30.0 - 36.0 g/dL   RDW 13.1 11.5 - 15.5 %   Platelets 201 150 - 400 K/uL   Neutrophils Relative % 61 %   Neutro Abs 5.6 1.7 - 7.7 K/uL   Lymphocytes Relative 32 %   Lymphs Abs 3.0 0.7 - 4.0 K/uL   Monocytes Relative 6 %   Monocytes Absolute 0.6 0.1 - 1.0 K/uL   Eosinophils Relative 1 %   Eosinophils Absolute 0.1 0.0 - 0.7 K/uL   Basophils Relative 0 %   Basophils Absolute 0.0 0.0 - 0.1 K/uL  Comprehensive metabolic panel     Status: Abnormal   Collection Time: 09/14/15  4:38 PM  Result Value Ref Range   Sodium 136 135 - 145 mmol/L   Potassium 3.4 (L) 3.5 - 5.1 mmol/L   Chloride 100 (L) 101 - 111 mmol/L   CO2 26 22 - 32 mmol/L   Glucose, Bld 229 (H) 65 - 99 mg/dL   BUN <5 (L) 6 - 20 mg/dL   Creatinine, Ser 0.54 0.44 - 1.00 mg/dL   Calcium 9.5 8.9 - 10.3 mg/dL   Total Protein 7.0 6.5 - 8.1 g/dL   Albumin 3.4 (L) 3.5 - 5.0 g/dL   AST 14 (L) 15 -  41 U/L   ALT 14 14 - 54 U/L   Alkaline Phosphatase 76 38 - 126 U/L   Total Bilirubin 0.6 0.3 - 1.2 mg/dL   GFR calc non Af Amer >60 >60 mL/min   GFR calc Af Amer >60 >60 mL/min    Comment: (NOTE) The eGFR has been calculated using the CKD EPI equation. This calculation has not been validated in all clinical situations. eGFR's persistently <60 mL/min signify possible Chronic Kidney Disease.    Anion gap 10 5 - 15  Lactic acid, plasma     Status: None   Collection Time: 09/14/15  4:38 PM  Result Value Ref Range   Lactic Acid, Venous 1.2 0.5 - 1.9 mmol/L  Lactic acid, plasma     Status: None   Collection Time: 09/14/15  7:16 PM  Result Value Ref Range   Lactic Acid, Venous 1.6 0.5 - 1.9 mmol/L   Dg Finger Middle Right  09/14/2015  CLINICAL DATA:  40 year old female with swelling right middle finger. Discoloration of nail. Pus around nail. No known injury. Initial encounter. EXAM: RIGHT MIDDLE FINGER 2+V COMPARISON:  04/17/2010 plain film exam. FINDINGS: Soft tissue swelling suggestive  of cellulitis without plain film evidence of osteomyelitis. No fracture or dislocation. IMPRESSION: Soft tissue swelling suggestive of cellulitis without plain film evidence of osteomyelitis. Electronically Signed   By: Genia Del M.D.   On: 09/14/2015 17:08    Review of Systems  Eyes: Negative.   Respiratory: Negative.   Musculoskeletal: Negative.   Neurological: Negative.   Psychiatric/Behavioral: Negative.     Blood pressure 124/79, pulse 86, temperature 99.2 F (37.3 C), temperature source Oral, resp. rate 20, height '5\' 8"'$  (1.727 m), weight 148.78 kg (328 lb), SpO2 100 %. Physical Exam  Right middle finger abscess with dysvascular findings distally. No evidence of infection or dystrophy about the adjacent digits. Her flexor tendon apparatus is not overly problematic at this juncture and I feel the abscess is likely confined dorsally  She is obese. Breath sounds are equal bilaterally. Abdomen is nontender nondistended. Lower extremity examination is stable without signs of DVT   Assessment/Plan We'll plan for irrigation debridement emergently right middle finger. She has not obvious abscess. She understands risks and benefits and desires to proceed  We are planning surgery for your upper extremity. The risk and benefits of surgery to include risk of bleeding, infection, anesthesia,  damage to normal structures and failure of the surgery to accomplish its intended goals of relieving symptoms and restoring function have been discussed in detail. With this in mind we plan to proceed. I have specifically discussed with the patient the pre-and postoperative regime and the dos and don'ts and risk and benefits in great detail. Risk and benefits of surgery also include risk of dystrophy(CRPS), chronic nerve pain, failure of the healing process to go onto completion and other inherent risks of surgery The relavent the pathophysiology of the disease/injury process, as well as the alternatives for  treatment and postoperative course of action has been discussed in great detail with the patient who desires to proceed.  We will do everything in our power to help you (the patient) restore function to the upper extremity. It is a pleasure to see this patient today.   Paulene Floor, MD 09/14/2015, 8:25 PM

## 2015-09-14 NOTE — Anesthesia Preprocedure Evaluation (Signed)
Anesthesia Evaluation  Patient identified by MRN, date of birth, ID band Patient awake    Reviewed: Allergy & Precautions, NPO status , Patient's Chart, lab work & pertinent test results  Airway Mallampati: II  TM Distance: >3 FB     Dental   Pulmonary     + decreased breath sounds      Cardiovascular hypertension, Normal cardiovascular exam     Neuro/Psych  Headaches, Anxiety Bipolar Disorder  Neuromuscular disease    GI/Hepatic PUD,   Endo/Other  diabetes, Type 1, Insulin Dependent, Oral Hypoglycemic AgentsMorbid obesity  Renal/GU      Musculoskeletal   Abdominal   Peds  Hematology  (+) anemia ,   Anesthesia Other Findings   Reproductive/Obstetrics                             Anesthesia Physical Anesthesia Plan  ASA: III and emergent  Anesthesia Plan: General   Post-op Pain Management:    Induction: Intravenous  Airway Management Planned: LMA  Additional Equipment:   Intra-op Plan:   Post-operative Plan: Extubation in OR  Informed Consent: I have reviewed the patients History and Physical, chart, labs and discussed the procedure including the risks, benefits and alternatives for the proposed anesthesia with the patient or authorized representative who has indicated his/her understanding and acceptance.     Plan Discussed with: CRNA, Anesthesiologist and Surgeon  Anesthesia Plan Comments:         Anesthesia Quick Evaluation

## 2015-09-14 NOTE — ED Notes (Addendum)
Pt has swelling and redness to right middle finger. Pt states she has been taking a family members antibiotic x 3 does with worsening in swelling. Pt reports hx of diabetes and cbg this am was 350. Pt also states she feels nauseous.

## 2015-09-14 NOTE — H&P (Signed)
[facility] Admission History and Physical Service Pager: [phone]  Patient name: [name] [name] Medical record number: [id number] Date of birth: December 27, 1975 Age: 40 y.o. Gender: female  Primary Care Provider: Kathrine Cords, MD Consultants: Orthopedics Code Status: Full  Chief Complaint: Right third finger cellulitis  Assessment and Plan: [name] [name] is a 40 y.o. female presenting with right third finger cellulitis. PMH is significant for T2DM and Bipolar 1 disorder  Right Third Finger Cellulitis. Plain film without signs of osteomyelitis, though concern for poor distal perfusion. No clear source for infection. No signs of systemic illness - Hand surgery consulted, appreciate assistance - OK for emergent surgery tonight if deemed necessary by hand surgery - no active chest pain, good functional status - Vanc/Zosyn given location of infection - Blood cultures (already received dose of vanc in ED) - Tailor antibiotics pending blood cultures +/- intraop cultures if available - Check ESR, CRP  T2DM. On lantus 25U nightly at home. Last A1c 7.6 on 07/02/2014.  - Half home lantus to 10U daily - SSI  - Check A1c.   Bipolar 1 Disorder - Continue home lamictal and venlafaxine  FEN/GI: NPO until surgery eval, 1/2NS _0  for 12 hours Prophylaxis: Lovenox  Disposition: Admitted pending above management.   History of Present Illness:  [name] [name] is a 40 y.o. female presenting with right middle finger pain and swelling.  Patient first noticed pain and swelling of her right middle finger 3 days ago. Thinks that she may have had a crack in her skin, but no other injuries or lesions. Patient has a history of carpal tunnel syndrome and has decreased sensation in her first three digits. Patient was concerned that she may have an infection so she took a family member's "amoxicillin" which did not improve the pain or swelling. Pain became so severe that  she presented to the ED today for further evaluation. She reports intermittent fevers and chills. Mild nausea without vomiting. No chest pain or shortness of breath. No abdominal pain. No other rashes or sores.   Review Of Systems: Per HPI, Otherwise the remainder of the systems were negative.  Patient Active Problem List   Diagnosis Date Noted  . Cellulitis 09/14/2015  . Abdominal pain, left lower quadrant 07/13/2014  . Groin pain, chronic, right 07/13/2014  . Lung nodule seen on imaging study 09/24/2013  . Chest pain 09/24/2013  . Diabetes mellitus ([place]) 07/11/2012  . Pollen allergies 07/11/2012  . 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 Meralgia paresthetica of left side 08/18/2010  . Fatigue 04/20/2010  . INTERSTITIAL CYSTITIS 07/22/2007  . MENOPAUSE, SURGICAL 04/24/2007  . HIATAL HERNIA 11/20/2006  . Hypercholesterolemia 05/03/2006  . OBESITY, NOS 05/03/2006  . ANEMIA, IRON DEFICIENCY, UNSPEC. 05/03/2006  . BIPOLAR DISORDER 05/03/2006  . ANXIETY 05/03/2006  . MIGRAINE, UNSPEC., W/O INTRACTABLE MIGRAINE 05/03/2006  . HYPERTENSION, BENIGN SYSTEMIC 05/03/2006  . PEPTIC ULCER DIS., UNSPEC. W/O OBSTRUCTION 05/03/2006    Past Medical History: Past Medical History  Diagnosis Date  . Allergy   . Bipolar 1 disorder [facility])     Sees Dr. Pablo Ledger, psychology,  [phone]  . Fatigue   . Anemia   . Diabetes mellitus without complication [facility])     Past Surgical History: Past Surgical History  Procedure Laterality Date  . Abdominal hysterectomy      Social History: Social History  Substance Use Topics  . Smoking status: Never Smoker   . Smokeless tobacco: Never Used  . Alcohol Use: 0.0 oz/week  0 Standard drinks or equivalent per week     Comment: occ   Additional social history: None  Please also refer to relevant sections of EMR.  Family History: Noncontributory.   Allergies and Medications: Allergies  Allergen Reactions  . Hydromorphone Hcl Anaphylaxis and Hives  .  Meperidine Hcl Anaphylaxis and Hives    REACTION: ?anaphylaxis  . Morphine Anaphylaxis and Hives    Anything in this family REACTION: ?anaphyaxis  . Lisinopril Cough    REACTION: cough  . Sumatriptan Other (See Comments)    REACTION: intolerance  . Zolmitriptan Other (See Comments)    Headache   No current facility-administered medications on file prior to encounter.   Current Outpatient Prescriptions on File Prior to Encounter  Medication Sig Dispense Refill  . Insulin Glargine (LANTUS SOLOSTAR) 100 UNIT/ML Solostar Pen Inject 20 Units into the skin daily at 10 pm. (Patient taking differently: Inject 20-25 Units into the skin daily at 10 pm. ) 15 mL 11  . lamoTRIgine (LAMICTAL) 100 MG tablet Take 2 tablets (200 mg total) by mouth daily. (Patient taking differently: Take 200 mg by mouth at bedtime. ) 60 tablet 1  . metFORMIN (GLUCOPHAGE-XR) 500 MG 24 hr tablet Take 3 tablets (1,500 mg total) by mouth at bedtime. (Patient taking differently: Take 1,000 mg by mouth at bedtime. ) 90 tablet 4  . venlafaxine XR (EFFEXOR-XR) 150 MG 24 hr capsule Take 1 capsule (150 mg total) by mouth daily with breakfast. 30 capsule 3  . buPROPion (WELLBUTRIN XL) 150 MG 24 hr tablet Take 1 tablet (150 mg total) by mouth daily. (Patient not taking: Reported on 09/14/2015) 30 tablet 1  . ibuprofen (ADVIL,MOTRIN) 800 MG tablet Take 1 tablet (800 mg total) by mouth every 8 (eight) hours as needed. 30 tablet 0  . meloxicam (MOBIC) 7.5 MG tablet Take 1 tablet (7.5 mg total) by mouth 2 (two) times daily after a meal. 30 tablet 1  . promethazine (PHENERGAN) 25 MG tablet Take 1 tablet (25 mg total) by mouth every 6 (six) hours as needed for nausea or vomiting. (Patient not taking: Reported on 10/09/2014) 30 tablet 0   Objective: BP 124/79 mmHg  Pulse 86  Temp(Src) 99.2 F (37.3 C) (Oral)  Resp 20  Ht _0  (1.727 m)  Wt 328 lb (148.78 kg)  BMI 49.88 kg/m2  SpO2 100% Exam: General: Pleasant 40yo female in NAD lying  comfortably on hospital bed Eyes: PERRL, EOMI ENTM: MMM, O/P clear Neck: FROM Cardiovascular: RRR, no murmurs noted Respiratory: NWOB, CTAB Abdomen: S, NT, ND MSK: Right third finger significantly edematous and erythematous. Some flexion at PIP, no flexion at DIP. Distal 1/3rd of digit blanched with no apparent capillary perfusion. Nail bed with purple discoloration. Exquisitely tender to palpation, no significant fluctuance or purulence noted.  Skin: Skin findings noted as above, warm, dry Neuro: Alert, oriented, No focal deficits.  Psych: Appropriate mood and affect.   Labs and Imaging: CBC BMET   Recent Labs Lab 09/14/15 1638  WBC 9.2  HGB 12.1  HCT 36.8  PLT 201    Recent Labs Lab 09/14/15 1638  NA 136  K 3.4*  CL 100*  CO2 26  BUN <5*  CREATININE 0.54  GLUCOSE 229*  CALCIUM 9.5     Dg Finger Middle Right  09/14/2015  CLINICAL DATA:  40 year old female with swelling right middle finger. Discoloration of nail. Pus around nail. No known injury. Initial encounter. EXAM: RIGHT MIDDLE FINGER 2+V COMPARISON:  04/17/2010 plain  film exam. FINDINGS: Soft tissue swelling suggestive of cellulitis without plain film evidence of osteomyelitis. No fracture or dislocation. IMPRESSION: Soft tissue swelling suggestive of cellulitis without plain film evidence of osteomyelitis. Electronically Signed   By: Genia Del M.D.   On: 09/14/2015 17:08   Vivi Barrack, MD 09/14/2015, 7:33 PM PGY-3, [place] Intern pager: [phone], text pages welcome

## 2015-09-14 NOTE — Op Note (Signed)
See dictation #[id number] Amanda PeaGramig MD

## 2015-09-14 NOTE — Anesthesia Postprocedure Evaluation (Signed)
Anesthesia Post Note  Patient: [name] PatrickRhonda N Litaker  Procedure(s) Performed: Procedure(s) (LRB): IRRIGATION AND DEBRIDEMENT RIGHT MIDDLE FINGER (Right)  Patient location during evaluation: PACU Anesthesia Type: General Level of consciousness: awake, patient cooperative and oriented Pain management: pain level controlled Vital Signs Assessment: post-procedure vital signs reviewed and stable Respiratory status: spontaneous breathing and respiratory function stable Cardiovascular status: blood pressure returned to baseline and stable Anesthetic complications: no    Last Vitals:  Filed Vitals:   09/14/15 2144 09/14/15 2145  BP: 116/84   Pulse: 90 90  Temp: 36.6 C   Resp: 25 24    Last Pain:  Filed Vitals:   09/14/15 2149  PainSc: 8                  Jedaiah Rathbun EDWARD

## 2015-09-14 NOTE — ED Notes (Signed)
Right middle finger pain, swelling, discoloration of nail.  Pus around nail.  Onset Saturday of symptoms.  Patient took antibiotics of a family member (amoxicillin 875 mg) patient has taken 3 doses and reports finger is getting worse.  No known injury.

## 2015-09-15 ENCOUNTER — Encounter (HOSPITAL_COMMUNITY): Payer: Self-pay | Admitting: Orthopedic Surgery

## 2015-09-15 DIAGNOSIS — L02511 Cutaneous abscess of right hand: Secondary | ICD-10-CM

## 2015-09-15 DIAGNOSIS — R52 Pain, unspecified: Secondary | ICD-10-CM | POA: Insufficient documentation

## 2015-09-15 DIAGNOSIS — E11628 Type 2 diabetes mellitus with other skin complications: Secondary | ICD-10-CM

## 2015-09-15 LAB — GLUCOSE, CAPILLARY
GLUCOSE-CAPILLARY: 235 mg/dL — AB (ref 65–99)
Glucose-Capillary: 315 mg/dL — ABNORMAL HIGH (ref 65–99)
Glucose-Capillary: 234 mg/dL — ABNORMAL HIGH (ref 65–99)
Glucose-Capillary: 300 mg/dL — ABNORMAL HIGH (ref 65–99)
Glucose-Capillary: 227 mg/dL — ABNORMAL HIGH (ref 65–99)

## 2015-09-15 LAB — HEMOGLOBIN A1C
HEMOGLOBIN A1C: 10.3 % — AB (ref 4.8–5.6)
Mean Plasma Glucose: 249 mg/dL

## 2015-09-15 LAB — BASIC METABOLIC PANEL
ANION GAP: 7 (ref 5–15)
BUN: 7 mg/dL (ref 6–20)
CALCIUM: 8.7 mg/dL — AB (ref 8.9–10.3)
CO2: 25 mmol/L (ref 22–32)
Chloride: 105 mmol/L (ref 101–111)
Creatinine, Ser: 0.67 mg/dL (ref 0.44–1.00)
GFR calc Af Amer: >60 mL/min (ref >60)
GLUCOSE: 266 mg/dL — AB (ref 65–99)
Potassium: 3.6 mmol/L (ref 3.5–5.1)
SODIUM: 137 mmol/L (ref 135–145)

## 2015-09-15 LAB — C-REACTIVE PROTEIN: CRP: 9.4 mg/dL — AB (ref <1.0)

## 2015-09-15 LAB — CBC
HCT: 35.4 % — ABNORMAL LOW (ref 36.0–46.0)
Hemoglobin: 11.5 g/dL — ABNORMAL LOW (ref 12.0–15.0)
MCH: 28 pg (ref 26.0–34.0)
MCHC: 32.5 g/dL (ref 30.0–36.0)
MCV: 86.3 fL (ref 78.0–100.0)
PLATELETS: 182 10*3/uL (ref 150–400)
RBC: 4.1 MIL/uL (ref 3.87–5.11)
RDW: 13.1 % (ref 11.5–15.5)
WBC: 7.4 10*3/uL (ref 4.0–10.5)

## 2015-09-15 MED ORDER — ENOXAPARIN SODIUM 80 MG/0.8ML ~~LOC~~ SOLN
0.5000 mg/kg | SUBCUTANEOUS | Status: DC
  Administered 2015-09-16 – 2015-09-17 (×2): 75 mg via SUBCUTANEOUS
  Filled 2015-09-15 (×2): qty 0.8

## 2015-09-15 MED ORDER — VENLAFAXINE HCL ER 75 MG PO CP24: 150.0000 mg | ORAL_CAPSULE | Freq: Once | ORAL | Status: DC

## 2015-09-15 MED ORDER — ACETAMINOPHEN 325 MG PO TABS
650.0000 mg | ORAL_TABLET | Freq: Four times a day (QID) | ORAL | Status: DC | PRN
  Administered 2015-09-15 – 2015-09-16 (×3): 650 mg via ORAL
  Filled 2015-09-15 (×3): qty 2

## 2015-09-15 NOTE — Op Note (Signed)
NAMSamara Snide:  Kiser, Aleece             ACCOUNT NO.:  [id number]  MEDICAL RECORD NO.:  [id number]  LOCATION:  [facility]                         FACILITY:  [facility]  PHYSICIAN:  Dionne AnoWilliam M. Kadin Bera, M.D.DATE OF BIRTH:  1975-06-14  DATE OF PROCEDURE: DATE OF DISCHARGE:                              OPERATIVE REPORT   PREOPERATIVE DIAGNOSIS:  Right middle finger abscess with extensor tendon tenosynovitis.  POSTOPERATIVE DIAGNOSIS:  Right middle finger abscess with extensor tendon tenosynovitis.  PROCEDURE: 1. I and D, deep abscess, right middle finger. 2. Nail plate removal, right middle finger. 3. Extensor tendon limited tenolysis tenosynovectomy secondary to     infectious tenosynovitis.  SURGEON:  Dionne AnoWilliam M. Amanda PeaGramig, M.D.  ASSISTANT:  None.  COMPLICATIONS:  None.  ANESTHESIA:  General.  TOURNIQUET TIME:  Less than an hour.  DRAINS:  One large Adaptic type drain.  CULTURES:  Aerobic and anaerobic taken.  INDICATIONS:  A 40 year old female, poorly-controlled diabetic who presents with above-mentioned diagnosis.  I have counseled in regard to risks and benefits of surgery and she desires to proceed.  She has had a significant infection evolve and certainly has large amount of devitalized tissue at the present time.  I have discussed our hopes to salvage her finger and hand.  She was admitted by the Hospitalist.  OPERATION IN DETAIL:  The patient was seen by myself and Anesthesia. Taken to the operative suite and underwent smooth induction of general anesthesia.  Prepped and draped in usual sterile fashion with Betadine scrub and paint.  Following this, the patient then underwent a very careful and cautious approach to the extremity with time-out being called, body parts well padded, and the operation commenced with lifting of the nail.  Immediate purulence was encountered and was cultured for aerobic, anaerobic culture.  Following this, nail plate removal was accomplished with Irven BaltimoreFreer  elevator.  Following this, I then performed I and D of a deep abscess and went down to the volar pulp tissue as well as the eponychial fold tissue.  This was all decompressed nicely.  Following this, I performed a limited extensor tenolysis, tenosynovectomy.  This was done without difficulty.  There were no complicating features.  I then placed 3 L of saline through and through the area.  There were no complications.  Following this, I placed Adaptic under the drain and deflated the tourniquet.  Refill was excellent.  I scrubbed and made sure that the remaining areas of her adjacent fingers were nice and clean and placed her in a nice sterile bandage.  She will be admitted.  We will plan for elevation, range of motion, and begin hydrotherapy tomorrow.  She is on vancomycin and Zosyn.  She can be tailored to something outpatient once stable.  The patient and I have discussed all issues at length, these notes, etc.  At the present time, I think she will respond; however, I will keep a close eye on her and begin daily hydrotherapy starting tomorrow, which I have ordered.  It was a pleasure to see her today.     Dionne AnoWilliam M. Amanda PeaGramig, M.D.     [facility] Center[facility]G/MEDQ  D:  09/14/2015  T:  09/15/2015  Job:  [id number]

## 2015-09-15 NOTE — Progress Notes (Signed)
Physical Therapy Hydrotherapy Addendum for G-Codes    09/15/15 1142  PT Visit Information  Last PT Received On 09/15/15  PT G-Codes **NOT FOR INPATIENT CLASS**  Functional Assessment Tool Used Clinical judgement  Functional Limitation Other PT primary (Wound care)  Other PT Primary Current Status (Z3086(G8990) CI  Other PT Primary Goal Status (V7846(G8991) CI   Conni SlipperLaura Sulay Brymer, PT, DPT Acute Rehabilitation Services Pager: [phone]

## 2015-09-15 NOTE — Progress Notes (Signed)
Subjective: 1 Day Post-Op Procedure(s) (LRB): IRRIGATION AND DEBRIDEMENT RIGHT MIDDLE FINGER (Right) Patient reports pain as minimal, however she does describe some dysesthesias about the ulnar nerve we have discussed with her this may be posturally related as she has been keeping the arm in a flexed position. The hospitalist is currently in the room during rounds. Patient denies nausea, vomiting, fever or chills. Her family members in the room  Objective: Vital signs in last 24 hours: Temp:  [97.8 F (36.6 C)-99.2 F (37.3 C)] 98.3 F (36.8 C) (07/12 0645) Pulse Rate:  [55-98] 55 (07/12 0645) Resp:  [17-26] 22 (07/12 0645) BP: (96-140)/(56-88) 102/56 mmHg (07/12 0645) SpO2:  [96 %-100 %] 98 % (07/12 0645) Weight:  [148.78 kg (328 lb)] 148.78 kg (328 lb) (07/11 1125)  Intake/Output from previous day: 07/11 0701 - 07/12 0700 In: 400 [I.V.:400] Out: 110 [Urine:100; Blood:10] Intake/Output this shift:     Recent Labs  09/14/15 1638 09/15/15 0612  HGB 12.1 11.5*    Recent Labs  09/14/15 1638 09/15/15 0612  WBC 9.2 7.4  RBC 4.29 4.10  HCT 36.8 35.4*  PLT 201 182    Recent Labs  09/14/15 1638 09/15/15 0612  NA 136 137  K 3.4* 3.6  CL 100* 105  CO2 26 25  BUN <5* 7  CREATININE 0.54 0.67  GLUCOSE 229* 266*  CALCIUM 9.5 8.7*    Recent Labs  09/14/15 2238  INR 1.08   Results for orders placed or performed during the hospital encounter of 09/14/15  Aerobic/Anaerobic Culture (surgical/deep wound)     Status: None (Preliminary result)   Collection Time: 09/14/15  8:50 PM  Result Value Ref Range Status   Specimen Description ABSCESS RIGHT FINGER  Final   Special Requests MIDDLE FINGER  Final   Gram Stain   Final    FEW WBC PRESENT, PREDOMINANTLY PMN ABUNDANT GRAM POSITIVE COCCI IN CLUSTERS FEW SQUAMOUS EPITHELIAL CELLS PRESENT    Culture PENDING  Incomplete   Report Status PENDING  Incomplete    Evaluation of the right upper extremity: Dressings are  removed about the hand and the finger. He has no active purulence present. No signs of ascending cellulitis are present. He does have a positive cubital tunnel compression and the nails.  Assessment/Plan: 1 Day Post-Op Procedure(s) (LRB): IRRIGATION AND DEBRIDEMENT RIGHT MIDDLE FINGER (Right) I have discussed with the patient the need for daily hydrotherapy, diligent wound care and daily wet-to-dry dressing changes about the open area of the finger. We've discussed with her that it will take approximately 4-6 months for a new nail to regrow. We will continue IV antibiotics intent cultures become final and transition her to by mouth antibiotics. It will be imperative that she undergoes daily hydrotherapy and wound care and is competent for dressing changes at home.We have discussed with the patient the issues regarding their infection to the extremity. We will continue antibiotics and await culture results. Often times it will take 3-5 days for cultures to become final. During this time we will typically have the patient on intravenous antibiotics until we can find a parenteral route of antibiotic regime specific for the bacteria or organism isolated. We have discussed with the patient the need for daily irrigation and debridement as well as therapy to the area. We have discussed with the patient the necessity of range of motion to the involved joints as discussed today. We have discussed with the patient the unpredictability of infections at times. We'll continue to work towards  good pain control and restoration of function. The patient understands the need for meticulous wound care and the necessity of proper followup.  The possible complications of stiffness (loss of motion), resistant infection, possible deep bone infection, possible chronic pain issues, possible need for multiple surgeries and even amputation.  With this in mind the patient understands our goal is to eradicate the infection to  quiesence. We will continue to work towards these goals.  [name] [name] L 09/15/2015, 9:28 AM

## 2015-09-15 NOTE — ED Provider Notes (Signed)
CSN: [id number]     Arrival date & time 09/14/15  1103 History   First MD Initiated Contact with Patient 09/14/15 1538     Chief Complaint  Patient presents with  . Joint Swelling     (Consider location/radiation/quality/duration/timing/severity/associated sxs/prior Treatment) Patient is a 40 y.o. female presenting with hand pain.  Hand Pain This is a new problem. The problem occurs constantly. Pertinent negatives include no chest pain and no headaches. Nothing aggravates the symptoms. Nothing relieves the symptoms. She has tried nothing for the symptoms. The treatment provided no relief.    Past Medical History  Diagnosis Date  . Allergy   . Bipolar 1 disorder [facility])     Sees Dr. Ledon Snare, psychology,  [phone]  . Fatigue   . Anemia   . Diabetes mellitus without complication [facility])    Past Surgical History  Procedure Laterality Date  . Abdominal hysterectomy     No family history on file. Social History  Substance Use Topics  . Smoking status: Never Smoker   . Smokeless tobacco: Never Used  . Alcohol Use: 0.0 oz/week    0 Standard drinks or equivalent per week     Comment: occ   OB History    No data available     Review of Systems  Cardiovascular: Negative for chest pain.  Neurological: Negative for headaches.  All other systems reviewed and are negative.     Allergies  Hydromorphone hcl; Meperidine hcl; Morphine; Lisinopril; Sumatriptan; and Zolmitriptan  Home Medications   Prior to Admission medications   Medication Sig Start Date End Date Taking? Authorizing Provider  cetirizine (ZYRTEC) 10 MG tablet Take 10 mg by mouth at bedtime.   Yes Historical Provider, MD  Insulin Glargine (LANTUS SOLOSTAR) 100 UNIT/ML Solostar Pen Inject 20 Units into the skin daily at 10 pm. Patient taking differently: Inject 20-25 Units into the skin daily at 10 pm.  07/03/14  Yes Joanna Puff, MD  lamoTRIgine (LAMICTAL) 100 MG tablet Take 2 tablets (200 mg total) by mouth  daily. Patient taking differently: Take 200 mg by mouth at bedtime.  05/12/15  Yes Joanna Puff, MD  metFORMIN (GLUCOPHAGE-XR) 500 MG 24 hr tablet Take 3 tablets (1,500 mg total) by mouth at bedtime. Patient taking differently: Take 1,000 mg by mouth at bedtime.  03/24/14  Yes Joanna Puff, MD  venlafaxine XR (EFFEXOR-XR) 150 MG 24 hr capsule Take 1 capsule (150 mg total) by mouth daily with breakfast. 06/28/15  Yes Joanna Puff, MD  buPROPion (WELLBUTRIN XL) 150 MG 24 hr tablet Take 1 tablet (150 mg total) by mouth daily. Patient not taking: Reported on 09/14/2015 11/24/14   Joanna Puff, MD  ibuprofen (ADVIL,MOTRIN) 800 MG tablet Take 1 tablet (800 mg total) by mouth every 8 (eight) hours as needed. 10/22/14   Erasmo Downer, MD  meloxicam (MOBIC) 7.5 MG tablet Take 1 tablet (7.5 mg total) by mouth 2 (two) times daily after a meal. 04/21/15   Linna Hoff, MD  promethazine (PHENERGAN) 25 MG tablet Take 1 tablet (25 mg total) by mouth every 6 (six) hours as needed for nausea or vomiting. Patient not taking: Reported on 10/09/2014 10/03/13   Oswaldo Conroy, PA-C   BP 133/77 mmHg  Pulse 85  Temp(Src) 98.6 F (37 C) (Oral)  Resp 18  Ht  (1.727 m)  Wt 328 lb (148.78 kg)  BMI 49.88 kg/m2  SpO2 100% [id number] Exam  Constitutional: She is oriented to person,  place, and time. She appears well-developed and well-nourished.  HENT:  Head: Normocephalic and atraumatic.  Neck: Normal range of motion.  Cardiovascular: Normal rate and regular rhythm.   Pulmonary/Chest: No stridor. No respiratory distress.  Abdominal: Soft. She exhibits no distension. There is no tenderness.  Musculoskeletal:  Right middle finger swollen, ttp, warm, red  Neurological: She is alert and oriented to person, place, and time.  Skin: Skin is warm and dry.  Nursing note and vitals reviewed.   ED Course  Procedures (including critical care time) Labs Review Labs Reviewed  COMPREHENSIVE METABOLIC  PANEL - Abnormal; Notable for the following:    Potassium 3.4 (*)    Chloride 100 (*)    Glucose, Bld 229 (*)    BUN <5 (*)    Albumin 3.4 (*)    AST 14 (*)    All other components within normal limits  GLUCOSE, CAPILLARY - Abnormal; Notable for the following:    Glucose-Capillary 200 (*)    All other components within normal limits  SEDIMENTATION RATE - Abnormal; Notable for the following:    Sed Rate 60 (*)    All other components within normal limits  C-REACTIVE PROTEIN - Abnormal; Notable for the following:    CRP 9.4 (*)    All other components within normal limits  GLUCOSE, CAPILLARY - Abnormal; Notable for the following:    Glucose-Capillary 188 (*)    All other components within normal limits  CBG MONITORING, ED - Abnormal; Notable for the following:    Glucose-Capillary 299 (*)    All other components within normal limits  CULTURE, BLOOD (ROUTINE X 2)  CULTURE, BLOOD (ROUTINE X 2)  AEROBIC/ANAEROBIC CULTURE (SURGICAL/DEEP WOUND)  CBC WITH DIFFERENTIAL/PLATELET  LACTIC ACID, PLASMA  LACTIC ACID, PLASMA  PROTIME-INR  HEMOGLOBIN A1C  CBC  BASIC METABOLIC PANEL    Imaging Review Dg Finger Middle Right  09/14/2015  CLINICAL DATA:  40 year old female with swelling right middle finger. Discoloration of nail. Pus around nail. No known injury. Initial encounter. EXAM: RIGHT MIDDLE FINGER 2+V COMPARISON:  04/17/2010 plain film exam. FINDINGS: Soft tissue swelling suggestive of cellulitis without plain film evidence of osteomyelitis. No fracture or dislocation. IMPRESSION: Soft tissue swelling suggestive of cellulitis without plain film evidence of osteomyelitis. Electronically Signed   By: Lacy DuverneySteven  Olson M.D.   On: 09/14/2015 17:08   I have personally reviewed and evaluated these images and lab results as part of my medical decision-making.   EKG Interpretation None      MDM   Final diagnoses:  Pain    40 year old female here with cellulitis of her right finger  versus early felon. X-ray okay labs okay. However she is right-handed and has diabetes and has been taking Augmentin at home for 3 days without improvement. May need to see a hand surgeon versus continued IV antibiotics so patient needs to be observed in the hospital with continued antibiotics and management.   Discussed patient's case with Family Medicine, Dr. Jimmey RalphParker.  Recommend admission to obs, med-surg bed.  I will place holding orders per their request. Patient and family (if present) updated with plan. [facility].  I reviewed all nursing notes, vitals, pertinent old records, EKGs, labs, imaging (as available).     Marily MemosJason Tayvien Kane, MD 09/15/15 [phone]

## 2015-09-15 NOTE — Progress Notes (Signed)
Inpatient Diabetes Program Recommendations  AACE/ADA: New Consensus Statement on Inpatient Glycemic Control (2015)  Target Ranges:  Prepandial:   less than 140 mg/dL      Peak postprandial:   less than 180 mg/dL (1-2 hours)      Critically ill patients:  140 - 180 mg/dL   Lab Results  Component Value Date   GLUCAP 300* 09/15/2015   HGBA1C 7.6 07/02/2014    Review of Glycemic Control: Results for [name], [name] N (MRN [id number]) as of 09/15/2015 15:28  Ref. Range 09/14/2015 20:15 09/14/2015 21:19 09/14/2015 23:00 09/15/2015 08:00 09/15/2015 12:06  Glucose-Capillary Latest Ref Range: 65-99 mg/dL 409315 (H) 811200 (H) 914188 (H) 234 (H) 300 (H)   Diabetes history: Diabetes mellitus Outpatient Diabetes medications:  Lantus 20 units q HS, Metformin 1000 mg q HS Current orders for Inpatient glycemic control:  Novolog sensitive tid with meals and HS, Lantus 10 units q HS  Inpatient Diabetes Program Recommendations:   Please consider increasing Lantus to 20 units q HS.  Also consider adding Novolog 4 units tid with meals-for CHO coverage.  Thanks, Beryl MeagerJenny Mikala Podoll, RN, BC-ADM Inpatient Diabetes Coordinator Pager [phone] (8a-5p)

## 2015-09-15 NOTE — Progress Notes (Signed)
Physical Therapy Wound Treatment Patient Details  Name: [name] [name] MRN: [id number] Date of Birth: 1975/07/06  Today's Date: 09/15/2015 Time: 6712-4580 Time Calculation (min): 23 min  Subjective  Subjective: Pt pleasant and agreeable to therapy Patient and Family Stated Goals: heal wound Date of Onset:  (Unsure) Prior Treatments:  (I&D 09/14/15)  Pain Score: Pt premedicated prior to session. She reports minimal pain throughout session.   Wound Assessment  Wound / Incision (Open or Dehisced) 09/15/15 Incision - Open Finger (Comment which one) Right Middle (Active)  Dressing Type Compression wrap;Gauze (Comment);Moist to dry;Non adherent 09/15/2015 10:36 AM  Dressing Changed Changed 09/15/2015 10:36 AM  Dressing Status Clean;Dry;Intact 09/15/2015 10:36 AM  Dressing Change Frequency Daily 09/15/2015 10:36 AM  Site / Wound Assessment Red 09/15/2015 10:36 AM  % Wound base Red or Granulating 100% 09/15/2015 10:36 AM  % Wound base Yellow 0% 09/15/2015 10:36 AM  % Wound base Black 0% 09/15/2015 10:36 AM  % Wound base Other (Comment) 0% 09/15/2015 10:36 AM  Peri-wound Assessment Intact;Bleeding 09/15/2015 10:36 AM  Wound Length (cm) 2 cm 09/15/2015 10:36 AM  Wound Width (cm) 1.5 cm 09/15/2015 10:36 AM  Wound Depth (cm) 0.1 cm 09/15/2015 10:36 AM  Margins Unattached edges (unapproximated) 09/15/2015 10:36 AM  Closure None 09/15/2015 10:36 AM  Drainage Amount Minimal 09/15/2015 10:36 AM  Drainage Description Sanguineous 09/15/2015 10:36 AM  Treatment Hydrotherapy (Pulse lavage);Packing (Saline gauze) 09/15/2015 10:36 AM  **Measurements are approximate  Hydrotherapy Pulsed lavage therapy - wound location: R middle finger Pulsed Lavage with Suction (psi): 4 psi (for pain control) Pulsed Lavage with Suction - Normal Saline Used: 500 mL Pulsed Lavage Tip: Tip with splash shield   Wound Assessment and Plan  Wound Therapy - Assess/Plan/Recommendations Wound Therapy - Clinical Statement: Pt presents to  hydrotherapy s/p I&D for R middle finger infection. Pt will benefit from continued hydrotherapy to promote wound bed healing and to decrease bioburden.  Wound Therapy - Functional Problem List: Decreased strength and AROM/acute pain due to infection and now I&D Factors Delaying/Impairing Wound Healing: Diabetes Mellitus;Infection - systemic/local Hydrotherapy Plan: Debridement;Dressing change;Patient/family education;Pulsatile lavage with suction Wound Therapy - Frequency: 6X / week Wound Therapy - Follow Up Recommendations: Home health RN Wound Plan: See above  Wound Therapy Goals- Improve the function of patient's integumentary system by progressing the wound(s) through the phases of wound healing (inflammation - proliferation - remodeling) by: Patient/Family will be able to : complete self dressing changes independently Patient/Family Instruction Goal - Progress: Goal set today Goals/treatment plan/discharge plan were made with and agreed upon by patient/family: Yes Time For Goal Achievement: 7 days Wound Therapy - Potential for Goals: Excellent  Goals will be updated until maximal potential achieved or discharge criteria met.  Discharge criteria: when goals achieved, discharge from hospital, MD decision/surgical intervention, no progress towards goals, refusal/missing three consecutive treatments without notification or medical reason.  GP     Rolinda Roan 09/15/2015, 10:51 AM   Rolinda Roan, PT, DPT Acute Rehabilitation Services Pager: [phone]

## 2015-09-15 NOTE — Progress Notes (Signed)
Family Medicine Teaching Service Daily Progress Note Intern Pager: [phone]  Patient name: [name] [name] Medical record number: [id number] Date of birth: 10-10-1975 Age: 40 y.o. Gender: female  Primary Care Provider: Kathrine Cords, MD Consultants: Orthopedics Code Status: FULL  Pt Overview and Major Events to Date:  7/11 admitted for R 3rd finger cellulitis  Assessment and Plan: [name] [name] is a 40 y.o. female presenting with right third finger cellulitis. PMH is significant for T2DM and Bipolar 1 disorder  Right Third Finger Cellulitis. Plain film without signs of osteomyelitis, though concern for poor distal perfusion. No clear source for infection. No signs of systemic illness. ESR 60, CRP 9.4 - per Hand surgery, Postop day 1 for emergent irrigation debridement, continue hydrotherapy and dressing changes - Continue Vanc/Zosyn given location of infection - Blood cultures x2 pending - Tailor antibiotics pending blood cultures x2, wound culture prelim showing gram pos cocci in clusters  T2DM. On lantus 25U nightly at home. Last A1c 7.6 on 07/02/2014.  - Half home lantus to 10U daily - SSI  - A1c pending  Bipolar 1 Disorder - Continue home lamictal and venlafaxine  FEN/GI: NPO until surgery eval, 1/2NS '@75cc'$  for 12 hours Prophylaxis: Lovenox  Disposition: Admitted pending above management.   Subjective:  Patient states feels well this am. States has been biting her nails and gardening at work before finger started to swell prior to presentation, is R handed. Feels hand pain is under good control with post op pain meds. Feels some numbness and tingling along pinky finger extending down to elbow on medial side that is new since getting out of surgery last night. Discussed plan with orthopedics at bedside. States feels hungry this am.   Objective: Temp:  [97.8 F (36.6 C)-99.2 F (37.3 C)] 98.3 F (36.8 C) (07/12 0645) Pulse Rate:  [55-98] 55 (07/12 0645) Resp:   [17-26] 22 (07/12 0645) BP: (96-140)/(56-88) 102/56 mmHg (07/12 0645) SpO2:  [96 %-100 %] 98 % (07/12 0645) Weight:  [328 lb (148.78 kg)] 328 lb (148.78 kg) (07/11 1125)   Physical Exam: General: Sitting up in bed, in NAD Cardiovascular: RRR, no murmurs appreciated Respiratory: CTAB, normal effort Abdomen: soft, +bs, nt, nd Extremities: R third finger with nail and nail bed surgically removed, minimal drainage, no purulence on palpation, minimal erythema to MCP joint Skin findings noted as above, warm dry. Neuro: Alert, oriented. Numbness and tingling along ulnar nerve with light palpation. Strength 5/5, sensation intact.   Laboratory:  Recent Labs Lab 09/14/15 1638  WBC 9.2  HGB 12.1  HCT 36.8  PLT 201    Recent Labs Lab 09/14/15 1638  NA 136  K 3.4*  CL 100*  CO2 26  BUN <5*  CREATININE 0.54  CALCIUM 9.5  PROT 7.0  BILITOT 0.6  ALKPHOS 76  ALT 14  AST 14*  GLUCOSE 229*   ESR 60  CRP 9.4  Imaging/Diagnostic Tests: Dg Finger Middle Right  09/14/2015  CLINICAL DATA:  40 year old female with swelling right middle finger. Discoloration of nail. Pus around nail. No known injury. Initial encounter. EXAM: RIGHT MIDDLE FINGER 2+V COMPARISON:  04/17/2010 plain film exam. FINDINGS: Soft tissue swelling suggestive of cellulitis without plain film evidence of osteomyelitis. No fracture or dislocation. IMPRESSION: Soft tissue swelling suggestive of cellulitis without plain film evidence of osteomyelitis. Electronically Signed   By: Genia Del M.D.   On: 09/14/2015 17:08    Bufford Lope, DO 09/15/2015, 7:01 AM PGY-1, [place]  Intern pager: [phone], text pages welcome

## 2015-09-15 NOTE — Progress Notes (Signed)
Patient states she is able to place self on and off CPAP. RT made pt aware to call if she needed anything.

## 2015-09-16 LAB — GLUCOSE, CAPILLARY
GLUCOSE-CAPILLARY: 276 mg/dL — AB (ref 65–99)
GLUCOSE-CAPILLARY: 294 mg/dL — AB (ref 65–99)
Glucose-Capillary: 121 mg/dL — ABNORMAL HIGH (ref 65–99)
Glucose-Capillary: 181 mg/dL — ABNORMAL HIGH (ref 65–99)

## 2015-09-16 MED ORDER — VENLAFAXINE HCL ER 75 MG PO CP24
75.0000 mg | ORAL_CAPSULE | Freq: Every day | ORAL | Status: DC
  Administered 2015-09-16: 75 mg via ORAL
  Filled 2015-09-16: qty 1

## 2015-09-16 MED ORDER — INSULIN GLARGINE 100 UNIT/ML ~~LOC~~ SOLN
12.0000 [IU] | Freq: Every day | SUBCUTANEOUS | Status: DC
  Administered 2015-09-16: 12 [IU] via SUBCUTANEOUS
  Filled 2015-09-16 (×2): qty 0.12

## 2015-09-16 MED ORDER — SULFAMETHOXAZOLE-TRIMETHOPRIM 800-160 MG PO TABS
1.0000 | ORAL_TABLET | Freq: Two times a day (BID) | ORAL | Status: DC
  Administered 2015-09-16: 1 via ORAL
  Filled 2015-09-16: qty 1

## 2015-09-16 NOTE — Progress Notes (Signed)
Family Medicine Teaching Service Daily Progress Note Intern Pager: [phone]  Patient name: [name] [name] Medical record number: [id number] Date of birth: [birth year] Age: 40 y.o. Gender: female  Primary Care Provider: Kathrine Cords, MD Consultants: Orthopedics Code Status: FULL  Pt Overview and Major Events to Date:  7/11 admitted for R 3rd finger cellulitis  Assessment and Plan: [name] [name] is a 40 y.o. female presenting with right third finger cellulitis. PMH is significant for T2DM and Bipolar 1 disorder  Right Third Finger Abscess with extensor tendon tenosynovitis. On admit plain film without signs of osteomyelitis, though concern for poor distal perfusion. No clear source for infection possible contribution of carpal tunnel numbness while gardening, biting nails. No signs of systemic illness, remained afebrile throughout hospital stay. ESR 60, CRP 9.4 - per Hand surgery, Postop day 2 for emergent irrigation debridement, continue hydrotherapy and dressing changes - DC vanc/zosyn, start bactrim PO per antibiogram showing good sensitivity of MRSA and MSSA to bactrim. - Blood cultures x2 pending - Tailor antibiotics pending blood cultures x2, wound culture prelim showing staph aureus, pending final result - PT   T2DM. On lantus 25U nightly at home. Last A1c 7.6 on 07/02/2014. A1c 10.3 (7/11) - Increase lantus to 12U daily - SSI   Bipolar 1 Disorder - Continue home lamictal and venlafaxine (decreased to '75mg'$  to match patient history and recent documentation at [facility])  FEN/GI: NPO until surgery eval, 1/2NS '@75cc'$  for 12 hours Prophylaxis: Lovenox  Disposition: Admitted pending above management.   Subjective:  Patient states feels well this am. States finger pain is well controlled, mostly because she has carpal tunnel at baseline. States is concerned because is on Effexor '75mg'$  as outpatient per her psychiatrist and has been getting '150mg'$  this hospital stay. States  tingling feeling in pinky has resolved. Denies fever/chills, n/v, diarrhea. Feels mildly constipated today.  Objective: Temp:  [98.2 F (36.8 C)-98.4 F (36.9 C)] 98.2 F (36.8 C) (07/12 2134) Pulse Rate:  [82-86] 82 (07/12 2134) Resp:  [16-20] 20 (07/12 2134) BP: (113-119)/(66-70) 119/70 mmHg (07/12 2134) SpO2:  [98 %-100 %] 100 % (07/12 2134)   Physical Exam:  General: Sitting up in bed, in NAD Cardiovascular: RRR, no murmurs appreciated Respiratory: CTAB, normal effort Abdomen: soft, +bs, nt, nd Extremities: R third finger with dressing c/d/i no edema or erthyema of R arm/forearm Skin findings noted as above, warm dry. Neuro: Alert, oriented. Strength 5/5, sensation intact. No focal deficits  Laboratory:  Recent Labs Lab 09/14/15 1638 09/15/15 0612  WBC 9.2 7.4  HGB 12.1 11.5*  HCT 36.8 35.4*  PLT 201 182    Recent Labs Lab 09/14/15 1638 09/15/15 0612  NA 136 137  K 3.4* 3.6  CL 100* 105  CO2 26 25  BUN <5* 7  CREATININE 0.54 0.67  CALCIUM 9.5 8.7*  PROT 7.0  --   BILITOT 0.6  --   ALKPHOS 76  --   ALT 14  --   AST 14*  --   GLUCOSE 229* 266*   ESR 60  CRP 9.4  Imaging/Diagnostic Tests: Dg Finger Middle Right  09/14/2015  CLINICAL DATA:  40 year old female with swelling right middle finger. Discoloration of nail. Pus around nail. No known injury. Initial encounter. EXAM: RIGHT MIDDLE FINGER 2+V COMPARISON:  04/17/2010 plain film exam. FINDINGS: Soft tissue swelling suggestive of cellulitis without plain film evidence of osteomyelitis. No fracture or dislocation. IMPRESSION: Soft tissue swelling suggestive of cellulitis without plain film evidence of osteomyelitis.  Electronically Signed   By: Genia Del M.D.   On: 09/14/2015 17:08     Bufford Lope, DO 09/16/2015, 7:20 AM PGY-1, [place] Intern pager: [phone], text pages welcome

## 2015-09-16 NOTE — Progress Notes (Signed)
Subjective: 2 Days Post-Op Procedure(s) (LRB): IRRIGATION AND DEBRIDEMENT RIGHT MIDDLE FINGER (Right) Patient reports pain as minimal at this point. She tolerated therapy without difficulties today. She denies fever or chills   Objective: Vital signs in last 24 hours: Temp:  [98.2 F (36.8 C)-98.7 F (37.1 C)] 98.7 F (37.1 C) (07/13 1523) Pulse Rate:  [74-90] 90 (07/13 1523) Resp:  [17-20] 17 (07/13 1523) BP: (104-119)/(70-75) 104/74 mmHg (07/13 1523) SpO2:  [100 %] 100 % (07/13 1523)  Intake/Output from previous day: 07/12 0701 - 07/13 0700 In: 1160 [P.O.:810; IV Piggyback:350] Out: 400 [Urine:400] Intake/Output this shift:     Recent Labs  09/14/15 1638 09/15/15 0612  HGB 12.1 11.5*    Recent Labs  09/14/15 1638 09/15/15 0612  WBC 9.2 7.4  RBC 4.29 4.10  HCT 36.8 35.4*  PLT 201 182    Recent Labs  09/14/15 1638 09/15/15 0612  NA 136 137  K 3.4* 3.6  CL 100* 105  CO2 26 25  BUN <5* 7  CREATININE 0.54 0.67  GLUCOSE 229* 266*  CALCIUM 9.5 8.7*    Recent Labs  09/14/15 2238  INR 1.08   Results for orders placed or performed during the hospital encounter of 09/14/15  Aerobic/Anaerobic Culture (surgical/deep wound)     Status: None (Preliminary result)   Collection Time: 09/14/15  8:50 PM  Result Value Ref Range Status   Specimen Description ABSCESS RIGHT FINGER  Final   Special Requests MIDDLE FINGER  Final   Gram Stain   Final    FEW WBC PRESENT, PREDOMINANTLY PMN ABUNDANT GRAM POSITIVE COCCI IN CLUSTERS FEW SQUAMOUS EPITHELIAL CELLS PRESENT    Culture ABUNDANT STAPHYLOCOCCUS AUREUS  Final   Report Status PENDING  Incomplete  Culture, blood (Routine X 2) w Reflex to ID Panel     Status: None (Preliminary result)   Collection Time: 09/14/15  9:29 PM  Result Value Ref Range Status   Specimen Description BLOOD LEFT HAND  Final   Special Requests BOTTLES DRAWN AEROBIC ONLY 5CC  Final   Culture NO GROWTH 2 DAYS  Final   Report Status PENDING   Incomplete  Culture, blood (Routine X 2) w Reflex to ID Panel     Status: None (Preliminary result)   Collection Time: 09/14/15  9:38 PM  Result Value Ref Range Status   Specimen Description BLOOD RIGHT ANTECUBITAL  Final   Special Requests BOTTLES DRAWN AEROBIC AND ANAEROBIC  5CC   Final   Culture NO GROWTH 2 DAYS  Final   Report Status PENDING  Incomplete   Examination of the finger once the dressings are removed shows that she has had improvement in the overall swelling with minimal edema present there is no purulent discharge present her soft tissues are stabilizing.  Assessment/Plan: 2 Days Post-Op Procedure(s) (LRB): IRRIGATION AND DEBRIDEMENT RIGHT MIDDLE FINGER (Right) I have redressed her wound at this point in time. We have discussed with her into to discharge for tomorrow. In the improvement in her overall wound conditions I have discussed with her and care at home and over the weekend. She will wash the hand with soap and water pat this dry and then redress it in the manner I have instructed her. She will be able to allow this to area dry in a clean dry environment but otherwise will keep it covered and intact. We will plan on seeing her next Tuesday in our office at 8:30 AM. Hydrotherapy will once again see her tomorrow and instruct  her on dressing changes. Final cultures are not available yet but given this is Staphylococcus aureus, doxycycline may be considered if the final cultures are not complete tomorrow as given her wound conditions she is stable for discharge tomorrow. Would recommend doxycycline 100 mg twice daily for period of 10 days. We look forward to seeing her in our office setting next Tuesday at 8:30 AM.  [name] L 09/16/2015, 4:54 PM

## 2015-09-16 NOTE — Care Management Note (Addendum)
Case Management Note  Patient Details  Name: Ranell PatrickRhonda N Manrique MRN: [id number] Date of Birth: 11/26/1975  Subjective/Objective:                 Patient from home, independent. Cellulitis to finger, on IV Abx, receiving hydrotherapy as inpatient.    Action/Plan:  CM will continue to follow for HH/ wound care center needs.    Addendum 09-17-15- Patient to be educated on wet to dry dressing changes prior to discharge. No home health needs anticipated.   Expected Discharge Date:                  Expected Discharge Plan:     In-House Referral:     Discharge planning Services  CM Consult  Post Acute Care Choice:    Choice offered to:     DME Arranged:    DME Agency:     HH Arranged:    HH Agency:     Status of Service:  In process, will continue to follow  If discussed at Long Length of Stay Meetings, dates discussed:    Additional Comments:  Lawerance SabalDebbie Arushi Partridge, RN 09/16/2015, 3:10 PM

## 2015-09-16 NOTE — Progress Notes (Signed)
Physical Therapy Wound Treatment Patient Details  Name: [name] [name] MRN: [id number] Date of Birth: 02-Jun-1975  Today's Date: 09/16/2015 Time: 0901-0927 Time Calculation (min): 26 min  Subjective  Subjective: Pt pleasant and agreeable to therapy Patient and Family Stated Goals: heal wound Date of Onset:  (Unsure) Prior Treatments:  (I&D 09/14/15)  Pain Score: Pt did not endorse any pain throughout treatment. Declined premedication.  Wound Assessment  Wound / Incision (Open or Dehisced) 09/15/15 Incision - Open Finger (Comment which one) Right Middle (Active)  Dressing Type Compression wrap;Gauze (Comment);Moist to dry;Non adherent 09/16/2015  9:00 AM  Dressing Changed Changed 09/16/2015  9:00 AM  Dressing Status Clean;Dry;Intact 09/16/2015  9:00 AM  Dressing Change Frequency Daily 09/16/2015  9:00 AM  Site / Wound Assessment Red 09/16/2015  9:00 AM  % Wound base Red or Granulating 100% 09/16/2015  9:00 AM  % Wound base Yellow 0% 09/16/2015  9:00 AM  % Wound base Black 0% 09/16/2015  9:00 AM  % Wound base Other (Comment) 0% 09/16/2015  9:00 AM  Peri-wound Assessment Intact;Bleeding 09/16/2015  9:00 AM  Wound Length (cm) 2 cm 09/15/2015 10:36 AM  Wound Width (cm) 1.5 cm 09/15/2015 10:36 AM  Wound Depth (cm) 0.1 cm 09/15/2015 10:36 AM  Margins Unattached edges (unapproximated) 09/16/2015  9:00 AM  Closure None 09/16/2015  9:00 AM  Drainage Amount Minimal 09/16/2015  9:00 AM  Drainage Description Sanguineous 09/16/2015  9:00 AM  Treatment Hydrotherapy (Pulse lavage);Packing (Saline gauze) 09/16/2015  9:00 AM   Hydrotherapy Pulsed lavage therapy - wound location: R middle finger Pulsed Lavage with Suction (psi): 4 psi (for pain control) Pulsed Lavage with Suction - Normal Saline Used: 500 mL Pulsed Lavage Tip: Tip with splash shield   Wound Assessment and Plan  Wound Therapy - Assess/Plan/Recommendations Wound Therapy - Clinical Statement: Pt will benefit from continued hydrotherapy to  promote wound bed healing and to decrease bioburden.  Wound Therapy - Functional Problem List: Decreased strength and AROM/acute pain due to infection and now I&D Factors Delaying/Impairing Wound Healing: Diabetes Mellitus;Infection - systemic/local Hydrotherapy Plan: Debridement;Dressing change;Patient/family education;Pulsatile lavage with suction Wound Therapy - Frequency: 6X / week Wound Therapy - Follow Up Recommendations: Home health RN Wound Plan: See above  Wound Therapy Goals- Improve the function of patient's integumentary system by progressing the wound(s) through the phases of wound healing (inflammation - proliferation - remodeling) by: Patient/Family will be able to : complete self dressing changes independently Patient/Family Instruction Goal - Progress: Progressing toward goal Goals/treatment plan/discharge plan were made with and agreed upon by patient/family: Yes Time For Goal Achievement: 7 days Wound Therapy - Potential for Goals: Excellent  Goals will be updated until maximal potential achieved or discharge criteria met.  Discharge criteria: when goals achieved, discharge from hospital, MD decision/surgical intervention, no progress towards goals, refusal/missing three consecutive treatments without notification or medical reason.  GP     Rolinda Roan 09/16/2015, 9:34 AM   Rolinda Roan, PT, DPT Acute Rehabilitation Services Pager: [phone]

## 2015-09-16 NOTE — Progress Notes (Signed)
Inpatient Diabetes Program Recommendations  AACE/ADA: New Consensus Statement on Inpatient Glycemic Control (2015)  Target Ranges:  Prepandial:   less than 140 mg/dL      Peak postprandial:   less than 180 mg/dL (1-2 hours)      Critically ill patients:  140 - 180 mg/dL   Results for [name] [name], [name] [name] (MRN [id number]) as of 09/16/2015 11:34  Ref. Range 09/15/2015 08:00 09/15/2015 12:06 09/15/2015 17:02 09/15/2015 21:36 09/16/2015 09:57  Glucose-Capillary Latest Ref Range: 65-99 mg/dL 409234 (H) 811300 (H) 914235 (H) 227 (H) 276 (H)   Review of Glycemic Control  Current orders for Inpatient glycemic control: Lantus 10 units QHS, Novolog 0-9 units TID with meals, Novolog 0-5 units QHS  Inpatient Diabetes Program Recommendations: Insulin - Basal: Fasting glucose 276 mg/dl this morning. Please consider increasing Lantus to 15 units QHS. Insulin - Meal Coverage: Please consider ordering Novolog 5 units TID with meals for meal coverage.  Thanks, Orlando PennerMarie Georgio Hattabaugh, RN, MSN, CDE Diabetes Coordinator Inpatient Diabetes Program [phone] (Team Pager from 8am to 5pm) [phone] ([facility] office) [phone] [facility] office) [phone] [facility] office)

## 2015-09-17 LAB — GLUCOSE, CAPILLARY
GLUCOSE-CAPILLARY: 197 mg/dL — AB (ref 65–99)
GLUCOSE-CAPILLARY: 274 mg/dL — AB (ref 65–99)

## 2015-09-17 LAB — CBC
HCT: 35.2 % — ABNORMAL LOW (ref 36.0–46.0)
HEMOGLOBIN: 11.2 g/dL — AB (ref 12.0–15.0)
MCH: 27.8 pg (ref 26.0–34.0)
MCHC: 31.8 g/dL (ref 30.0–36.0)
MCV: 87.3 fL (ref 78.0–100.0)
PLATELETS: 215 10*3/uL (ref 150–400)
RBC: 4.03 MIL/uL (ref 3.87–5.11)
RDW: 12.9 % (ref 11.5–15.5)
WBC: 6.3 10*3/uL (ref 4.0–10.5)

## 2015-09-17 MED ORDER — DOXYCYCLINE HYCLATE 100 MG PO TABS
100.0000 mg | ORAL_TABLET | Freq: Two times a day (BID) | ORAL | Status: DC
  Administered 2015-09-17: 100 mg via ORAL
  Filled 2015-09-17: qty 1

## 2015-09-17 MED ORDER — DOXYCYCLINE HYCLATE 100 MG PO TABS: 100.0000 mg | ORAL_TABLET | Freq: Two times a day (BID) | ORAL | Status: DC

## 2015-09-17 MED ORDER — INSULIN GLARGINE 100 UNIT/ML ~~LOC~~ SOLN
15.0000 [IU] | Freq: Every day | SUBCUTANEOUS | Status: DC
  Filled 2015-09-17: qty 0.15

## 2015-09-17 NOTE — Progress Notes (Signed)
Patient ID: [name] PatrickRhonda N [name], female   DOB: 06/12/1975, [age] y.o.   MRN: [id number] Hand surgery The patient is awake she is doing very well she has no complaints in regards to the hand. We've discussed all issues with her and performed a wound check. The distal tip shows continued improvement no signs of recurrent infection are present her soft tissues are improving. There is no discharge present. Once again discussed with her dressing changes at home and wound care. She will follow-up with us next Tuesday as instructed. She will continue antibiotics orally in the form of doxycycline 100 mg 1 by mouth twice a day as cultures revealed staph Cox R is sensitive to tetracycline. Questions were encouraged and answered. Sheran LawlessBrian L Jhada Risk PA-C

## 2015-09-17 NOTE — Progress Notes (Signed)
Family Medicine Teaching Service Daily Progress Note Intern Pager: [phone]  Patient name: [name] [name] Medical record number: [id number] Date of birth: 1975-10-08 Age: 40 y.o. Gender: female  Primary Care Provider: Kathrine Cords, MD Consultants: Orthopedics Code Status: FULL  Pt Overview and Major Events to Date:  7/11 admitted for R 3rd finger cellulitis  Assessment and Plan: [name] [name] is a 40 y.o. female presenting with right third finger cellulitis. PMH is significant for T2DM and Bipolar 1 disorder  Right Third Finger Abscess with extensor tendon tenosynovitis. On admit plain film without signs of osteomyelitis, though concern for poor distal perfusion. No clear source for infection possible contribution of carpal tunnel numbness while gardening, biting nails. No signs of systemic illness, remained afebrile throughout hospital stay. ESR 60, CRP 9.4. Thiam subjective fever/chills, Temp 99.3 - per Hand surgery, Postop day 3 for emergent irrigation debridement, continue hydrotherapy and dressing changes. Patient competent for dressing changes at home - DC vanc/zosyn, started bactrim (7/13) PO per antibiogram showing good sensitivity of MRSA and MSSA to bactrim, changed to doxycycline (7/14) po per ortho recs for 10 days, last dose 7/23 - Blood cultures x2 pending - Tailor antibiotics pending blood cultures x2, wound culture prelim showing staph aureus, pending final result  - PT   T2DM. On lantus 25U nightly at home. Last A1c 7.6 on 07/02/2014. A1c 10.3 (7/11) - Increased lantus to 12U daily (7/14) CBGs improved 227-294 to 121-181. Increase to lantus 15U - per diabetes coordinator consider additional meal coverage novolog 5U TID, may not be necessary at this time considering CBGs may improved with treatment of infection - SSI   Bipolar 1 Disorder - Continue home lamictal and venlafaxine (decreased to '75mg'$  on 7/14 to match patient history and recent documentation at [facility])  FEN/GI: carb modified diet, famotidine Prophylaxis: Lovenox  Disposition: Admitted pending above management, likely home today   Subjective:  Patient states has had some fevers and chills this am. States finger pain is well controlled, mostly because she has carpal tunnel at baseline. Had BM yesterday. States is concerned about her blood sugars. Denies fever/chills, n/v, diarrhea.   Objective: Temp:  [97.9 F (36.6 C)-99.3 F (37.4 C)] 99.3 F (37.4 C) (07/14 0840) Pulse Rate:  [74-90] 74 (07/14 0556) Resp:  [17-18] 18 (07/14 0556) BP: (104-124)/(74-81) 118/81 mmHg (07/14 0556) SpO2:  [98 %-100 %] 98 % (07/14 0556) FiO2 (%):  [100 %] 100 % (07/13 2100)   Physical Exam:  General: Sitting up in bed, in NAD Cardiovascular: RRR, no murmurs appreciated Respiratory: CTAB, normal effort Abdomen: soft, +bs, nt, nd Extremities: R third finger with dressing c/d/i no edema or erthyema of R arm/forearm Skin findings noted as above, warm dry. Neuro: Alert, oriented. Strength 5/5, sensation intact. No focal deficits  Laboratory:  Recent Labs Lab 09/14/15 1638 09/15/15 0612 09/17/15 1000  WBC 9.2 7.4 6.3  HGB 12.1 11.5* 11.2*  HCT 36.8 35.4* 35.2*  PLT 201 182 215    Recent Labs Lab 09/14/15 1638 09/15/15 0612  NA 136 137  K 3.4* 3.6  CL 100* 105  CO2 26 25  BUN <5* 7  CREATININE 0.54 0.67  CALCIUM 9.5 8.7*  PROT 7.0  --   BILITOT 0.6  --   ALKPHOS 76  --   ALT 14  --   AST 14*  --   GLUCOSE 229* 266*   ESR 60  CRP 9.4  Imaging/Diagnostic Tests: Dg Finger Middle Right  09/14/2015  CLINICAL DATA:  40 year old female with swelling right middle finger. Discoloration of nail. Pus around nail. No known injury. Initial encounter. EXAM: RIGHT MIDDLE FINGER 2+V COMPARISON:  04/17/2010 plain film exam. FINDINGS: Soft tissue swelling suggestive of cellulitis without plain film evidence of osteomyelitis. No fracture or dislocation. IMPRESSION: Soft tissue swelling  suggestive of cellulitis without plain film evidence of osteomyelitis. Electronically Signed   By: Genia Del M.D.   On: 09/14/2015 17:08     Bufford Lope, DO 09/17/2015, 12:15 PM PGY-1, [place] Intern pager: [phone], text pages welcome

## 2015-09-17 NOTE — Progress Notes (Signed)
Nsg Discharge Note  Admit Date:  09/14/2015 Discharge date: 09/17/2015   [name] [name] to be D/C'd Home per MD order.  AVS completed.  Copy for chart, and copy for patient signed, and dated. Patient/caregiver able to verbalize understanding.  Discharge Medication:   Medication List    TAKE these medications        cetirizine 10 MG tablet  Commonly known as:  ZYRTEC  Take 10 mg by mouth at bedtime.     doxycycline 100 MG tablet  Commonly known as:  VIBRA-TABS  Take 1 tablet (100 mg total) by mouth 2 (two) times daily.     Insulin Glargine 100 UNIT/ML Solostar Pen  Commonly known as:  LANTUS SOLOSTAR  Inject 20 Units into the skin daily at 10 pm.     lamoTRIgine 100 MG tablet  Commonly known as:  LAMICTAL  Take 2 tablets (200 mg total) by mouth daily.     meloxicam 7.5 MG tablet  Commonly known as:  MOBIC  Take 1 tablet (7.5 mg total) by mouth 2 (two) times daily after a meal.     metFORMIN 500 MG 24 hr tablet  Commonly known as:  GLUCOPHAGE-XR  Take 3 tablets (1,500 mg total) by mouth at bedtime.     venlafaxine XR 150 MG 24 hr capsule  Commonly known as:  EFFEXOR-XR  Take 1 capsule (150 mg total) by mouth daily with breakfast.        Discharge Assessment: Filed Vitals:   09/17/15 1242 09/17/15 1611  BP:  117/61  Pulse:  82  Temp: 99.3 F (37.4 C)   Resp:     Skin clean, dry and intact without evidence of skin break down, no evidence of skin tears noted. IV catheter discontinued intact. Site without signs and symptoms of complications - no redness or edema noted at insertion site, patient denies c/o pain - only slight tenderness at site.  Dressing with slight pressure applied.  D/c Instructions-Education: Discharge instructions given to patient/family with verbalized understanding. D/c education completed with patient/family including follow up instructions, medication list, d/c activities limitations if indicated, with other d/c instructions as indicated by  MD - patient able to verbalize understanding, all questions fully answered. Patient instructed to return to ED, call 911, or call MD for any changes in condition.  Patient escorted via WC, and D/C home via private auto.  Kern ReapBrumagin, Gemini Beaumier L, RN 09/17/2015 5:05 PM

## 2015-09-17 NOTE — Progress Notes (Signed)
Physical Therapy Wound Treatment Patient Details  Name: [name] [name] MRN: [id number] Date of Birth: [birth year]  Today's Date: 09/17/2015 Time: 5188-4166 Time Calculation (min): 20 min  Subjective  Subjective: Pt pleasant and agreeable to therapy Patient and Family Stated Goals: heal wound Date of Onset:  (Unsure) Prior Treatments:  (I&D 09/14/15)  Pain Score:   Pt reports no pain throughout session.   Wound Assessment  Wound / Incision (Open or Dehisced) 09/15/15 Incision - Open Finger (Comment which one) Right Middle (Active)  Dressing Type Compression wrap;Gauze (Comment);Moist to dry;Non adherent 09/17/2015 10:07 AM  Dressing Changed Changed 09/17/2015 10:07 AM  Dressing Status Clean;Dry;Intact 09/17/2015 10:07 AM  Dressing Change Frequency Daily 09/17/2015 10:07 AM  Site / Wound Assessment Red 09/17/2015 10:07 AM  % Wound base Red or Granulating 100% 09/17/2015 10:07 AM  % Wound base Yellow 0% 09/17/2015 10:07 AM  % Wound base Black 0% 09/17/2015 10:07 AM  % Wound base Other (Comment) 0% 09/17/2015 10:07 AM  Peri-wound Assessment Intact;Bleeding 09/17/2015 10:07 AM  Wound Length (cm) 2 cm 09/15/2015 10:36 AM  Wound Width (cm) 1.5 cm 09/15/2015 10:36 AM  Wound Depth (cm) 0.1 cm 09/15/2015 10:36 AM  Margins Unattached edges (unapproximated) 09/17/2015 10:07 AM  Closure None 09/17/2015 10:07 AM  Drainage Amount Minimal 09/17/2015 10:07 AM  Drainage Description Sanguineous 09/17/2015 10:07 AM  Treatment Hydrotherapy (Pulse lavage);Packing (Saline gauze) 09/17/2015 10:07 AM   Hydrotherapy Pulsed lavage therapy - wound location: R middle finger Pulsed Lavage with Suction (psi): 8 psi (for pain control) Pulsed Lavage with Suction - Normal Saline Used: 500 mL Pulsed Lavage Tip: Tip with splash shield   Wound Assessment and Plan  Wound Therapy - Assess/Plan/Recommendations Wound Therapy - Clinical Statement: Pt able to complete a portion of dressing change with assistance. Pt will benefit  from continued hydrotherapy to promote wound bed healing and to decrease bioburden.  Wound Therapy - Functional Problem List: Decreased strength and AROM/acute pain due to infection and now I&D Factors Delaying/Impairing Wound Healing: Diabetes Mellitus;Infection - systemic/local Hydrotherapy Plan: Debridement;Dressing change;Patient/family education;Pulsatile lavage with suction Wound Therapy - Frequency: 6X / week Wound Therapy - Follow Up Recommendations: Home health RN Wound Plan: See above  Wound Therapy Goals- Improve the function of patient's integumentary system by progressing the wound(s) through the phases of wound healing (inflammation - proliferation - remodeling) by: Patient/Family will be able to : complete self dressing changes independently Patient/Family Instruction Goal - Progress: Progressing toward goal Goals/treatment plan/discharge plan were made with and agreed upon by patient/family: Yes Time For Goal Achievement: 7 days Wound Therapy - Potential for Goals: Excellent  Goals will be updated until maximal potential achieved or discharge criteria met.  Discharge criteria: when goals achieved, discharge from hospital, MD decision/surgical intervention, no progress towards goals, refusal/missing three consecutive treatments without notification or medical reason.  GP     Rolinda Roan 09/17/2015, 10:12 AM   Rolinda Roan, PT, DPT Acute Rehabilitation Services Pager: [phone]

## 2015-09-17 NOTE — Discharge Instructions (Signed)
You were admitted for an abscess of your right 3rd finger that was drained by orthopedic surgery. Please continue to take your antibiotics for 9 more days and follow up with orthopedic surgery.  Cellulitis Cellulitis is an infection of the skin and the tissue beneath it. The infected area is usually red and tender. Cellulitis occurs most often in the arms and lower legs.  CAUSES  Cellulitis is caused by bacteria that enter the skin through cracks or cuts in the skin. The most common types of bacteria that cause cellulitis are staphylococci and streptococci. SIGNS AND SYMPTOMS   Redness and warmth.  Swelling.  Tenderness or pain.  Fever. DIAGNOSIS  Your health care provider can usually determine what is wrong based on a physical exam. Blood tests may also be done. TREATMENT  Treatment usually involves taking an antibiotic medicine. HOME CARE INSTRUCTIONS   Take your antibiotic medicine as directed by your health care provider. Finish the antibiotic even if you start to feel better.  Keep the infected arm or leg elevated to reduce swelling.  Apply a warm cloth to the affected area up to 4 times per day to relieve pain.  Take medicines only as directed by your health care provider.  Keep all follow-up visits as directed by your health care provider. SEEK MEDICAL CARE IF:   You notice red streaks coming from the infected area.  Your red area gets larger or turns dark in color.  Your bone or joint underneath the infected area becomes painful after the skin has healed.  Your infection returns in the same area or another area.  You notice a swollen bump in the infected area.  You develop new symptoms.  You have a fever. SEEK IMMEDIATE MEDICAL CARE IF:   You feel very sleepy.  You develop vomiting or diarrhea.  You have a general ill feeling (malaise) with muscle aches and pains.   This information is not intended to replace advice given to you by your health care  provider. Make sure you discuss any questions you have with your health care provider.   Document Released: 11/30/2004 Document Revised: 11/11/2014 Document Reviewed: 05/08/2011 Elsevier Interactive Patient Education [employer]2016 Elsevier Inc.

## 2015-09-17 NOTE — Progress Notes (Signed)
Placed pt. on CPAP for h/s, <'d CPAP pressure from 14 to 12 per pt. c/o "pressure to strong", tolerating well, told to notify if needed.

## 2015-09-17 NOTE — Discharge Summary (Signed)
Family Medicine Teaching [facility] Discharge Summary  Patient name: [name] [name] Medical record number: [id number] Date of birth: 08/23/1975 Age: 40 y.o. Gender: female Date of Admission: 09/14/2015  Date of Discharge: 09/17/15 Admitting Physician: Moses MannersWilliam A Hensel, MD  Primary Care Provider: Rodrigo Ranrystal Dorsey, MD Consultants: Orthopedics  Indication for Hospitalization: Finger abscess  Discharge Diagnoses/Problem List:  Right Third Finger Abscess with extensor tendon tenosynovitis Carpal tunnel syndrome T2DM Bipolar disorder  Disposition: home  Discharge Condition: stable, improved  Discharge Exam:  General: Sitting up in bed, in NAD Cardiovascular: RRR, no murmurs appreciated Respiratory: CTAB, normal effort Abdomen: soft, +bs, nt, nd Extremities: R third finger with dressing c/d/i no edema or erthyema of R arm/forearm Skin findings noted as above, warm dry. Neuro: Alert, oriented. Strength 5/5, sensation intact. No focal deficits  Brief Hospital Course:  [name] PatrickRhonda N Ra is a 40 y.o. female presenting with right third finger cellulitis. PMH is significant for T2DM, carpal tunnel, and Bipolar 1 disorder  Patient had R middle finger pain and swelling starting 3 days prior to day of presentation. 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 Plain film was without signs of osteomyelitis but distal third of the digit was blanched in appearance without capillary refill and the nail bed had purple discoloration. Hand surgery emergently performed irrigation debridement. Post op she had wet to dry dressings and hydrotherapy throughout her hospital stay. Wound culture grew staph aureus without anaerobes sensitive to vancomycin and doxycycline. She received vancomycin and zosyn during her hospital stay with improvement of her finger and was discharged on  doxycycline for 10 days.  The remainder of her chronic medical conditions were stable and no changes were made to her home medications.  Issues for Follow Up:  1. R middle finger abcess s/p irrigation debridgement now on doxycycline for total of 10 days. Patient will follow up with Orthopedics on 09/21/15.  Significant Procedures: Irrigation debridement of R middle finger  Significant Labs and Imaging:   Recent Labs Lab 09/14/15 1638 09/15/15 0612 09/17/15 1000  WBC 9.2 7.4 6.3  HGB 12.1 11.5* 11.2*  HCT 36.8 35.4* 35.2*  PLT 201 182 215    Recent Labs Lab 09/14/15 1638 09/15/15 0612  NA 136 137  K 3.4* 3.6  CL 100* 105  CO2 26 25  GLUCOSE 229* 266*  BUN <5* 7  CREATININE 0.54 0.67  CALCIUM 9.5 8.7*  ALKPHOS 76  --   AST 14*  --   ALT 14  --   ALBUMIN 3.4*  --    Dg Finger Middle Right  09/14/2015  CLINICAL DATA:  40 year old female with swelling right middle finger. Discoloration of nail. Pus around nail. No known injury. Initial encounter. EXAM: RIGHT MIDDLE FINGER 2+V COMPARISON:  04/17/2010 plain film exam. FINDINGS: Soft tissue swelling suggestive of cellulitis without plain film evidence of osteomyelitis. No fracture or dislocation. IMPRESSION: Soft tissue swelling suggestive of cellulitis without plain film evidence of osteomyelitis. Electronically Signed   By: Lacy DuverneySteven  Olson M.D.   On: 09/14/2015 17:08    Results/Tests Pending at Time of Discharge: wound culture in progress for total of 5 days  Discharge Medications:    Medication List    TAKE these medications        cetirizine 10 MG tablet  Commonly known as:  ZYRTEC  Take 10 mg by  mouth at bedtime.     doxycycline 100 MG tablet  Commonly known as:  VIBRA-TABS  Take 1 tablet (100 mg total) by mouth 2 (two) times daily.     Insulin Glargine 100 UNIT/ML Solostar Pen  Commonly known as:  LANTUS SOLOSTAR  Inject 20 Units into the skin daily at 10 pm.     lamoTRIgine 100 MG tablet  Commonly known as:   LAMICTAL  Take 2 tablets (200 mg total) by mouth daily.     meloxicam 7.5 MG tablet  Commonly known as:  MOBIC  Take 1 tablet (7.5 mg total) by mouth 2 (two) times daily after a meal.     metFORMIN 500 MG 24 hr tablet  Commonly known as:  GLUCOPHAGE-XR  Take 3 tablets (1,500 mg total) by mouth at bedtime.     venlafaxine XR 150 MG 24 hr capsule  Commonly known as:  EFFEXOR-XR  Take 1 capsule (150 mg total) by mouth daily with breakfast.        Discharge Instructions: Please refer to Patient Instructions section of EMR for full details.  Patient was counseled important signs and symptoms that should prompt return to medical care, changes in medications, dietary instructions, activity restrictions, and follow up appointments.   Follow-Up Appointments:     Follow-up Information    Follow up with BUCHANAN,BRIAN L, PA-C. Go on 09/21/2015.   Specialty:  Orthopedic Surgery   Why:  appointment Tuesday at 8:30am, hospital follow up   Contact information:   [address] [place] [address] [phone]       Follow up with Rodrigo Ran, MD. Schedule an appointment as soon as possible for a visit in 1 week.   Specialty:  Family Medicine   Why:  hospital follow up   Contact information:   [address] [place] [address] [place] [address] [phone]       Leland Her, DO 09/17/2015, 2:38 PM PGY-1, [facility] Family [facility]

## 2015-09-17 NOTE — Progress Notes (Signed)
Inpatient Diabetes Program Recommendations  AACE/ADA: New Consensus Statement on Inpatient Glycemic Control (2015)  Target Ranges:  Prepandial:   less than 140 mg/dL      Peak postprandial:   less than 180 mg/dL (1-2 hours)      Critically ill patients:  140 - 180 mg/dL   Results for [name] [name], [name] [name] (MRN [id number]) as of 09/17/2015 10:16  Ref. Range 09/16/2015 09:57 09/16/2015 11:52 09/16/2015 17:58 09/16/2015 21:03 09/17/2015 08:37  Glucose-Capillary Latest Ref Range: 65-99 mg/dL 528276 (H) 413294 (H) 244121 (H) 181 (H) 197 (H)   Review of Glycemic Control   Current orders for Inpatient glycemic control: Lantus 12 units QHS, Novolog 0-9 units TID with meals, Novolog 0-5 units QHS  Inpatient Diabetes Program Recommendations: Insulin - Basal: Fasting glucose 197 mg/dl this morning. Please consider increasing Lantus to 15 units QHS. Insulin - Meal Coverage: Please consider ordering Novolog 3 units TID with meals for meal coverage if patient is eating at least 50% of meals.  Thanks, Orlando PennerMarie Makel Mcmann, RN, MSN, CDE Diabetes Coordinator Inpatient Diabetes Program [phone] (Team Pager from 8am to 5pm) [phone] ([facility] office) [phone] [facility] office) [phone] [facility] office)

## 2015-09-18 ENCOUNTER — Encounter (HOSPITAL_COMMUNITY): Payer: Self-pay | Admitting: Emergency Medicine

## 2015-09-18 ENCOUNTER — Telehealth: Payer: Self-pay | Admitting: Student

## 2015-09-18 ENCOUNTER — Emergency Department (HOSPITAL_COMMUNITY)
Admission: EM | Admit: 2015-09-18 | Discharge: 2015-09-18 | Disposition: A | Discharge status: Home / Self Care | Payer: MEDICAID | Attending: Emergency Medicine | Admitting: Emergency Medicine

## 2015-09-18 DIAGNOSIS — E1165 Type 2 diabetes mellitus with hyperglycemia: Secondary | ICD-10-CM | POA: Insufficient documentation

## 2015-09-18 DIAGNOSIS — R739 Hyperglycemia, unspecified: Secondary | ICD-10-CM

## 2015-09-18 DIAGNOSIS — Z79899 Other long term (current) drug therapy: Secondary | ICD-10-CM | POA: Insufficient documentation

## 2015-09-18 DIAGNOSIS — Z794 Long term (current) use of insulin: Secondary | ICD-10-CM | POA: Insufficient documentation

## 2015-09-18 DIAGNOSIS — L02511 Cutaneous abscess of right hand: Secondary | ICD-10-CM | POA: Insufficient documentation

## 2015-09-18 DIAGNOSIS — Z7984 Long term (current) use of oral hypoglycemic drugs: Secondary | ICD-10-CM | POA: Insufficient documentation

## 2015-09-18 LAB — CBC WITH DIFFERENTIAL/PLATELET
BASOS ABS: 0 10*3/uL (ref 0.0–0.1)
Basophils Relative: 0 %
Eosinophils Absolute: 0.2 10*3/uL (ref 0.0–0.7)
Eosinophils Relative: 2 %
HEMATOCRIT: 36.6 % (ref 36.0–46.0)
HEMOGLOBIN: 11.9 g/dL — AB (ref 12.0–15.0)
LYMPHS PCT: 37 %
Lymphs Abs: 2.7 10*3/uL (ref 0.7–4.0)
MCH: 28.2 pg (ref 26.0–34.0)
MCHC: 32.5 g/dL (ref 30.0–36.0)
MCV: 86.7 fL (ref 78.0–100.0)
Monocytes Absolute: 0.4 10*3/uL (ref 0.1–1.0)
Monocytes Relative: 6 %
NEUTROS ABS: 3.9 10*3/uL (ref 1.7–7.7)
NEUTROS PCT: 55 %
Platelets: 225 10*3/uL (ref 150–400)
RBC: 4.22 MIL/uL (ref 3.87–5.11)
RDW: 12.8 % (ref 11.5–15.5)
WBC: 7.2 10*3/uL (ref 4.0–10.5)

## 2015-09-18 LAB — I-STAT BETA HCG BLOOD, ED (MC, WL, AP ONLY): I-stat hCG, quantitative: <5 m[IU]/mL (ref <5)

## 2015-09-18 LAB — URINALYSIS, ROUTINE W REFLEX MICROSCOPIC
Bilirubin Urine: NEGATIVE
Ketones, ur: NEGATIVE mg/dL
LEUKOCYTES UA: NEGATIVE
Nitrite: NEGATIVE
PROTEIN: NEGATIVE mg/dL
Specific Gravity, Urine: 1.025 (ref 1.005–1.030)
pH: 7 (ref 5.0–8.0)

## 2015-09-18 LAB — COMPREHENSIVE METABOLIC PANEL
ALT: 14 U/L (ref 14–54)
AST: 15 U/L (ref 15–41)
Albumin: 3.4 g/dL — ABNORMAL LOW (ref 3.5–5.0)
Alkaline Phosphatase: 84 U/L (ref 38–126)
Anion gap: 7 (ref 5–15)
BILIRUBIN TOTAL: 0.7 mg/dL (ref 0.3–1.2)
BUN: 7 mg/dL (ref 6–20)
CHLORIDE: 101 mmol/L (ref 101–111)
CO2: 27 mmol/L (ref 22–32)
CREATININE: 0.83 mg/dL (ref 0.44–1.00)
Calcium: 9.9 mg/dL (ref 8.9–10.3)
GFR calc Af Amer: >60 mL/min (ref >60)
GLUCOSE: 463 mg/dL — AB (ref 65–99)
Potassium: 3.7 mmol/L (ref 3.5–5.1)
Sodium: 135 mmol/L (ref 135–145)
TOTAL PROTEIN: 7.4 g/dL (ref 6.5–8.1)

## 2015-09-18 LAB — URINE MICROSCOPIC-ADD ON

## 2015-09-18 LAB — CBG MONITORING, ED
Glucose-Capillary: 432 mg/dL — ABNORMAL HIGH (ref 65–99)
Glucose-Capillary: 268 mg/dL — ABNORMAL HIGH (ref 65–99)

## 2015-09-18 MED ORDER — INSULIN ASPART 100 UNIT/ML ~~LOC~~ SOLN
10.0000 [IU] | Freq: Once | SUBCUTANEOUS | Status: AC
  Administered 2015-09-18: 10 [IU] via SUBCUTANEOUS
  Filled 2015-09-18: qty 1

## 2015-09-18 NOTE — ED Notes (Signed)
Pt ambulatory to room from triage with tech; steady gait noted

## 2015-09-18 NOTE — ED Notes (Signed)
Pt c/o hyperglycemia.  Pt was discharged from hosp yesterday after having surg.  St's while she was in the hosp she was receiving Lantus and a fast acting insulin.  Pt st's her glucose has been high since she went home.  CBG at this time is 432

## 2015-09-18 NOTE — Telephone Encounter (Signed)
Callled patient in response to the page. She says her blood sugar is over 500. She states she has been urinating frequently. She denies fever or dysuria. She reports some chills. So I recommended she should come to emergency department for evaluation as soon as possible as I cannot rule out DKA, HHS or pyelonephritis over the phone.  Voiced understanding and agree to do so.

## 2015-09-18 NOTE — ED Provider Notes (Signed)
CSN: [id number]     Arrival date & time 09/18/15  1953 History   First MD Initiated Contact with Patient 09/18/15 2203     Chief Complaint  Patient presents with  . Hyperglycemia     (Consider location/radiation/quality/duration/timing/severity/associated sxs/prior Treatment) HPI [name] [name] is a 40 y.o. female with history of bipolar disorder and diabetes, presents to emergency department complaining of hyperglycemia. Patient states that her blood sugar was 500 yesterday and again today. She states her normal blood sugars are around 300. She takes Lantus at nighttime and metformin for her diabetes. She reports she was just discharged from the hospital after being treated for finger abscess and tenosynovitis of extensor tendon. She is discharged on doxycycline. She states while in the hospital she was given a lower dose of Lantus and supplemental insulin with meals. When she left yesterday she took her regular dose of Lantus last night and restarted her metformin. She states that she has not been eating as much, states she ate some Cheerios today for lunch and some nudles with tuna for dinner. She states that she continues to have "low-grade fever," reports her temperatures have been in 99. She reports urinary frequency and urgency, and states she has been incontinent because she cannot get to the bathroom on time today and yesterday. States her finger is healing well and denies any pain and not finger. Denies any other changes to her medications. She called her doctor today and was told to come to the hospital.  Past Medical History  Diagnosis Date  . Allergy   . Bipolar 1 disorder [facility](HCC)     Sees Dr. Ledon SnareMcKnight, psychology,  [phone]  . Fatigue   . Anemia   . Diabetes mellitus without complication [facility](HCC)    Past Surgical History  Procedure Laterality Date  . Abdominal hysterectomy    . I&d extremity Right 09/14/2015    Procedure: IRRIGATION AND DEBRIDEMENT RIGHT MIDDLE FINGER;   Surgeon: Dominica SeverinWilliam Gramig, MD;  Location: [facility] OR;  Service: Orthopedics;  Laterality: Right;   No family history on file. Social History  Substance Use Topics  . Smoking status: Never Smoker   . Smokeless tobacco: Never Used  . Alcohol Use: 0.0 oz/week    0 Standard drinks or equivalent per week     Comment: occ   OB History    No data available     Review of Systems  Constitutional: Negative for fever and chills.  Respiratory: Negative for cough, chest tightness and shortness of breath.   Cardiovascular: Negative for chest pain, palpitations and leg swelling.  Genitourinary: Positive for frequency. Negative for dysuria, flank pain and pelvic pain.  Musculoskeletal: Negative for myalgias, neck pain and neck stiffness.  Skin: Positive for wound. Negative for rash.  Neurological: Negative for dizziness, weakness and headaches.  All other systems reviewed and are negative.     Allergies  Hydromorphone hcl; Meperidine hcl; Morphine; Lisinopril; Sumatriptan; and Zolmitriptan  Home Medications   Prior to Admission medications   Medication Sig Start Date End Date Taking? Authorizing Provider  cetirizine (ZYRTEC) 10 MG tablet Take 10 mg by mouth as needed for allergies.    Yes Historical Provider, MD  doxycycline (VIBRA-TABS) 100 MG tablet Take 1 tablet (100 mg total) by mouth 2 (two) times daily. 09/17/15  Yes Marquette SaaAbigail Joseph Lancaster, MD  Insulin Glargine (LANTUS SOLOSTAR) 100 UNIT/ML Solostar Pen Inject 20 Units into the skin daily at 10 pm. 07/03/14  Yes Joanna Puffrystal S Dorsey, MD  lamoTRIgine (LAMICTAL)  100 MG tablet Take 2 tablets (200 mg total) by mouth daily. Patient taking differently: Take 200 mg by mouth at bedtime.  05/12/15  Yes Joanna Puff, MD  metFORMIN (GLUCOPHAGE-XR) 500 MG 24 hr tablet Take 3 tablets (1,500 mg total) by mouth at bedtime. Patient taking differently: Take 1,000 mg by mouth at bedtime.  03/24/14  Yes Joanna Puff, MD  promethazine (PHENERGAN) 25 MG tablet  Take 25 mg by mouth every 6 (six) hours as needed for nausea or vomiting.   Yes Historical Provider, MD  venlafaxine XR (EFFEXOR-XR) 150 MG 24 hr capsule Take 1 capsule (150 mg total) by mouth daily with breakfast. 06/28/15  Yes Joanna Puff, MD  meloxicam (MOBIC) 7.5 MG tablet Take 1 tablet (7.5 mg total) by mouth 2 (two) times daily after a meal. Patient not taking: Reported on 09/18/2015 04/21/15   Linna Hoff, MD   BP 143/92 mmHg  Pulse 94  Temp(Src) 99.5 F (37.5 C) (Oral)  Resp 18  Ht  (1.727 m)  Wt 142.996 kg  BMI 47.94 kg/m2  SpO2 100% Physical Exam  Constitutional: She appears well-developed and well-nourished. No distress.  HENT:  Head: Normocephalic.  Eyes: Conjunctivae are normal.  Neck: Neck supple.  Cardiovascular: Normal rate, regular rhythm and normal heart sounds.   Pulmonary/Chest: Effort normal and breath sounds normal. No respiratory distress. She has no wheezes. She has no rales.  Musculoskeletal: She exhibits no edema.  Open wound to the distal dorsal right index finger, with removed fingernail. Finger appears to be normal with no erythema or major swelling. No drainage from the wound.  Neurological: She is alert.  Skin: Skin is warm and dry.  Psychiatric: She has a normal mood and affect. Her behavior is normal.  Nursing note and vitals reviewed.   ED Course  Procedures (including critical care time) Labs Review Labs Reviewed  URINALYSIS, ROUTINE W REFLEX MICROSCOPIC (NOT AT [facility]) - Abnormal; Notable for the following:    Color, Urine STRAW (*)    Glucose, UA >1000 (*)    Hgb urine dipstick MODERATE (*)    All other components within normal limits  CBC WITH DIFFERENTIAL/PLATELET - Abnormal; Notable for the following:    Hemoglobin 11.9 (*)    All other components within normal limits  COMPREHENSIVE METABOLIC PANEL - Abnormal; Notable for the following:    Glucose, Bld 463 (*)    Albumin 3.4 (*)    All other components within normal limits   URINE MICROSCOPIC-ADD ON - Abnormal; Notable for the following:    Squamous Epithelial / LPF 0-5 (*)    Bacteria, UA RARE (*)    All other components within normal limits  CBG MONITORING, ED - Abnormal; Notable for the following:    Glucose-Capillary 432 (*)    All other components within normal limits  CBG MONITORING, ED  I-STAT BETA HCG BLOOD, ED ([facility], [facility], [facility] ONLY)    Imaging Review No results found. I have personally reviewed and evaluated these images and lab results as part of my medical decision-making.   EKG Interpretation None      MDM   Final diagnoses:  Hyperglycemia  Abscess of finger, right   Patient in emergency department with hyperglycemia. She was discharged yesterday after being treated for finger abscess with tenosynovitis. Sent home on doxycycline. Patient is very upset that her sugars are high. I discussed with her careful diet control and exercising. Patient does not exercise. She also stated that  she eat cereal and noodles today which I told her she needs to be careful, however then she stated she didn't eat anything at all because doxycycline is making her nauseated. She is upset that she was discharged home with doxycycline and was not explained all the side effects. She also is upset that we are discussing her diet, but when she was in the hospital she was not given a carbohydrate restricted diet and was not told she needed to be on one. Blood work today shows blood sugar of 463. There is no evidence of DKA, she has anion gap of 7 with no ketones in her urine. She does not appear to be ill. Finger appears to be healing well with no evidence of worsening infection. I discussed her case with family practice, who agreed patient does not need admission at this time. Have given her 10 units of insulin subcutaneous, recheck CBG is 268. Will discharge her home with increased dose of Lantus, 25 units at bedtime and continue metformin. Again emphasized careful diet control  to which patient became upset. She also upset that she is going to go home with no changes in her medications and her blood sugars will still be high. I explained to her that her sugars may be high because of infection and once that improving her sugars will come down to her regular. Patient does state that her normal blood sugars are around 300 which is only 100 more today. Patient was also given an appointment for Tuesday at 945 for follow-up. I instructed her to call her doctor if her sugars are not coming down.  Filed Vitals:   09/18/15 2005 09/18/15 2115 09/18/15 2245 09/18/15 2300  BP: 148/100 143/92 104/89   Pulse: 100 94 97   Temp: 99.5 F (37.5 C) 99.5 F (37.5 C)  99 F (37.2 C)  TempSrc: Oral Oral    Resp: 16 18  19   Height: 5\' 8"  (1.727 m)     Weight: 142.996 kg     SpO2: 97% 100% 97%      Jaynie Crumble, PA-C 09/18/15 2332  Bethann Berkshire, MD 09/18/15 2344

## 2015-09-18 NOTE — Discharge Instructions (Signed)
Increase lantus to 25 units at night time. Make sure to take your metformin. I would recommend not skipping any doses of doxycyline. Eat plenty of protein and healthy vegetables. Avoid any sugars or starches. Make sure to exercise for 30 min at least 5 days a week. You have an appointment with your doctor for Tuesday at 9:45, if unable to make it, make sure to call them to reschedule. If continue to have high blood sugar call your doctor again and let them know.   Hyperglycemia Hyperglycemia occurs when the glucose (sugar) in your blood is too high. Hyperglycemia can happen for many reasons, but it most often happens to people who do not know they have diabetes or are not managing their diabetes properly.  CAUSES  Whether you have diabetes or not, there are other causes of hyperglycemia. Hyperglycemia can occur when you have diabetes, but it can also occur in other situations that you might not be as aware of, such as: Diabetes  If you have diabetes and are having problems controlling your blood glucose, hyperglycemia could occur because of some of the following reasons:  Not following your meal plan.  Not taking your diabetes medications or not taking it properly.  Exercising less or doing less activity than you normally do.  Being sick. Pre-diabetes  This cannot be ignored. Before people develop Type 2 diabetes, they almost always have "pre-diabetes." This is when your blood glucose levels are higher than normal, but not yet high enough to be diagnosed as diabetes. Research has shown that some long-term damage to the body, especially the heart and circulatory system, may already be occurring during pre-diabetes. If you take action to manage your blood glucose when you have pre-diabetes, you may delay or prevent Type 2 diabetes from developing. Stress  If you have diabetes, you may be "diet" controlled or on oral medications or insulin to control your diabetes. However, you may find that your  blood glucose is higher than usual in the hospital whether you have diabetes or not. This is often referred to as "stress hyperglycemia." Stress can elevate your blood glucose. This happens because of hormones put out by the body during times of stress. If stress has been the cause of your high blood glucose, it can be followed regularly by your caregiver. That way he/she can make sure your hyperglycemia does not continue to get worse or progress to diabetes. Steroids  Steroids are medications that act on the infection fighting system (immune system) to block inflammation or infection. One side effect can be a rise in blood glucose. Most people can produce enough extra insulin to allow for this rise, but for those who cannot, steroids make blood glucose levels go even higher. It is not unusual for steroid treatments to "uncover" diabetes that is developing. It is not always possible to determine if the hyperglycemia will go away after the steroids are stopped. A special blood test called an A1c is sometimes done to determine if your blood glucose was elevated before the steroids were started. SYMPTOMS  Thirsty.  Frequent urination.  Dry mouth.  Blurred vision.  Tired or fatigue.  Weakness.  Sleepy.  Tingling in feet or leg. DIAGNOSIS  Diagnosis is made by monitoring blood glucose in one or all of the following ways:  A1c test. This is a chemical found in your blood.  Fingerstick blood glucose monitoring.  Laboratory results. TREATMENT  First, knowing the cause of the hyperglycemia is important before the hyperglycemia can be  treated. Treatment may include, but is not be limited to:  Education.  Change or adjustment in medications.  Change or adjustment in meal plan.  Treatment for an illness, infection, etc.  More frequent blood glucose monitoring.  Change in exercise plan.  Decreasing or stopping steroids.  Lifestyle changes. HOME CARE INSTRUCTIONS   Test your blood  glucose as directed.  Exercise regularly. Your caregiver will give you instructions about exercise. Pre-diabetes or diabetes which comes on with stress is helped by exercising.  Eat wholesome, balanced meals. Eat often and at regular, fixed times. Your caregiver or nutritionist will give you a meal plan to guide your sugar intake.  Being at an ideal weight is important. If needed, losing as little as 10 to 15 pounds may help improve blood glucose levels. SEEK MEDICAL CARE IF:   You have questions about medicine, activity, or diet.  You continue to have symptoms (problems such as increased thirst, urination, or weight gain). SEEK IMMEDIATE MEDICAL CARE IF:   You are vomiting or have diarrhea.  Your breath smells fruity.  You are breathing faster or slower.  You are very sleepy or incoherent.  You have numbness, tingling, or pain in your feet or hands.  You have chest pain.  Your symptoms get worse even though you have been following your caregiver's orders.  If you have any other questions or concerns.   This information is not intended to replace advice given to you by your health care provider. Make sure you discuss any questions you have with your health care provider.   Document Released: 08/16/2000 Document Revised: 05/15/2011 Document Reviewed: 10/27/2014 Elsevier Interactive Patient Education 2016 [employer]   Diabetes Mellitus and Food It is important for you to manage your blood sugar (glucose) level. Your blood glucose level can be greatly affected by what you eat. Eating healthier foods in the appropriate amounts throughout the day at about the same time each day will help you control your blood glucose level. It can also help slow or prevent worsening of your diabetes mellitus. Healthy eating may even help you improve the level of your blood pressure and reach or maintain a healthy weight.  General recommendations for healthful eating and cooking habits  include:  Eating meals and snacks regularly. Avoid going long periods of time without eating to lose weight.  Eating a diet that consists mainly of plant-based foods, such as fruits, vegetables, nuts, legumes, and whole grains.  Using low-heat cooking methods, such as baking, instead of high-heat cooking methods, such as deep frying. Work with your dietitian to make sure you understand how to use the Nutrition Facts information on food labels. HOW CAN FOOD AFFECT ME? Carbohydrates Carbohydrates affect your blood glucose level more than any other type of food. Your dietitian will help you determine how many carbohydrates to eat at each meal and teach you how to count carbohydrates. Counting carbohydrates is important to keep your blood glucose at a healthy level, especially if you are using insulin or taking certain medicines for diabetes mellitus. Alcohol Alcohol can cause sudden decreases in blood glucose (hypoglycemia), especially if you use insulin or take certain medicines for diabetes mellitus. Hypoglycemia can be a life-threatening condition. Symptoms of hypoglycemia (sleepiness, dizziness, and disorientation) are similar to symptoms of having too much alcohol.  If your health care provider has given you approval to drink alcohol, do so in moderation and use the following guidelines:  Women should not have more than one drink per day,  and men should not have more than two drinks per day. One drink is equal to:  12 oz of beer.  5 oz of wine.  1 oz of hard liquor.  Do not drink on an empty stomach.  Keep yourself hydrated. Have water, diet soda, or unsweetened iced tea.  Regular soda, juice, and other mixers might contain a lot of carbohydrates and should be counted. WHAT FOODS ARE NOT RECOMMENDED? As you make food choices, it is important to remember that all foods are not the same. Some foods have fewer nutrients per serving than other foods, even though they might have the same  number of calories or carbohydrates. It is difficult to get your body what it needs when you eat foods with fewer nutrients. Examples of foods that you should avoid that are high in calories and carbohydrates but low in nutrients include:  Trans fats (most processed foods list trans fats on the Nutrition Facts label).  Regular soda.  Juice.  Candy.  Sweets, such as cake, pie, doughnuts, and cookies.  Fried foods. WHAT FOODS CAN I EAT? Eat nutrient-rich foods, which will nourish your body and keep you healthy. The food you should eat also will depend on several factors, including:  The calories you need.  The medicines you take.  Your weight.  Your blood glucose level.  Your blood pressure level.  Your cholesterol level. You should eat a variety of foods, including:  Protein.  Lean cuts of meat.  Proteins low in saturated fats, such as fish, egg whites, and beans. Avoid processed meats.  Fruits and vegetables.  Fruits and vegetables that may help control blood glucose levels, such as apples, mangoes, and yams.  Dairy products.  Choose fat-free or low-fat dairy products, such as milk, yogurt, and cheese.  Grains, bread, pasta, and rice.  Choose whole grain products, such as multigrain bread, whole oats, and brown rice. These foods may help control blood pressure.  Fats.  Foods containing healthful fats, such as nuts, avocado, olive oil, canola oil, and fish. DOES EVERYONE WITH DIABETES MELLITUS HAVE THE SAME MEAL PLAN? Because every person with diabetes mellitus is different, there is not one meal plan that works for everyone. It is very important that you meet with a dietitian who will help you create a meal plan that is just right for you.   This information is not intended to replace advice given to you by your health care provider. Make sure you discuss any questions you have with your health care provider.   Document Released: 11/17/2004 Document Revised:  03/13/2014 Document Reviewed: 01/17/2013 Elsevier Interactive Patient Education [employer]

## 2015-09-19 ENCOUNTER — Telehealth: Payer: Self-pay | Admitting: Family Medicine

## 2015-09-19 LAB — CULTURE, BLOOD (ROUTINE X 2)
Culture: NO GROWTH
Culture: NO GROWTH

## 2015-09-19 NOTE — Telephone Encounter (Signed)
Patient called to report CBG of 243 this morning. Also with increased urination. Went to ED with a CBG in the 400s yesterday. Got a work up that was negative for DKA. Took 25U of lantus yesterday evening. Patient asked what she should if her blood sugar increases later today after she eats. Patient was asking for short acting insulin. I told patient that I would not be able to prescribe her short acting insuling over the phone as I have not personally seen or evaluated her and she has not been on this previously. I instructed the patient to increase her dose of lantus tonight to 28U. Instructed patient that she should follow up in the ED if her blood sugar is extremely elevated or if she starts having nausea, vomiting, severe malaise, or chills. Patient voiced understanding.   Katina Degreealeb M. Jimmey RalphParker, MD [facility] Resident PGY-3 09/19/2015 11:41 AM

## 2015-09-20 ENCOUNTER — Ambulatory Visit (INDEPENDENT_AMBULATORY_CARE_PROVIDER_SITE_OTHER): Payer: Self-pay | Admitting: Family Medicine

## 2015-09-20 ENCOUNTER — Encounter: Payer: Self-pay | Admitting: Family Medicine

## 2015-09-20 VITALS — BP 144/83 | HR 95 | Temp 98.5°F | Ht 68.0 in | Wt 311.2 lb

## 2015-09-20 DIAGNOSIS — E119 Type 2 diabetes mellitus without complications: Secondary | ICD-10-CM

## 2015-09-20 DIAGNOSIS — L03011 Cellulitis of right finger: Secondary | ICD-10-CM

## 2015-09-20 LAB — AEROBIC/ANAEROBIC CULTURE W GRAM STAIN (SURGICAL/DEEP WOUND)

## 2015-09-20 LAB — AEROBIC/ANAEROBIC CULTURE (SURGICAL/DEEP WOUND)

## 2015-09-20 NOTE — Assessment & Plan Note (Signed)
Fasting blood glucoses reviewed and well controlled giving concurrent infection Explained to patient that glucoses will improve after resolution of infection Continue 28 units of Lantus daily at bedtime and metformin  no fast acting insulin at this time Follow-up with Dr. Raymondo BandKoval in 2 weeks

## 2015-09-20 NOTE — Patient Instructions (Signed)
Nice to meet you.  Continue Lantus 28 units nightly.  Follow-up with hand surgery.  Take care, Dr. BLeonard Schwartz

## 2015-09-20 NOTE — Assessment & Plan Note (Signed)
Improving Follow-up with hand surgery as scheduled Finish course of doxycycline

## 2015-09-20 NOTE — Progress Notes (Signed)
   Subjective:   [name] [name] is a 40 y.o. female with a history of T2 DM, bipolar disorder, right third finger abscess with extensor tendon tenosynovitis here for hospital follow-up for T2 DM and right third finger abscess with extensor tendon tenosynovitis   T2DM - a1c 10.3 during hospitalization - patient self-increased lantus from 20 to 28 untis daily (qhs) since discharge - fastings 180-260 - did have a post-prandial in 500s 2 days ago and went to ED - avoiding carbohydrates as possible - wants to be on fast acting insulin - taking metformin 1000mg  daily - has stomach upset with BID dosing - needs to get an appt to see Dr Raymondo BandKoval also  R finger infection - improving - changing dressings daily - taking doxy as prescribed - has appt with hand surgery tomorrow  Review of Systems:  Per HPI.   Social History: Never smoker  Objective:  BP 144/83 mmHg  Pulse 95  Temp(Src) 98.5 F (36.9 C) (Oral)  Ht 5\' 8"  (1.727 m)  Wt 311 lb 3.2 oz (141.159 kg)  BMI 47.33 kg/m2  Gen:  40 y.o. female in NAD, Somewhat anxious HEENT: NCAT, MMM, EOMI, PERRL, anicteric sclerae CV: RRR, no MRG Resp: Non-labored, CTAB, no wheezes noted Ext: WWP, no edema MSK: Right third finger with wound over dorsal aspect, fingernail removed, appears to be healing well, no surrounding erythema, range of motion intact Neuro: Alert and oriented, speech normal     Assessment & Plan:     [name] [name] is a 40 y.o. female here for   Type 2 diabetes mellitus without complication, without long-term current use of insulin (HCC) Fasting blood glucoses reviewed and well controlled giving concurrent infection Explained to patient that glucoses will improve after resolution of infection Continue 28 units of Lantus daily at bedtime and metformin  no fast acting insulin at this time Follow-up with Dr. Raymondo BandKoval in 2 weeks   Cellulitis of finger of right hand Improving Follow-up with hand surgery as  scheduled Finish course of doxycycline    Erasmo DownerAngela M Bacigalupo, MD MPH PGY-3,  [facility] 09/20/2015  11:57 AM

## 2015-09-21 ENCOUNTER — Inpatient Hospital Stay: Payer: Self-pay | Admitting: Family Medicine

## 2015-10-08 ENCOUNTER — Encounter: Payer: Self-pay | Admitting: Pharmacist

## 2015-10-08 ENCOUNTER — Ambulatory Visit (INDEPENDENT_AMBULATORY_CARE_PROVIDER_SITE_OTHER): Payer: Self-pay | Admitting: Pharmacist

## 2015-10-08 DIAGNOSIS — E11628 Type 2 diabetes mellitus with other skin complications: Secondary | ICD-10-CM

## 2015-10-08 DIAGNOSIS — E119 Type 2 diabetes mellitus without complications: Secondary | ICD-10-CM

## 2015-10-08 LAB — GLUCOSE, CAPILLARY: GLUCOSE-CAPILLARY: 134 mg/dL — AB (ref 65–99)

## 2015-10-08 NOTE — Patient Instructions (Signed)
It was nice to see you today!   Continue taking Lantus 28 units daily Continue Metformin 1 tablet nightly Try to find your meter and check your blood sugar every morning before breakfast. Call us if you are seeing fasting sugars in the 80s and 90s Keep working on losing weight, you're doing a great job! Bring your meter into clinic next time you see [place].  Next time you come into clinic, bring metformin bottle in with you so we can confirm the dose.  Return to clinic in 4 weeks.

## 2015-10-08 NOTE — Progress Notes (Signed)
Patient ID: [name] [name], female   DOB: 06/26/1975, 40 y.o.   MRN: [id number] Reviewed: Agree with Dr. Macky Lower documentation and management.

## 2015-10-08 NOTE — Progress Notes (Signed)
    S:    Patient arrives in good spirits, walking without assistance. Presents for diabetes evaluation, education, and management at the request of Dr. Beryle Flock. Patient was referred on 09/20/2015.  Patient was last seen by Primary Care Provider on 09/20/2015. Pt reports she hadn't been taking insulin reguarly recently, patient has no idea why she stopped taking her insulin a few weeks prior. Has been regularly taking insulin x 2 weeks now. Pt reports the dose of her lantus was increased during recent ED visit for hyperglycemia to 28 units daily.  Pt reports she is still obtaining insulin from [facility].  Pt reports she d/c'd trulicity in [place] of 2016 d/t intolerable side effects of nausea, abdominal pain, constipation.  We discussed consideration for an alternative GLP and she was open to considering. Additionally, patient reports she has not tried any SGLT2 inhibitors (invokana, farxiga, jardiance).  Current diabetes medications include:  Lantus and Metformin.   Patient denies hypoglycemic events even with higher dose of Lantus recently.   Patient reported dietary habits: Reports cutting carbs, avoiding sugary foods, eating greek yogurt and protein shakes.   Patient reported exercise habits: Limited.     O:  Lab Results  Component Value Date   HGBA1C 10.3 (H) 09/14/2015   Vitals:   10/08/15 0848  BP: 122/80  Pulse: 85   Home fasting CBG: last week 120 lowest reading  Fasting CBG in office:  13  A/P: Diabetes longstanding currently with improved control of blood glucose  Patient denies hypoglycemic events and is able to verbalize appropriate hypoglycemia management plan. Patient reports adherence with medication over the last two weeks. Control is suboptimal due to periodic nonadherence likely related to changed in motivation.   Continue metformin and insulin glargine (Lantus) at 28 unites.  Next A1C anticipated at next visit.   ASCVD risk is 1%.    Written patient instructions  provided.  Total time in face to face counseling 35 minutes.   Follow up in Pharmacist Clinic Visit in 4 weeks .   Patient seen with Carole Binning, PharmD Candidate and Devota Pace, PharmD Resident.

## 2015-10-08 NOTE — Assessment & Plan Note (Signed)
Diabetes longstanding currently with improved control of blood glucose  Patient denies hypoglycemic events and is able to verbalize appropriate hypoglycemia management plan. Patient reports adherence with medication over the last two weeks. Control is suboptimal due to periodic nonadherence likely related to changed in motivation.   Continue metformin and insulin glargine (Lantus) at 28 unites.  Next A1C anticipated at next visit.

## 2015-10-26 ENCOUNTER — Ambulatory Visit: Payer: Self-pay

## 2015-11-09 ENCOUNTER — Encounter: Payer: Self-pay | Admitting: Pharmacist

## 2015-11-09 ENCOUNTER — Ambulatory Visit (INDEPENDENT_AMBULATORY_CARE_PROVIDER_SITE_OTHER): Payer: Self-pay | Admitting: Pharmacist

## 2015-11-09 DIAGNOSIS — E11628 Type 2 diabetes mellitus with other skin complications: Secondary | ICD-10-CM

## 2015-11-09 DIAGNOSIS — E7800 Pure hypercholesterolemia, unspecified: Secondary | ICD-10-CM

## 2015-11-09 LAB — LIPID PANEL
Cholesterol: 208 mg/dL — ABNORMAL HIGH (ref 125–200)
HDL: 53 mg/dL (ref >=46)
LDL CALC: 93 mg/dL (ref <130)
Total CHOL/HDL Ratio: 3.9 Ratio (ref <=5.0)
Triglycerides: 311 mg/dL — ABNORMAL HIGH (ref <150)
VLDL: 62 mg/dL — ABNORMAL HIGH (ref <30)

## 2015-11-09 LAB — COMPREHENSIVE METABOLIC PANEL
ALBUMIN: 3.7 g/dL (ref 3.6–5.1)
ALT: 11 U/L (ref 6–29)
AST: 15 U/L (ref 10–30)
Alkaline Phosphatase: 79 U/L (ref 33–115)
BILIRUBIN TOTAL: 0.2 mg/dL (ref 0.2–1.2)
BUN: 12 mg/dL (ref 7–25)
CO2: 21 mmol/L (ref 20–31)
CREATININE: 0.66 mg/dL (ref 0.50–1.10)
Calcium: 9 mg/dL (ref 8.6–10.2)
Chloride: 105 mmol/L (ref 98–110)
Glucose, Bld: 154 mg/dL — ABNORMAL HIGH (ref 65–99)
Potassium: 3.7 mmol/L (ref 3.5–5.3)
SODIUM: 143 mmol/L (ref 135–146)
TOTAL PROTEIN: 6.8 g/dL (ref 6.1–8.1)

## 2015-11-09 LAB — POCT GLYCOSYLATED HEMOGLOBIN (HGB A1C): Hemoglobin A1C: 8.5

## 2015-11-09 NOTE — Patient Instructions (Signed)
Keep up the great work.     Keep doing the same things to control your blood sugars!  Next follow up with your PCP in December.

## 2015-11-09 NOTE — Progress Notes (Signed)
    S:    Patient arrives ambulating without assistance in good spirits.  Presents for diabetes evaluation, education, and management. Patient was last seen by Primary Care Provider on 09/20/15.   Patient denies adherence with medications, opting to discontinue her Lantus 20 units daily in the presence of lowered home CBG readings (avg 144). Current diabetes medications include: metformin 1000 mg qhs  Current hypertension medications include: none  Patient denies hypoglycemic events.   O:  Lab Results  Component Value Date   HGBA1C 8.5 11/09/2015   Vitals:   11/09/15 1609  BP: 138/77  Pulse: 87   7 day average home fasting CBGs = 144 10 year ASCVD risk: 1.8% (based on 2016 lipid panel)  A/P: Diabetes longstanding currently improved with A1c today of 8.5% (previously 10.3%). Patient denies hypoglycemic events and is able to verbalize appropriate hypoglycemia management plan. Patient has self-discontinued using Lantus 20 units daily as her home CBG readings have been within goal without it.  Agreed with patient to discontinue basal insulin Lantus (insulin glargine) at this time. Continue metformin 1000 mg qhs. Patient will continue to monitor home CBGs. If blood sugar readings begin to elevate, consider re-initiating Lantus. Also discussed with patient about the option of adding other agents such as SGLT2 or GLP1 agonists (except Trulicity), but she wishes to defer until December. Counseled patient on increasing exercise and weight loss. Pt set a weight loss goal of 5 pounds by December. Next A1C anticipated 02/08/16.    ASCVD risk score of 1.6% based on 2016 lipid panel. Lipid panel repeated today. Reassess necessity of statin at next clinic visit.   Written patient instructions provided.  Total time in face to face counseling 20 minutes.   Follow up in Pharmacist Clinic Visit 02/08/16.   Patient seen with Carole Binninghristine Chong, PharmD Candidate and Ginger CarneJennifer Otter, PharmD Candidate.

## 2015-11-09 NOTE — Assessment & Plan Note (Signed)
Diabetes longstanding currently improved with A1c today of 8.5% (previously 10.3%). Patient denies hypoglycemic events and is able to verbalize appropriate hypoglycemia management plan. Patient has self-discontinued using Lantus 20 units daily as her home CBG readings have been within goal without it.  Agreed with patient to discontinue basal insulin Lantus (insulin glargine) at this time. Continue metformin 1000 mg qhs. Patient will continue to monitor home CBGs. If blood sugar readings begin to elevate, consider re-initiating Lantus. Also discussed with patient about the option of adding other agents such as SGLT2 or GLP1 agonists (except Trulicity), but she wishes to defer until December. Counseled patient on increasing exercise and weight loss. Pt set a weight loss goal of 5 pounds by December. Next A1C anticipated 02/08/16.

## 2015-11-09 NOTE — Assessment & Plan Note (Signed)
ASCVD risk score of 1.6% based on 2016 lipid panel. Lipid panel repeated today. Reassess necessity of statin at next clinic visit

## 2015-11-10 NOTE — Progress Notes (Signed)
Patient ID: [name] [name], female   DOB: 09/17/1975, 39 y.o.   MRN: [id number] Reviewed: Agree with Dr. Koval's documentation and management. 

## 2015-11-12 ENCOUNTER — Other Ambulatory Visit: Payer: Self-pay | Admitting: *Deleted

## 2015-11-12 NOTE — Telephone Encounter (Signed)
[name] [name] from [facility] called for refill on Lamictal.  Patient stated she has been out of medication for a couple of weeks now.  Last time Lamictal was filled at [facility] was 05/12/15, picked up by patient on 05/24/15 and filled again 06/25/15 and picked up by patient on 07/30/15.  Patient is out of refills.  Should patient continue current dosage or will she need to start at different dosage.  Please give [facility] a call at [phone].  Clovis PuMartin, [name] Jezek L, RN

## 2015-11-17 MED ORDER — LAMOTRIGINE 25 MG PO TABS: ORAL_TABLET | ORAL | 0 refills | Status: DC

## 2015-11-17 NOTE — Telephone Encounter (Signed)
I have attempted to touch base with the [facility] program several times over the last few days. I left a message for them with the following recommendations.   As the patient has been off lamictal for some time, we need titrate her back up.  She needs to take 25mg  daily for 2 weeks, then 50mg  daily for 2 weeks, then 100mg  for 1 week, then 200mg  daily after that.  Rx placed to fax.  Please have the patient follow up with me in the next month.  Thanks, Joanna Puffrystal S. Dorsey, MD [facility] Resident  11/17/2015, 9:50 AM

## 2015-11-23 ENCOUNTER — Encounter (HOSPITAL_COMMUNITY): Payer: Self-pay

## 2015-11-23 ENCOUNTER — Inpatient Hospital Stay (HOSPITAL_COMMUNITY)
Admission: EM | Admit: 2015-11-23 | Discharge: 2015-11-26 | DRG: 540 | Disposition: A | Discharge status: Home / Self Care | Payer: Medicaid Other | Attending: Internal Medicine | Admitting: Internal Medicine

## 2015-11-23 ENCOUNTER — Emergency Department (HOSPITAL_COMMUNITY): Payer: Medicaid Other

## 2015-11-23 DIAGNOSIS — E1165 Type 2 diabetes mellitus with hyperglycemia: Secondary | ICD-10-CM | POA: Diagnosis present

## 2015-11-23 DIAGNOSIS — Z91048 Other nonmedicinal substance allergy status: Secondary | ICD-10-CM | POA: Diagnosis not present

## 2015-11-23 DIAGNOSIS — M79609 Pain in unspecified limb: Secondary | ICD-10-CM | POA: Diagnosis not present

## 2015-11-23 DIAGNOSIS — L03011 Cellulitis of right finger: Secondary | ICD-10-CM | POA: Diagnosis present

## 2015-11-23 DIAGNOSIS — G8929 Other chronic pain: Secondary | ICD-10-CM | POA: Diagnosis not present

## 2015-11-23 DIAGNOSIS — Z794 Long term (current) use of insulin: Secondary | ICD-10-CM

## 2015-11-23 DIAGNOSIS — E7800 Pure hypercholesterolemia, unspecified: Secondary | ICD-10-CM | POA: Diagnosis present

## 2015-11-23 DIAGNOSIS — Z6841 Body Mass Index (BMI) 40.0 and over, adult: Secondary | ICD-10-CM

## 2015-11-23 DIAGNOSIS — I10 Essential (primary) hypertension: Secondary | ICD-10-CM | POA: Diagnosis present

## 2015-11-23 DIAGNOSIS — Z79899 Other long term (current) drug therapy: Secondary | ICD-10-CM

## 2015-11-23 DIAGNOSIS — D649 Anemia, unspecified: Secondary | ICD-10-CM | POA: Diagnosis present

## 2015-11-23 DIAGNOSIS — Z888 Allergy status to other drugs, medicaments and biological substances status: Secondary | ICD-10-CM

## 2015-11-23 DIAGNOSIS — E1169 Type 2 diabetes mellitus with other specified complication: Secondary | ICD-10-CM | POA: Diagnosis present

## 2015-11-23 DIAGNOSIS — M86141 Other acute osteomyelitis, right hand: Secondary | ICD-10-CM | POA: Diagnosis present

## 2015-11-23 DIAGNOSIS — L03113 Cellulitis of right upper limb: Secondary | ICD-10-CM | POA: Diagnosis present

## 2015-11-23 DIAGNOSIS — Z90710 Acquired absence of both cervix and uterus: Secondary | ICD-10-CM | POA: Diagnosis not present

## 2015-11-23 DIAGNOSIS — M79601 Pain in right arm: Secondary | ICD-10-CM

## 2015-11-23 DIAGNOSIS — F319 Bipolar disorder, unspecified: Secondary | ICD-10-CM | POA: Diagnosis present

## 2015-11-23 DIAGNOSIS — Z885 Allergy status to narcotic agent status: Secondary | ICD-10-CM

## 2015-11-23 DIAGNOSIS — G4733 Obstructive sleep apnea (adult) (pediatric): Secondary | ICD-10-CM | POA: Diagnosis present

## 2015-11-23 DIAGNOSIS — Z8614 Personal history of Methicillin resistant Staphylococcus aureus infection: Secondary | ICD-10-CM

## 2015-11-23 DIAGNOSIS — D509 Iron deficiency anemia, unspecified: Secondary | ICD-10-CM | POA: Diagnosis present

## 2015-11-23 DIAGNOSIS — E785 Hyperlipidemia, unspecified: Secondary | ICD-10-CM | POA: Diagnosis present

## 2015-11-23 HISTORY — DX: Sleep apnea, unspecified: G47.30

## 2015-11-23 HISTORY — DX: Family history of other specified conditions: Z84.89

## 2015-11-23 LAB — CBC WITH DIFFERENTIAL/PLATELET
BASOS ABS: 0 10*3/uL (ref 0.0–0.1)
Basophils Relative: 0 %
Eosinophils Absolute: 0.3 10*3/uL (ref 0.0–0.7)
Eosinophils Relative: 4 %
HEMATOCRIT: 35.8 % — AB (ref 36.0–46.0)
Hemoglobin: 11.7 g/dL — ABNORMAL LOW (ref 12.0–15.0)
LYMPHS ABS: 2.6 10*3/uL (ref 0.7–4.0)
LYMPHS PCT: 40 %
MCH: 28.1 pg (ref 26.0–34.0)
MCHC: 32.7 g/dL (ref 30.0–36.0)
MCV: 86.1 fL (ref 78.0–100.0)
Monocytes Absolute: 0.4 10*3/uL (ref 0.1–1.0)
Monocytes Relative: 6 %
NEUTROS ABS: 3.4 10*3/uL (ref 1.7–7.7)
Neutrophils Relative %: 50 %
Platelets: 213 10*3/uL (ref 150–400)
RBC: 4.16 MIL/uL (ref 3.87–5.11)
RDW: 13.5 % (ref 11.5–15.5)
WBC: 6.7 10*3/uL (ref 4.0–10.5)

## 2015-11-23 LAB — I-STAT CG4 LACTIC ACID, ED
Lactic Acid, Venous: 2.52 mmol/L (ref 0.5–1.9)
Lactic Acid, Venous: 1.61 mmol/L (ref 0.5–1.9)

## 2015-11-23 LAB — COMPREHENSIVE METABOLIC PANEL
ALBUMIN: 3.5 g/dL (ref 3.5–5.0)
ALT: 14 U/L (ref 14–54)
AST: 22 U/L (ref 15–41)
Alkaline Phosphatase: 77 U/L (ref 38–126)
Anion gap: 8 (ref 5–15)
BILIRUBIN TOTAL: 0.3 mg/dL (ref 0.3–1.2)
BUN: 8 mg/dL (ref 6–20)
CO2: 23 mmol/L (ref 22–32)
CREATININE: 0.72 mg/dL (ref 0.44–1.00)
Calcium: 9.5 mg/dL (ref 8.9–10.3)
Chloride: 108 mmol/L (ref 101–111)
GFR calc Af Amer: >60 mL/min (ref >60)
GLUCOSE: 258 mg/dL — AB (ref 65–99)
POTASSIUM: 3.4 mmol/L — AB (ref 3.5–5.1)
Sodium: 139 mmol/L (ref 135–145)
TOTAL PROTEIN: 6.9 g/dL (ref 6.5–8.1)

## 2015-11-23 LAB — I-STAT BETA HCG BLOOD, ED (MC, WL, AP ONLY): I-stat hCG, quantitative: <5 m[IU]/mL (ref <5)

## 2015-11-23 LAB — URINALYSIS, ROUTINE W REFLEX MICROSCOPIC
Bilirubin Urine: NEGATIVE
GLUCOSE, UA: 100 mg/dL — AB
Hgb urine dipstick: NEGATIVE
Ketones, ur: NEGATIVE mg/dL
LEUKOCYTES UA: NEGATIVE
Nitrite: NEGATIVE
PH: 6 (ref 5.0–8.0)
Protein, ur: NEGATIVE mg/dL
SPECIFIC GRAVITY, URINE: 1.03 (ref 1.005–1.030)

## 2015-11-23 MED ORDER — SODIUM CHLORIDE 0.9 % IV BOLUS (SEPSIS)
1000.0000 mL | Freq: Once | INTRAVENOUS | Status: AC
  Administered 2015-11-23: 1000 mL via INTRAVENOUS

## 2015-11-23 MED ORDER — SODIUM CHLORIDE 0.9 % IV BOLUS (SEPSIS)
30.0000 mL/kg | Freq: Once | INTRAVENOUS | Status: AC
  Administered 2015-11-23: 4287 mL via INTRAVENOUS
  Filled 2015-11-23: qty 4350

## 2015-11-23 MED ORDER — ACETAMINOPHEN 500 MG PO TABS
1000.0000 mg | ORAL_TABLET | Freq: Four times a day (QID) | ORAL | Status: DC | PRN
  Administered 2015-11-24: 1000 mg via ORAL
  Filled 2015-11-23: qty 2

## 2015-11-23 MED ORDER — VANCOMYCIN HCL IN DEXTROSE 1-5 GM/200ML-% IV SOLN
1000.0000 mg | Freq: Once | INTRAVENOUS | Status: AC
  Administered 2015-11-24: 1000 mg via INTRAVENOUS
  Filled 2015-11-23: qty 200

## 2015-11-23 MED ORDER — KETOROLAC TROMETHAMINE 30 MG/ML IJ SOLN
30.0000 mg | Freq: Once | INTRAMUSCULAR | Status: AC
  Administered 2015-11-24: 30 mg via INTRAVENOUS
  Filled 2015-11-23: qty 1

## 2015-11-23 MED ORDER — DOXYCYCLINE HYCLATE 100 MG IV SOLR
100.0000 mg | Freq: Once | INTRAVENOUS | Status: AC
  Administered 2015-11-23: 100 mg via INTRAVENOUS
  Filled 2015-11-23: qty 100

## 2015-11-23 NOTE — H&P (Addendum)
History and Physical    [name]:[id number] DOB: 17-Sep-1975 DOA: 11/23/2015  PCP: Rodrigo Ran, MD   Patient coming from: Home.  Chief Complaint: Finger infection.  HPI: [name] [name] is a 40 y.o. female with medical history significant of allergy, anemia, bipolar disorder, type 2 diabetes, morbid obesity who comes emergency department due to right middle finger infection.  The patient was admitted in July due to right middle finger cellulitis. Her right middle fingernail was removed. She states that last week she had a small wound in the area and call her PCP who called in an antibiotic for her. However, her symptoms had not resolved and she started developing edema, erythema and worsening of pain this afternoon. She is states that her hand pain radiates all the way up her arm.  ED Course: The patient receive IV antibiotics and analgesics reporting partial relief. WBC was 6.7, hemoglobin was 11.7 g/dL, potassium was 3.4 mmol/liter. Imaging shows soft tissue swelling of the right middle finger.  Review of Systems: As per HPI otherwise 10 point review of systems negative.    Past Medical History:  Diagnosis Date  . Allergy   . Anemia   . Bipolar 1 disorder [facility])    Sees Dr. Ledon Snare, psychology,  [phone]  . Diabetes mellitus without complication (HCC)   . Fatigue     Past Surgical History:  Procedure Laterality Date  . ABDOMINAL HYSTERECTOMY    . I&D EXTREMITY Right 09/14/2015   Procedure: IRRIGATION AND DEBRIDEMENT RIGHT MIDDLE FINGER;  Surgeon: Dominica Severin, MD;  Location: [facility] OR;  Service: Orthopedics;  Laterality: Right;     reports that she has never smoked. She has never used smokeless tobacco. She reports that she drinks alcohol. She reports that she does not use drugs.  Allergies  Allergen Reactions  . Hydromorphone Hcl Anaphylaxis and Hives  . Meperidine Hcl Anaphylaxis and Hives    REACTION: ?anaphylaxis  . Morphine Anaphylaxis and Hives   Anything in this family REACTION: ?anaphyaxis  . Lisinopril Cough  . Sumatriptan Other (See Comments)    Makes headaches worse.   . Tape Rash    EKG LEADS BLISTER THE PATIENT'S SKIN AND LEAVE RED, RAISED AREAS!!  . Zolmitriptan Other (See Comments)    Headache    Family History  Problem Relation Age of Onset  . Hypertension Mother   . Diabetes Mellitus II Mother   . Congestive Heart Failure Father   . Hypertension Father   . Diabetes Mellitus II Father     Prior to Admission medications   Medication Sig Start Date End Date Taking? Authorizing Provider  cetirizine (ZYRTEC) 10 MG tablet Take 10 mg by mouth daily.    Yes Historical Provider, MD  Insulin Glargine (LANTUS SOLOSTAR) 100 UNIT/ML Solostar Pen Inject 20 Units into the skin daily at 10 pm. Patient taking differently: Inject 20 Units into the skin at bedtime as needed.  07/03/14  Yes Joanna Puff, MD  metFORMIN (GLUCOPHAGE-XR) 500 MG 24 hr tablet Take 3 tablets (1,500 mg total) by mouth at bedtime. Patient taking differently: Take 1,000 mg by mouth at bedtime.  03/24/14  Yes Joanna Puff, MD  naproxen sodium (ALEVE) 220 MG tablet Take 220-440 mg by mouth 2 (two) times daily as needed (for pain or headaches).   Yes Historical Provider, MD  venlafaxine XR (EFFEXOR-XR) 150 MG 24 hr capsule Take 1 capsule (150 mg total) by mouth daily with breakfast. Patient taking differently: Take 150 mg by  mouth at bedtime.  06/28/15  Yes Joanna Puff, MD  lamoTRIgine (LAMICTAL) 25 MG tablet Take 25mg  for 2 weeks, then 50mg  for 2 weeks, then 100mg  for 1 week, then resume 200mg  daily 11/17/15   Joanna Puff, MD    Physical Exam: Vitals:   11/23/15 1942 11/23/15 1947  BP: 141/100   Pulse: 96   Resp: 18   Temp: 99.4 F (37.4 C)   TempSrc: Oral   SpO2: 100%   Weight:  (!) 142.9 kg (315 lb)  Height:  5\' 8"  (1.727 m)      Constitutional: NAD, calm, comfortable Vitals:   11/23/15 1942 11/23/15 1947  BP: 141/100   Pulse:  96   Resp: 18   Temp: 99.4 F (37.4 C)   TempSrc: Oral   SpO2: 100%   Weight:  (!) 142.9 kg (315 lb)  Height:  5\' 8"  (1.727 m)   Eyes: PERRL, lids and conjunctivae normal ENMT: Mucous membranes are moist. Posterior pharynx clear of any exudate or lesions.Normal dentition.  Neck: normal, supple, no masses, no thyromegaly Respiratory: clear to auscultation bilaterally, no wheezing, no crackles. Normal respiratory effort. No accessory muscle use.  Cardiovascular: Regular rate and rhythm, no murmurs / rubs / gallops. No extremity edema. 2+ pedal pulses. No carotid bruits.  Abdomen: Bowel sounds positive. Obese, soft, no tenderness, no masses palpated. No hepatosplenomegaly.  Musculoskeletal: Positive tenderness, erythema, edema and calor right middle finger. Skin: Mostly absent right middle fingernail. Finger findings as above. Neurologic: CN 2-12 grossly intact. Sensation intact, DTR normal. Strength 5/5 in all 4.  Psychiatric: Normal judgment and insight. Alert and oriented x 4. Normal mood.     Labs on Admission: I have personally reviewed following labs and imaging studies  CBC:  Recent Labs Lab 11/23/15 2000  WBC 6.7  NEUTROABS 3.4  HGB 11.7*  HCT 35.8*  MCV 86.1  PLT 213   Basic Metabolic Panel:  Recent Labs Lab 11/23/15 2000  NA 139  K 3.4*  CL 108  CO2 23  GLUCOSE 258*  BUN 8  CREATININE 0.72  CALCIUM 9.5   GFR: Estimated Creatinine Clearance: 139.5 mL/min (by C-G formula based on SCr of 0.72 mg/dL). Liver Function Tests:  Recent Labs Lab 11/23/15 2000  AST 22  ALT 14  ALKPHOS 77  BILITOT 0.3  PROT 6.9  ALBUMIN 3.5   No results for input(s): LIPASE, AMYLASE in the last 168 hours. No results for input(s): AMMONIA in the last 168 hours. Coagulation Profile: No results for input(s): INR, PROTIME in the last 168 hours. Cardiac Enzymes: No results for input(s): CKTOTAL, CKMB, CKMBINDEX, TROPONINI in the last 168 hours. BNP (last 3 results) No  results for input(s): PROBNP in the last 8760 hours. HbA1C: No results for input(s): HGBA1C in the last 72 hours. CBG: No results for input(s): GLUCAP in the last 168 hours. Lipid Profile: No results for input(s): CHOL, HDL, LDLCALC, TRIG, CHOLHDL, LDLDIRECT in the last 72 hours. Thyroid Function Tests: No results for input(s): TSH, T4TOTAL, FREET4, T3FREE, THYROIDAB in the last 72 hours. Anemia Panel: No results for input(s): VITAMINB12, FOLATE, FERRITIN, TIBC, IRON, RETICCTPCT in the last 72 hours. Urine analysis:    Component Value Date/Time   COLORURINE YELLOW 11/23/2015 2048   APPEARANCEUR CLOUDY (A) 11/23/2015 2048   LABSPEC 1.030 11/23/2015 2048   PHURINE 6.0 11/23/2015 2048   GLUCOSEU 100 (A) 11/23/2015 2048   HGBUR NEGATIVE 11/23/2015 2048   HGBUR negative 09/13/2007 1353   BILIRUBINUR  NEGATIVE 11/23/2015 2048   BILIRUBINUR SMALL 10/29/2012 1519   KETONESUR NEGATIVE 11/23/2015 2048   PROTEINUR NEGATIVE 11/23/2015 2048   UROBILINOGEN 0.2 10/01/2014 1900   NITRITE NEGATIVE 11/23/2015 2048   LEUKOCYTESUR NEGATIVE 11/23/2015 2048    Radiological Exams on Admission: Dg Finger Middle Right  Result Date: 11/23/2015 CLINICAL DATA:  Redness, swelling, cellulitis EXAM: RIGHT MIDDLE FINGER 2+V COMPARISON:  None. FINDINGS: Mild soft tissue swelling. No radiopaque foreign body. Normal alignment. No acute osseous finding or focal osseous abnormality. Negative for fracture. IMPRESSION: Soft tissue swelling without acute osseous finding. Electronically Signed   By: Judie PetitM.  Shick M.D.   On: 11/23/2015 21:38    Assessment/Plan Principal Problem:   Cellulitis of right hand   Cellulitis of finger of right hand Admit to MedSurg/inpatient Continue IV antibiotics. Continue analgesics as needed. Consider consult to hand surgery if no improvement.  Active Problems:   Hypercholesterolemia Currently not taking medication. Heart healthy diet while in the hospital. Follow-up with primary  care provider.    ANEMIA, IRON DEFICIENCY, UNSPEC. Monitor hematocrit and hemoglobin.    Bipolar I disorder (HCC) The patient will be starting Lamictal and Effexor this week, since he was just prescribed. She declined to start them while in the hospital.    HYPERTENSION, BENIGN SYSTEMIC Not on antihypertensives at this time. Monitor blood pressure periodically    Diabetes mellitus with hyperglycemia (HCC) Hemoglobin A1c was 8.5% 2 weeks ago. Currently using Lantus. Continue metformin 1000 mg by mouth at bedtime.     Obstructive sleep apnea CPAP at bedtime.   DVT prophylaxis: Lovenox SQ. Code Status: Full code. Family Communication:  Disposition Plan: Admit for IV antibiotics and analgesics for several days Consults called:  Admission status: Inpatient/MedSurg.   Bobette Moavid Manuel Raheen Capili MD [facility] Pager [phone].   If 7PM-7AM, please contact night-coverage [email or web address] Password [email or web address]  11/23/2015, 11:12 PM

## 2015-11-23 NOTE — ED Provider Notes (Signed)
[facility]-EMERGENCY DEPT Provider Note   CSN: [id number] Arrival date & time: 11/23/15  1933     History   Chief Complaint Chief Complaint  Patient presents with  . Hand Pain    HPI [name] [name] is a 40 y.o. female.  Patient with a history of DM, recurrent finger infection presents with progressive pain and swelling of right middle finger similar to previous infection. She states she was admitted 2 months ago for MRSA infection and underwent surgery by Dr. Amanda Pea. She had a recurrent infection 3 weeks post surgery that improved with outpatient treatment with Doxycycline, which ended 2 weeks ago. Over the last 2 days she has developed recurrent pain and swelling with finger tip discoloration like recent recurrence of infection. No drainage. No fever. Pain extends into the forearm.    The history is provided by the patient. No language interpreter was used.  Hand Pain     Past Medical History:  Diagnosis Date  . Allergy   . Anemia   . Bipolar 1 disorder [facility])    Sees Dr. Ledon Snare, psychology,  [phone]  . Diabetes mellitus without complication (HCC)   . Fatigue     Patient Active Problem List   Diagnosis Date Noted  . Pain   . Cellulitis 09/14/2015  . Abscess of finger 09/14/2015  . Cellulitis of finger of right hand 09/14/2015  . Type 2 diabetes mellitus without complication, without long-term current use of insulin (HCC)   . Abdominal pain, left lower quadrant 07/13/2014  . Groin pain, chronic, right 07/13/2014  . Lung nodule seen on imaging study 09/24/2013  . Chest pain 09/24/2013  . Diabetes mellitus (HCC) 07/11/2012  . Pollen allergies 07/11/2012  . 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 Meralgia paresthetica of left side 08/18/2010  . Fatigue 04/20/2010  . INTERSTITIAL CYSTITIS 07/22/2007  . MENOPAUSE, SURGICAL 04/24/2007  . HIATAL HERNIA 11/20/2006  . Hypercholesterolemia 05/03/2006  . OBESITY, NOS 05/03/2006  . ANEMIA, IRON DEFICIENCY, UNSPEC. 05/03/2006    . Bipolar I disorder (HCC) 05/03/2006  . ANXIETY 05/03/2006  . MIGRAINE, UNSPEC., W/O INTRACTABLE MIGRAINE 05/03/2006  . HYPERTENSION, BENIGN SYSTEMIC 05/03/2006  . PEPTIC ULCER DIS., UNSPEC. W/O OBSTRUCTION 05/03/2006    Past Surgical History:  Procedure Laterality Date  . ABDOMINAL HYSTERECTOMY    . I&D EXTREMITY Right 09/14/2015   Procedure: IRRIGATION AND DEBRIDEMENT RIGHT MIDDLE FINGER;  Surgeon: Dominica Severin, MD;  Location: [facility] OR;  Service: Orthopedics;  Laterality: Right;    OB History    No data available       Home Medications    Prior to Admission medications   Medication Sig Start Date End Date Taking? Authorizing Provider  cetirizine (ZYRTEC) 10 MG tablet Take 10 mg by mouth daily.    Yes Historical Provider, MD  Insulin Glargine (LANTUS SOLOSTAR) 100 UNIT/ML Solostar Pen Inject 20 Units into the skin daily at 10 pm. Patient taking differently: Inject 20 Units into the skin at bedtime as needed.  07/03/14  Yes Joanna Puff, MD  metFORMIN (GLUCOPHAGE-XR) 500 MG 24 hr tablet Take 3 tablets (1,500 mg total) by mouth at bedtime. Patient taking differently: Take 1,000 mg by mouth at bedtime.  03/24/14  Yes Joanna Puff, MD  naproxen sodium (ALEVE) 220 MG tablet Take 220-440 mg by mouth 2 (two) times daily as needed (for pain or headaches).   Yes Historical Provider, MD  venlafaxine XR (EFFEXOR-XR) 150 MG 24 hr capsule Take 1 capsule (150 mg total) by mouth daily  with breakfast. Patient taking differently: Take 150 mg by mouth at bedtime.  06/28/15  Yes Joanna Puff, MD  lamoTRIgine (LAMICTAL) 25 MG tablet Take 25mg  for 2 weeks, then 50mg  for 2 weeks, then 100mg  for 1 week, then resume 200mg  daily 11/17/15   Joanna Puff, MD    Family History No family history on file.  Social History Social History  Substance Use Topics  . Smoking status: Never Smoker  . Smokeless tobacco: Never Used  . Alcohol use 0.0 oz/week     Comment: occ     Allergies    Hydromorphone hcl; Meperidine hcl; Morphine; Lisinopril; Sumatriptan; Tape; and Zolmitriptan   Review of Systems Review of Systems  Constitutional: Negative for fever.  Gastrointestinal: Negative for vomiting.  Musculoskeletal:       See HPI.  Skin: Positive for color change.  Neurological: Negative for weakness.     Physical Exam Updated Vital Signs BP 141/100 (BP Location: Left Wrist) Comment (BP Location): Pt requested BP to be done on left wrist  Pulse 96   Temp 99.4 F (37.4 C) (Oral)   Resp 18   Ht 5\' 8"  (1.727 m)   Wt (!) 142.9 kg   SpO2 100%   BMI 47.90 kg/m   Physical Exam  Constitutional: She is oriented to person, place, and time. She appears well-developed and well-nourished.  Neck: Normal range of motion.  Cardiovascular: Normal rate.   Pulmonary/Chest: Effort normal. No respiratory distress.  Neurological: She is alert and oriented to person, place, and time.  Skin: Skin is warm and dry.  Right middle fingernail surgically missing. There is moderate swelling of the finger circumferentially. Minimal redness. At the finger tip is hypopigmentation. No fluctuance. FROM. No joint tenderness. No tenderness favoring flexor surface.      ED Treatments / Results  Labs (all labs ordered are listed, but only abnormal results are displayed) Labs Reviewed  COMPREHENSIVE METABOLIC PANEL - Abnormal; Notable for the following:       Result Value   Potassium 3.4 (*)    Glucose, Bld 258 (*)    All other components within normal limits  CBC WITH DIFFERENTIAL/PLATELET - Abnormal; Notable for the following:    Hemoglobin 11.7 (*)    HCT 35.8 (*)    All other components within normal limits  URINALYSIS, ROUTINE W REFLEX MICROSCOPIC (NOT AT [facility]) - Abnormal; Notable for the following:    APPearance CLOUDY (*)    Glucose, UA 100 (*)    All other components within normal limits  I-STAT CG4 LACTIC ACID, ED - Abnormal; Notable for the following:    Lactic Acid, Venous  2.52 (*)    All other components within normal limits  CULTURE, BLOOD (ROUTINE X 2)  CULTURE, BLOOD (ROUTINE X 2)  URINE CULTURE  I-STAT BETA HCG BLOOD, ED ([facility], [facility], [facility] ONLY)  I-STAT CG4 LACTIC ACID, ED    EKG  EKG Interpretation None       Radiology Dg Finger Middle Right  Result Date: 11/23/2015 CLINICAL DATA:  Redness, swelling, cellulitis EXAM: RIGHT MIDDLE FINGER 2+V COMPARISON:  None. FINDINGS: Mild soft tissue swelling. No radiopaque foreign body. Normal alignment. No acute osseous finding or focal osseous abnormality. Negative for fracture. IMPRESSION: Soft tissue swelling without acute osseous finding. Electronically Signed   By: Judie Petit.  Shick M.D.   On: 11/23/2015 21:38    Procedures Procedures (including critical care time)  Medications Ordered in ED Medications  doxycycline (VIBRAMYCIN) 100 mg in dextrose  5 % 250 mL IVPB (not administered)  sodium chloride 0.9 % bolus 4,287 mL (4,287 mLs Intravenous New Bag/Given 11/23/15 2220)     Initial Impression / Assessment and Plan / ED Course  I have reviewed the triage vital signs and the nursing notes.  Pertinent labs & imaging results that were available during my care of the patient were reviewed by me and considered in my medical decision making (see chart for details).  Clinical Course    Patient with a history of DM with recurrent finger infection. No leukocytosis, fever. Mildly elevated lactic acid. IV fluids ordered. IV Doxycycline and vancomycin ordered.   Discussed with Dr. Robb Matarrtiz with [facility] who accepts the patient for admission. IV fluid and antibiotic course as advised by him.   Final Clinical Impressions(s) / ED Diagnoses   Final diagnoses:  None   1. Finger cellulitis  New Prescriptions New Prescriptions   No medications on file     Danne HarborShari Idamay Hosein, PA-C 11/26/15 2035    Mancel BaleElliott Wentz, MD 11/27/15 [phone]

## 2015-11-23 NOTE — ED Triage Notes (Signed)
Pt states back in July her R third finger the nail was removed and she were admitted for one week, last week the skin broke and her doctor called in ABT, and swelling started this afternoon. Hand pain is shooting up arm. Small amount of redness noted.

## 2015-11-24 ENCOUNTER — Inpatient Hospital Stay (HOSPITAL_COMMUNITY): Payer: Medicaid Other

## 2015-11-24 ENCOUNTER — Inpatient Hospital Stay (HOSPITAL_COMMUNITY)
Admit: 2015-11-24 | Discharge: 2015-11-24 | Disposition: A | Discharge status: Home / Self Care | Payer: Medicaid Other | Attending: Internal Medicine | Admitting: Internal Medicine

## 2015-11-24 DIAGNOSIS — L03011 Cellulitis of right finger: Secondary | ICD-10-CM

## 2015-11-24 DIAGNOSIS — M79609 Pain in unspecified limb: Secondary | ICD-10-CM

## 2015-11-24 DIAGNOSIS — F319 Bipolar disorder, unspecified: Secondary | ICD-10-CM

## 2015-11-24 DIAGNOSIS — E7800 Pure hypercholesterolemia, unspecified: Secondary | ICD-10-CM

## 2015-11-24 DIAGNOSIS — L03113 Cellulitis of right upper limb: Secondary | ICD-10-CM

## 2015-11-24 DIAGNOSIS — I10 Essential (primary) hypertension: Secondary | ICD-10-CM

## 2015-11-24 LAB — CBG MONITORING, ED: Glucose-Capillary: 244 mg/dL — ABNORMAL HIGH (ref 65–99)

## 2015-11-24 LAB — GLUCOSE, CAPILLARY
Glucose-Capillary: 215 mg/dL — ABNORMAL HIGH (ref 65–99)
Glucose-Capillary: 305 mg/dL — ABNORMAL HIGH (ref 65–99)

## 2015-11-24 LAB — URIC ACID: Uric Acid, Serum: 3.8 mg/dL (ref 2.3–6.6)

## 2015-11-24 LAB — MRSA PCR SCREENING: MRSA BY PCR: NEGATIVE

## 2015-11-24 MED ORDER — VANCOMYCIN HCL IN DEXTROSE 1-5 GM/200ML-% IV SOLN: 1000.0000 mg | Freq: Once | INTRAVENOUS | Status: DC

## 2015-11-24 MED ORDER — CYCLOBENZAPRINE HCL 10 MG PO TABS
10.0000 mg | ORAL_TABLET | Freq: Every day | ORAL | Status: DC
  Administered 2015-11-24 (×2): 10 mg via ORAL
  Filled 2015-11-24 (×2): qty 1

## 2015-11-24 MED ORDER — KETOROLAC TROMETHAMINE 30 MG/ML IJ SOLN
30.0000 mg | Freq: Four times a day (QID) | INTRAMUSCULAR | Status: DC | PRN
  Administered 2015-11-24: 30 mg via INTRAVENOUS
  Filled 2015-11-24 (×2): qty 1

## 2015-11-24 MED ORDER — ONDANSETRON HCL 4 MG PO TABS: 4.0000 mg | ORAL_TABLET | Freq: Four times a day (QID) | ORAL | Status: DC | PRN

## 2015-11-24 MED ORDER — ENOXAPARIN SODIUM 40 MG/0.4ML ~~LOC~~ SOLN
40.0000 mg | SUBCUTANEOUS | Status: DC
  Administered 2015-11-24: 40 mg via SUBCUTANEOUS
  Filled 2015-11-24 (×3): qty 0.4

## 2015-11-24 MED ORDER — VANCOMYCIN HCL 10 G IV SOLR
1250.0000 mg | Freq: Three times a day (TID) | INTRAVENOUS | Status: DC
  Administered 2015-11-24 – 2015-11-25 (×4): 1250 mg via INTRAVENOUS
  Filled 2015-11-24 (×6): qty 1250

## 2015-11-24 MED ORDER — METFORMIN HCL ER 500 MG PO TB24: 1000.0000 mg | ORAL_TABLET | Freq: Every day | ORAL | Status: DC

## 2015-11-24 MED ORDER — INSULIN GLARGINE 100 UNIT/ML ~~LOC~~ SOLN
15.0000 [IU] | Freq: Every day | SUBCUTANEOUS | Status: DC
  Administered 2015-11-24: 15 [IU] via SUBCUTANEOUS
  Filled 2015-11-24 (×2): qty 0.15

## 2015-11-24 MED ORDER — POTASSIUM CHLORIDE IN NACL 20-0.9 MEQ/L-% IV SOLN
INTRAVENOUS | Status: AC
  Administered 2015-11-24 (×2): via INTRAVENOUS
  Filled 2015-11-24 (×2): qty 1000

## 2015-11-24 MED ORDER — INSULIN ASPART 100 UNIT/ML ~~LOC~~ SOLN
0.0000 [IU] | Freq: Three times a day (TID) | SUBCUTANEOUS | Status: DC
  Administered 2015-11-24 (×2): 3 [IU] via SUBCUTANEOUS
  Administered 2015-11-25: 2 [IU] via SUBCUTANEOUS
  Administered 2015-11-25: 3 [IU] via SUBCUTANEOUS
  Administered 2015-11-25: 2 [IU] via SUBCUTANEOUS
  Administered 2015-11-26 (×2): 3 [IU] via SUBCUTANEOUS
  Filled 2015-11-24: qty 1

## 2015-11-24 MED ORDER — ONDANSETRON HCL 4 MG/2ML IJ SOLN: 4.0000 mg | Freq: Four times a day (QID) | INTRAMUSCULAR | Status: DC | PRN

## 2015-11-24 MED ORDER — PIPERACILLIN-TAZOBACTAM 3.375 G IVPB 30 MIN
3.3750 g | Freq: Once | INTRAVENOUS | Status: AC
  Administered 2015-11-24: 3.375 g via INTRAVENOUS
  Filled 2015-11-24: qty 50

## 2015-11-24 MED ORDER — PIPERACILLIN-TAZOBACTAM 3.375 G IVPB
3.3750 g | Freq: Three times a day (TID) | INTRAVENOUS | Status: DC
  Administered 2015-11-24 – 2015-11-25 (×3): 3.375 g via INTRAVENOUS
  Filled 2015-11-24 (×4): qty 50

## 2015-11-24 MED ORDER — LORATADINE 10 MG PO TABS
10.0000 mg | ORAL_TABLET | Freq: Every day | ORAL | Status: DC
  Administered 2015-11-24: 10 mg via ORAL
  Filled 2015-11-24 (×2): qty 1

## 2015-11-24 NOTE — Progress Notes (Signed)
Pharmacy Antibiotic Note  [name] [name] is a 40 y.o. female admitted on 11/23/2015 with R middle finger cellulitis. SCr 0.7, LA 2.5 > 1.6. Pt received sub-optimal vancomycin load in ED early this morning. Starting abx for cellulitis.    Plan: -Zosyn 3.375 g IV q8h -Vancomycin 1250 mg IV q8h -Monitor renal fx, cultures, VT at Css   Height: 5\' 8"  (172.7 cm) Weight: (!) 315 lb (142.9 kg) IBW/kg (Calculated) : 63.9  Temp (24hrs), Avg:98.8 F (37.1 C), Min:98.2 F (36.8 C), Max:99.4 F (37.4 C)   Recent Labs Lab 11/23/15 2000 11/23/15 2008 11/23/15 2253  WBC 6.7  --   --   CREATININE 0.72  --   --   LATICACIDVEN  --  2.52* 1.61    Estimated Creatinine Clearance: 139.5 mL/min (by C-G formula based on SCr of 0.72 mg/dL).    Allergies  Allergen Reactions  . Hydromorphone Hcl Anaphylaxis and Hives  . Meperidine Hcl Anaphylaxis and Hives    REACTION: ?anaphylaxis  . Morphine Anaphylaxis and Hives    Anything in this family REACTION: ?anaphyaxis  . Lisinopril Cough  . Sumatriptan Other (See Comments)    Makes headaches worse.   . Tape Rash    EKG LEADS BLISTER THE PATIENT'S SKIN AND LEAVE RED, RAISED AREAS!!  . Zolmitriptan Other (See Comments)    Headache    Antimicrobials this admission: 9/20 vancomycin > 9/20 zosyn >  Dose adjustments this admission: NA  Microbiology results: 9/19 urine cx: 9/19 blood cx:  Thank you for allowing pharmacy to be a part of this patient's care.  Baldemar FridayMasters, Marielouise Amey M 11/24/2015 9:25 AM

## 2015-11-24 NOTE — ED Notes (Signed)
A Regular diet was ordered for Lunch. 

## 2015-11-24 NOTE — ED Notes (Signed)
Breakfast tray ordered at 0547-TY

## 2015-11-24 NOTE — ED Notes (Signed)
Admitting MD aware of patient's bilateral foot pain and of wearing CPAP at night d/t hx sleep apnea. See new orders.

## 2015-11-24 NOTE — ED Notes (Signed)
Provided patient with turkey sandwich and ice water per MD. 

## 2015-11-24 NOTE — Progress Notes (Signed)
*  PRELIMINARY RESULTS* Vascular Ultrasound Right upper extremity venous duplex has been completed.  Preliminary findings: No evidence of DVT or superficial thrombosis.  Farrel DemarkJill Eunice, RDMS, RVT  11/24/2015, 11:24 AM

## 2015-11-24 NOTE — ED Notes (Signed)
Patient CBG was 244, the Nurse was informed.

## 2015-11-24 NOTE — Progress Notes (Addendum)
Patient seen and examined  40 y.o. female with medical history significant of allergy, anemia, bipolar disorder, type 2 diabetes, morbid obesity who comes emergency department due to right middle finger infection.  The patient was admitted in July due to right middle finger cellulitis. Her right middle fingernail was removed. She states that   she had a small wound in the area and call her PCP who called in an antibiotic for her. Her last antibiotic course of doxycycline was 3 weeks ago. She is status post Right middle finger abscess with extensor tendon tenosynovitis.  Plan  consulted  Dionne AnoWilliam M. Gramig, M.D.-Discussed with Karie ChimeraBrian Buchanan , if MRI is negative , no indication for any surgery  Check uric acid Continue antibiotics Venous Doppler right upper extremity to rule out DVT MRI to r/o myositis,

## 2015-11-24 NOTE — Progress Notes (Signed)
Pt admitted to 6N04 from ED.  Pt AAO X 4.  Pt on RA.  Pt has 18G to Rt AC with fluids infusing.  Pt has swelling to rt hand, pointer finger.  [facility] [place]  Pt has no complaints at the moment.  Will continue to monitor.

## 2015-11-24 NOTE — ED Notes (Signed)
Spoke with Toni Amendourtney in pharmacy about compatibility of Vanc and NaCl with Kcl, she advised okay to run together.

## 2015-11-24 NOTE — ED Notes (Signed)
Gave pt washcloths and shampoo to bathe in room.

## 2015-11-25 ENCOUNTER — Encounter (HOSPITAL_COMMUNITY): Payer: Self-pay | Admitting: General Practice

## 2015-11-25 DIAGNOSIS — E1165 Type 2 diabetes mellitus with hyperglycemia: Secondary | ICD-10-CM

## 2015-11-25 LAB — COMPREHENSIVE METABOLIC PANEL
ALK PHOS: 67 U/L (ref 38–126)
ALT: 11 U/L — AB (ref 14–54)
ANION GAP: 4 — AB (ref 5–15)
AST: 13 U/L — ABNORMAL LOW (ref 15–41)
Albumin: 2.7 g/dL — ABNORMAL LOW (ref 3.5–5.0)
BILIRUBIN TOTAL: 0.4 mg/dL (ref 0.3–1.2)
BUN: 9 mg/dL (ref 6–20)
CALCIUM: 8.5 mg/dL — AB (ref 8.9–10.3)
CO2: 26 mmol/L (ref 22–32)
CREATININE: 0.69 mg/dL (ref 0.44–1.00)
Chloride: 111 mmol/L (ref 101–111)
Glucose, Bld: 250 mg/dL — ABNORMAL HIGH (ref 65–99)
Potassium: 3.9 mmol/L (ref 3.5–5.1)
SODIUM: 141 mmol/L (ref 135–145)
TOTAL PROTEIN: 5.6 g/dL — AB (ref 6.5–8.1)

## 2015-11-25 LAB — URINE CULTURE

## 2015-11-25 LAB — CBC
HEMATOCRIT: 31 % — AB (ref 36.0–46.0)
HEMOGLOBIN: 10.1 g/dL — AB (ref 12.0–15.0)
MCH: 28.1 pg (ref 26.0–34.0)
MCHC: 32.6 g/dL (ref 30.0–36.0)
MCV: 86.4 fL (ref 78.0–100.0)
Platelets: 204 10*3/uL (ref 150–400)
RBC: 3.59 MIL/uL — AB (ref 3.87–5.11)
RDW: 13.7 % (ref 11.5–15.5)
WBC: 5.7 10*3/uL (ref 4.0–10.5)

## 2015-11-25 LAB — GLUCOSE, CAPILLARY
GLUCOSE-CAPILLARY: 196 mg/dL — AB (ref 65–99)
Glucose-Capillary: 203 mg/dL — ABNORMAL HIGH (ref 65–99)
Glucose-Capillary: 179 mg/dL — ABNORMAL HIGH (ref 65–99)
Glucose-Capillary: 219 mg/dL — ABNORMAL HIGH (ref 65–99)

## 2015-11-25 MED ORDER — INSULIN GLARGINE 100 UNIT/ML ~~LOC~~ SOLN
25.0000 [IU] | Freq: Every day | SUBCUTANEOUS | Status: DC
  Administered 2015-11-26: 25 [IU] via SUBCUTANEOUS
  Filled 2015-11-25: qty 0.25

## 2015-11-25 MED ORDER — ENOXAPARIN SODIUM 80 MG/0.8ML ~~LOC~~ SOLN
70.0000 mg | SUBCUTANEOUS | Status: DC
  Administered 2015-11-25: 70 mg via SUBCUTANEOUS
  Filled 2015-11-25: qty 0.8

## 2015-11-25 MED ORDER — CEFAZOLIN SODIUM-DEXTROSE 2-4 GM/100ML-% IV SOLN
2.0000 g | Freq: Three times a day (TID) | INTRAVENOUS | Status: DC
  Administered 2015-11-25 – 2015-11-26 (×4): 2 g via INTRAVENOUS
  Filled 2015-11-25 (×6): qty 100

## 2015-11-25 MED ORDER — INSULIN GLARGINE 100 UNIT/ML ~~LOC~~ SOLN
10.0000 [IU] | Freq: Once | SUBCUTANEOUS | Status: AC
  Administered 2015-11-25: 10 [IU] via SUBCUTANEOUS
  Filled 2015-11-25: qty 0.1

## 2015-11-25 NOTE — Progress Notes (Addendum)
Inpatient Diabetes Program Recommendations  AACE/ADA: New Consensus Statement on Inpatient Glycemic Control (2015)  Target Ranges:  Prepandial:   less than 140 mg/dL      Peak postprandial:   less than 180 mg/dL (1-2 hours)      Critically ill patients:  140 - 180 mg/dL  Results for [name] [name], [name] [name] (MRN [id number]) as of 11/25/2015 10:30  Ref. Range 11/24/2015 11:46 11/24/2015 18:43 11/24/2015 22:20 11/25/2015 07:43  Glucose-Capillary Latest Ref Range: 65 - 99 mg/dL 409244 (H) 811215 (H) 914305 (H) 203 (H)   Results for [name] [name], [name] [name] (MRN [id number]) as of 11/25/2015 10:30  Ref. Range 09/14/2015 21:29 11/09/2015 16:20  Hemoglobin A1C Unknown 10.3 (H) 8.5   Review of Glycemic Control  Diabetes history: DM2 Outpatient Diabetes medications:  Metformin XR1000 mg QHS Current orders for Inpatient glycemic control: Lantus 15 units QHS, Novolog 0-9 units TID with meals  Inpatient Diabetes Program Recommendations: Insulin - Basal: Please consider increasing Lantus to 17 units QHS. Insulin - Meal Coverage: Post prandial glucose is consistently elevated.  Please consider ordering Novolog 5 units TID with meals for meal coverage if patient eats at least 50% of meals. HgbA1C: A1C 8.5% on 11/09/15 which was an improvement from previous A1C of 10.3% on 09/14/15.  Addendum 11/25/15@13 :23-Spoke with patient over the phone about diabetes and home regimen for diabetes control. Patient reports that she is followed by PCP for diabetes management and currently she takes Metformin XR 1000 mg QAH as an outpatient for diabetes control. Inquired about Lantus as noted on home medication list and patient reports that her doctor took her off Lantus 2 weeks ago because her glucose was controlled with Metformin only.  Patient states that she checks her glucose 1-2 times per day and that it is usually 120-150's mg/dl.  Discussed A1C results (8.5% on 11/09/15) which have improved from prior A1C of 10.3% on 09/14/15. Discussed glucose and A1C  goals. Patient inquired about why she is not taking her Metformin as an inpatient and explained that generally patients with diabetes are managed with insulin while inpatient due to hyperglycemia from stress and less predictability with oral DM meds. Encouraged patient to continue checking her glucose at home and following up with PCP and make sure she notifies her PCP if she notices that her glucose is trending back up. Patient verbalized understanding of information discussed and dhe states that she has no further questions at this time related to diabetes.  Thanks, Orlando PennerMarie Anzley Dibbern, RN, MSN, CDE Diabetes Coordinator Inpatient Diabetes Program [phone] (Team Pager from 8am to 5pm) [phone] ([facility] office) [phone] [facility] office) [phone] [facility] office)

## 2015-11-25 NOTE — Progress Notes (Signed)
Pharmacy Antibiotic Note  [name] [name] is a 40 y.o. female admitted on 11/23/2015 with osteomyelitis.  Pharmacy has been consulted for Cefazolin dosing.  Pt had been receiving Vancomycin & Zosyn.  Plan: Cefazolin 2g IV q8  Height: 5\' 8"  (172.7 cm) Weight: (!) 314 lb 13.1 oz (142.8 kg) IBW/kg (Calculated) : 63.9  Temp (24hrs), Avg:98.2 F (36.8 C), Min:97.6 F (36.4 C), Max:98.6 F (37 C)   Recent Labs Lab 11/23/15 2000 11/23/15 2008 11/23/15 2253 11/25/15 0416  WBC 6.7  --   --  5.7  CREATININE 0.72  --   --  0.69  LATICACIDVEN  --  2.52* 1.61  --     Estimated Creatinine Clearance: 139.5 mL/min (by C-G formula based on SCr of 0.69 mg/dL).    Allergies  Allergen Reactions  . Hydromorphone Hcl Anaphylaxis and Hives  . Meperidine Hcl Anaphylaxis and Hives    REACTION: ?anaphylaxis  . Morphine Anaphylaxis and Hives    Anything in this family REACTION: ?anaphyaxis  . Lisinopril Cough  . Sumatriptan Other (See Comments)    Makes headaches worse.   . Tape Rash    EKG LEADS BLISTER THE PATIENT'S SKIN AND LEAVE RED, RAISED AREAS!!  . Zolmitriptan Other (See Comments)    Headache    Antimicrobials this admission: vancomycin 9/20 >> 9/21 zosyn 9/20 >> 9/21 Cefazolin 9/21  Microbiology results: 9/19 blood cx: pending 9/19 urine cx: sugg recollect MRSA PCR neg  Thank you for allowing pharmacy to be a part of this patient's care.   Marisue HumbleKendra Laden Fieldhouse, PharmD Clinical Pharmacist [facility]

## 2015-11-25 NOTE — Progress Notes (Addendum)
[facility] PROGRESS NOTE  [name] [name]:[id number] DOB: [birth year] DOA: 11/23/2015   PCP: Rodrigo Ran, MD     Assessment/Plan: Principal Problem:   Cellulitis of right hand Active Problems:   Hypercholesterolemia   ANEMIA, IRON DEFICIENCY, UNSPEC.   Bipolar I disorder (HCC)   HYPERTENSION, BENIGN SYSTEMIC   Type 2 diabetes mellitus without complication, without long-term current use of insulin (HCC)   Cellulitis of finger of right hand    [name] [name] is a 40 y.o. female with medical history significant of allergy, anemia, bipolar disorder, type 2 diabetes, morbid obesity who comes emergency department due to right middle finger infection.  The patient was admitted in July due to right middle finger cellulitis. Her right middle fingernail was removed. She states that last week she had a small wound in the area and call her PCP who called in an antibiotic for her. However, her symptoms had not resolved and she started developing edema, erythema and worsening of pain this afternoon. She is states that her hand pain radiates all the way up her arm.  ED Course: The patient receive IV antibiotics and analgesics reporting partial relief. WBC was 6.7, hemoglobin was 11.7 g/dL, potassium was 3.4 mmol/liter. Imaging shows soft tissue swelling of the right middle finger  ASSESSMENT  AND PLAN  Cellulitis of right hand   Cellulitis of finger of right hand Tele ,Mild marrow edema in the distal phalanx of the long finger is worrisome for osteomyelitis. consulted  Dionne Ano. Amanda Pea, M.D 9/20 .-Discussed with [name] ,they had requested an MRI , with results as above,called them back 9/21 with results ,he recommended ID consult Dr Ninetta Lights recommended iv cefazolin for one week and keflex 500 mg QID for 3 months and outpt follow up Continue analgesics as needed. Follow up with ortho as needed      Hypercholesterolemia Currently not taking medication. Heart  healthy diet while in the hospital. Follow-up with primary care provider.    ANEMIA, IRON DEFICIENCY, UNSPEC. Monitor hematocrit and hemoglobin.    Bipolar I disorder (HCC) The patient will be starting Lamictal and Effexor this week, since he was just prescribed. She declined to start them while in the hospital.    HYPERTENSION, BENIGN SYSTEMIC Not on antihypertensives at this time. Monitor blood pressure periodically    Type 2 diabetes mellitus without complication, without long-term current use of insulin (HCC) Hemoglobin A1c was 8.5% 2 weeks ago. Currently using Lantus. Increase to 25 units  Hold metformin 1000 mg by mouth at bedtime.     Obstructive sleep apnea CPAP at bedtime.     DVT prophylaxsis LOVENOX , will hold pending hand surgery consult   Code Status:  Full code    Family Communication: Discussed in detail with the patient, all imaging results, lab results explained to the patient   Disposition Plan:  Iv abx ,HH     Consultants:  Ortho/hand   Procedures:  None   Antibiotics: Anti-infectives    Start     Dose/Rate Route Frequency Ordered Stop   11/24/15 1600  piperacillin-tazobactam (ZOSYN) IVPB 3.375 g     3.375 g 12.5 mL/hr over 240 Minutes Intravenous Every 8 hours 11/24/15 0929     11/24/15 1000  vancomycin (VANCOCIN) 1,250 mg in sodium chloride 0.9 % 250 mL IVPB     1,250 mg 166.7 mL/hr over 90 Minutes Intravenous Every 8 hours 11/24/15 0929        HPI/Subjective: Still  complaining of pain in right middle finger   Objective: Vitals:   11/24/15 1334 11/24/15 2221 11/24/15 2315 11/25/15 0901  BP: 136/85 113/75  127/83  Pulse: 84 77 81 78  Resp: 18 17 18 20   Temp: 98.6 F (37 C) 97.6 F (36.4 C)  98.5 F (36.9 C)  TempSrc: Oral Oral  Oral  SpO2: 100% 99% 100% 100%  Weight: (!) 142.8 kg (314 lb 13.1 oz)     Height: 5\' 8"  (1.727 m)       Intake/Output Summary (Last 24 hours) at 11/25/15 1040 Last data filed at 11/25/15  1610  Gross per 24 hour  Intake          2255.83 ml  Output              300 ml  Net          1955.83 ml    Exam:  Examination:  General exam: Appears calm and comfortable  Respiratory system: Clear to auscultation. Respiratory effort normal. Cardiovascular system: S1 & S2 heard, RRR. No JVD, murmurs, rubs, gallops or clicks. No pedal edema. Gastrointestinal system: Abdomen is nondistended, soft and nontender. No organomegaly or masses felt. Normal bowel sounds heard. Central nervous system: Alert and oriented. No focal neurological deficits. Musculoskeletal: Positive tenderness, erythema, edema and calor right middle finger. Skin: No rashes, lesions or ulcers Psychiatry: Judgement and insight appear normal. Mood & affect appropriate.     Data Reviewed: I have personally reviewed following labs and imaging studies  Micro Results Recent Results (from the past 240 hour(s))  Urine culture     Status: Abnormal   Collection Time: 11/23/15  8:48 PM  Result Value Ref Range Status   Specimen Description URINE, RANDOM  Final   Special Requests NONE  Final   Culture MULTIPLE SPECIES PRESENT, SUGGEST RECOLLECTION (A)  Final   Report Status 11/25/2015 FINAL  Final  MRSA PCR Screening     Status: None   Collection Time: 11/24/15  8:13 PM  Result Value Ref Range Status   MRSA by PCR NEGATIVE NEGATIVE Final    Comment:        The GeneXpert MRSA Assay (FDA approved for NASAL specimens only), is one component of a comprehensive MRSA colonization surveillance program. It is not intended to diagnose MRSA infection nor to guide or monitor treatment for MRSA infections.     Radiology Reports Mr Hand Right Wo Contrast  Result Date: 11/25/2015 CLINICAL DATA:  History of right long finger cellulitis in July, 2017 with subsequent removal of the fingernail. The patient developed a wound on the right long finger approximately 1 week ago. Swelling, pain and erythema have worsened 11/23/2015.  EXAM: MRI OF THE RIGHT HAND WITHOUT CONTRAST; MRI OF THE RIGHT FOREARM WITHOUT CONTRAST TECHNIQUE: Multiplanar, multisequence MR imaging was performed. No intravenous contrast was administered. COMPARISON:  None. FINDINGS: HAND: There is some subcutaneous edema about the hand most notable at the distal aspect of the long finger. Increased T2 and decreased T1 signal are seen throughout the distal phalanx of the long finger. Bone marrow signal is otherwise normal. No joint effusion is seen. No abscess is identified. There is some osteophytosis about the DIP joint of the long finger. All imaged muscles and tendons are intact and demonstrate normal signal. No ligament or pulley tear is identified. FOREARM: Mild appearing subcutaneous edema about the forearm is most notable posteriorly and medially. No abscess is identified. There is no bone marrow signal abnormality to suggest  osteomyelitis. Marrow signal is normal throughout. All imaged muscles and tendons appear intact. No elbow joint effusion is identified. IMPRESSION: Mild marrow edema in the distal phalanx of the long finger is worrisome for osteomyelitis. Negative for abscess or septic joint. Mild subcutaneous edema about the forearm and hand could be due to cellulitis. Electronically Signed   By: Drusilla Kannerhomas  Dalessio M.D.   On: 11/25/2015 09:20   Mr Forearm Right W/o Cm  Result Date: 11/25/2015 CLINICAL DATA:  History of right long finger cellulitis in July, 2017 with subsequent removal of the fingernail. The patient developed a wound on the right long finger approximately 1 week ago. Swelling, pain and erythema have worsened 11/23/2015. EXAM: MRI OF THE RIGHT HAND WITHOUT CONTRAST; MRI OF THE RIGHT FOREARM WITHOUT CONTRAST TECHNIQUE: Multiplanar, multisequence MR imaging was performed. No intravenous contrast was administered. COMPARISON:  None. FINDINGS: HAND: There is some subcutaneous edema about the hand most notable at the distal aspect of the long finger.  Increased T2 and decreased T1 signal are seen throughout the distal phalanx of the long finger. Bone marrow signal is otherwise normal. No joint effusion is seen. No abscess is identified. There is some osteophytosis about the DIP joint of the long finger. All imaged muscles and tendons are intact and demonstrate normal signal. No ligament or pulley tear is identified. FOREARM: Mild appearing subcutaneous edema about the forearm is most notable posteriorly and medially. No abscess is identified. There is no bone marrow signal abnormality to suggest osteomyelitis. Marrow signal is normal throughout. All imaged muscles and tendons appear intact. No elbow joint effusion is identified. IMPRESSION: Mild marrow edema in the distal phalanx of the long finger is worrisome for osteomyelitis. Negative for abscess or septic joint. Mild subcutaneous edema about the forearm and hand could be due to cellulitis. Electronically Signed   By: Drusilla Kannerhomas  Dalessio M.D.   On: 11/25/2015 09:20   Dg Finger Middle Right  Result Date: 11/23/2015 CLINICAL DATA:  Redness, swelling, cellulitis EXAM: RIGHT MIDDLE FINGER 2+V COMPARISON:  None. FINDINGS: Mild soft tissue swelling. No radiopaque foreign body. Normal alignment. No acute osseous finding or focal osseous abnormality. Negative for fracture. IMPRESSION: Soft tissue swelling without acute osseous finding. Electronically Signed   By: Judie PetitM.  Shick M.D.   On: 11/23/2015 21:38     CBC  Recent Labs Lab 11/23/15 2000 11/25/15 0416  WBC 6.7 5.7  HGB 11.7* 10.1*  HCT 35.8* 31.0*  PLT 213 204  MCV 86.1 86.4  MCH 28.1 28.1  MCHC 32.7 32.6  RDW 13.5 13.7  LYMPHSABS 2.6  --   MONOABS 0.4  --   EOSABS 0.3  --   BASOSABS 0.0  --     Chemistries   Recent Labs Lab 11/23/15 2000 11/25/15 0416  NA 139 141  K 3.4* 3.9  CL 108 111  CO2 23 26  GLUCOSE 258* 250*  BUN 8 9  CREATININE 0.72 0.69  CALCIUM 9.5 8.5*  AST 22 13*  ALT 14 11*  ALKPHOS 77 67  BILITOT 0.3 0.4    ------------------------------------------------------------------------------------------------------------------ estimated creatinine clearance is 139.5 mL/min (by C-G formula based on SCr of 0.69 mg/dL). ------------------------------------------------------------------------------------------------------------------ No results for input(s): HGBA1C in the last 72 hours. ------------------------------------------------------------------------------------------------------------------ No results for input(s): CHOL, HDL, LDLCALC, TRIG, CHOLHDL, LDLDIRECT in the last 72 hours. ------------------------------------------------------------------------------------------------------------------ No results for input(s): TSH, T4TOTAL, T3FREE, THYROIDAB in the last 72 hours.  Invalid input(s): FREET3 ------------------------------------------------------------------------------------------------------------------ No results for input(s): VITAMINB12, FOLATE, FERRITIN, TIBC, IRON, RETICCTPCT in the  last 72 hours.  Coagulation profile No results for input(s): INR, PROTIME in the last 168 hours.  No results for input(s): DDIMER in the last 72 hours.  Cardiac Enzymes No results for input(s): CKMB, TROPONINI, MYOGLOBIN in the last 168 hours.  Invalid input(s): CK ------------------------------------------------------------------------------------------------------------------ Invalid input(s): POCBNP   CBG:  Recent Labs Lab 11/24/15 1146 11/24/15 1843 11/24/15 2220 11/25/15 0743  GLUCAP 244* 215* 305* 203*       Studies: Mr Hand Right Wo Contrast  Result Date: 11/25/2015 CLINICAL DATA:  History of right long finger cellulitis in July, 2017 with subsequent removal of the fingernail. The patient developed a wound on the right long finger approximately 1 week ago. Swelling, pain and erythema have worsened 11/23/2015. EXAM: MRI OF THE RIGHT HAND WITHOUT CONTRAST; MRI OF THE RIGHT FOREARM  WITHOUT CONTRAST TECHNIQUE: Multiplanar, multisequence MR imaging was performed. No intravenous contrast was administered. COMPARISON:  None. FINDINGS: HAND: There is some subcutaneous edema about the hand most notable at the distal aspect of the long finger. Increased T2 and decreased T1 signal are seen throughout the distal phalanx of the long finger. Bone marrow signal is otherwise normal. No joint effusion is seen. No abscess is identified. There is some osteophytosis about the DIP joint of the long finger. All imaged muscles and tendons are intact and demonstrate normal signal. No ligament or pulley tear is identified. FOREARM: Mild appearing subcutaneous edema about the forearm is most notable posteriorly and medially. No abscess is identified. There is no bone marrow signal abnormality to suggest osteomyelitis. Marrow signal is normal throughout. All imaged muscles and tendons appear intact. No elbow joint effusion is identified. IMPRESSION: Mild marrow edema in the distal phalanx of the long finger is worrisome for osteomyelitis. Negative for abscess or septic joint. Mild subcutaneous edema about the forearm and hand could be due to cellulitis. Electronically Signed   By: Drusilla Kanner M.D.   On: 11/25/2015 09:20   Mr Forearm Right W/o Cm  Result Date: 11/25/2015 CLINICAL DATA:  History of right long finger cellulitis in July, 2017 with subsequent removal of the fingernail. The patient developed a wound on the right long finger approximately 1 week ago. Swelling, pain and erythema have worsened 11/23/2015. EXAM: MRI OF THE RIGHT HAND WITHOUT CONTRAST; MRI OF THE RIGHT FOREARM WITHOUT CONTRAST TECHNIQUE: Multiplanar, multisequence MR imaging was performed. No intravenous contrast was administered. COMPARISON:  None. FINDINGS: HAND: There is some subcutaneous edema about the hand most notable at the distal aspect of the long finger. Increased T2 and decreased T1 signal are seen throughout the distal  phalanx of the long finger. Bone marrow signal is otherwise normal. No joint effusion is seen. No abscess is identified. There is some osteophytosis about the DIP joint of the long finger. All imaged muscles and tendons are intact and demonstrate normal signal. No ligament or pulley tear is identified. FOREARM: Mild appearing subcutaneous edema about the forearm is most notable posteriorly and medially. No abscess is identified. There is no bone marrow signal abnormality to suggest osteomyelitis. Marrow signal is normal throughout. All imaged muscles and tendons appear intact. No elbow joint effusion is identified. IMPRESSION: Mild marrow edema in the distal phalanx of the long finger is worrisome for osteomyelitis. Negative for abscess or septic joint. Mild subcutaneous edema about the forearm and hand could be due to cellulitis. Electronically Signed   By: Drusilla Kanner M.D.   On: 11/25/2015 09:20   Dg Finger Middle Right  Result Date:  11/23/2015 CLINICAL DATA:  Redness, swelling, cellulitis EXAM: RIGHT MIDDLE FINGER 2+V COMPARISON:  None. FINDINGS: Mild soft tissue swelling. No radiopaque foreign body. Normal alignment. No acute osseous finding or focal osseous abnormality. Negative for fracture. IMPRESSION: Soft tissue swelling without acute osseous finding. Electronically Signed   By: Judie Petit.  Shick M.D.   On: 11/23/2015 21:38      Lab Results  Component Value Date   HGBA1C 8.5 11/09/2015   HGBA1C 10.3 (H) 09/14/2015   HGBA1C 7.6 07/02/2014   Lab Results  Component Value Date   LDLCALC 93 11/09/2015   CREATININE 0.69 11/25/2015       Scheduled Meds: . cyclobenzaprine  10 mg Oral QHS  . enoxaparin (LOVENOX) injection  40 mg Subcutaneous Q24H  . insulin aspart  0-9 Units Subcutaneous TID WC  . insulin glargine  10 Units Subcutaneous Once  . [START ON 11/26/2015] insulin glargine  25 Units Subcutaneous Daily  . loratadine  10 mg Oral Daily  . piperacillin-tazobactam (ZOSYN)  IV  3.375 g  Intravenous Q8H  . vancomycin  1,250 mg Intravenous Q8H   Continuous Infusions:    LOS: 2 days    Time spent: >30 MINS    [facility]  [facility] Pager [phone]. If 7PM-7AM, please contact night-coverage at [email or web address], password [facility] 11/25/2015, 10:40 AM  LOS: 2 days

## 2015-11-26 ENCOUNTER — Other Ambulatory Visit: Payer: Self-pay

## 2015-11-26 LAB — COMPREHENSIVE METABOLIC PANEL
ALK PHOS: 66 U/L (ref 38–126)
ALT: 11 U/L — ABNORMAL LOW (ref 14–54)
ANION GAP: 7 (ref 5–15)
AST: 13 U/L — ABNORMAL LOW (ref 15–41)
Albumin: 2.7 g/dL — ABNORMAL LOW (ref 3.5–5.0)
BUN: 10 mg/dL (ref 6–20)
CALCIUM: 8.8 mg/dL — AB (ref 8.9–10.3)
CO2: 24 mmol/L (ref 22–32)
Chloride: 108 mmol/L (ref 101–111)
Creatinine, Ser: 0.5 mg/dL (ref 0.44–1.00)
GFR calc non Af Amer: >60 mL/min (ref >60)
Glucose, Bld: 251 mg/dL — ABNORMAL HIGH (ref 65–99)
POTASSIUM: 3.6 mmol/L (ref 3.5–5.1)
SODIUM: 139 mmol/L (ref 135–145)
TOTAL PROTEIN: 5.5 g/dL — AB (ref 6.5–8.1)
Total Bilirubin: 0.3 mg/dL (ref 0.3–1.2)

## 2015-11-26 LAB — CBC
HCT: 31 % — ABNORMAL LOW (ref 36.0–46.0)
HEMOGLOBIN: 10 g/dL — AB (ref 12.0–15.0)
MCH: 27.9 pg (ref 26.0–34.0)
MCHC: 32.3 g/dL (ref 30.0–36.0)
MCV: 86.4 fL (ref 78.0–100.0)
PLATELETS: 192 10*3/uL (ref 150–400)
RBC: 3.59 MIL/uL — ABNORMAL LOW (ref 3.87–5.11)
RDW: 13.7 % (ref 11.5–15.5)
WBC: 5.3 10*3/uL (ref 4.0–10.5)

## 2015-11-26 LAB — TROPONIN I

## 2015-11-26 LAB — GLUCOSE, CAPILLARY
Glucose-Capillary: 225 mg/dL — ABNORMAL HIGH (ref 65–99)
Glucose-Capillary: 234 mg/dL — ABNORMAL HIGH (ref 65–99)
Glucose-Capillary: 189 mg/dL — ABNORMAL HIGH (ref 65–99)

## 2015-11-26 MED ORDER — CEFAZOLIN SODIUM-DEXTROSE 2-4 GM/100ML-% IV SOLN: 2.0000 g | Freq: Three times a day (TID) | INTRAVENOUS | 0 refills | Status: AC

## 2015-11-26 MED ORDER — TRAMADOL HCL 50 MG PO TABS: 50.0000 mg | ORAL_TABLET | Freq: Four times a day (QID) | ORAL | 0 refills | Status: DC | PRN

## 2015-11-26 MED ORDER — HEPARIN SOD (PORK) LOCK FLUSH 100 UNIT/ML IV SOLN
250.0000 [IU] | INTRAVENOUS | Status: AC | PRN
  Administered 2015-11-26: 250 [IU]

## 2015-11-26 MED ORDER — CEPHALEXIN 500 MG PO CAPS
500.0000 mg | ORAL_CAPSULE | Freq: Four times a day (QID) | ORAL | 3 refills | Status: AC
Start: 2015-12-03 — End: 2015-12-31

## 2015-11-26 MED ORDER — SODIUM CHLORIDE 0.9% FLUSH: 10.0000 mL | INTRAVENOUS | Status: DC | PRN

## 2015-11-26 MED ORDER — INSULIN GLARGINE 100 UNIT/ML ~~LOC~~ SOLN: 30.0000 [IU] | Freq: Every day | SUBCUTANEOUS | 11 refills | Status: DC

## 2015-11-26 NOTE — Progress Notes (Signed)
Patient ID: [name] PatrickRhonda N [name], female   DOB: 01/30/1976, 40 y.o.   MRN: [id number] I have spoken with Mr Wynona [facility]Buchanan [facility] and reviewde the case We have also spoken at length with Ms Jens SomCrenshaw re her finger Given the MRI findings we would recommend no further surgery and treat presumptively for osteomyelitis-with organism based on the previous Cx of Staph A Would rec ID consult for duration of Abx and Abx regime Please call if there are any questions I will be out of the office the remainder of the week My partners and Mr Wynona [facility]Buchanan [facility] are aware and available Dashea Mcmullan MD

## 2015-11-26 NOTE — Care Management (Signed)
Patient aware of risks of PICC and no home health RN visit until Monday if she goes to the beach this weekend . Patient has decided to go to the beach. [facility] aware. Ronny FlurryHeather Jamelah Sitzer RN BSN [phone]

## 2015-11-26 NOTE — Progress Notes (Signed)
Discharge paperwork reviewed with patient. Prescription given. No questions verbalized. Patient is ready to discharge.

## 2015-11-26 NOTE — Progress Notes (Signed)
Inpatient Diabetes Program Recommendations  AACE/ADA: New Consensus Statement on Inpatient Glycemic Control (2015)  Target Ranges:  Prepandial:   less than 140 mg/dL      Peak postprandial:   less than 180 mg/dL (1-2 hours)      Critically ill patients:  140 - 180 mg/dL   Lab Results  Component Value Date   GLUCAP 225 (H) 11/26/2015   HGBA1C 8.5 11/09/2015    Review of Glycemic Control   Results for [name] [name], Tanazia N (MRN [id number]) as of 11/26/2015 10:47  Ref. Range 11/25/2015 07:43 11/25/2015 12:03 11/25/2015 17:18 11/25/2015 21:16 11/26/2015 08:08  Glucose-Capillary Latest Ref Range: 65 - 99 mg/dL 409203 (H) 811196 (H) 914179 (H) 219 (H) 225 (H)    Diabetes history: DM2 Outpatient Diabetes medications:  Metformin XR1000 mg QHS Current orders for Inpatient glycemic control: Lantus30 units QHS, Novolog 0-9 units TID with meals  Inpatient Diabetes Program Recommendations:Post prandial blood sugars remain elevated despite Novolog correction. Please consider adding Novolog 5 units tid (hold if patient eats less than 50%)  Susette RacerJulie Shanaiya Bene, RN, [place], [place], CDE Diabetes Coordinator Inpatient Diabetes Program  [phone] (Team Pager) [phone] [facility] Office) 11/26/2015 10:52 AM

## 2015-11-26 NOTE — Progress Notes (Signed)
[facility]  [facility]  [facility]  [facility] for [facility].  Pt will DC today and will leave for the beach until Sunday PM. [facility] will provide detailed hand on IV ABX administration teaching to support pt independence at DC. Pt aware she will not have HHRN services at the beach which is out of the [facility] service area for nursing. [facility] team will be available by phone to support pt with troubleshooting and advice should issues arise. [facility] will admit pt to services on Monday.   If patient discharges after hours, please call [phone].   [name] [name] 11/26/2015, 10:07 AM

## 2015-11-26 NOTE — Care Management (Signed)
Spoke with Dr Susie CassetteAbrol regarding patient wanting to leave hospital and go to the beach this weekend . [facility] can supply antibiotics , however she will not have home visit until Monday November 29, 2015 .  Dr Susie CassetteAbrol does not recommend patient go to the beach this weekend . Explained risks and concerns regarding PICC and no nursing visit until Monday . Patient is considering staying home this weekend . Will give patient time to think and follow up her her decision.   Ronny FlurryHeather Cabrini Ruggieri RN BSN [phone]

## 2015-11-26 NOTE — Progress Notes (Signed)
   11/26/15 1349  Vitals  Temp 98.8 F (37.1 C)  Temp Source Oral  BP 137/80  BP Location Right Arm  BP Method Automatic  Patient Position (if appropriate) Sitting  Pulse Rate 85  Pulse Rate Source Dinamap  Resp 16  Oxygen Therapy  SpO2 100 %  O2 Device Room Air  pt reported feeling left sided chest pain at rest. V/s as charted above. No s/s of respiratory distress noted physically. MD text paged

## 2015-11-26 NOTE — Progress Notes (Signed)
Peripherally Inserted Central Catheter/Midline Placement  The IV Nurse has discussed with the patient and/or persons authorized to consent for the patient, the purpose of this procedure and the potential benefits and risks involved with this procedure.  The benefits include less needle sticks, lab draws from the catheter, and the patient may be discharged home with the catheter. Risks include, but not limited to, infection, bleeding, blood clot (thrombus formation), and puncture of an artery; nerve damage and irregular heartbeat and possibility to perform a PICC exchange if needed/ordered by physician.  Alternatives to this procedure were also discussed.  Bard Power PICC patient education guide, fact sheet on infection prevention and patient information card has been provided to patient /or left at bedside.    PICC/Midline Placement Documentation        Oswald Pott, Lajean ManesKerry Loraine 11/26/2015, 8:13 AM

## 2015-11-26 NOTE — Discharge Summary (Addendum)
Physician Discharge Summary  [name] [name] MRN: [id number] DOB/AGE: 1975-12-15 40 y.o.  PCP: Kathrine Cords, MD   Admit date: 11/23/2015 Discharge date: 11/26/2015  Discharge Diagnoses:    Principal Problem:   Cellulitis of right hand Active Problems:   Hypercholesterolemia   ANEMIA, IRON DEFICIENCY, UNSPEC.   Bipolar I disorder (HCC)   HYPERTENSION, BENIGN SYSTEMIC   Cellulitis of finger of right hand   Diabetes mellitus with hyperglycemia ([place])    Follow-up recommendations Follow-up with PCP in 3-5 days , including all  additional recommended appointments as below Follow-up CBC, CMP in 3-5 days Patient to follow-up with Dr. Johnnye Sima within the next 3-4 weeks Continue iv cefazolin until 9/28      Current Discharge Medication List    START taking these medications   Details  ceFAZolin (ANCEF) 2-4 GM/100ML-% IVPB Inject 100 mLs (2 g total) into the vein every 8 (eight) hours. Qty: 21 each, Refills: 0    cephALEXin (KEFLEX) 500 MG capsule Take 1 capsule (500 mg total) by mouth 4 (four) times daily. Qty: 112 capsule, Refills: 3    insulin glargine (LANTUS) 100 UNIT/ML injection Inject 0.3 mLs (30 Units total) into the skin daily. Qty: 10 mL, Refills: 11      CONTINUE these medications which have NOT CHANGED   Details  cetirizine (ZYRTEC) 10 MG tablet Take 10 mg by mouth daily.     venlafaxine XR (EFFEXOR-XR) 150 MG 24 hr capsule Take 1 capsule (150 mg total) by mouth daily with breakfast. Qty: 30 capsule, Refills: 3    lamoTRIgine (LAMICTAL) 25 MG tablet Take 62m for 2 weeks, then 564mfor 2 weeks, then 10075mor 1 week, then resume 200m27mily Qty: 240 tablet, Refills: 0      STOP taking these medications     Insulin Glargine (LANTUS SOLOSTAR) 100 UNIT/ML Solostar Pen      metFORMIN (GLUCOPHAGE-XR) 500 MG 24 hr tablet      naproxen sodium (ALEVE) 220 MG tablet          Discharge Condition: Stable   Discharge Instructions Get Medicines reviewed  and adjusted: Please take all your medications with you for your next visit with your Primary MD  Please request your Primary MD to go over all hospital tests and procedure/radiological results at the follow up, please ask your Primary MD to get all Hospital records sent to his/her office.  If you experience worsening of your admission symptoms, develop shortness of breath, life threatening emergency, suicidal or homicidal thoughts you must seek medical attention immediately by calling 911 or calling your MD immediately if symptoms less severe.  You must read complete instructions/literature along with all the possible adverse reactions/side effects for all the Medicines you take and that have been prescribed to you. Take any new Medicines after you have completely understood and accpet all the possible adverse reactions/side effects.   Do not drive when taking Pain medications.   Do not take more than prescribed Pain, Sleep and Anxiety Medications  Special Instructions: If you have smoked or chewed Tobacco in the last 2 yrs please stop smoking, stop any regular Alcohol and or any Recreational drug use.  Wear Seat belts while driving.  Please note  You were cared for by a hospitalist during your hospital stay. Once you are discharged, your primary care physician will handle any further medical issues. Please note that NO REFILLS for any discharge medications will be authorized once you are discharged, as it is imperative that  you return to your primary care physician (or establish a relationship with a primary care physician if you do not have one) for your aftercare needs so that they can reassess your need for medications and monitor your lab values.  Discharge Instructions    Diet - low sodium heart healthy    Complete by:  As directed    Increase activity slowly    Complete by:  As directed        Allergies  Allergen Reactions  . Hydromorphone Hcl Anaphylaxis and Hives  .  Meperidine Hcl Anaphylaxis and Hives    REACTION: ?anaphylaxis  . Morphine Anaphylaxis and Hives    Anything in this family REACTION: ?anaphyaxis  . Lisinopril Cough  . Sumatriptan Other (See Comments)    Makes headaches worse.   . Tape Rash    EKG LEADS BLISTER THE PATIENT'S SKIN AND LEAVE RED, RAISED AREAS!!  . Zolmitriptan Other (See Comments)    Headache      Disposition: Home with home health   Consults: Orthopedics-multiple telephone communications Infectious disease-Dr. Hatcher-telephone communication     Significant Diagnostic Studies:  Mr Hand Right Wo Contrast  Result Date: 11/25/2015 CLINICAL DATA:  History of right long finger cellulitis in July, 2017 with subsequent removal of the fingernail. The patient developed a wound on the right long finger approximately 1 week ago. Swelling, pain and erythema have worsened 11/23/2015. EXAM: MRI OF THE RIGHT HAND WITHOUT CONTRAST; MRI OF THE RIGHT FOREARM WITHOUT CONTRAST TECHNIQUE: Multiplanar, multisequence MR imaging was performed. No intravenous contrast was administered. COMPARISON:  None. FINDINGS: HAND: There is some subcutaneous edema about the hand most notable at the distal aspect of the long finger. Increased T2 and decreased T1 signal are seen throughout the distal phalanx of the long finger. Bone marrow signal is otherwise normal. No joint effusion is seen. No abscess is identified. There is some osteophytosis about the DIP joint of the long finger. All imaged muscles and tendons are intact and demonstrate normal signal. No ligament or pulley tear is identified. FOREARM: Mild appearing subcutaneous edema about the forearm is most notable posteriorly and medially. No abscess is identified. There is no bone marrow signal abnormality to suggest osteomyelitis. Marrow signal is normal throughout. All imaged muscles and tendons appear intact. No elbow joint effusion is identified. IMPRESSION: Mild marrow edema in the distal  phalanx of the long finger is worrisome for osteomyelitis. Negative for abscess or septic joint. Mild subcutaneous edema about the forearm and hand could be due to cellulitis. Electronically Signed   By: Inge Rise M.D.   On: 11/25/2015 09:20   Mr Forearm Right W/o Cm  Result Date: 11/25/2015 CLINICAL DATA:  History of right long finger cellulitis in July, 2017 with subsequent removal of the fingernail. The patient developed a wound on the right long finger approximately 1 week ago. Swelling, pain and erythema have worsened 11/23/2015. EXAM: MRI OF THE RIGHT HAND WITHOUT CONTRAST; MRI OF THE RIGHT FOREARM WITHOUT CONTRAST TECHNIQUE: Multiplanar, multisequence MR imaging was performed. No intravenous contrast was administered. COMPARISON:  None. FINDINGS: HAND: There is some subcutaneous edema about the hand most notable at the distal aspect of the long finger. Increased T2 and decreased T1 signal are seen throughout the distal phalanx of the long finger. Bone marrow signal is otherwise normal. No joint effusion is seen. No abscess is identified. There is some osteophytosis about the DIP joint of the long finger. All imaged muscles and tendons are intact and demonstrate  normal signal. No ligament or pulley tear is identified. FOREARM: Mild appearing subcutaneous edema about the forearm is most notable posteriorly and medially. No abscess is identified. There is no bone marrow signal abnormality to suggest osteomyelitis. Marrow signal is normal throughout. All imaged muscles and tendons appear intact. No elbow joint effusion is identified. IMPRESSION: Mild marrow edema in the distal phalanx of the long finger is worrisome for osteomyelitis. Negative for abscess or septic joint. Mild subcutaneous edema about the forearm and hand could be due to cellulitis. Electronically Signed   By: Inge Rise M.D.   On: 11/25/2015 09:20   Dg Finger Middle Right  Result Date: 11/23/2015 CLINICAL DATA:  Redness,  swelling, cellulitis EXAM: RIGHT MIDDLE FINGER 2+V COMPARISON:  None. FINDINGS: Mild soft tissue swelling. No radiopaque foreign body. Normal alignment. No acute osseous finding or focal osseous abnormality. Negative for fracture. IMPRESSION: Soft tissue swelling without acute osseous finding. Electronically Signed   By: Jerilynn Mages.  Shick M.D.   On: 11/23/2015 21:38       Filed Weights   11/23/15 1947 11/24/15 1334  Weight: (!) 142.9 kg (315 lb) (!) 142.8 kg (314 lb 13.1 oz)     Microbiology: Recent Results (from the past 240 hour(s))  Culture, blood (Routine x 2)     Status: None (Preliminary result)   Collection Time: 11/23/15  8:00 PM  Result Value Ref Range Status   Specimen Description BLOOD RIGHT HAND  Final   Special Requests BOTTLES DRAWN AEROBIC AND ANAEROBIC 5CC  Final   Culture NO GROWTH 2 DAYS  Final   Report Status PENDING  Incomplete  Culture, blood (Routine x 2)     Status: None (Preliminary result)   Collection Time: 11/23/15  8:07 PM  Result Value Ref Range Status   Specimen Description BLOOD LEFT ANTECUBITAL  Final   Special Requests BOTTLES DRAWN AEROBIC AND ANAEROBIC 5CC  Final   Culture NO GROWTH 2 DAYS  Final   Report Status PENDING  Incomplete  Urine culture     Status: Abnormal   Collection Time: 11/23/15  8:48 PM  Result Value Ref Range Status   Specimen Description URINE, RANDOM  Final   Special Requests NONE  Final   Culture MULTIPLE SPECIES PRESENT, SUGGEST RECOLLECTION (A)  Final   Report Status 11/25/2015 FINAL  Final  MRSA PCR Screening     Status: None   Collection Time: 11/24/15  8:13 PM  Result Value Ref Range Status   MRSA by PCR NEGATIVE NEGATIVE Final    Comment:        The GeneXpert MRSA Assay (FDA approved for NASAL specimens only), is one component of a comprehensive MRSA colonization surveillance program. It is not intended to diagnose MRSA infection nor to guide or monitor treatment for MRSA infections.        Blood Culture     Component Value Date/Time   SDES URINE, RANDOM 11/23/2015 2048   SPECREQUEST NONE 11/23/2015 2048   CULT MULTIPLE SPECIES PRESENT, SUGGEST RECOLLECTION (A) 11/23/2015 2048   REPTSTATUS 11/25/2015 FINAL 11/23/2015 2048      Labs: Results for orders placed or performed during the hospital encounter of 11/23/15 (from the past 48 hour(s))  Uric acid     Status: None   Collection Time: 11/24/15 10:21 AM  Result Value Ref Range   Uric Acid, Serum 3.8 2.3 - 6.6 mg/dL  CBG monitoring, ED     Status: Abnormal   Collection Time: 11/24/15 11:46 AM  Result  Value Ref Range   Glucose-Capillary 244 (H) 65 - 99 mg/dL   Comment 1 Notify RN    Comment 2 Document in Chart   Glucose, capillary     Status: Abnormal   Collection Time: 11/24/15  6:43 PM  Result Value Ref Range   Glucose-Capillary 215 (H) 65 - 99 mg/dL   Comment 1 Notify RN    Comment 2 Document in Chart   MRSA PCR Screening     Status: None   Collection Time: 11/24/15  8:13 PM  Result Value Ref Range   MRSA by PCR NEGATIVE NEGATIVE    Comment:        The GeneXpert MRSA Assay (FDA approved for NASAL specimens only), is one component of a comprehensive MRSA colonization surveillance program. It is not intended to diagnose MRSA infection nor to guide or monitor treatment for MRSA infections.   Glucose, capillary     Status: Abnormal   Collection Time: 11/24/15 10:20 PM  Result Value Ref Range   Glucose-Capillary 305 (H) 65 - 99 mg/dL  CBC     Status: Abnormal   Collection Time: 11/25/15  4:16 AM  Result Value Ref Range   WBC 5.7 4.0 - 10.5 K/uL   RBC 3.59 (L) 3.87 - 5.11 MIL/uL   Hemoglobin 10.1 (L) 12.0 - 15.0 g/dL   HCT 31.0 (L) 36.0 - 46.0 %   MCV 86.4 78.0 - 100.0 fL   MCH 28.1 26.0 - 34.0 pg   MCHC 32.6 30.0 - 36.0 g/dL   RDW 13.7 11.5 - 15.5 %   Platelets 204 150 - 400 K/uL  Comprehensive metabolic panel     Status: Abnormal   Collection Time: 11/25/15  4:16 AM  Result Value Ref Range   Sodium 141 135 - 145  mmol/L   Potassium 3.9 3.5 - 5.1 mmol/L   Chloride 111 101 - 111 mmol/L   CO2 26 22 - 32 mmol/L   Glucose, Bld 250 (H) 65 - 99 mg/dL   BUN 9 6 - 20 mg/dL   Creatinine, Ser 0.69 0.44 - 1.00 mg/dL   Calcium 8.5 (L) 8.9 - 10.3 mg/dL   Total Protein 5.6 (L) 6.5 - 8.1 g/dL   Albumin 2.7 (L) 3.5 - 5.0 g/dL   AST 13 (L) 15 - 41 U/L   ALT 11 (L) 14 - 54 U/L   Alkaline Phosphatase 67 38 - 126 U/L   Total Bilirubin 0.4 0.3 - 1.2 mg/dL   GFR calc non Af Amer >60 >60 mL/min   GFR calc Af Amer >60 >60 mL/min    Comment: (NOTE) The eGFR has been calculated using the CKD EPI equation. This calculation has not been validated in all clinical situations. eGFR's persistently <60 mL/min signify possible Chronic Kidney Disease.    Anion gap 4 (L) 5 - 15  Glucose, capillary     Status: Abnormal   Collection Time: 11/25/15  7:43 AM  Result Value Ref Range   Glucose-Capillary 203 (H) 65 - 99 mg/dL   Comment 1 Notify RN   Glucose, capillary     Status: Abnormal   Collection Time: 11/25/15 12:03 PM  Result Value Ref Range   Glucose-Capillary 196 (H) 65 - 99 mg/dL   Comment 1 Notify RN   Glucose, capillary     Status: Abnormal   Collection Time: 11/25/15  5:18 PM  Result Value Ref Range   Glucose-Capillary 179 (H) 65 - 99 mg/dL   Comment 1 Notify RN  Glucose, capillary     Status: Abnormal   Collection Time: 11/25/15  9:16 PM  Result Value Ref Range   Glucose-Capillary 219 (H) 65 - 99 mg/dL  CBC     Status: Abnormal   Collection Time: 11/26/15  4:09 AM  Result Value Ref Range   WBC 5.3 4.0 - 10.5 K/uL   RBC 3.59 (L) 3.87 - 5.11 MIL/uL   Hemoglobin 10.0 (L) 12.0 - 15.0 g/dL   HCT 31.0 (L) 36.0 - 46.0 %   MCV 86.4 78.0 - 100.0 fL   MCH 27.9 26.0 - 34.0 pg   MCHC 32.3 30.0 - 36.0 g/dL   RDW 13.7 11.5 - 15.5 %   Platelets 192 150 - 400 K/uL  Comprehensive metabolic panel     Status: Abnormal   Collection Time: 11/26/15  4:09 AM  Result Value Ref Range   Sodium 139 135 - 145 mmol/L    Potassium 3.6 3.5 - 5.1 mmol/L   Chloride 108 101 - 111 mmol/L   CO2 24 22 - 32 mmol/L   Glucose, Bld 251 (H) 65 - 99 mg/dL   BUN 10 6 - 20 mg/dL   Creatinine, Ser 0.50 0.44 - 1.00 mg/dL   Calcium 8.8 (L) 8.9 - 10.3 mg/dL   Total Protein 5.5 (L) 6.5 - 8.1 g/dL   Albumin 2.7 (L) 3.5 - 5.0 g/dL   AST 13 (L) 15 - 41 U/L   ALT 11 (L) 14 - 54 U/L   Alkaline Phosphatase 66 38 - 126 U/L   Total Bilirubin 0.3 0.3 - 1.2 mg/dL   GFR calc non Af Amer >60 >60 mL/min   GFR calc Af Amer >60 >60 mL/min    Comment: (NOTE) The eGFR has been calculated using the CKD EPI equation. This calculation has not been validated in all clinical situations. eGFR's persistently <60 mL/min signify possible Chronic Kidney Disease.    Anion gap 7 5 - 15  Glucose, capillary     Status: Abnormal   Collection Time: 11/26/15  8:08 AM  Result Value Ref Range   Glucose-Capillary 225 (H) 65 - 99 mg/dL   Comment 1 Notify RN      Lipid Panel     Component Value Date/Time   CHOL 208 (H) 11/09/2015 1617   TRIG 311 (H) 11/09/2015 1617   HDL 53 11/09/2015 1617   CHOLHDL 3.9 11/09/2015 1617   VLDL 62 (H) 11/09/2015 1617   LDLCALC 93 11/09/2015 1617   LDLDIRECT 130 (H) 08/30/2009 2217     Lab Results  Component Value Date   HGBA1C 8.5 11/09/2015   HGBA1C 10.3 (H) 09/14/2015   HGBA1C 7.6 07/02/2014        HPI :   [name] [name] a 40 y.o.femalewith medical history significant of allergy, anemia, bipolar disorder, type 2 diabetes, morbid obesity who comes emergency department due to right middle finger infection.  The patient was admitted in July due to right middle finger cellulitis. Her right middle fingernail was removed. She states that last week she had a small wound in the area and call her PCP who called in an antibiotic for her. However, her symptoms had not resolved and she started developing edema, erythema and worsening of pain this afternoon. She is states that her hand pain radiates all  the way up her arm.  ED Course:The patient receive IV antibiotics and analgesicsreporting partial relief. WBC was 6.7, hemoglobin was 11.7 g/dL, potassium was 3.4 mmol/liter. Imaging shows soft tissue  swelling of the right middle finger  HOSPITAL COURSE:    Cellulitis of right hand Cellulitis of finger of right hand MRI showed ,Mild marrow edema in the distal phalanx of the long finger is worrisome for osteomyelitis.consulted Satira Anis. Amedeo Plenty, M.D 9/20 .-Discussed with Avelina Laine ,they had requested an MRI , with results as above,called them back 9/21 with results ,he recommended ID consult, no indication for surgery  Dr Johnnye Sima recommended iv cefazolin for one week and keflex 500 mg QID for 3 months and outpt follow up Continue iv cefazolin until 9/28, picc line placed  Continue analgesics as needed. Follow up with ortho as needed    Hypercholesterolemia Currently not taking medication. Heart healthy diet while in the hospital. Follow-up with primary care provider.  ANEMIA, IRON DEFICIENCY, UNSPEC. Monitor hematocrit and hemoglobin.  Bipolar I disorder ([place]) The patient will be starting Lamictal and Effexor this week, since he was just prescribed. She declined to start them while in the hospital.  HYPERTENSION, BENIGN SYSTEMIC Not on antihypertensives at this time. Monitor blood pressure periodically  Type 2 diabetes mellitus without complication, without long-term current use of insulin (HCC) Hemoglobin A1c was 8.5% 2 weeks ago. Currently using Lantus. Increase to 25 units  Hold metformin 1000 mg by mouth at bedtime.   Obstructive sleep apnea CPAP at bedtime.    Discharge Exam:   Blood pressure 115/65, pulse 72, temperature 97.8 F (36.6 C), temperature source Oral, resp. rate 18, height 5' 8" (1.727 m), weight (!) 142.8 kg (314 lb 13.1 oz), SpO2 99 %.   General exam: Appears calm and comfortable  Respiratory system: Clear to  auscultation. Respiratory effort normal. Cardiovascular system: S1 & S2 heard, RRR. No JVD, murmurs, rubs, gallops or clicks. No pedal edema. Gastrointestinal system: Abdomen is nondistended, soft and nontender. No organomegaly or masses felt. Normal bowel sounds heard. Central nervous system: Alert and oriented. No focal neurological deficits. Musculoskeletal:Positive tenderness, erythema, edema and calor right middle finger. Skin: No rashes, lesions or ulcers Psychiatry: Judgement and insight appear normal. Mood & affect appropriate.     Follow-up Information    Kathrine Cords, MD. Schedule an appointment as soon as possible for a visit today.   Specialty:  Family Medicine Why:  Hospital follow-up Contact information: [address] [place] [address] [phone]        Bobby Rumpf, MD. Schedule an appointment as soon as possible for a visit in 2 week(s).   Specialty:  Infectious Diseases Why:  Please call to make this appointment Contact information: [address] [place] [address] [phone]           Signed: Reyne [name] 11/26/2015, 9:48 AM        Time spent >45 mins

## 2015-11-28 LAB — CULTURE, BLOOD (ROUTINE X 2)
CULTURE: NO GROWTH
Culture: NO GROWTH

## 2015-12-01 ENCOUNTER — Emergency Department (HOSPITAL_COMMUNITY)
Admission: EM | Admit: 2015-12-01 | Discharge: 2015-12-02 | Disposition: A | Discharge status: Home / Self Care | Payer: Medicaid Other | Attending: Emergency Medicine | Admitting: Emergency Medicine

## 2015-12-01 ENCOUNTER — Encounter (HOSPITAL_COMMUNITY): Payer: Self-pay | Admitting: Emergency Medicine

## 2015-12-01 DIAGNOSIS — I10 Essential (primary) hypertension: Secondary | ICD-10-CM | POA: Diagnosis not present

## 2015-12-01 DIAGNOSIS — Z794 Long term (current) use of insulin: Secondary | ICD-10-CM | POA: Insufficient documentation

## 2015-12-01 DIAGNOSIS — E119 Type 2 diabetes mellitus without complications: Secondary | ICD-10-CM | POA: Diagnosis not present

## 2015-12-01 DIAGNOSIS — M79602 Pain in left arm: Secondary | ICD-10-CM

## 2015-12-01 DIAGNOSIS — Z79899 Other long term (current) drug therapy: Secondary | ICD-10-CM | POA: Diagnosis not present

## 2015-12-01 MED ORDER — IBUPROFEN 400 MG PO TABS
400.0000 mg | ORAL_TABLET | Freq: Once | ORAL | Status: AC | PRN
  Administered 2015-12-01: 400 mg via ORAL
  Filled 2015-12-01: qty 1

## 2015-12-01 NOTE — ED Notes (Signed)
Pt up to RN station, states pain is increasing in her arm.

## 2015-12-01 NOTE — ED Triage Notes (Signed)
Pt. reports pain at insertion site of her PICC line that was removed by home health nurse this afternoon , pain started this evening at left upper arm . No bleeding at arrival/ dressing intact .

## 2015-12-02 ENCOUNTER — Ambulatory Visit (HOSPITAL_COMMUNITY)
Admission: RE | Admit: 2015-12-02 | Discharge: 2015-12-02 | Disposition: A | Discharge status: Home / Self Care | Payer: Medicaid Other | Source: Ambulatory Visit | Attending: Emergency Medicine | Admitting: Emergency Medicine

## 2015-12-02 DIAGNOSIS — M79602 Pain in left arm: Secondary | ICD-10-CM | POA: Insufficient documentation

## 2015-12-02 DIAGNOSIS — M79609 Pain in unspecified limb: Secondary | ICD-10-CM

## 2015-12-02 DIAGNOSIS — I82612 Acute embolism and thrombosis of superficial veins of left upper extremity: Secondary | ICD-10-CM | POA: Diagnosis not present

## 2015-12-02 MED ORDER — KETOROLAC TROMETHAMINE 60 MG/2ML IM SOLN
60.0000 mg | Freq: Once | INTRAMUSCULAR | Status: AC
  Administered 2015-12-02: 60 mg via INTRAMUSCULAR
  Filled 2015-12-02: qty 2

## 2015-12-02 NOTE — ED Provider Notes (Addendum)
[facility]-EMERGENCY DEPT Provider Note   CSN: [id number] Arrival date & time: 12/01/15  2103  By signing my name below, I, Sandrea HammondStephen Dignan, attest that this documentation has been prepared under the direction and in the presence of Gilda Creasehristopher J Pollina, MD. Electronically Signed: Sandrea HammondStephen Dignan, ED Scribe. 12/02/15. 12:40 AM.   History   Chief Complaint Chief Complaint  Patient presents with  . Arm Pain    HPI Comments: [name] [name] is a 40 y.o. female who presents to the Emergency Department complaining of pain in her left hand and arm after a PICC line was removed earlier today. She says she has pain over her entire left hand and left arm. Pt says she has taken no medication at home for pain. Per chart review, pt was admitted on 09/19 due to chronic pain of the right upper extremity. Pt has no other complaints at this time.   Exam No drainage at site Exaqm pcc line site on left upper arm -drainage, red, swell   HPI  Past Medical History:  Diagnosis Date  . Allergy   . Anemia   . Bipolar 1 disorder [facility](HCC)    Sees Dr. Ledon SnareMcKnight, psychology,  [phone]  . Diabetes mellitus without complication (HCC)   . Family history of adverse reaction to anesthesia    mother & son have difficulty waking  . Fatigue   . Sleep apnea    uses cpap    Patient Active Problem List   Diagnosis Date Noted  . Diabetes mellitus with hyperglycemia (HCC) 11/25/2015  . Cellulitis of right hand 11/23/2015  . Pain   . Cellulitis 09/14/2015  . Abscess of finger 09/14/2015  . Cellulitis of finger of right hand 09/14/2015  . Type 2 diabetes mellitus without complication, without long-term current use of insulin (HCC)   . Abdominal pain, left lower quadrant 07/13/2014  . Groin pain, chronic, right 07/13/2014  . Lung nodule seen on imaging study 09/24/2013  . Chest pain 09/24/2013  . Diabetes mellitus (HCC) 07/11/2012  . Pollen allergies 07/11/2012  . 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 Meralgia  paresthetica of left side 08/18/2010  . Fatigue 04/20/2010  . INTERSTITIAL CYSTITIS 07/22/2007  . MENOPAUSE, SURGICAL 04/24/2007  . HIATAL HERNIA 11/20/2006  . Hypercholesterolemia 05/03/2006  . OBESITY, NOS 05/03/2006  . ANEMIA, IRON DEFICIENCY, UNSPEC. 05/03/2006  . Bipolar I disorder (HCC) 05/03/2006  . ANXIETY 05/03/2006  . MIGRAINE, UNSPEC., W/O INTRACTABLE MIGRAINE 05/03/2006  . HYPERTENSION, BENIGN SYSTEMIC 05/03/2006  . PEPTIC ULCER DIS., UNSPEC. W/O OBSTRUCTION 05/03/2006    Past Surgical History:  Procedure Laterality Date  . ABDOMINAL HYSTERECTOMY    . I&D EXTREMITY Right 09/14/2015   Procedure: IRRIGATION AND DEBRIDEMENT RIGHT MIDDLE FINGER;  Surgeon: Dominica SeverinWilliam Gramig, MD;  Location: [facility] OR;  Service: Orthopedics;  Laterality: Right;  . TUBAL LIGATION      OB History    No data available       Home Medications    Prior to Admission medications   Medication Sig Start Date End Date Taking? Authorizing Provider  ceFAZolin (ANCEF) 2-4 GM/100ML-% IVPB Inject 100 mLs (2 g total) into the vein every 8 (eight) hours. 11/26/15 12/02/15  Richarda OverlieNayana Abrol, MD  cephALEXin (KEFLEX) 500 MG capsule Take 1 capsule (500 mg total) by mouth 4 (four) times daily. 12/03/15 12/31/15  Richarda OverlieNayana Abrol, MD  cetirizine (ZYRTEC) 10 MG tablet Take 10 mg by mouth daily.     Historical Provider, MD  insulin glargine (LANTUS) 100 UNIT/ML injection Inject 0.3 mLs (  30 Units total) into the skin daily. 11/26/15   Richarda Overlie, MD  lamoTRIgine (LAMICTAL) 25 MG tablet Take 25mg  for 2 weeks, then 50mg  for 2 weeks, then 100mg  for 1 week, then resume 200mg  daily 11/17/15   Joanna Puff, MD  traMADol (ULTRAM) 50 MG tablet Take 1 tablet (50 mg total) by mouth every 6 (six) hours as needed. 11/26/15   Richarda Overlie, MD  venlafaxine XR (EFFEXOR-XR) 150 MG 24 hr capsule Take 1 capsule (150 mg total) by mouth daily with breakfast. Patient taking differently: Take 150 mg by mouth at bedtime.  06/28/15   Joanna Puff, MD      Family History Family History  Problem Relation Age of Onset  . Hypertension Mother   . Diabetes Mellitus II Mother   . Congestive Heart Failure Father   . Hypertension Father   . Diabetes Mellitus II Father     Social History Social History  Substance Use Topics  . Smoking status: Never Smoker  . Smokeless tobacco: Never Used  . Alcohol use 0.0 oz/week     Comment: occ     Allergies   Hydromorphone hcl; Meperidine hcl; Morphine; Lisinopril; Sumatriptan; Tape; and Zolmitriptan   Review of Systems Review of Systems  Musculoskeletal: Positive for arthralgias.  All other systems reviewed and are negative.    Physical Exam Updated Vital Signs BP 133/98   Pulse 95   Temp 100 F (37.8 C) (Oral)   Resp 18   Ht 5\' 8"  (1.727 m)   SpO2 99%   Physical Exam  Constitutional: She is oriented to person, place, and time. She appears well-developed and well-nourished. No distress.  HENT:  Head: Normocephalic and atraumatic.  Right Ear: Hearing normal.  Left Ear: Hearing normal.  Nose: Nose normal.  Mouth/Throat: Oropharynx is clear and moist and mucous membranes are normal.  Eyes: Conjunctivae and EOM are normal. Pupils are equal, round, and reactive to light.  Neck: Normal range of motion. Neck supple.  Cardiovascular: Regular rhythm, S1 normal and S2 normal.  Exam reveals no gallop and no friction rub.   No murmur heard. Pulses:      Radial pulses are 2+ on the left side.  Normal capillary refill  Pulmonary/Chest: Effort normal and breath sounds normal. No respiratory distress. She exhibits no tenderness.  Abdominal: Soft. Normal appearance and bowel sounds are normal. There is no hepatosplenomegaly. There is no tenderness. There is no rebound, no guarding, no tenderness at McBurney's point and negative Murphy's sign. No hernia.  Musculoskeletal: Normal range of motion.  Normal grip strength, flexion extension of left upper arm  Neurological: She is alert and  oriented to person, place, and time. She has normal strength. No cranial nerve deficit or sensory deficit. Coordination normal. GCS eye subscore is 4. GCS verbal subscore is 5. GCS motor subscore is 6.  Reflex Scores:      Brachioradialis reflexes are 4+ on the right side. Normal sensation arm and hand across all nerve distributions  Skin: Skin is warm, dry and intact. No rash noted. No cyanosis.  PICC line site at left upper arm without purulence, erythema, induration, drainage  Psychiatric: She has a normal mood and affect. Her speech is normal and behavior is normal. Thought content normal.  Nursing note and vitals reviewed.    ED Treatments / Results   DIAGNOSTIC STUDIES: Oxygen Saturation is 100% on RA, normal by my interpretation.    COORDINATION OF CARE: 12:11 AM Discussed treatment plan with  pt at bedside and pt agreed to plan.  Labs (all labs ordered are listed, but only abnormal results are displayed) Labs Reviewed - No data to display  EKG  EKG Interpretation None       Radiology No results found.  Procedures Procedures (including critical care time)  Medications Ordered in ED Medications  ibuprofen (ADVIL,MOTRIN) tablet 400 mg (400 mg Oral Given 12/01/15 2349)  ketorolac (TORADOL) injection 60 mg (60 mg Intramuscular Given 12/02/15 0039)     Initial Impression / Assessment and Plan / ED Course  I have reviewed the triage vital signs and the nursing notes.  Pertinent labs & imaging results that were available during my care of the patient were reviewed by me and considered in my medical decision making (see chart for details).  Clinical Course   Patient presents to the ER for evaluation of left arm pain. Patient was recently hospitalized on September 19 for cellulitis of the right hand. There was evidence of osteomyelitis. Patient was on IV antibiotics until recently. PICC line was removed earlier today. Since removal patient has had increasing pain of the  entire left arm. She has palpable radial pulse and normal distal coloration. Patient has normal grip strength and range of motion of the arm. PICC line site appears clean. There is no drainage, erythema, induration or swelling. Patient will continue oral antibiotics for 3 months.  Based on the fact that she had a PICC line in place for more than a week, DVT must be ruled out. Patient will be scheduled for DVT study in the a.m.  Final Clinical Impressions(s) / ED Diagnoses   Final diagnoses:  Left arm pain    New Prescriptions New Prescriptions   No medications on file   I personally performed the services described in this documentation, which was scribed in my presence. The recorded information has been reviewed and is accurate.      Gilda Crease, MD 12/02/15 0040    Gilda Crease, MD 12/02/15 [phone]

## 2015-12-02 NOTE — ED Notes (Signed)
Patient verbalized understanding of discharge instructions and denies any further needs or questions at this time. VS stable. Patient ambulatory with steady gait. Pt states she will return here around 7:30-8am later today for her DVT study.

## 2015-12-02 NOTE — Progress Notes (Signed)
*  PRELIMINARY RESULTS* Vascular Ultrasound Left upper extremity venous duplex has been completed.  Preliminary findings: no evidence of DVT. Superficial thrombosis noted in the left cephalic vein.   Called results to Dr. Clydene PughKnott. Patient instructed to follow up with PCP and take aspirin, unless she has an allergy to aspirin.    Farrel DemarkJill Eunice, RDMS, RVT  12/02/2015, 8:11 AM

## 2015-12-14 ENCOUNTER — Other Ambulatory Visit: Payer: Self-pay | Admitting: *Deleted

## 2015-12-14 NOTE — Telephone Encounter (Signed)
Please send in new dosage for the Lantus SoloStar to [facility].  Clovis PuMartin, Loxley Cibrian L, RN

## 2015-12-15 MED ORDER — INSULIN GLARGINE 100 UNIT/ML SOLOSTAR PEN: 25.0000 [IU] | PEN_INJECTOR | Freq: Every day | SUBCUTANEOUS | 11 refills | Status: DC

## 2015-12-15 NOTE — Telephone Encounter (Signed)
Lantus entered as "fax" for [facility] program.  Thanks, Joanna Puffrystal S. Erlene Devita, MD [facility] Resident  12/15/2015, 8:11 AM

## 2015-12-20 ENCOUNTER — Telehealth: Payer: Self-pay | Admitting: Family Medicine

## 2015-12-20 NOTE — Telephone Encounter (Signed)
I received a call from Dr. Ledon SnareMcKnight, the patient's psychiatrist. He notes that she is currently on Lamictal 25 mg and not doing well with this. The patient had previously self discontinued therefore we will re-titrating up her medication. He wanted to inquire on whether we could go up by 50 mg every 2 weeks and attempt to get her back to 200 mg sooner. He notes that him or his office will be in contact with the patient weekly. I note that this would be okay, as long as his office notifies me with any side effects.  I have called and left a message with the map program.   He also inquired about something like Ritlan for the patient's narcolepsy. I note that I have no sleep studies indicating that she has narcolepsy on file with our clinic. Per his report, she has seen neurology in the past. He notes that he will have her follow-up with neurology for this.  [name] Puffrystal S. Child Campoy, MD [facility] Resident  12/20/2015, 10:10 AM

## 2016-01-05 ENCOUNTER — Encounter: Payer: Self-pay | Admitting: Family Medicine

## 2016-01-05 ENCOUNTER — Other Ambulatory Visit: Payer: Self-pay | Admitting: *Deleted

## 2016-01-05 ENCOUNTER — Ambulatory Visit (INDEPENDENT_AMBULATORY_CARE_PROVIDER_SITE_OTHER): Payer: Medicaid Other | Admitting: Family Medicine

## 2016-01-05 VITALS — BP 145/93 | HR 82 | Temp 97.9°F | Wt 311.0 lb

## 2016-01-05 DIAGNOSIS — J029 Acute pharyngitis, unspecified: Secondary | ICD-10-CM

## 2016-01-05 DIAGNOSIS — L03011 Cellulitis of right finger: Secondary | ICD-10-CM | POA: Diagnosis not present

## 2016-01-05 DIAGNOSIS — H109 Unspecified conjunctivitis: Secondary | ICD-10-CM | POA: Insufficient documentation

## 2016-01-05 DIAGNOSIS — H66011 Acute suppurative otitis media with spontaneous rupture of ear drum, right ear: Secondary | ICD-10-CM | POA: Diagnosis not present

## 2016-01-05 LAB — POCT RAPID STREP A (OFFICE): Rapid Strep A Screen: NEGATIVE

## 2016-01-05 MED ORDER — CLINDAMYCIN HCL 300 MG PO CAPS: 600.0000 mg | ORAL_CAPSULE | Freq: Three times a day (TID) | ORAL | 0 refills | Status: DC

## 2016-01-05 MED ORDER — INSULIN GLARGINE 100 UNIT/ML SOLOSTAR PEN: 30.0000 [IU] | PEN_INJECTOR | Freq: Every day | SUBCUTANEOUS | 11 refills | Status: DC

## 2016-01-05 NOTE — Telephone Encounter (Signed)
[facility] called and patient needs her lantus to be in the pen form not the vial.  Will resend with updated dosing from today's visit. Jazmin Hartsell,CMA

## 2016-01-05 NOTE — Assessment & Plan Note (Signed)
See plan for AOM above

## 2016-01-05 NOTE — Progress Notes (Signed)
   Subjective:   [name] [name] is a 40 y.o. female with a history of HTN, HLD, obesity, bipolar 1 disorder here for same day appointment for ear pain  Patient reports sore throat with white patches for 4 days. Also with rhinorrhea, postnasal drip, nonproductive cough. Over the last 2-3 days she has noticed worsening right ear pain and decrease in her hearing. Starting last night she started having green discharge from her right eye with lots of crusting and redness. She denies fevers, chills, shortness of breath, chest pain. She reports she is on Keflex for a MRSA cellulitis and abscess of her finger that she is supposed to take until January.  Review of Systems:  Per HPI.   Social History: never smoker  Objective:  BP (!) 145/93   Pulse 82   Temp 97.9 F (36.6 C) (Oral)   Wt (!) 311 lb (141.1 kg)   BMI 47.29 kg/m   Gen:  40 y.o. female, appears uncomfortable HEENT: NCAT, MMM, EOMI, PERRL, R conjunctiva erythematous, crusting drainage in corner, left TM clear, right TM with perforation and purulent drainage in canal, white patchy tonsillar exudate Neck: No LAD CV: RRR, no MRG Resp: Non-labored, CTAB, no wheezes noted Ext: WWP, no edema MSK: No obvious deformity, gait intact Neuro: Alert and oriented, speech normal    Assessment & Plan:     [name] [name] is a 40 y.o. female here for   Acute suppurative otitis media of right ear with spontaneous rupture of tympanic membrane AOM of R ear with TM perforation, also with bacterial conjunctivitis Strep swab negative Hearing normal Start Clindamycin 600mg  TID x10 days to cover AOM, conjunctivitis, and MRSA cellulitis Stop Keflex C diff precautions F/u in 3-5 days to ensure improvement Can consider switching to Doxy and augmentin if not improving Referral to ENT if hearing gets compromised - no indication at this time  Bacterial conjunctivitis of right eye See plan for AOM above  Cellulitis of finger of right hand Hand  surgery following Holding Keflex as above Treating with clindamycin Follow-up in 3-5 days to ensure continued improvement despite switching antibiotics    Erasmo DownerAngela M Nitesh Pitstick, MD MPH PGY-3,  [facility] 01/05/2016  9:54 AM  ;

## 2016-01-05 NOTE — Assessment & Plan Note (Signed)
Hand surgery following Holding Keflex as above Treating with clindamycin Follow-up in 3-5 days to ensure continued improvement despite switching antibiotics

## 2016-01-05 NOTE — Assessment & Plan Note (Signed)
AOM of R ear with TM perforation, also with bacterial conjunctivitis Strep swab negative Hearing normal Start Clindamycin 600mg  TID x10 days to cover AOM, conjunctivitis, and MRSA cellulitis Stop Keflex C diff precautions F/u in 3-5 days to ensure improvement Can consider switching to Doxy and augmentin if not improving Referral to ENT if hearing gets compromised - no indication at this time

## 2016-01-05 NOTE — Patient Instructions (Signed)
Eardrum Perforation  An eardrum perforation is a puncture or tear in the eardrum. This is also called a ruptured eardrum. The eardrum is a thin, round tissue inside of your ear that separates your ear canal from your middle ear. This is the tissue that detects sound and enables you to hear. An eardrum perforation can cause discomfort and hearing loss. In most cases, the eardrum will heal without treatment and with little or no permanent hearing loss.  CAUSES  An eardrum perforation can result from different causes, including:   Sudden pressure changes that happen in situations such as scuba diving or flying in an airplane.   Foreign objects in the ear.   Inserting a cotton-tipped swab or any blunt object into the ear.   Loud noise.   Trauma to the ear.   Attempting to remove an object from the ear.  SIGNS AND SYMPTOMS   Hearing loss.   Ear pain.   Ringing in the ear.   Discharge or bleeding from the ear.   Dizziness.   Vomiting.   Facial paralysis.  DIAGNOSIS   Your health care provider will examine your ear using a tool called an otoscope. This tool allows the health care provider to see into your ear to examine your eardrum. Various types of hearing tests may also be done.  TREATMENT   Typically, the eardrum will heal on its own within a few weeks. If your eardrum does not heal, your health care provider may recommend one of the following treatments:   A procedure to place a patch over your eardrum.   Surgery to repair your eardrum.  HOME CARE INSTRUCTIONS    Keep your ear dry. This will improve healing. Do not submerge your head under water until healing is complete. Do not swim or dive until your health care provider approves. While bathing or showering, protect your ear using one of these methods:    Using a waterproof earplug.    Covering a piece of cotton with petroleum jelly and placing it in your outer ear canal.   Take medicines only as directed by your health care provider.   Avoid  blowing your nose if possible. If you blow your nose, do it gently. Forceful blowing increases the pressure in your middle ear. This may cause further injury or may delay your healing.   Resume your normal activities after the perforation has healed. Your health care provider can tell you when this has occurred.   Talk to your health care provider before you fly on an airplane. Air travel is generally allowed with a perforated eardrum.   Keep all follow-up visits as directed by your health care provider. This is important.  SEEK MEDICAL CARE IF:   You have a fever.  SEEK IMMEDIATE MEDICAL CARE IF:   You have blood or pus coming from your ear.   You have dizziness or problems with balance.   You have nausea or vomiting.   You have increased pain.     This information is not intended to replace advice given to you by your health care provider. Make sure you discuss any questions you have with your health care provider.     Document Released: 02/18/2000 Document Revised: 03/13/2014 Document Reviewed: 09/29/2013  Elsevier Interactive Patient Education 2016 Elsevier Inc[employer]    Otitis Media, Adult  Otitis media is redness, soreness, and inflammation of the middle ear. Otitis media may be caused by allergies or, most commonly, by infection. Often it occurs   as a complication of the common cold.  SIGNS AND SYMPTOMS  Symptoms of otitis media may include:   Earache.   Fever.   Ringing in your ear.   Headache.   Leakage of fluid from the ear.  DIAGNOSIS  To diagnose otitis media, your health care provider will examine your ear with an otoscope. This is an instrument that allows your health care provider to see into your ear in order to examine your eardrum. Your health care provider also will ask you questions about your symptoms.  TREATMENT   Typically, otitis media resolves on its own within 3-5 days. Your health care provider may prescribe medicine to ease your symptoms of pain. If otitis media does not resolve  within 5 days or is recurrent, your health care provider may prescribe antibiotic medicines if he or she suspects that a bacterial infection is the cause.  HOME CARE INSTRUCTIONS    If you were prescribed an antibiotic medicine, finish it all even if you start to feel better.   Take medicines only as directed by your health care provider.   Keep all follow-up visits as directed by your health care provider.  SEEK MEDICAL CARE IF:   You have otitis media only in one ear, or bleeding from your nose, or both.   You notice a lump on your neck.   You are not getting better in 3-5 days.   You feel worse instead of better.  SEEK IMMEDIATE MEDICAL CARE IF:    You have pain that is not controlled with medicine.   You have swelling, redness, or pain around your ear or stiffness in your neck.   You notice that part of your face is paralyzed.   You notice that the bone behind your ear (mastoid) is tender when you touch it.  MAKE SURE YOU:    Understand these instructions.   Will watch your condition.   Will get help right away if you are not doing well or get worse.     This information is not intended to replace advice given to you by your health care provider. Make sure you discuss any questions you have with your health care provider.     Document Released: 11/26/2003 Document Revised: 03/13/2014 Document Reviewed: 09/17/2012  Elsevier Interactive Patient Education 2016 Elsevier Inc.

## 2016-01-06 ENCOUNTER — Telehealth: Payer: Self-pay | Admitting: Family Medicine

## 2016-01-06 NOTE — Telephone Encounter (Signed)
Patient called after hours line. Relayed Dr. Penelope CoopBacigalupo's message and reassured patient. Her main concern is that she is going out of town tomorrow and was expecting to feel better already. Again discussed expectations with patient and when she should seek further treatment.   [name] AdaJazma Phelps, DO 01/06/2016, 5:50 PM PGY-3, [facility]

## 2016-01-06 NOTE — Telephone Encounter (Signed)
Pt is calling because she was seen yesterday by Dr. Beryle FlockBacigalupo and given some medication Clindamycin and this is not helping and she would like to speak to the nurse about this and if she should be prescribed some eye drops. Please call to discuss. jw

## 2016-01-06 NOTE — Telephone Encounter (Signed)
Attempted to call patient back. No answer. Left voicemail asking patient call back to clinic.  When patient calls back, please inform her that her symptoms will not get better on 1 day of antibiotics. She will continue to have drainage and itchy eyes for a few days. The only reason to switch antibiotics would be for worsening of her symptoms including spreading redness around her eye, or worsening infection on her finger.  If she notices the symptoms are worsening over the weekend, she can call the after-hours line. There is also no indication for eyedrops at this time as the pill will cover her infection as well.  Thanks  Erasmo DownerAngela M Molli Gethers, MD, MPH PGY-3,  [facility] 01/06/2016 3:51 PM

## 2016-01-06 NOTE — Telephone Encounter (Signed)
Returned patient's call. Saw Dr. Beryle FlockBacigalupo yesterday and placed on clindamycin tid. Patient has taken 4 doses and states she feels the same except eyes are more itchy today. Continues with "green pus" coming from right eye. Denies fever, cough, pain. Taking ibuprofen 400 mg every 4 hours and cetirizine daily. Patient will be going out of town tomorrow and wonders if she should be on different antibiotic. Please advise.  [name] FeilL. Henrry Feil, RN, BSN

## 2016-01-07 ENCOUNTER — Other Ambulatory Visit: Payer: Self-pay | Admitting: *Deleted

## 2016-01-10 ENCOUNTER — Ambulatory Visit (INDEPENDENT_AMBULATORY_CARE_PROVIDER_SITE_OTHER): Payer: Medicaid Other | Admitting: Family Medicine

## 2016-01-10 ENCOUNTER — Encounter: Payer: Self-pay | Admitting: Family Medicine

## 2016-01-10 VITALS — BP 138/88 | HR 88 | Temp 98.4°F | Wt 309.0 lb

## 2016-01-10 DIAGNOSIS — R400 Somnolence: Secondary | ICD-10-CM | POA: Diagnosis not present

## 2016-01-10 DIAGNOSIS — H66011 Acute suppurative otitis media with spontaneous rupture of ear drum, right ear: Secondary | ICD-10-CM

## 2016-01-10 MED ORDER — VENLAFAXINE HCL ER 150 MG PO CP24: 150.0000 mg | ORAL_CAPSULE | Freq: Every day | ORAL | 3 refills | Status: DC

## 2016-01-10 MED ORDER — OLOPATADINE HCL 0.2 % OP SOLN: 1.0000 [drp] | Freq: Every day | OPHTHALMIC | 0 refills | Status: DC

## 2016-01-10 NOTE — Patient Instructions (Signed)
It was nice to see you I have prescribed an eyedrop to help with the itching. Your right eye actually looks like it is improving compared to when he saw Dr. Leonard SchwartzB. Given your decreased hearing in the right ear, I have referred to to an ear nose and throat specialist Continue with the clindamycin, when you've completed this antibiotic, switch back to the dose of Keflex at your previously on.

## 2016-01-10 NOTE — Assessment & Plan Note (Addendum)
Hearing assessment decreased compared to her previous visit. She is also high risk as she has a history of type 2 diabetes. -Continue clindamycin 3 times a day, as it seems to be doing well for her otitis media, conjunctivitis, and MRSA cellulitis. -Advised the patient to restart her Keflex once she has completed her clindamycin course. -Referral to ENT due to decreased hearing noted on exam. - Patient seems to have decreased erythema and drainage on exam compared to previous examination, but states that her right spectral conjunctivae this is stable. Her current complaint is pruritus, but no pain. Will prescribe Pataday to help with pruritus, as I do not feel like this is bacterial infection causing her current symptoms. -Discussed return precautions with the patient.

## 2016-01-10 NOTE — Progress Notes (Signed)
Subjective: CC: f/u ear pain HPI: Patient is a 40 y.o. female with a past medical history of DM, cellulitis, HTN, HLD, bipolar 1 DO presenting to clinic today for a same day appt to f/u on acute suppurative otitis media with perforation of the R TM.  She was last seen on 01/05/16 and noted to have OM with perforation in addition to bacterial conjunctivitis. Her hearing was normal. She was also being treated with a long course of Keflex for MRSA cellulitis on her finger. She was transitioned from Keflex to Clindamycin.  Today she states that since that time she still has pain in R ear pain, decreased hearing, drainage of the R eye, and sore throat. All these are stable. Discharge from R eye is still green. She notes it mostly in the AM. Note eye is very pruritic. She can't wear contacts in R eye and notes some blurred vision b/c of this. No erythema around the eye. Intermittent productive cough.  Having rhinorrhea and post nasal drip as well.   No fevers or chills. Infection on R middle finger is "no issue" currently.   She said that the ear stopped draining. She's doing Ibuprofen as well as the clindamycin.    Social History: never smoker   ROS: All other systems reviewed and are negative.  Past Medical History Patient Active Problem List   Diagnosis Date Noted  . Acute suppurative otitis media of right ear with spontaneous rupture of tympanic membrane 01/05/2016  . Bacterial conjunctivitis of right eye 01/05/2016  . Diabetes mellitus with hyperglycemia (HCC) 11/25/2015  . Cellulitis of right hand 11/23/2015  . Pain   . Cellulitis 09/14/2015  . Abscess of finger 09/14/2015  . Cellulitis of finger of right hand 09/14/2015  . Type 2 diabetes mellitus without complication, without long-term current use of insulin (HCC)   . Abdominal pain, left lower quadrant 07/13/2014  . Groin pain, chronic, right 07/13/2014  . Lung nodule seen on imaging study 09/24/2013  . Chest pain 09/24/2013    . Diabetes mellitus (HCC) 07/11/2012  . Pollen allergies 07/11/2012  . 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 Meralgia paresthetica of left side 08/18/2010  . Fatigue 04/20/2010  . INTERSTITIAL CYSTITIS 07/22/2007  . MENOPAUSE, SURGICAL 04/24/2007  . HIATAL HERNIA 11/20/2006  . Hypercholesterolemia 05/03/2006  . OBESITY, NOS 05/03/2006  . ANEMIA, IRON DEFICIENCY, UNSPEC. 05/03/2006  . Bipolar I disorder (HCC) 05/03/2006  . ANXIETY 05/03/2006  . MIGRAINE, UNSPEC., W/O INTRACTABLE MIGRAINE 05/03/2006  . HYPERTENSION, BENIGN SYSTEMIC 05/03/2006  . PEPTIC ULCER DIS., UNSPEC. W/O OBSTRUCTION 05/03/2006    Medications- reviewed and updated  Objective: Office vital signs reviewed. BP 138/88   Pulse 88   Temp 98.4 F (36.9 C) (Oral)   Wt (!) 309 lb (140.2 kg)   BMI 46.98 kg/m    Physical Examination:  General: Awake, alert, well- nourished, NAD ENMT: Left  TMs intact, normal light reflex, no erythema, no bulging. Right TM with perforation, no drainage noted.  Nasal turbinates moist, bogg. MMM, Oropharynx clear without erythema or tonsillar exudate/hypertrophy Eyes: Conjunctiva non-injected. No drainage.  PERRL. No pain with movement.  Cardio: RRR, no m/r/g noted.  Pulm: No increased WOB.  CTAB, without wheezes, rhonchi or crackles noted.  Skin: dry, intact, no rashes or lesions    Hearing Screening   Method: Audiometry   125Hz  250Hz  500Hz  1000Hz  2000Hz  3000Hz  4000Hz  6000Hz  8000Hz   Right ear:   Fail Fail Fail Pass Pass    Left ear:  Pass Pass Pass Pass Pass       Assessment/Plan: Acute suppurative otitis media of right ear with spontaneous rupture of tympanic membrane Hearing assessment decreased compared to her previous visit. She is also high risk as she has a history of type 2 diabetes. -Continue clindamycin 3 times a day, as it seems to be doing well for her otitis media, conjunctivitis, and MRSA cellulitis. -Advised the patient to restart her Keflex once she has completed  her clindamycin course. -Referral to ENT due to decreased hearing noted on exam. - Patient seems to have decreased erythema and drainage on exam compared to previous examination, but states that her right spectral conjunctivae this is stable. Her current complaint is pruritus, but no pain. Will prescribe Pataday to help with pruritus, as I do not feel like this is bacterial infection causing her current symptoms. -Discussed return precautions with the patient.  The patient's psychiatrist, Dr. Ledon SnareMcKnight would like the patient evaluated for narcolepsy for daytime sleepiness. Will refer for sleep study as her last one was in 2012.   Orders Placed This Encounter  Procedures  . Ambulatory referral to ENT    Referral Priority:   Routine    Referral Type:   Consultation    Referral Reason:   Specialty Services Required    Requested Specialty:   Otolaryngology    Number of Visits Requested:   1  . Ambulatory referral to Sleep Studies    Referral Priority:   Routine    Referral Type:   Consultation    Referral Reason:   Specialty Services Required    Number of Visits Requested:   1    Meds ordered this encounter  Medications  . Olopatadine HCl 0.2 % SOLN    Sig: Apply 1 drop to eye daily. In right eye.    Dispense:  2.5 mL    Refill:  0    Joanna Puffrystal S. Bryden Darden PGY-3, [facility]

## 2016-01-12 ENCOUNTER — Telehealth: Payer: Self-pay | Admitting: Family Medicine

## 2016-01-12 NOTE — Telephone Encounter (Signed)
Pt called and would like us to send her referral for a sleep study to another location. She doesn't want to wait that long and [place]. Please call when this is done so that she can get seen sooner. jw

## 2016-01-13 NOTE — Telephone Encounter (Signed)
Will forward to Tia who takes care of referrals.   Thanks, Joanna Puffrystal S. Dorsey, MD [facility] Resident  01/13/2016, 8:11 AM

## 2016-01-19 ENCOUNTER — Telehealth: Payer: Self-pay | Admitting: *Deleted

## 2016-01-19 NOTE — Telephone Encounter (Signed)
Received a call [facility] requesting a refill on Metformin.  Medication is not listed on current med list.  Please advise.  Clovis PuMartin, Tamika L, RN

## 2016-01-20 MED ORDER — METFORMIN HCL ER 500 MG PO TB24: 1000.0000 mg | ORAL_TABLET | Freq: Every day | ORAL | 1 refills | Status: DC

## 2016-01-20 NOTE — Telephone Encounter (Signed)
Refilled metformin- XR 1000mg  qHS.  [name] Puffrystal S. Belanna Manring, MD [facility] Resident  01/20/2016, 11:54 AM

## 2016-01-26 ENCOUNTER — Telehealth: Payer: Self-pay | Admitting: Family Medicine

## 2016-01-26 DIAGNOSIS — D649 Anemia, unspecified: Secondary | ICD-10-CM

## 2016-01-26 NOTE — Telephone Encounter (Signed)
Pt called again about getting some labs done

## 2016-01-26 NOTE — Telephone Encounter (Signed)
Pt has been exhausted for over a week. She would like to have her iron level and vitamin d check. Could dr Leonides Schanzdorsey put in a lab request for that? Please advise

## 2016-01-31 NOTE — Telephone Encounter (Signed)
Returned call. Noted vitamin D def often is not the culprit for fatigue. Pt scheduled to see neurology concerning significant daytime sleepiness which is what is so bothersome now. She has a h/o hysterectomy with what sounds like ovary removal in the past as she states she has low estrogen that has caused her to have low vitamin D in the past.  Hemoglobin has been low in the past.  Will check CBC, iron panel, and vitamin D level.  CSD

## 2016-02-08 ENCOUNTER — Other Ambulatory Visit: Payer: Medicaid Other

## 2016-02-08 DIAGNOSIS — D649 Anemia, unspecified: Secondary | ICD-10-CM

## 2016-02-08 LAB — CBC
HEMATOCRIT: 36.9 % (ref 35.0–45.0)
Hemoglobin: 12 g/dL (ref 11.7–15.5)
MCH: 28.2 pg (ref 27.0–33.0)
MCHC: 32.5 g/dL (ref 32.0–36.0)
MCV: 86.6 fL (ref 80.0–100.0)
MPV: 8.3 fL (ref 7.5–12.5)
Platelets: 227 10*3/uL (ref 140–400)
RBC: 4.26 MIL/uL (ref 3.80–5.10)
RDW: 14.4 % (ref 11.0–15.0)
WBC: 6.8 10*3/uL (ref 3.8–10.8)

## 2016-02-08 LAB — FERRITIN: Ferritin: 17 ng/mL (ref 10–232)

## 2016-02-09 ENCOUNTER — Encounter: Payer: Self-pay | Admitting: Neurology

## 2016-02-09 ENCOUNTER — Ambulatory Visit (INDEPENDENT_AMBULATORY_CARE_PROVIDER_SITE_OTHER): Payer: Medicaid Other | Admitting: Neurology

## 2016-02-09 VITALS — BP 112/58 | HR 78 | Resp 18 | Ht 68.0 in | Wt 309.0 lb

## 2016-02-09 DIAGNOSIS — E669 Obesity, unspecified: Secondary | ICD-10-CM | POA: Insufficient documentation

## 2016-02-09 DIAGNOSIS — G4719 Other hypersomnia: Secondary | ICD-10-CM

## 2016-02-09 DIAGNOSIS — R351 Nocturia: Secondary | ICD-10-CM | POA: Diagnosis not present

## 2016-02-09 DIAGNOSIS — Z9989 Dependence on other enabling machines and devices: Secondary | ICD-10-CM | POA: Diagnosis not present

## 2016-02-09 DIAGNOSIS — G4733 Obstructive sleep apnea (adult) (pediatric): Secondary | ICD-10-CM | POA: Diagnosis not present

## 2016-02-09 DIAGNOSIS — R6889 Other general symptoms and signs: Secondary | ICD-10-CM | POA: Insufficient documentation

## 2016-02-09 DIAGNOSIS — F518 Other sleep disorders not due to a substance or known physiological condition: Secondary | ICD-10-CM

## 2016-02-09 DIAGNOSIS — E662 Morbid (severe) obesity with alveolar hypoventilation: Secondary | ICD-10-CM

## 2016-02-09 LAB — IRON AND TIBC
%SAT: 15 % (ref 11–50)
Iron: 50 ug/dL (ref 40–190)
TIBC: 340 ug/dL (ref 250–450)
UIBC: 290 ug/dL (ref 125–400)

## 2016-02-09 LAB — VITAMIN D 25 HYDROXY (VIT D DEFICIENCY, FRACTURES): VIT D 25 HYDROXY: 16 ng/mL — AB (ref 30–100)

## 2016-02-09 MED ORDER — MODAFINIL 200 MG PO TABS
200.0000 mg | ORAL_TABLET | Freq: Every day | ORAL | 5 refills | Status: DC
Start: 2016-02-09 — End: 2016-08-08

## 2016-02-09 NOTE — Patient Instructions (Signed)
Hypersomnia Introduction Hypersomnia is when you feel extremely tired during the day even though you're getting plenty of sleep at night. You may need to take naps during the day, and you may also be extremely difficult to wake up when you are sleeping. What are the causes? The cause of your hypersomnia may not be known. Hypersomnia may be caused by:  Medicines.  Sleep disorders, such as narcolepsy.  Trauma or injury to your head or nervous system.  Using drugs or alcohol.  Tumors.  Medical conditions, such as depression or hypothyroidism.  Genetics. What are the signs or symptoms? The main symptoms of hypersomnia include:  Feeling extremely tired throughout the day.  Being very difficult to wake up.  Sleeping for longer and longer periods.  Taking naps throughout the day. Other symptoms may include:  Feeling:  Restless.  Annoyed.  Anxious.  Low energy.  Having difficulty:  Remembering.  Speaking.  Thinking.  Losing your appetite.  Experiencing hallucinations. How is this diagnosed? Hypersomnia may be diagnosed by:  Medical history and physical exam. This will include a sleep history.  Completing sleep logs.  Tests may also be done, such as:  Polysomnography.  Multiple sleep latency test (MSLT). How is this treated? There is no cure for hypersomnia, but treatment can be very effective in helping manage the condition. Treatment may include:  Lifestyle and sleeping strategies to help cope with the condition.  Stimulant medicines.  Treating any underlying causes of hypersomnia. Follow these instructions at home:  Take medicines only as directed by your health care provider.  Schedule short naps for when you feel sleepiest during the day. Tell your employer or teachers that you have hypersomnia. You may be able to adjust your schedule to include time for naps.  Avoid drinking alcohol or caffeinated beverages.  Do not eat a heavy meal before  bedtime. Eat at about the same times every day.  Do not drive or operate heavy machinery if you are sleepy.  Do not swim or go out on the water without a life jacket.  If possible, adjust your schedule so that you do not have to work or be active at night.  Keep all follow-up visits as directed by your health care provider. This is important. Contact a health care provider if:  You have new symptoms.  Your symptoms get worse. Get help right away if: You have serious thoughts of hurting yourself or someone else. This information is not intended to replace advice given to you by your health care provider. Make sure you discuss any questions you have with your health care provider. Document Released: 02/10/2002 Document Revised: 07/29/2015 Document Reviewed: 09/25/2013  2017 Elsevier  

## 2016-02-09 NOTE — Progress Notes (Signed)
SLEEP MEDICINE CLINIC   Provider:  Melvyn Novas, M D  Referring Provider: Joanna Puff, MD Primary Care Physician:  Rodrigo Ran, MD  Chief Complaint  Patient presents with  . Sleepiness    Rm 11. Sleep Consult. Patient currently uses CPAP, snoring, witnessed apnea, wakes up feeling tired, daytime fatigue, takes naps. DME: [facility]    HPI:  [name] [name] is a 40 y.o. female , seen here as a referral from Dr. Leonides Schanz for a sleep consultation,  [name] [name] was diagnosed with obstructive sleep apnea in [facility] approximately 7 years ago and she has been on CPAP ever since. Also she has continued to use CPAP compliantly she has developed hypersomnia and she is sleepier now than she has been years before treatment. She is falling asleep uncontrollably. She has a history of morbid obesity, diabetes, depression, carpal tunnel syndrome, frequent sinusitis, allergic rhinitis and stuffy nose -and she suffered from a staph infection in Summer of this year and was treated with intravenous antibiotics for prolonged course (MRSA , three recurrences) . Hospitalized 3 times. I do not have access to her original study but to a compliance download. The patient is using a Estate agent. Her 30 day compliance shows 70% compliance but only 57% with over 4 hours of nightly use. Average usage for days on CPAP of 7 hours and 17 minutes but there were 9 days without using CPAP. This month was unusual because she had struggled with a sinus infection. The mean pressure on her CPAP machine is 13.8 cm water, average pressure for 90% of user time is 15.8 cm water and the residual average AHI is 3.3 per hour.  She gained weight since her initial sleep study, now reached super-obesity.   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 Her bedtime is usually at about 7 PM plus minus one hour, she definitely goes to bed early. She states she falls asleep promptly she stays asleep. She does not feel that her sleep is fragmented. She will have up to 3 nocturia breaks at night. Her bedroom is quiet, cool and dark. She prefers to sleep on either side, and uses multiple pillows. She does not report heartburn or gastric reflux. Her family reports that she still snores without using CPAP. She has struggled with frequent upper respiratory infections and allergic obstructions of the nasal airway. She reports vivid dreams especially when she had a stressful day. She sometimes wakes up with a sensation that she cannot breathe. She does not report sleep paralysis, no cataplexy, but reports  dream intrusion and hypnagogic, hypnopompic hallucinations. She reports such visit life like dreams that it is hard to distinguish reality from dream. She also reports that she can be almost immediately dreaming once in bed and asleep. She wakes up spontaneously by 5 AM  (but has a back up alarm clock), and walks for exercise form 5.30 to 6.30 AM , managing to walk 1.2 miles.    Sleep medical history and family sleep history:  Father has OSA, son and daughter have OSA.    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  In social environments she may drink alcohol but not regularly, she is not a tobacco user, caffeine use is rare.   Review of Systems: Out of a complete 14 system review, the patient complains of only the following symptoms, and all other reviewed systems are negative.  weight gain,  Snoring, OSA on CPAP, hypersomnia. Fatigue.  How likely are you to doze in the following situations: 0 = not likely, 1 = slight chance, 2 = moderate chance, 3 = high chance  Sitting  and Reading?2 Watching Television?3 Sitting inactive in a public place (theater or meeting)?3 As a passenger in a car for an hour without a break?3  Lying down in the afternoon when circumstances permit?2 Sitting and talking to someone?0 Sitting quietly after lunch without alcohol?2 In a car, while stopped for a few minutes in traffic?2   Total =17   , Fatigue severity score 46  , depression score 3/15   Social History   Social History  . Marital status: Single    Spouse name: N/A  . Number of children: 3  . Years of education: N/A   Occupational History  . Not on file.   Social History Main Topics  . Smoking status: Never Smoker  . Smokeless tobacco: Never Used  . Alcohol use 0.0 oz/week     Comment: occ  . Drug use: No  . Sexual activity: Not on file   Other Topics Concern  . Not on file   Social History Narrative  . No narrative on file    Family History  Problem Relation Age of Onset  . Hypertension Mother   . Diabetes Mellitus II Mother   . Congestive Heart Failure Father   . Hypertension Father   . Diabetes Mellitus II Father     Past Medical History:  Diagnosis Date  . Allergy   . Anemia   . Bipolar 1 disorder [facility](HCC)    Sees Dr. Ledon SnareMcKnight, psychology,  [phone]  . Carpal tunnel syndrome   . Depression   . Diabetes mellitus without complication (HCC)   . Family history of adverse reaction to anesthesia    mother & son have difficulty waking  . Fatigue   . Sleep apnea    uses cpap    Past Surgical History:  Procedure Laterality Date  . ABDOMINAL HYSTERECTOMY    . I&D EXTREMITY Right 09/14/2015   Procedure: IRRIGATION AND DEBRIDEMENT RIGHT MIDDLE FINGER;  Surgeon: Dominica SeverinWilliam Gramig, MD;  Location: [facility] OR;  Service: Orthopedics;  Laterality: Right;  . TUBAL LIGATION      Current Outpatient Prescriptions  Medication Sig Dispense Refill  . cetirizine (ZYRTEC) 10 MG tablet Take 10 mg by mouth daily.     . clindamycin (CLEOCIN) 300 MG capsule  Take 2 capsules (600 mg total) by mouth 3 (three) times daily. 60 capsule 0  . Insulin Glargine (LANTUS SOLOSTAR) 100 UNIT/ML Solostar Pen Inject 30 Units into the skin daily. 15 mL 11  . lamoTRIgine (LAMICTAL) 25 MG tablet Take 25mg  for 2 weeks, then 50mg  for 2 weeks, then 100mg  for 1 week, then resume 200mg  daily 240 tablet 0  . metFORMIN (GLUCOPHAGE XR) 500 MG 24 hr tablet Take 2 tablets (1,000 mg total) by mouth at bedtime. 60 tablet 1  . venlafaxine XR (EFFEXOR-XR) 150 MG 24 hr capsule Take 1 capsule (150 mg total) by mouth daily with breakfast. 30 capsule 3   No current facility-administered medications for this visit.     Allergies as  of 02/09/2016 - Review Complete 02/09/2016  Allergen Reaction Noted  . Hydromorphone hcl Anaphylaxis and Hives 11/12/2008  . Meperidine hcl Anaphylaxis and Hives 11/12/2008  . Morphine Anaphylaxis and Hives 11/12/2008  . Lisinopril Cough 07/28/2005  . Sumatriptan Other (See Comments) 07/01/2007  . Tape Rash 11/23/2015  . Zolmitriptan Other (See Comments) 07/01/2007    Vitals: BP (!) 112/58   Pulse 78   Resp 18   Ht 5\' 8"  (1.727 m)   Wt (!) 309 lb (140.2 kg)   BMI 46.98 kg/m  Last Weight:  Wt Readings from Last 1 Encounters:  02/09/16 (!) 309 lb (140.2 kg)   ZOX:WRUEBMI:Body mass index is 46.98 kg/m.     Last Height:   Ht Readings from Last 1 Encounters:  02/09/16 5\' 8"  (1.727 m)    Physical exam:  General: The patient is awake, alert and appears not in acute distress. The patient is well groomed. Head: Normocephalic, atraumatic. Neck is supple. Mallampati 4,  neck circumference: 17. Nasal airflow patent , TMJ click isevident . Bruxism marks. Retrognathia is seen.  Cardiovascular:  Regular rate and rhythm , without  murmurs or carotid bruit, and without distended neck veins. Respiratory: Lungs are clear to auscultation. Skin:  Without evidence of edema, or rash Trunk: BMI is 47  Neurologic exam : The patient is awake and alert, oriented to  place and time.     Attention span & concentration ability appears normal.  Speech is fluent,  without dysarthria, dysphonia or aphasia.  Mood and affect are appropriate.  Cranial nerves: Pupils are equal and briskly reactive to light. Funduscopic exam without  evidence of pallor or edema. Extraocular movements  in vertical and horizontal planes intact and without nystagmus. Visual fields by finger perimetry are intact.Hearing to finger rub intact.  Facial sensation intact to fine touch. Facial motor strength is symmetric and tongue and uvula move midline. Shoulder shrug was symmetrical.   Motor exam:  Normal tone, muscle bulk and symmetric strength in all extremities. She has sometimes trouble opening a jar lid, having suffered from carpal tunnel. This has affected grip strength and pinch strengths.  Sensory:  Fine touch, pinprick and vibration were tested in all extremities. Proprioception tested in the upper extremities was normal.  Coordination: Finger-to-nose maneuver  normal without evidence of ataxia, dysmetria or tremor.  Gait and station: Patient walks without assistive device and is able unassisted to climb up to the exam table. Strength within normal limits.  Stance is stable and normal.   Deep tendon reflexes: in the  upper and lower extremities are symmetric and intact. Babinski maneuver response is downgoing.  The patient was advised of the nature of the diagnosed sleep disorder , the treatment options and risks for general a health and wellness arising from not treating the condition.  I spent more than 40  minutes of face to face time with the patient. Greater than 50% of time was spent in counseling and coordination of care. We have discussed the diagnosis and differential and I answered the patient's questions.     Assessment:  After physical and neurologic examination, review of laboratory studies,  Personal review of imaging studies, reports of other /same  Imaging studies  ,  Results of polysomnography/ neurophysiology testing and pre-existing records as far as provided in visit., my assessment is   1) I have no doubt that [name] [name] still suffers from obstructive sleep apnea and that her CPAP has been reducing her AHI significantly. She has  not been able to comply to a higher degree with CPAP use due to upper airway infections. She also had a bad year with recurrent infections and prolonged and repeated antibiotic treatments. She is using Zyrtec daily to prevent allergic rhinitis.  Given the current degree of hypersomnia in a patient that usually sleeps 8 hours or more each night and does not feel that her sleep is extremely fragmented, I am concerned that she may have another underlying sleep disorder aside from obstructive sleep apnea. She may also have obesity hypoventilation given her high body mass index, high-grade Mallampati, and more than average neck circumference. Diabetes can also contribute to fatigue. I have also entertained the possibility of the presence of narcolepsy without cataplexy.  For this reason I will invite [name] [name] for a sleep study, split night ultrasonography to first see the baseline degree of apnea she now has, her body mass index has changed significantly since she was initially diagnosed. She was heavier at the time when she had her last sleep study 6 or 7 years ago, she may have lost about 30 pounds or more. In addition we will titrate her to CPAP .  At a later time, I may follow with a PSG/ and  M SLT the following day. The M SLT is the gold standard test for narcolepsy. To have a valid test the patient cannot be on REM sleep suppressant medications, and has to prove that she slept at least 360 minutes the preceding night.  I have to wean the patient off effexor and keep her on lamictal in order for the test to be valid.    Plan:  Treatment plan and additional workup : At this time I will order a split night polysomnography, I  need the patient's baseline apnea at this time, I will also want to see which pressure she truly needs to have the apnea under control. If the pressure during her titration study doesn't vary very much from her current pressure settings at home, I will invite her to have a narcolepsy evaluation. Effexor is very difficult to wean off as a sleep study and split study will be performed while she is on medication. I will also order a blood test for narcolepsy.  Modafinil for hypersomnia in treatment compliant sleep apnea patients.    [name] Mylar Irbin Fines MD  02/09/2016   CC: Joanna Puff, Md [address] [place], [place] [address]

## 2016-02-17 ENCOUNTER — Ambulatory Visit (INDEPENDENT_AMBULATORY_CARE_PROVIDER_SITE_OTHER): Payer: Medicaid Other | Admitting: Neurology

## 2016-02-17 DIAGNOSIS — E662 Morbid (severe) obesity with alveolar hypoventilation: Secondary | ICD-10-CM

## 2016-02-17 DIAGNOSIS — E669 Obesity, unspecified: Secondary | ICD-10-CM

## 2016-02-17 DIAGNOSIS — Z9989 Dependence on other enabling machines and devices: Secondary | ICD-10-CM

## 2016-02-17 DIAGNOSIS — G4733 Obstructive sleep apnea (adult) (pediatric): Secondary | ICD-10-CM | POA: Diagnosis not present

## 2016-02-17 DIAGNOSIS — R6889 Other general symptoms and signs: Secondary | ICD-10-CM

## 2016-02-17 DIAGNOSIS — G4719 Other hypersomnia: Secondary | ICD-10-CM

## 2016-02-21 ENCOUNTER — Telehealth: Payer: Self-pay | Admitting: Family Medicine

## 2016-02-21 MED ORDER — CETIRIZINE HCL 10 MG PO TABS: 10.0000 mg | ORAL_TABLET | Freq: Every day | ORAL | 0 refills | Status: DC

## 2016-02-21 MED ORDER — VITAMIN D (ERGOCALCIFEROL) 1.25 MG (50000 UNIT) PO CAPS: 50000.0000 [IU] | ORAL_CAPSULE | ORAL | 0 refills | Status: DC

## 2016-02-21 NOTE — Telephone Encounter (Signed)
Called the patient to inform her of low vitamin D results. Will supplement with vitamin D weekly x 8 weeks, then repeat labwork. Patient to f/u with me in 8 weeks.  Joanna Puffrystal S. Levern Kalka, MD [facility] Resident  02/21/2016, 8:56 AM

## 2016-02-22 ENCOUNTER — Telehealth: Payer: Self-pay | Admitting: Neurology

## 2016-02-22 NOTE — Telephone Encounter (Signed)
LM for patient that we have not received the results yet but I will call back as soon as I do. Left call back # for any further questions.

## 2016-02-22 NOTE — Telephone Encounter (Signed)
Pt called request sleep study results.  Pt has questions and is requesting RN to touch base with her today or tomorrow.

## 2016-02-29 NOTE — Procedures (Signed)
PATIENT'S NAME:  [name] [name] DOB:      [birth year]      MR#:    [id number]     DATE OF RECORDING: 02/17/2016 REFERRING M.D.:  Kathrine Cords, MD Study Performed:   Baseline Polysomnogram HISTORY:  [name] [name] is a female, 40 years old and  seen after referral from Dr. Lorenso Courier for a sleep consultation. She was diagnosed with obstructive sleep apnea in [place], [place] approximately 7 years ago and she has been on CPAP ever since.  Also she has continued to use CPAP compliantly she has developed hypersomnia and she is sleepier now than she has been years before treatment. She is falling asleep uncontrollably.  She has super- obesity, diabetes, depression, carpal tunnel syndrome, frequent sinusitis, allergic rhinitis.  The patient endorsed the Epworth Sleepiness Scale at 17/ 24 points.   The patient's weight 309 pounds with a height of 68 (inches), resulting in a BMI of 46.8 kg/m2. The patient's neck circumference measured 17 inches.  CURRENT MEDICATIONS: Zyrtec, Cleocin, Lantus, Lamictal, Glucophage, Effexor-XR   PROCEDURE:  This is a multichannel digital polysomnogram utilizing the Somnostar 11.2 system.  Electrodes and sensors were applied and monitored per AASM Specifications.   EEG, EOG, Chin and Limb EMG, were sampled at 200 Hz.  ECG, Snore and Nasal Pressure, Thermal Airflow, Respiratory Effort, CPAP Flow and Pressure, Oximetry was sampled at 50 Hz. Digital video and audio were recorded.      BASELINE STUDY  Lights Out was at 21:31 and Lights On at 05:06.  Total recording time (TRT) was 455.5 minutes, with a total sleep time (TST) of 428.5 minutes.   The patient's sleep latency was 9 minutes.  REM latency was 286 minutes. The sleep efficiency was 94.1 %.     SLEEP ARCHITECTURE: WASO (Wake after sleep onset) was 18 minutes.  There were 7 minutes in Stage N1, 342.5 minutes Stage N2, 35.5 minutes Stage N3 and 43.5 minutes in Stage REM.  The percentage of Stage N1 was 1.6%, Stage N2  was 79.9%, Stage N3 was 8.3% and Stage R (REM sleep) was 10.2%.   The arousals were noted as: 140 were spontaneous, 0 were associated with PLMs, 13 were associated with respiratory events.  RESPIRATORY ANALYSIS:  There were a total of 14 respiratory events:  4 obstructive apneas, 0 central apneas and 0 mixed apneas with a total of 4 apneas and an apnea index (AI) of .6 /hour. There were 10 hypopneas with a hypopnea index of 1.4 /hour. The patient also had 0 respiratory event related arousals (RERAs).      The total APNEA/HYPOPNEA INDEX (AHI) was 2./hour and the total RESPIRATORY DISTURBANCE INDEX was 2. /hour.  3 events occurred in REM sleep and 14 events in NREM. The REM AHI was 4.1 /hour, versus a non-REM AHI of 1.7. The patient spent 150 minutes of total sleep time in the supine position and 279 minutes in non-supine. The supine AHI was 1.2 versus a non-supine AHI of 2.6.  OXYGEN SATURATION & C02:  The Wake baseline 02 saturation was 98%, with the lowest being 91%. Time spent below 89% saturation equaled 0 minutes.   The patient had a total of 0 Periodic Limb Movements.  Audio and video analysis did not show any abnormal or unusual movements, behaviors, phonations or vocalizations.   The patient took one bathroom break. Snoring was noted. EKG was in keeping with normal sinus rhythm (NSR). Post-study, the patient indicated that sleep was not the same as  usual. Her bedtime at home is earlier and she felt she had some nightmares.    IMPRESSION: This patient needs no longer CPAP, her apnea is mild, her sleep sustained and she has neither PLMs, nor hypoxemia, nor cardiac arrhythmias.   RECOMMENDATIONS:  The very high degree of daytime sleepiness is not explained by the benign findings of the PSG. I recommend a follow up HLA narcolepsy test. A MSLT would be non-valid, due to medication treatment for depression   1. Advise to lose weight, diet and exercise if not contraindicated (BMI  47). 2. Advise patient to avoid driving or operating hazardous machinery when sleepy. 3. ENT examination as clinically indicated if primary snoring is of clinical concern. 4. A follow up appointment will be scheduled in the Sleep Clinic at [facility]. The referring provider will be notified of the results.     I certify that I have reviewed the entire raw data recording prior to the issuance of this report in accordance with the Standards of Accreditation of the American Academy of Sleep Medicine (AASM)    Larey Seat, MD  02-29-2016  Diplomat, American Board of Psychiatry and Neurology  Diplomat, American Board of Sleep Medicine Medical Director, [place] [facility] [employer]

## 2016-03-01 ENCOUNTER — Telehealth: Payer: Self-pay

## 2016-03-01 NOTE — Telephone Encounter (Signed)
I spoke to patient and she is aware of results and recommendations. She would like to come in and discuss further with Dr. Vickey Hugerohmeier. I was able to make her a f/u for tomorrow.

## 2016-03-01 NOTE — Telephone Encounter (Signed)
-----  Message from Larey Seat, MD sent at 02/29/2016  1:21 PM EST ----- IMPRESSION: This patient needs no longer CPAP, her apnea is mild, her sleep sustained and she has neither PLMs, nor hypoxemia, nor cardiac arrhythmias. AHI without CPAP use was 2.0/hr.   RECOMMENDATIONS:  The very high degree of daytime sleepiness is not explained by the benign findings of the PSG. I recommend a follow up HLA narcolepsy test. A MSLT would be non-valid, due to medication treatment for depression   1. Advise to lose weight, diet and exercise if not contraindicated (BMI 47). 2. Advise patient to avoid driving or operating hazardous machinery when sleepy. 3. ENT examination as clinically indicated if primary snoring is of clinical concern. 4. A follow up appointment will be scheduled in the Sleep Clinic at [facility]. The referring provider will be notified of the results.

## 2016-03-02 ENCOUNTER — Encounter: Payer: Self-pay | Admitting: Neurology

## 2016-03-02 ENCOUNTER — Ambulatory Visit (INDEPENDENT_AMBULATORY_CARE_PROVIDER_SITE_OTHER): Payer: Medicaid Other | Admitting: Neurology

## 2016-03-02 VITALS — BP 112/62 | HR 88 | Resp 20 | Ht 68.0 in | Wt 307.0 lb

## 2016-03-02 DIAGNOSIS — G4719 Other hypersomnia: Secondary | ICD-10-CM | POA: Diagnosis not present

## 2016-03-02 DIAGNOSIS — R0683 Snoring: Secondary | ICD-10-CM | POA: Diagnosis not present

## 2016-03-02 NOTE — Progress Notes (Signed)
SLEEP MEDICINE CLINIC   Provider:  Larey Seat, M D  Referring Provider: Archie Patten, MD Primary Care Physician:  Kathrine Cords, MD  Chief Complaint  Patient presents with  . Follow-up    Rm 11. Patient is here to discuss her sleep study     HPI:  [name] [name] is a 40 y.o. female , seen here as a referral from Dr. Lorenso Courier for a sleep consultation,  [name] [name] was diagnosed with obstructive sleep apnea in [place] approximately 7 years ago and she has been on CPAP ever since. Also she has continued to use CPAP compliantly she has developed hypersomnia and she is sleepier now than she has been years before treatment. She is falling asleep uncontrollably. She has a history of morbid obesity, diabetes, depression, carpal tunnel syndrome, frequent sinusitis, allergic rhinitis and stuffy nose -and she suffered from a staph infection in Summer of this year and was treated with intravenous antibiotics for prolonged course (MRSA , three recurrences) . Hospitalized 3 times. I do not have access to her original study but to a compliance download. The patient is using a Water quality scientist. Her 30 day compliance shows 70% compliance but only 57% with over 4 hours of nightly use. Average usage for days on CPAP of 7 hours and 17 minutes but there were 9 days without using CPAP. This month was unusual because she had struggled with a sinus infection. The mean pressure on her CPAP machine is 13.8 cm water, average pressure for 90% of user time is 15.8 cm water and the residual average AHI is 3.3 per hour.  She gained weight since her initial sleep study, now reached super-obesity.  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 Her bedtime is usually at about 7 PM plus minus one hour, she definitely goes to bed early. She states she falls asleep promptly she stays asleep. She does not feel that her sleep is fragmented. She will have up to 3 nocturia breaks at night. Her bedroom is quiet, cool and dark. She prefers to sleep on either side, and uses multiple pillows. She does not report heartburn or gastric reflux. Her family reports that she still snores without using CPAP. She has struggled with frequent upper respiratory infections and allergic obstructions of the nasal airway. She reports vivid dreams especially when she had a stressful day. She sometimes wakes up with a sensation that she cannot breathe. She does not report sleep paralysis, no cataplexy, but reports  dream intrusion and hypnagogic, hypnopompic hallucinations. She reports such visit life like dreams that it is hard to distinguish reality from dream. She also reports that she can be almost immediately dreaming once in bed and asleep. She wakes up spontaneously by 5 AM  (but has a back up alarm clock), and walks for exercise form 5.30 to 6.30 AM , managing to walk 1.2 miles.   Sleep medical history and family sleep history:  Father has OSA, son and daughter have OSA.  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  In social environments she may drink alcohol but not regularly, she is  not a tobacco user, caffeine use is rare.  Several history from 03/02/2016, I see [name] [name] today after her sleep study dated 02/17/2016. The patient slept excellent, she had insignificant low apneas, 2 per hour of sleep during REM sleep exacerbated to only 4.1, no supine exacerbation, now oxygen desaturation. Her apnea is so mild and her sleep well sustained but I recommended that she does not need CPAP would be do need to address is her excessive daytime sleepiness and fatigue and today  again she addressed the fatigue severity score at 50 points and the Epworth sleepiness score at 15 points. She did an HLA narcolepsy blood test before that was negative. Is on Effexor and Lamictal which affects the ability to do a valid MS LT. She just started vitamin D supplementation schedule.   Review of Systems: Out of a complete 14 system review, the patient complains of only the following symptoms, and all other reviewed systems are negative.  weight gain,  Snoring, OSA on CPAP, hypersomnia. Fatigue.  How likely are you to doze in the following situations: 0 = not likely, 1 = slight chance, 2 = moderate chance, 3 = high chance  Sitting and Reading?2 Watching Television?3 Sitting inactive in a public place (theater or meeting)?3 As a passenger in a car for an hour without a break?3  Lying down in the afternoon when circumstances permit?2 Sitting and talking to someone?0 Sitting quietly after lunch without alcohol?2 In a car, while stopped for a few minutes in traffic?2   Total =17   , Fatigue severity score 46  , depression score 3/15   Social History   Social History  . Marital status: Single    Spouse name: N/A  . Number of children: 3  . Years of education: N/A   Occupational History  . Not on file.   Social History Main Topics  . Smoking status: Never Smoker  . Smokeless tobacco: Never Used  . Alcohol use 0.0 oz/week     Comment: occ  . Drug use: No  . Sexual activity: Not on file   Other Topics Concern  . Not on file   Social History Narrative  . No narrative on file    Family History  Problem Relation Age of Onset  . Hypertension Mother   . Diabetes Mellitus II Mother   . Congestive Heart Failure Father   . Hypertension Father   . Diabetes Mellitus II Father     Past Medical History:  Diagnosis Date  . Allergy   . Anemia   . Bipolar 1 disorder [facility])    Sees Dr. Pablo Ledger, psychology,  [phone]  . Carpal tunnel syndrome   . Depression     . Diabetes mellitus without complication ([place])   . Family history of adverse reaction to anesthesia    mother & son have difficulty waking  . Fatigue   . Sleep apnea    uses cpap    Past Surgical History:  Procedure Laterality Date  . ABDOMINAL HYSTERECTOMY    . I&D EXTREMITY Right 09/14/2015   Procedure: IRRIGATION AND DEBRIDEMENT RIGHT MIDDLE FINGER;  Surgeon: Roseanne Kaufman, MD;  Location: [facility];  Service: Orthopedics;  Laterality: Right;  . TUBAL LIGATION      Current Outpatient Prescriptions  Medication Sig Dispense Refill  . cetirizine (ZYRTEC) 10 MG tablet Take 1 tablet (10 mg total) by mouth daily. 90 tablet 0  . Insulin Glargine (LANTUS SOLOSTAR) 100 UNIT/ML Solostar Pen Inject 30 Units into the  skin daily. 15 mL 11  . lamoTRIgine (LAMICTAL) 25 MG tablet Take 67m for 2 weeks, then 518mfor 2 weeks, then 10081mor 1 week, then resume 200m37mily 240 tablet 0  . metFORMIN (GLUCOPHAGE XR) 500 MG 24 hr tablet Take 2 tablets (1,000 mg total) by mouth at bedtime. 60 tablet 1  . modafinil (PROVIGIL) 200 MG tablet Take 1 tablet (200 mg total) by mouth daily. 30 tablet 5  . venlafaxine XR (EFFEXOR-XR) 150 MG 24 hr capsule Take 1 capsule (150 mg total) by mouth daily with breakfast. 30 capsule 3  . Vitamin D, Ergocalciferol, (DRISDOL) 50000 units CAPS capsule Take 1 capsule (50,000 Units total) by mouth every 7 (seven) days. 8 capsule 0   No current facility-administered medications for this visit.     Allergies as of 03/02/2016 - Review Complete 03/02/2016  Allergen Reaction Noted  . Hydromorphone hcl Anaphylaxis and Hives 11/12/2008  . Meperidine hcl Anaphylaxis and Hives 11/12/2008  . Morphine Anaphylaxis and Hives 11/12/2008  . Lisinopril Cough 07/28/2005  . Sumatriptan Other (See Comments) 07/01/2007  . Tape Rash 11/23/2015  . Zolmitriptan Other (See Comments) 07/01/2007    Vitals: BP 112/62   Pulse 88   Resp 20   Ht _0  (1.727 m)   Wt (!) 307 lb (139.3 kg)   BMI  46.68 kg/m  Last Weight:  Wt Readings from Last 1 Encounters:  03/02/16 (!) 307 lb (139.3 kg)   BMI:HEN:IDPOs index is 46.68 kg/m.     Last Height:   Ht Readings from Last 1 Encounters:  03/02/16 _1  (1.727 m)    Physical exam:  General: The patient is awake, alert and appears not in acute distress. The patient is well groomed. Head: Normocephalic, atraumatic. Neck is supple. Mallampati 4,  neck circumference: 17. Nasal airflow patent , TMJ click isevident . Bruxism marks. Retrognathia is seen.  Cardiovascular:  Regular rate and rhythm , without  murmurs or carotid bruit, and without distended neck veins. Respiratory: Lungs are clear to auscultation. Skin:  Without evidence of edema, or rash Trunk: BMI is 47  Neurologic exam : The patient is awake and alert, oriented to place and time.     Attention span & concentration ability appears normal.  Speech is fluent,  without dysarthria, dysphonia or aphasia.  Mood and affect are appropriate.  Cranial nerves: Pupils are equal and briskly reactive to light. Funduscopic exam without  evidence of pallor or edema. Extraocular movements  in vertical and horizontal planes intact and without nystagmus. Visual fields by finger perimetry are intact.Hearing to finger rub intact.  Facial sensation intact to fine touch. Facial motor strength is symmetric and tongue and uvula move midline. Shoulder shrug was symmetrical.    The patient was advised of the nature of the diagnosed sleep disorder , the treatment options and risks for general a health and wellness arising from not treating the condition.  I spent more than 20 minutes of face to face time with the patient. Greater than 50% of time was spent in counseling and coordination of care. We have discussed the diagnosis and differential and I answered the patient's questions.     Assessment:  After physical and neurologic examination, review of laboratory studies,  Personal review of imaging  studies, reports of other /same  Imaging studies ,  Results of polysomnography/ neurophysiology testing and pre-existing records as far as provided in visit., my assessment is   1) She had a bad year with  recurrent infections and prolonged and repeated antibiotic treatments. She is using Zyrtec daily to prevent allergic rhinitis. She was not using her CPSP compliantly but it seems that she doesn't need CPAp at all.   Given the current degree of hypersomnia in a patient that usually sleeps 8 hours or more each night and does not feel that her sleep is extremely fragmented, I am concerned that she may have another underlying sleep disorder aside from obstructive sleep apnea. She may also have obesity hypoventilation given her high body mass index, high-grade Mallampati, and more than average neck circumference. Diabetes can also contribute to fatigue.  I have also entertained the possibility of the presence of narcolepsy without cataplexy.HLA test was negative, MSLT is not possible.    Plan:  Treatment plan and additional workup : At this time no need for CPAP any longer. Weight loss is recommended. She is weekly at the gym.  She walks .  Modafinil for hypersomnia in treatment compliant sleep apnea patients was prescribed last time.  I cannot use the same diagnosis any longer.  I like for Mrs. Cranshaw to implement her exercise regimen and dietary changes as well as the supplementation of vitamin B12 and vitamin D.  Asencion Partridge Naria Abbey MD  03/02/2016   CC: [facility] [address] [place], [place] [address]

## 2016-03-02 NOTE — Patient Instructions (Signed)
Use of a dental device and is advised for patients that have neither hypoxemia nor significant REM dependent apnea but snore loudly.  If [name] [name]  desires, we will arrange for a referral to a dentist with [name].

## 2016-03-14 ENCOUNTER — Telehealth: Payer: Self-pay

## 2016-03-14 NOTE — Telephone Encounter (Signed)
I completed pa for provigil with medicaid. Surprisingly, even though pt does not have a diagnosis of narcolepsy, osa on cpap, MS, myotonic dystrophy, or shift work, the pa was approved for provigil from 03/14/16 to 03/09/2017. PA # [id number]  I called pt and advised her that medicaid approved her provigil and she should be able to pick it up at [facility] pharmacy. Pt verbalized understanding.   Pt's HLA results also came back negative (normal) and Dr. Brett Fairy reviewed these results. I advised pt of this. Pt verbalized understanding.

## 2016-03-15 ENCOUNTER — Institutional Professional Consult (permissible substitution): Payer: Medicaid Other | Admitting: Pulmonary Disease

## 2016-03-30 ENCOUNTER — Ambulatory Visit (INDEPENDENT_AMBULATORY_CARE_PROVIDER_SITE_OTHER): Payer: Medicaid Other | Admitting: Family Medicine

## 2016-03-30 ENCOUNTER — Encounter: Payer: Self-pay | Admitting: Family Medicine

## 2016-03-30 VITALS — HR 92 | Temp 98.1°F | Wt 315.0 lb

## 2016-03-30 DIAGNOSIS — R52 Pain, unspecified: Secondary | ICD-10-CM | POA: Diagnosis not present

## 2016-03-30 DIAGNOSIS — M542 Cervicalgia: Secondary | ICD-10-CM

## 2016-03-30 MED ORDER — KETOROLAC TROMETHAMINE 30 MG/ML IJ SOLN
30.0000 mg | Freq: Once | INTRAMUSCULAR | Status: AC
  Administered 2016-03-30: 30 mg via INTRAMUSCULAR

## 2016-03-30 NOTE — Patient Instructions (Addendum)
Thank you for coming in today, it was so nice to see you! Today we talked about:    Mammogram: From what I can tell you do not need a referral. I have given you information for a mammogram. Please call them to schedule  Headache/neck pain: We gave you a Toradol shot. Please come back in a week if you continue to have pain.    If you have any questions or concerns, please do not hesitate to call the office at [phone]. You can also message me directly via MyChart.   Sincerely,  Anders Simmondshristina Aesha Agrawal, MD

## 2016-03-30 NOTE — Progress Notes (Signed)
Subjective:    Patient ID: [name] [name] , female   DOB: 10/02/1975 , 41 y.o..   MRN: [id number]  HPI  [name] PatrickRhonda N Newhart is here for:  Mammogram referral: Patient states that she was told before that she needed a referral to get a mammogram. She would like a mammogram for her yearly screening.  Headache and neck ache: Patient notes that she has had a headache for the last couple days. The headache is on the left side of her forehead and radiates down to her neck. She notes that her neck may be even more painful than her head. She has not tried anything for this pain. She denies any trauma or heavy lifting. She denies any aura or seeing spots. She is not sensitive to light or sound. She denies any fevers, chills, nausea, vomiting, abdominal pain, chest pain, shortness of breath.  Review of Systems: Per HPI. All other systems reviewed and are negative.  Past Medical History: Patient Active Problem List   Diagnosis Date Noted  . Excessive daytime sleepiness 02/09/2016  . OSA on CPAP 02/09/2016  . Super obese (HCC) 02/09/2016  . Vivid dream 02/09/2016  . Acute suppurative otitis media of right ear with spontaneous rupture of tympanic membrane 01/05/2016  . Bacterial conjunctivitis of right eye 01/05/2016  . Diabetes mellitus with hyperglycemia (HCC) 11/25/2015  . Cellulitis of right hand 11/23/2015  . Pain   . Cellulitis 09/14/2015  . Abscess of finger 09/14/2015  . Cellulitis of finger of right hand 09/14/2015  . Type 2 diabetes mellitus without complication, without long-term current use of insulin (HCC)   . Abdominal pain, left lower quadrant 07/13/2014  . Groin pain, chronic, right 07/13/2014  . Lung nodule seen on imaging study 09/24/2013  . Chest pain 09/24/2013  . Diabetes mellitus (HCC) 07/11/2012  . Pollen allergies 07/11/2012  . 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 Meralgia paresthetica of left side 08/18/2010  . Fatigue 04/20/2010  . INTERSTITIAL CYSTITIS 07/22/2007  .  MENOPAUSE, SURGICAL 04/24/2007  . HIATAL HERNIA 11/20/2006  . Hypercholesterolemia 05/03/2006  . OBESITY, NOS 05/03/2006  . ANEMIA, IRON DEFICIENCY, UNSPEC. 05/03/2006  . Bipolar I disorder (HCC) 05/03/2006  . ANXIETY 05/03/2006  . MIGRAINE, UNSPEC., W/O INTRACTABLE MIGRAINE 05/03/2006  . HYPERTENSION, BENIGN SYSTEMIC 05/03/2006  . PEPTIC ULCER DIS., UNSPEC. W/O OBSTRUCTION 05/03/2006    Medications: reviewed and updated Current Outpatient Prescriptions  Medication Sig Dispense Refill  . cetirizine (ZYRTEC) 10 MG tablet Take 1 tablet (10 mg total) by mouth daily. 90 tablet 0  . Insulin Glargine (LANTUS SOLOSTAR) 100 UNIT/ML Solostar Pen Inject 30 Units into the skin daily. 15 mL 11  . lamoTRIgine (LAMICTAL) 25 MG tablet Take 25mg  for 2 weeks, then 50mg  for 2 weeks, then 100mg  for 1 week, then resume 200mg  daily 240 tablet 0  . metFORMIN (GLUCOPHAGE XR) 500 MG 24 hr tablet Take 2 tablets (1,000 mg total) by mouth at bedtime. 60 tablet 1  . modafinil (PROVIGIL) 200 MG tablet Take 1 tablet (200 mg total) by mouth daily. 30 tablet 5  . venlafaxine XR (EFFEXOR-XR) 150 MG 24 hr capsule Take 1 capsule (150 mg total) by mouth daily with breakfast. 30 capsule 3  . Vitamin D, Ergocalciferol, (DRISDOL) 50000 units CAPS capsule Take 1 capsule (50,000 Units total) by mouth every 7 (seven) days. 8 capsule 0   No current facility-administered medications for this visit.     Social Hx:  reports that she has never smoked. She has never  used smokeless tobacco.   Objective:   Pulse 92   Temp 98.1 F (36.7 C) (Oral)   Wt (!) 315 lb (142.9 kg)   SpO2 96%   BMI 47.90 kg/m  Physical Exam  Gen: NAD, alert, cooperative with exam, well-appearing, Morbidly obese HEENT: NCAT, PERRL, clear conjunctiva, oropharynx clear, supple neck Cardiac: Regular rate and rhythm, normal S1/S2, no murmur, no edema, capillary refill brisk  Respiratory: Clear to auscultation bilaterally, no wheezes, non-labored  breathing Skin: no rashes, normal turgor  Neurological: no gross deficits.  Neck: Inspection: Normal head position, no muscular atrophy  Palpation: Midline cervical tenderness to palpation, no nuchal rigidity, , no muscle spasms Range of Motion: Full range of motion. Normal neck flexion, extension, lateral movements, and side bending Strength: 5/5 flexion, extension, lateral movements, and side bending. 5/5 upper extremity strength bilaterally.  Sensation: Gross sensation intact in neck and upper extremities Psych: good insight, normal mood and affect   Assessment & Plan:  Pain Patient noted to have a headache that is radiating to her neck. No signs of meningitis or any infection. No signs of head or cervical trauma. Pain is likely a headache with referred pain to her neck or vice versa. Toradol shot provided in the office. Patient did not want any sedating medications at this time. Patient will take over-the-counter Tylenol as needed. Discussed return precautions and suggested follow-up if symptoms do not improve.  Mammogram: Patient is due for mammogram, referral not place at this time as it is likely she needs referral. Patient given information about how to obtain a mammogram and who to call.   Anders Simmonds, MD [facility], PGY-2

## 2016-03-31 NOTE — Assessment & Plan Note (Signed)
Patient noted to have a headache that is radiating to her neck. No signs of meningitis or any infection. No signs of head or cervical trauma. Pain is likely a headache with referred pain to her neck or vice versa. Toradol shot provided in the office. Patient did not want any sedating medications at this time. Patient will take over-the-counter Tylenol as needed. Discussed return precautions and suggested follow-up if symptoms do not improve.

## 2016-05-03 ENCOUNTER — Other Ambulatory Visit: Payer: Self-pay | Admitting: Family Medicine

## 2016-05-31 ENCOUNTER — Other Ambulatory Visit: Payer: Self-pay | Admitting: Family Medicine

## 2016-06-01 MED ORDER — LAMOTRIGINE 100 MG PO TABS: 100.0000 mg | ORAL_TABLET | Freq: Every day | ORAL | 0 refills | Status: DC

## 2016-06-01 NOTE — Telephone Encounter (Signed)
Pt is calling to check the status of her refill request on Lamotrigine. She is out of medication and would like this filled before the long weekend. jw

## 2016-06-01 NOTE — Telephone Encounter (Deleted)
Pt is calling to check the status of her refill request on Lamotrigine. She is out of medication and would like this filled before the long weekend. jw °

## 2016-06-01 NOTE — Telephone Encounter (Signed)
Last refilled in September by myself. She states she is taking Lamictal 100mg , 2 tablets (200mg ) daily.  I discussed that the last thing I refilled for her was the 25mg  tablets as she was having to taper back up due to her abruptly stopping her medication.   She cannot tell me where or who she's getting the medication from but states that she she's been taking the 100mg  tablets and just ran out completely. She is still seeing a therapist.  I asked her to f/u with me as I have not seen her in some time; she states, "I just saw you in Jan." I pointed out that her last appt with me was in Nov 2017 and that was for a same day appt.  Will get 1 month supply only, will need to follow up with me within the next month  Joanna Puffrystal S. Nirel Babler, MD [facility] Resident  06/01/2016, 1:06 PM

## 2016-06-22 ENCOUNTER — Telehealth: Payer: Self-pay | Admitting: Family Medicine

## 2016-06-22 NOTE — Telephone Encounter (Signed)
Pt is having problems with her blood sugar. It is running 197-448.  She is taking her medicines as prescribed. Pt would like to discuss this with dr Raymondo Band

## 2016-06-23 ENCOUNTER — Encounter: Payer: Self-pay | Admitting: Family Medicine

## 2016-06-26 NOTE — Telephone Encounter (Signed)
Returning patient's call (was forwarded to me on Friday).   Patient notes her CBGs have been out of control (195-480s). She was taking  metformin  and Lantus 30 units from her previus discharge, but because her CBGs were so high she thought changing it back to it's previous dose may be helpful. She's now taking Lantus 20 units daily and metformin  daily.   Her sugar is still not under control.  CBG lowest 195 (fasting).   Can't tell me symptoms of hypoglycemia, we discussed this thoroughly. Discussed that you don't want to decrease insulin if you CBGs are consistently elevated.   Patient to return to Lantus 30 units daily. Continue metformin . If CBGs still >200 with no CBGs in 70s, can go up to Lantus 35 units.  Joanna Puff, MD [facility] Resident  06/26/2016, 8:18 AM

## 2016-06-29 ENCOUNTER — Encounter: Payer: Self-pay | Admitting: Family Medicine

## 2016-07-03 ENCOUNTER — Ambulatory Visit: Payer: Medicaid Other | Admitting: Family Medicine

## 2016-07-19 ENCOUNTER — Encounter: Payer: Self-pay | Admitting: Family Medicine

## 2016-07-28 ENCOUNTER — Other Ambulatory Visit: Payer: Self-pay | Admitting: Family Medicine

## 2016-08-04 NOTE — Telephone Encounter (Signed)
The patient has lost her insurance and is requesting medication changes through the MAP program. Will you please touch base with the patient and have her come in for evaluation-- I cannot change her medications without her coming frequently to monitor the response. For now she will continue on the Effexor.   Thank you,  Joanna Puffrystal S. Dornell Grasmick, MD [facility] Resident  08/04/2016, 12:19 PM

## 2016-08-07 ENCOUNTER — Ambulatory Visit (INDEPENDENT_AMBULATORY_CARE_PROVIDER_SITE_OTHER): Payer: Self-pay | Admitting: Family Medicine

## 2016-08-07 ENCOUNTER — Telehealth: Payer: Self-pay | Admitting: Family Medicine

## 2016-08-07 ENCOUNTER — Encounter: Payer: Self-pay | Admitting: Family Medicine

## 2016-08-07 VITALS — BP 118/84 | HR 95 | Temp 98.3°F | Wt 308.0 lb

## 2016-08-07 DIAGNOSIS — E1165 Type 2 diabetes mellitus with hyperglycemia: Secondary | ICD-10-CM

## 2016-08-07 DIAGNOSIS — I10 Essential (primary) hypertension: Secondary | ICD-10-CM

## 2016-08-07 DIAGNOSIS — G4719 Other hypersomnia: Secondary | ICD-10-CM

## 2016-08-07 DIAGNOSIS — F319 Bipolar disorder, unspecified: Secondary | ICD-10-CM

## 2016-08-07 DIAGNOSIS — E559 Vitamin D deficiency, unspecified: Secondary | ICD-10-CM

## 2016-08-07 LAB — POCT GLYCOSYLATED HEMOGLOBIN (HGB A1C): Hemoglobin A1C: 9.3

## 2016-08-07 MED ORDER — LAMOTRIGINE 25 MG PO TABS: 50.0000 mg | ORAL_TABLET | Freq: Every day | ORAL | 0 refills | Status: DC

## 2016-08-07 MED ORDER — INSULIN GLARGINE 100 UNIT/ML SOLOSTAR PEN: 40.0000 [IU] | PEN_INJECTOR | Freq: Every day | SUBCUTANEOUS | 11 refills | Status: DC

## 2016-08-07 NOTE — Assessment & Plan Note (Signed)
Was previously well controlled. Given change in insurance, will need to change her regimen around. The patient has also been off Lamictal for over one week. -We will start Lamictal 50 mg and increase by 50 mg every 2 weeks to get her back up to 200 mg daily. I asked the patient to contact my office in 2 weeks to request a refill on the Lamictal 200 mg to remind me to send in the new prescription. -My thought is that the best alternative to Effexor would be Cymbalta(pt is unsure if she's ever been on this).  I have touched base with Dr. Jennette BankerMcKnight's office and he will contact me again concerning co-management to see if he has any further insight.

## 2016-08-07 NOTE — Progress Notes (Signed)
Subjective: CC: medication review  HPI: Patient is a 41 y.o. female with a past medical history of HTN, OSA, T2DM, bipolar disorder, and anxiety presenting to clinic today for a a same-day appointment for medication review.   Bipolar She has [facility]. She was previously on Lamictal 200 mg daily and Effexor 150 mg daily The patient has been out of Lamictal for over one week now. She states she has talked to Dr. Ledon Snare about the MAP program not covering Lamictal  I discussed with the patient, Lamictal is free per the MAPprogram, it is Effexor that has not been covered. She still has some Effexor left. Patient notes some worsening in her mood stability off Lamictal.   Diabetes:  CBGs at home: 228 last week, she is not checked her blood sugar since that time. Taking medications:  Lantus 35 units qHS.  Side effects: None ROS: denies fever, chills, dizziness, LOC, polyuria, polydipsia, numbness or tingling in extremities or chest pain. Last eye exam:  >1 year  Nephropathy screen indicated?: yes, unable to get specimen today   Social History: nonsmoker   ROS: All other systems reviewed and are negative.  Past Medical History Patient Active Problem List   Diagnosis Date Noted  . Excessive daytime sleepiness 02/09/2016  . OSA on CPAP 02/09/2016  . Super obese 02/09/2016  . Vivid dream 02/09/2016  . Acute suppurative otitis media of right ear with spontaneous rupture of tympanic membrane 01/05/2016  . Bacterial conjunctivitis of right eye 01/05/2016  . Diabetes mellitus with hyperglycemia (HCC) 11/25/2015  . Cellulitis of right hand 11/23/2015  . Cellulitis 09/14/2015  . Abscess of finger 09/14/2015  . Cellulitis of finger of right hand 09/14/2015  . Type 2 diabetes mellitus without complication, without long-term current use of insulin (HCC)   . Abdominal pain, left lower quadrant 07/13/2014  . Lung nodule seen  on imaging study 09/24/2013  . Chest pain 09/24/2013  . Diabetes mellitus (HCC) 07/11/2012  . 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 Meralgia paresthetica of left side 08/18/2010  . Fatigue 04/20/2010  . INTERSTITIAL CYSTITIS 07/22/2007  . MENOPAUSE, SURGICAL 04/24/2007  . HIATAL HERNIA 11/20/2006  . Hypercholesterolemia 05/03/2006  . OBESITY, NOS 05/03/2006  . ANEMIA, IRON DEFICIENCY, UNSPEC. 05/03/2006  . Bipolar I disorder (HCC) 05/03/2006  . ANXIETY 05/03/2006  . MIGRAINE, UNSPEC., W/O INTRACTABLE MIGRAINE 05/03/2006  . HYPERTENSION, BENIGN SYSTEMIC 05/03/2006  . PEPTIC ULCER DIS., UNSPEC. W/O OBSTRUCTION 05/03/2006    Medications- reviewed and updated Current Outpatient Prescriptions  Medication Sig Dispense Refill  . cetirizine (ZYRTEC) 10 MG tablet Take 1 tablet (10 mg total) by mouth daily. 90 tablet 0  . Insulin Glargine (LANTUS SOLOSTAR) 100 UNIT/ML Solostar Pen Inject 30 Units into the skin daily. 15 mL 11  . lamoTRIgine (LAMICTAL) 25 MG tablet Take 2 tablets (50 mg total) by mouth daily. For 2 weeks, then go up to 100mg  daily for 2 weeks, then 150mg  daily x 2 weeks 168 tablet 0  . metFORMIN (GLUCOPHAGE-XR) 500 MG 24 hr tablet TAKE TWO TABLETS BY MOUTH EVERY NIGHT AT BEDTIME 60 tablet 1  . modafinil (PROVIGIL) 200 MG tablet Take 1 tablet (200 mg total) by mouth daily. 30 tablet 5  . venlafaxine XR (EFFEXOR-XR) 150 MG 24 hr capsule Take 1 capsule (150 mg total) by mouth daily with breakfast. 30 capsule 3  . Vitamin D, Ergocalciferol, (DRISDOL) 50000 units CAPS capsule Take 1 capsule (50,000 Units total)  by mouth every 7 (seven) days. 8 capsule 0   No current facility-administered medications for this visit.     Objective: Office vital signs reviewed. BP 118/84   Pulse 95   Temp 98.3 F (36.8 C) (Oral)   Wt (!) 308 lb (139.7 kg)   BMI 46.83 kg/m    Physical Examination:  General: Awake, alert, well- nourished, NAD Eyes: Conjunctiva non-injected. Cardio: RRR, no m/r/g  noted.  Pulm: No increased WOB.  CTAB, without wheezes, rhonchi or crackles noted.  GI: soft, NT/ND Extremities: no edema  MSK: Normal gait and station Psych: Alert. Stated mood good. Affect congruent. Normal speech and tone. Nonpressured. No auditory or visual hallucinations. No HI, SI. Patient is not appear internally distracted. Insight fair, judgment fair.  Assessment/Plan: HYPERTENSION, BENIGN SYSTEMIC On the patient's problem list was but patient's blood pressure has been stable and our visits. If noted to be elevated, start an ACE inhibitor or ARB.  Diabetes mellitus with hyperglycemia (HCC) A1c elevated since her last visit. Concerns given poor follow-up due to insurance issues. - Increase Lantus to 40 units daily -Continue metformin -Discussed lifestyle modifications -We'll attempt to get a urine specimen at next appointment for nephropathy screening -Once the patient is able to get some sort of insurance, will discuss appropriate immunizations. -Patient endorses difficulty getting to appointments due to financial constraints, asked patient to check CBGs regularly and MyChart me those numbers next week. -Follow-up in 3 months at the latest.  Excessive daytime sleepiness Patient not requiring Provigil. She states she never started this medication.  Bipolar I disorder (HCC) Was previously well controlled. Given change in insurance, will need to change her regimen around. The patient has also been off Lamictal for over one week. -We will start Lamictal 50 mg and increase by 50 mg every 2 weeks to get her back up to 200 mg daily. I asked the patient to contact my office in 2 weeks to request a refill on the Lamictal 200 mg to remind me to send in the new prescription. -My thought is that the best alternative to Effexor would be Cymbalta(pt is unsure if she's ever been on this).  I have touched base with Dr. Jennette BankerMcKnight's office and he will contact me again concerning co-management to  see if he has any further insight.    Orders Placed This Encounter  Procedures  . VITAMIN D 25 Hydroxy (Vit-D Deficiency, Fractures)  . POCT HgB A1C (CPT 83036)    Meds ordered this encounter  Medications  . lamoTRIgine (LAMICTAL) 25 MG tablet    Sig: Take 2 tablets (50 mg total) by mouth daily. For 2 weeks, then go up to 100mg  daily for 2 weeks, then 150mg  daily x 2 weeks    Dispense:  168 tablet    Refill:  0    Joanna Puffrystal S. Clariece Roesler PGY-3, [facility]

## 2016-08-07 NOTE — Assessment & Plan Note (Signed)
Will recheck vitamin D level today.

## 2016-08-07 NOTE — Assessment & Plan Note (Addendum)
On the patient's problem list was but patient's blood pressure has been stable and our visits. If noted to be elevated, start an ACE inhibitor or ARB.

## 2016-08-07 NOTE — Patient Instructions (Addendum)
I've left a message with Dr. Jennette BankerMcKnight's office.  The Effexor is the medication that isn't covered by the MAP program. Continue to take this-- my thought is switching you to Cymbalta. If Dr. Jennette BankerMcKnight's office contacts you prior to contacting me, you can inquire about this medication as well.  The Lamictal is free- I will send that prescription in today. Start at 50mg  and go up by 50mg  every 2 weeks until you get up to 200mg . This means that initially you'll be taking 2 tablets per day, then 4 tablets per day, then 6 tablets per day.  Please call at the end of the month for a refill on your medication (at that time we can give you the 100mg  tablets and you will only need to take 2).  Increase Lantus to 40 units daily. Message me next week with all you blood sugar readings.

## 2016-08-07 NOTE — Telephone Encounter (Signed)
Return call. The patient is currently on Lamictal 50 mg as she discontinued over one week ago and we are now trying to taper back up to 200 mg daily.  Ms. Jens SomCrenshaw was very confused about what medications the MAP program would cover. The MAP program will cover Lamictal at 100%, however they do not cover Effexor.  My thought is to start Cymbalta 40 mg and titrate up as needed. The patient cannot recall if she is ever taken this medication. Ms. Judeth CornfieldStephanie will touch base with me again after talking to Dr. Ledon SnareMcKnight concerning the potential change.  Joanna Puffrystal S. Dorsey, MD [facility] Resident  08/07/2016, 3:25 PM

## 2016-08-07 NOTE — Telephone Encounter (Signed)
[name] CornfieldStephanie is the Print production planneroffice manager for Dr Ledon SnareMcKnight. She states pt was on lamictal at 200mg  and doesn't know why it was written at 50mg .  Pt also wants to know why she is taking effexor and lamicital.  Pt also wants a letter but unsure of what kind of letter she is needing,

## 2016-08-07 NOTE — Assessment & Plan Note (Signed)
A1c elevated since her last visit. Concerns given poor follow-up due to insurance issues. - Increase Lantus to 40 units daily -Continue metformin -Discussed lifestyle modifications -We'll attempt to get a urine specimen at next appointment for nephropathy screening -Once the patient is able to get some sort of insurance, will discuss appropriate immunizations. -Patient endorses difficulty getting to appointments due to financial constraints, asked patient to check CBGs regularly and MyChart me those numbers next week. -Follow-up in 3 months at the latest.

## 2016-08-07 NOTE — Assessment & Plan Note (Signed)
Patient not requiring Provigil. She states she never started this medication.

## 2016-08-08 ENCOUNTER — Telehealth: Payer: Self-pay | Admitting: Family Medicine

## 2016-08-08 LAB — VITAMIN D 25 HYDROXY (VIT D DEFICIENCY, FRACTURES): Vit D, 25-Hydroxy: 18.4 ng/mL — ABNORMAL LOW (ref 30.0–100.0)

## 2016-08-08 NOTE — Telephone Encounter (Signed)
I called [name] [name] regarding recommendations from [name] [name]. He felt starting at Cymbalta 20mg  x 1 week then going up to 40mg  daily would be the best option. Will send in new Rx to the MAP program.  [name] Puffrystal S. Aveion Nguyen, MD [facility] Resident  08/08/2016, 8:43 AM

## 2016-08-11 ENCOUNTER — Encounter: Payer: Self-pay | Admitting: Family Medicine

## 2016-08-11 MED ORDER — METFORMIN HCL ER (MOD) 1000 MG PO TB24: 1000.0000 mg | ORAL_TABLET | Freq: Every day | ORAL | 0 refills | Status: DC

## 2016-08-11 MED ORDER — DULOXETINE HCL 20 MG PO CPEP: 20.0000 mg | ORAL_CAPSULE | Freq: Every day | ORAL | 0 refills | Status: DC

## 2016-08-11 MED ORDER — CETIRIZINE HCL 10 MG PO TABS: 10.0000 mg | ORAL_TABLET | Freq: Every day | ORAL | 0 refills | Status: DC

## 2016-08-11 MED ORDER — PITAVASTATIN CALCIUM 2 MG PO TABS: 2.0000 mg | ORAL_TABLET | Freq: Every day | ORAL | 0 refills | Status: DC

## 2016-08-11 MED ORDER — INSULIN GLARGINE 100 UNIT/ML SOLOSTAR PEN: 40.0000 [IU] | PEN_INJECTOR | Freq: Every day | SUBCUTANEOUS | 0 refills | Status: DC

## 2016-08-11 MED ORDER — VITAMIN D (ERGOCALCIFEROL) 1.25 MG (50000 UNIT) PO CAPS: 50000.0000 [IU] | ORAL_CAPSULE | ORAL | 0 refills | Status: DC

## 2016-08-17 ENCOUNTER — Other Ambulatory Visit: Payer: Self-pay | Admitting: *Deleted

## 2016-08-17 NOTE — Telephone Encounter (Signed)
[name] from MAP called requesting refills on patient's medications.  MAP did not receive any of the refill request from 08/11/16.  [facility] zyrtec 10 mg, cymbalta, lantus Solostar, metformin and Livalo.  MAP does not carry Vitamin D.  Medication need to sent to a different pharmacy.  Clovis PuMartin, Tamika L, RN

## 2016-08-17 NOTE — Telephone Encounter (Signed)
Please let the pt know that the MAP program does not cover the Vitamin D. Please ask her what pharmacy she would like that Rx to go to.  Thanks, Joanna Puffrystal S. Dorsey, MD [facility] Resident  08/17/2016, 5:43 PM

## 2016-08-18 NOTE — Telephone Encounter (Signed)
PAtietn states she would like Vitamin D to be sent to [facility].

## 2016-08-21 MED ORDER — VITAMIN D (ERGOCALCIFEROL) 1.25 MG (50000 UNIT) PO CAPS: 50000.0000 [IU] | ORAL_CAPSULE | ORAL | 0 refills | Status: DC

## 2016-08-21 NOTE — Telephone Encounter (Signed)
Rx sent in.  Joanna Puffrystal S. Collier Monica, MD [facility] Res[facility]  08/21/2016, 8:47 AM

## 2016-08-21 NOTE — Addendum Note (Signed)
Addended by: Joanna PuffRSEY, Casimiro Lienhard S on: 08/21/2016 08:47 AM   Modules accepted: Orders

## 2016-08-22 ENCOUNTER — Telehealth: Payer: Self-pay

## 2016-08-22 NOTE — Telephone Encounter (Signed)
Pt is out of Effexor. MAP doesn't have any of the replacement medication and it will take 6 weeks before the medication is at MAP. Needs to have a Rx for generic effexor  [facility] Pt is out of medication. Sunday SpillersSharon T Ran Tullis, CMA

## 2016-08-23 MED ORDER — VENLAFAXINE HCL ER 150 MG PO CP24: 150.0000 mg | ORAL_CAPSULE | Freq: Every day | ORAL | 1 refills | Status: DC

## 2016-08-23 NOTE — Telephone Encounter (Signed)
Left message on voicemail informing of message from MD. 

## 2016-08-23 NOTE — Telephone Encounter (Signed)
[facility], she can stop this when she gets the Cymbalta.  Joanna Puffrystal S. Dorsey, MD [facility] Resident  08/23/2016, 8:11 AM

## 2016-08-23 NOTE — Addendum Note (Signed)
Addended by: Joanna PuffRSEY, Caoilainn Sacks S on: 08/23/2016 08:15 AM   Modules accepted: Orders

## 2016-09-07 ENCOUNTER — Encounter (HOSPITAL_COMMUNITY): Payer: Self-pay | Admitting: Emergency Medicine

## 2016-09-07 ENCOUNTER — Ambulatory Visit (HOSPITAL_COMMUNITY)
Admission: EM | Admit: 2016-09-07 | Discharge: 2016-09-07 | Disposition: A | Discharge status: Home / Self Care | Payer: Self-pay | Attending: Family Medicine | Admitting: Family Medicine

## 2016-09-07 ENCOUNTER — Ambulatory Visit (INDEPENDENT_AMBULATORY_CARE_PROVIDER_SITE_OTHER): Payer: Self-pay

## 2016-09-07 DIAGNOSIS — S6710XA Crushing injury of unspecified finger(s), initial encounter: Secondary | ICD-10-CM

## 2016-09-07 NOTE — ED Provider Notes (Signed)
  [facility]   [id number] 09/07/16 Arrival Time: 1113  ASSESSMENT & PLAN:  Today you were diagnosed with the following: 1. Crushing injury of finger, initial encounter    Your x-ray showed no finger fracture.  You have not been prescribed prescription medications this visit.  You may use over the counter ibuprofen or acetaminophen as needed.   If you are not improving over the next few days or feel you are worsening please follow up here or the Emergency Department if you are unable to see your regular doctor.   Reviewed expectations re: course of current medical issues. Questions answered. Outlined signs and symptoms indicating need for more acute intervention. Patient verbalized understanding. After Visit Summary given.   SUBJECTIVE:  [name] [name] is a 41 y.o. female who presents with complaint of R 3rd finger injury yesterday. Lowering garage door when finger was caught between door slats. Door immediately reversed and finger freed. Normal decreased sensation in fingers secondary to carpal tunnel. Reports mild discomfort and swelling - continuing today. No worsening. No self treatment. No open wounds reported.  ROS: As per HPI.  OBJECTIVE:  Vitals:   09/07/16 1149  BP: (!) 127/92  Pulse: 69  Resp: 20  Temp: 98.5 F (36.9 C)  TempSrc: Oral  SpO2: 100%     General appearance: alert; no distress Extremities:R 3rd finger with some distal bruising; slight distal swelling; has baseline decreased ROM in this finger; no open wounds MSK: symmetrical with no gross deformities Skin: baseline nail deformity of R 3rd finger Neurologic: baseline decreased sensation of R 3rd finger   Allergies  Allergen Reactions  . Hydromorphone Hcl Anaphylaxis and Hives  . Meperidine Hcl Anaphylaxis and Hives    REACTION: ?anaphylaxis  . Morphine Anaphylaxis and Hives    Anything in this family REACTION: ?anaphyaxis  . Lisinopril Cough  . Sumatriptan Other (See Comments)     Makes headaches worse.   . Tape Rash    EKG LEADS BLISTER THE PATIENT'S SKIN AND LEAVE RED, RAISED AREAS!!  . Zolmitriptan Other (See Comments)    Headache    PMHx, SurgHx, SocialHx, Medications, and Allergies were reviewed in the Visit Navigator and updated as appropriate.      Mardella LaymanHagler, Eriyanna Kofoed, MD 09/08/16 1800

## 2016-09-07 NOTE — Discharge Instructions (Addendum)
Today you were diagnosed with the following: 1. Crushing injury of finger, initial encounter    Your x-ray showed no finger fracture.  You have not been prescribed prescription medications this visit.  You may use over the counter ibuprofen or acetaminophen as needed.   If you are not improving over the next few days or feel you are worsening please follow up here or the Emergency Department if you are unable to see your regular doctor.

## 2016-09-07 NOTE — ED Triage Notes (Signed)
Pt here for right middle finger inj onset last night  Reports she caught her finger in the crease of the garage door while closing it.  Reports she has no feelings of the finger due to carpal tunnel  Sx today include swelling and bruising and warmth  A&O x4... NAD... Ambulatory

## 2016-09-25 ENCOUNTER — Other Ambulatory Visit: Payer: Self-pay | Admitting: *Deleted

## 2016-09-26 MED ORDER — VITAMIN D (ERGOCALCIFEROL) 1.25 MG (50000 UNIT) PO CAPS: 50000.0000 [IU] | ORAL_CAPSULE | ORAL | 0 refills | Status: DC

## 2016-11-16 ENCOUNTER — Other Ambulatory Visit: Payer: Self-pay | Admitting: *Deleted

## 2016-11-16 MED ORDER — INSULIN GLARGINE 100 UNIT/ML SOLOSTAR PEN: 40.0000 [IU] | PEN_INJECTOR | Freq: Every day | SUBCUTANEOUS | 0 refills | Status: DC

## 2016-12-05 ENCOUNTER — Other Ambulatory Visit: Payer: Self-pay | Admitting: *Deleted

## 2016-12-05 MED ORDER — INSULIN GLARGINE 100 UNIT/ML SOLOSTAR PEN: 40.0000 [IU] | PEN_INJECTOR | Freq: Every day | SUBCUTANEOUS | 0 refills | Status: DC

## 2016-12-18 ENCOUNTER — Ambulatory Visit (INDEPENDENT_AMBULATORY_CARE_PROVIDER_SITE_OTHER): Payer: Self-pay | Admitting: Internal Medicine

## 2016-12-18 ENCOUNTER — Encounter: Payer: Self-pay | Admitting: Internal Medicine

## 2016-12-18 VITALS — BP 122/78 | HR 92 | Temp 98.0°F | Ht 68.0 in | Wt 312.0 lb

## 2016-12-18 DIAGNOSIS — E1165 Type 2 diabetes mellitus with hyperglycemia: Secondary | ICD-10-CM

## 2016-12-18 DIAGNOSIS — E119 Type 2 diabetes mellitus without complications: Secondary | ICD-10-CM

## 2016-12-18 DIAGNOSIS — Z794 Long term (current) use of insulin: Secondary | ICD-10-CM

## 2016-12-18 LAB — POCT GLYCOSYLATED HEMOGLOBIN (HGB A1C): Hemoglobin A1C: 10.3

## 2016-12-18 NOTE — Assessment & Plan Note (Signed)
A1C elevated to 10.3 today from 9.3 on 08/07/2016. Patient with blood sugars typically in 200-300s per glucometer and patient report. As patient with recent weight gain, feel that diet likely playing role in worsening A1C and persistent elevated blood sugars. Offered to schedule appt with Dr. Gerilyn Pilgrim for patient, however she says she knows what she needs to do and does not need to speak with a nutritionist. Lowest blood sugar 195, so some room to increase insulin without great concern for hypoglycemia. Will increase to 43U today, but suspect will need to increase further. Continue metformin  qd. Scheduled appt with Dr. Raymondo Band on Oct 29 per patient request. Encouraged her to continue checking blood sugar and bring glucometer to this appointment.

## 2016-12-18 NOTE — Progress Notes (Signed)
   Subjective:   Patient: [name] [name]       Birthdate: 1976/01/21       MRN: [id number]      HPI  [name] [name] is a 41 y.o. female presenting for same day appt for hyperglycemia.   Hyperglycemia Patient reports very high blood sugars recently. She has been taking her medication as prescribed and making dietary changes, but says blood sugars are still consistently very high. Currently prescribed metformin  qhs and Lantus 40U qd. Has been eating more vegetables and decreasing carbs. Has also noticed that she is gaining weight, and had been losing in the past. Checks blood sugar at least twice daily. AM fasting is normally in 200s. Lowest has been 195, highest 428 (yesterday). Denies any symptoms of hyper- or hypoglycemia, including increased thirst, increased urination, diaphoresis, feeling shaky, etc. Is very frustrated that she is taking her meds and changing her diet as she should and is still having high blood sugars.   Smoking status reviewed. Patient is never smoker.   Review of Systems See HPI.     Objective:  Physical Exam  Constitutional: She is oriented to person, place, and time.  Obese female in NAD  HENT:  Head: Normocephalic and atraumatic.  Eyes: Conjunctivae and EOM are normal. Right eye exhibits no discharge. Left eye exhibits no discharge.  Pulmonary/Chest: Effort normal. No respiratory distress.  Neurological: She is alert and oriented to person, place, and time.  Skin: Skin is warm and dry.  Psychiatric: Affect and judgment normal.   Assessment & Plan:  Diabetes mellitus with hyperglycemia (HCC) A1C elevated to 10.3 today from 9.3 on 08/07/2016. Patient with blood sugars typically in 200-300s per glucometer and patient report. As patient with recent weight gain, feel that diet likely playing role in worsening A1C and persistent elevated blood sugars. Offered to schedule appt with Dr. Gerilyn Pilgrim for patient, however she says she knows what she needs to do and  does not need to speak with a nutritionist. Lowest blood sugar 195, so some room to increase insulin without great concern for hypoglycemia. Will increase to 43U today, but suspect will need to increase further. Continue metformin  qd. Scheduled appt with Dr. Raymondo Band on Oct 29 per patient request. Encouraged her to continue checking blood sugar and bring glucometer to this appointment.    Tarri Abernethy, MD, MPH PGY-3 [facility] Pager [phone]

## 2016-12-18 NOTE — Patient Instructions (Addendum)
It was nice meeting you today [name] [name]!  Please increase your Lantus to 43 units per day. Continue to take your metformin as you have been.   Your appointment with Dr. Raymondo Band is on Monday, October 29 at 8:30 AM. Please be sure to bring your glucometer to this appointment.   If you have any questions or concerns, please feel free to call the clinic.   Be well,  Dr. Natale Milch

## 2016-12-29 ENCOUNTER — Encounter: Payer: Self-pay | Admitting: *Deleted

## 2016-12-29 ENCOUNTER — Other Ambulatory Visit: Payer: Self-pay | Admitting: Internal Medicine

## 2016-12-29 MED ORDER — DULOXETINE HCL 20 MG PO CPEP: 40.0000 mg | ORAL_CAPSULE | Freq: Every day | ORAL | 0 refills | Status: DC

## 2016-12-29 NOTE — Telephone Encounter (Signed)
Refilled Cymbalta for 1 month. Patient needs to make an appointment to be seen, please let them know.  Thanks  Keyauna Graefe

## 2017-01-01 ENCOUNTER — Ambulatory Visit (INDEPENDENT_AMBULATORY_CARE_PROVIDER_SITE_OTHER): Payer: Self-pay | Admitting: Pharmacist

## 2017-01-01 ENCOUNTER — Encounter: Payer: Self-pay | Admitting: Pharmacist

## 2017-01-01 DIAGNOSIS — Z794 Long term (current) use of insulin: Secondary | ICD-10-CM

## 2017-01-01 DIAGNOSIS — K449 Diaphragmatic hernia without obstruction or gangrene: Secondary | ICD-10-CM

## 2017-01-01 DIAGNOSIS — E559 Vitamin D deficiency, unspecified: Secondary | ICD-10-CM

## 2017-01-01 DIAGNOSIS — E11628 Type 2 diabetes mellitus with other skin complications: Secondary | ICD-10-CM

## 2017-01-01 MED ORDER — METOCLOPRAMIDE HCL 5 MG PO TABS: 5.0000 mg | ORAL_TABLET | Freq: Three times a day (TID) | ORAL | 0 refills | Status: DC

## 2017-01-01 MED ORDER — VITAMIN D (ERGOCALCIFEROL) 1.25 MG (50000 UNIT) PO CAPS: 50000.0000 [IU] | ORAL_CAPSULE | ORAL | 0 refills | Status: DC

## 2017-01-01 MED ORDER — INSULIN GLARGINE 100 UNIT/ML SOLOSTAR PEN: 43.0000 [IU] | PEN_INJECTOR | Freq: Every day | SUBCUTANEOUS | 11 refills | Status: DC

## 2017-01-01 NOTE — Assessment & Plan Note (Signed)
Hx of Vitamin D Deficiency, lost supply of previous Vitamin D after taking 1 dose.  New prescriptions were sent to her pharmacy for her to continue taking her Vitamin D 50,000 units once weekly and her Lantus 43 units daily. Patient was advised to see Dr. Cathlean CowerMikell within 2 weeks to follow-up with the stomach problem.

## 2017-01-01 NOTE — Assessment & Plan Note (Signed)
Diabetes longstanding currently uncontrolled possibly worsened due to abdominal pain/discomfort. Patient denies hypoglycemic events and is able to verbalize appropriate hypoglycemia management plan. Patient reports adherence with medication. Control is suboptimal due to stomach pain/discomfort, nausea, bloating, and cramping. At her last visit on 12/18/16, patient's Lantus was increased from 40 units daily to 43 units daily and her metformin XR 1000 mg QHS was continued. No changes were made to the patient's diabetes medications at this time due to uncontrolled pain. Next A1C anticipated 03/2017.

## 2017-01-01 NOTE — Assessment & Plan Note (Signed)
Worsened chronic stomach pain/discomfort, nausea, bloating, and cramping since switching from venlafaxine to duloxetine. Chronic GI symptoms were reported for 2 years per patient and history of hiatal hernia was documented in 2008. Initiated metoclopramide 5 mg TID before meals following discussion with Dr. Lum BabeEniola and made PCP - Dr. Cathlean CowerMikell aware.

## 2017-01-01 NOTE — Progress Notes (Signed)
Patient ID: [name] PatrickRhonda N [name], female   DOB: 01/28/1976, 41 y.o.   MRN: [id number] Reviewed: Agree with Dr. Macky LowerKoval's documentation and management.

## 2017-01-01 NOTE — Patient Instructions (Addendum)
Thanks for coming in to see [name] [name] today!  We sent in a prescription for Reglan (metoclopramide) to help with your nausea. It's 5 mg three times a day with meals. We also sent in an updated Lantus prescription and another vitamin D prescription to your pharmacy as well.  No changes were made to your diabetes medications today. Keep track of your sugars and come back to see [name] [name] within a month if they're still >200.   Come back to see Dr. Cathlean CowerMikell within 2 weeks.

## 2017-01-01 NOTE — Progress Notes (Addendum)
S:    Chief Complaint  Patient presents with  . Medication Management    diabetes   Patient arrives in apparent distress with cramps/bloating and stomach pain, ambulating without assistance. Presents for diabetes evaluation, education, and management at the request of Dr. Natale Milch. Patient was referred on 12/18/16.   Patient reports Diabetes was diagnosed in 2012-2013.   Patient reports adherence with medications.  Current diabetes medications include: Lantus 43 units daily, metformin XR 1000 mg QHS  Patient denies hypoglycemic events. Patient reported the lowest her BG has been is 127.  Patient reported dietary habits: Eats 2 meals/day Lunch & Dinner: Kale salad, peppers, veggies, fruit (more greens/fruits and less carbs) Drinks: Water, coffee (every other day when she is excessively tired) Patient reports she has not been eating much recently due to excessive stomach pain and feelings of nausea recently. Patient endorses gaining weight.   Patient reports having stomach pains/nausea for the past 2 years, however, it has been noticeably worse recently. Patient believes it may be because of her recent switch from venlafaxine XR to duloxetine. Patient was clearly uncomfortable throughout the visit today.  O:  Physical Exam  Constitutional: She appears well-developed and well-nourished.    Review of Systems  Gastrointestinal: Positive for abdominal pain and nausea. Negative for vomiting.  All other systems reviewed and are negative.  Lab Results  Component Value Date   HGBA1C 10.3 12/18/2016   Vitals:   01/01/17 0840  BP: (!) 146/86    Home fasting CBG: 200-300s (patient-reported)  2 hour post-prandial/random CBG: 200-400s (patient-reported).  10 year ASCVD risk: 3.8%. Patient reports not taking her pitavastatin 2 mg QHS due to not wanting to take so many medications.  A/P: Diabetes longstanding currently uncontrolled possibly worsened due to abdominal  pain/discomfort. Patient denies hypoglycemic events and is able to verbalize appropriate hypoglycemia management plan. Patient reports adherence with medication. Control is suboptimal due to stomach pain/discomfort, nausea, bloating, and cramping. At her last visit on 12/18/16, patient's Lantus was increased from 40 units daily to 43 units daily and her metformin XR 1000 mg QHS was continued. No changes were made to the patient's diabetes medications at this time due to uncontrolled pain. Next A1C anticipated 03/2017.    Worsened chronic stomach pain/discomfort, nausea, bloating, and cramping since switching from venlafaxine to duloxetine. Chronic GI symptoms were reported for 2 years per patient and history of hiatal hernia was documented in 2008. Initiated metoclopramide 5 mg TID before meals following discussion with Dr. Lum Babe and made PCP - Dr. Cathlean Cower aware.    Elevated BP in office today, possibly related to pain.  Patient is not currently taking any hypertension medication. Reevaluate at next visit.   Hx of Vitamin D Deficiency, lost supply of previous Vitamin D after taking 1 dose.  New prescriptions were sent to her pharmacy for her to continue taking her Vitamin D 50,000 units once weekly and her Lantus 43 units daily. Patient was advised to see Dr. Cathlean Cower within 2 weeks to follow-up with the stomach problem.   Written patient instructions provided.  Total time in face to face counseling 15 minutes.   Follow up in Pharmacist Clinic Visit within a month if BG continue to be >200.   Patient seen with Rip Harbour, PharmD Candidate.   After FAX script on 10/29 and phone call on 11/1 from Garen Grams, patient reported Rx not yet received this AM.  [facility] and follwed with  phone call - left message on answering machine.   New Rx metoclopramide 5mg  TID with meals #90 no refills.

## 2017-01-04 ENCOUNTER — Telehealth: Payer: Self-pay | Admitting: *Deleted

## 2017-01-04 NOTE — Telephone Encounter (Signed)
Patient left message on nurse line stating that [facility] for reglan. Rx called into pharmacy.

## 2017-01-08 MED ORDER — METOCLOPRAMIDE HCL 5 MG PO TABS: 5.0000 mg | ORAL_TABLET | Freq: Three times a day (TID) | ORAL | 0 refills | Status: DC

## 2017-01-08 NOTE — Addendum Note (Signed)
Addended by: Kathrin RuddyKOVAL, Roselynn Whitacre G on: 01/08/2017 10:18 AM   Modules accepted: Orders

## 2017-01-12 ENCOUNTER — Other Ambulatory Visit (HOSPITAL_COMMUNITY)
Admission: RE | Admit: 2017-01-12 | Discharge: 2017-01-12 | Disposition: A | Discharge status: Home / Self Care | Payer: Self-pay | Source: Ambulatory Visit | Attending: Family Medicine | Admitting: Family Medicine

## 2017-01-12 ENCOUNTER — Ambulatory Visit (INDEPENDENT_AMBULATORY_CARE_PROVIDER_SITE_OTHER): Payer: Self-pay | Admitting: Internal Medicine

## 2017-01-12 ENCOUNTER — Other Ambulatory Visit: Payer: Self-pay

## 2017-01-12 ENCOUNTER — Encounter: Payer: Self-pay | Admitting: Internal Medicine

## 2017-01-12 VITALS — BP 138/80 | HR 74 | Temp 98.1°F | Wt 310.0 lb

## 2017-01-12 DIAGNOSIS — R1010 Upper abdominal pain, unspecified: Secondary | ICD-10-CM | POA: Insufficient documentation

## 2017-01-12 DIAGNOSIS — N898 Other specified noninflammatory disorders of vagina: Secondary | ICD-10-CM | POA: Insufficient documentation

## 2017-01-12 DIAGNOSIS — E1165 Type 2 diabetes mellitus with hyperglycemia: Secondary | ICD-10-CM

## 2017-01-12 DIAGNOSIS — R109 Unspecified abdominal pain: Secondary | ICD-10-CM

## 2017-01-12 DIAGNOSIS — Z794 Long term (current) use of insulin: Secondary | ICD-10-CM

## 2017-01-12 LAB — POCT WET PREP (WET MOUNT)
Clue Cells Wet Prep Whiff POC: NEGATIVE
TRICHOMONAS WET PREP HPF POC: ABSENT

## 2017-01-12 LAB — POCT H PYLORI SCREEN: H Pylori Screen, POC: NEGATIVE

## 2017-01-12 MED ORDER — FLUCONAZOLE 150 MG PO TABS: 150.0000 mg | ORAL_TABLET | Freq: Once | ORAL | 0 refills | Status: DC

## 2017-01-12 MED ORDER — ONDANSETRON 4 MG PO TBDP: 4.0000 mg | ORAL_TABLET | Freq: Three times a day (TID) | ORAL | 0 refills | Status: DC | PRN

## 2017-01-12 MED ORDER — FLUCONAZOLE 150 MG PO TABS: 150.0000 mg | ORAL_TABLET | Freq: Once | ORAL | 0 refills | Status: AC

## 2017-01-12 NOTE — Assessment & Plan Note (Signed)
Likely yeast infection -Obtain self swab wet prep and GC probe -Provide Diflucan x2, if patient does not improve after 48 hours with 1 Diflucan we will provide her a second pill to take

## 2017-01-12 NOTE — Assessment & Plan Note (Addendum)
Overall well-appearing lady with chronic abdominal pain possibly due to  diabetic gastroparesis.  Patient was prescribed metoclopramide but refuses to take this.  I discussed that this is the best medication for patient and reassured her that she is unlikely to be at tardive dyskinesia.  -Patient is not interested in metoclopramide -Will provide her with Zofran for nausea and try to help with hyperglycemia -Will check H. pylori to make sure she does not have some type of ulcer contributing to her symptoms.

## 2017-01-12 NOTE — Patient Instructions (Signed)
It was nice meeting you today.  We have a a lot to work on.  I want you to increase your to 53 units.  If your blood sugars are not below 200 on 53 units you can increase by another 5 units the following week.  I will provide you with some Zofran to help with the nausea.  If you take Zofran do not take the metoclopramide together as they work similarly.  I am going to check you for H. pylori.  I will provide you Diflucan for your yeast infection, if the first pill does not work you can take another pill in 48 hours.  Please make sure to follow-up with me in the next 2 weeks.  Make sure to continue checking your blood sugars and write them down in a journal.

## 2017-01-12 NOTE — Assessment & Plan Note (Addendum)
Patient with recent sugar binge per her.  She is trying to get back on track.  Her last A1c 10 -Has been increasing her Lantus 35> 43; will now increase it to 53 this week The patient's morning blood sugars are not below 200-will increase to 58 units of insulin - follow-up in 2 weeks -Continue metformin -If blood sugar still not well controlled patient is interested in NovoLog, not interested in oral medication. -Needs to follow-up for her eye exam -Foot exam performed today -Not interested in pneumonia shot

## 2017-01-12 NOTE — Progress Notes (Addendum)
[facility] Noralee CharsAsiyah Dartanion Teo, MD Phone: [phone]  Reason For Visit: follow up  # CHRONIC DM, Type 2: Checking blood sugars three times daily did not bring; the correct blood sugar monitor today CBGs: checks them 3 times daily - CBGs 230- 400, Avg 300  Meds: Lantus  43 units in the AM  And Metformin 1000 mg, was on Lantus 35 previously Reports good compliance. Tolerating well w/o side-effects Lifestyle: Salads and protein shakes; apples; patient has been having a "sugar fest" in August  Any hypoglycemia episodes: Denies palpations, diaphoresis, fatigue, weakness, jittery Endorses increased polyuria and visual changes  denies numbness or tingling.  # Stomach Pain/Nausea   -Has been ongoing for about 2 years - Intermittent stomach cramping, epigastric, last for hours, states that she has this every day - Nothing helps relieve it  - No burning sensation in the esophagus - Not associated with food either after eating or before eating   - Feels nauseated, does not want to eat at times - Denies have to throw up  - Has 1-2 bowel movements daily; Semi-solid and soft stools  - Has not tried metoclopramide, patient has read the warning label on the medication concerning tardive dyskinesia is not interested in taking that medication. -No fevers or chills  #Yeast infection  -She denies any states she gets yeast infections when her blood sugars are not well controlled -She is usually able to eat yogurt and take a herbal capsule and resolve her yeast infection -She has not been sexually active since January -She states that she is having white curdy-like discharge -This is similar to her previous yeast infections  Past Medical History Reviewed problem list.  Medications- reviewed and updated No additions to family history Social history- patient is a non smoker  Objective: BP 138/80   Pulse 74   Temp 98.1 F (36.7 C) (Oral)   Wt (!) 310 lb (140.6 kg)   SpO2  98%   BMI 47.14 kg/m  Gen: NAD, alert, cooperative with exam Cardio: regular rate and rhythm, S1S2 heard, no murmurs appreciated Pulm: clear to auscultation bilaterally, no wheezes, rhonchi or rales GI: soft, non-tender, non-distended, bowel sounds present, no hepatomegaly, no splenomegaly Skin: dry, intact, no rashes or lesions Diabetic Foot Exam - Simple   Simple Foot Form Diabetic Foot exam was performed with the following findings:  Yes 01/12/2017  8:36 AM  Visual Inspection No deformities, no ulcerations, no other skin breakdown bilaterally:  Yes Sensation Testing Intact to touch and monofilament testing bilaterally:  Yes Pulse Check Posterior Tibialis and Dorsalis pulse intact bilaterally:  Yes Comments      Assessment/Plan: See problem based a/p  Abdominal pain Overall well-appearing lady with chronic abdominal pain possibly due to  diabetic gastroparesis.  Patient was prescribed metoclopramide but refuses to take this.  I discussed that this is the best medication for patient and reassured her that she is unlikely to be at tardive dyskinesia.  -Patient is not interested in metoclopramide -Will provide her with Zofran for nausea and try to help with hyperglycemia -Will check H. pylori to make sure she does not have some type of ulcer contributing to her symptoms.  Diabetes mellitus with hyperglycemia (HCC) Patient with recent sugar binge per her.  She is trying to get back on track.  Her last A1c 10 -Has been increasing her Lantus 35> 43; will now increase it to 53 this week The patient's morning blood sugars are not below 200-will increase to  58 units of insulin - follow-up in 2 weeks -Continue metformin -If blood sugar still not well controlled patient is interested in NovoLog, not interested in oral medication. -Needs to follow-up for her eye exam -Foot exam performed today -Not interested in pneumonia shot  Vaginal discharge Likely yeast infection -Obtain self  swab wet prep and GC probe -Provide Diflucan x2, if patient does not improve after 48 hours with 1 Diflucan we will provide her a second pill to take

## 2017-01-15 LAB — CERVICOVAGINAL ANCILLARY ONLY
CHLAMYDIA, DNA PROBE: NEGATIVE
NEISSERIA GONORRHEA: NEGATIVE

## 2017-01-16 ENCOUNTER — Encounter: Payer: Self-pay | Admitting: Internal Medicine

## 2017-01-16 ENCOUNTER — Other Ambulatory Visit: Payer: Self-pay | Admitting: Internal Medicine

## 2017-01-16 MED ORDER — INSULIN GLARGINE 100 UNIT/ML SOLOSTAR PEN: 53.0000 [IU] | PEN_INJECTOR | Freq: Every day | SUBCUTANEOUS | 11 refills | Status: DC

## 2017-01-29 ENCOUNTER — Telehealth: Payer: Self-pay | Admitting: *Deleted

## 2017-01-29 NOTE — Telephone Encounter (Signed)
Dr Ledon SnareMcKnight calling to speak with dr Cathlean Cowermikell about mutual pt. He says he wants pt to be on these meds (Lamictal 200mg /Effexor XR 150mg ) because she is stable on them. Please call him back if you have any questions. He was informed that pt needed to come in for an appt for new meds, pt has appt on 11/30 with you. [name] [name], CMA

## 2017-01-31 ENCOUNTER — Telehealth: Payer: Self-pay | Admitting: *Deleted

## 2017-01-31 NOTE — Telephone Encounter (Signed)
Please let Dr. Ledon SnareMcKnight know that it would be inappropriate for me to refill these medications. Given patient has Bipolar disorder and is on mood stabilizers her psychiatrist should follow her for this.

## 2017-01-31 NOTE — Telephone Encounter (Signed)
-----   Message from Asiyah Mayra ReelZahra Mikell, MD sent at 01/31/2017  2:34 PM EST ----- I dont have any contact information. What is the physician's contact information.  He is not in the chart or the care team.   ----- Message ----- From: Pamelia HoitBlount, Deseree C, CMA Sent: 01/31/2017   2:32 PM To: Berton BonAsiyah Zahra Mikell, MD  Per dr Leveda Annahensel the message sent on this pt, should be a physician to physician conversation. Deseree Bruna PotterBlount, CMA

## 2017-02-01 NOTE — Telephone Encounter (Signed)
Attempted to call Dr. Ledon SnareMcKnight contact number which went to an answering machine. Provided personal cellphone number and page for call back.   Asiyah

## 2017-02-02 ENCOUNTER — Other Ambulatory Visit: Payer: Self-pay

## 2017-02-02 ENCOUNTER — Ambulatory Visit (INDEPENDENT_AMBULATORY_CARE_PROVIDER_SITE_OTHER): Payer: Self-pay | Admitting: Internal Medicine

## 2017-02-02 ENCOUNTER — Encounter: Payer: Self-pay | Admitting: Internal Medicine

## 2017-02-02 VITALS — BP 120/88 | HR 85 | Temp 98.6°F | Ht 68.0 in | Wt 325.2 lb

## 2017-02-02 DIAGNOSIS — F319 Bipolar disorder, unspecified: Secondary | ICD-10-CM

## 2017-02-02 DIAGNOSIS — Z794 Long term (current) use of insulin: Secondary | ICD-10-CM

## 2017-02-02 DIAGNOSIS — E11628 Type 2 diabetes mellitus with other skin complications: Secondary | ICD-10-CM

## 2017-02-02 DIAGNOSIS — E1165 Type 2 diabetes mellitus with hyperglycemia: Secondary | ICD-10-CM

## 2017-02-02 DIAGNOSIS — K5900 Constipation, unspecified: Secondary | ICD-10-CM

## 2017-02-02 MED ORDER — VENLAFAXINE HCL ER 150 MG PO CP24: 150.0000 mg | ORAL_CAPSULE | Freq: Every day | ORAL | 3 refills | Status: DC

## 2017-02-02 MED ORDER — DOCUSATE SODIUM 100 MG PO CAPS: 100.0000 mg | ORAL_CAPSULE | Freq: Two times a day (BID) | ORAL | 0 refills | Status: DC

## 2017-02-02 MED ORDER — INSULIN GLARGINE 100 UNIT/ML SOLOSTAR PEN: 60.0000 [IU] | PEN_INJECTOR | Freq: Every day | SUBCUTANEOUS | 11 refills | Status: DC

## 2017-02-02 MED ORDER — POLYETHYLENE GLYCOL 3350 17 GM/SCOOP PO POWD: 17.0000 g | Freq: Every day | ORAL | 1 refills | Status: DC | PRN

## 2017-02-02 MED ORDER — INSULIN ASPART 100 UNIT/ML FLEXPEN: PEN_INJECTOR | SUBCUTANEOUS | 11 refills | Status: DC

## 2017-02-02 MED ORDER — LAMOTRIGINE 25 MG PO TABS: ORAL_TABLET | ORAL | 0 refills | Status: DC

## 2017-02-02 NOTE — Patient Instructions (Addendum)
I am going to increase your Lantus to 60 units.  I am going to have you start NovoLog 5 units with meals please check your blood sugar before meals if it is greater than 200 then take 5 units of NovoLog if not do not take 5 units.  I have prescribed you some medications for your constipation please take MiraLAX and Colace every day for the next couple weeks.  I have restarted your Effexor.   Please follow the following regimen for your Lamictal, by week 6 you should be on 200 mg once daily Weeks 1 and 2: 25 mg once daily Weeks 3 and 4: 50 mg once daily Week 5: 100 mg once daily Week 6 and maintenance: 200 mg once daily

## 2017-02-02 NOTE — Progress Notes (Signed)
   [name] Noralee CharsAsiyah Omran Keelin, MD Phone: [phone]  Reason For Visit: Follow up T2DM,   # CHRONIC DM, Type 2 CBGs: Patient has been reading more in the 200s.   Meds: Lantus 53 in the AM, Metformin 1000 mg  Reports good compliance. Tolerating well w/o side-effects Lifestyle: She states that she does not eat anything and she is always nauseated-however she is upset because she continues to gain weight. Any hypoglycemia episodes: Denies palpations, diaphoresis, fatigue, weakness, jittery Denies polyuria, visual changes, numbness or tingling.  #Bipolar 1 Disorder  -Patient is followed by Dr. Ledon [name] a psychologist.  He has been following her for years.  Patient has been off her Lamictal and Effexor for a couple of months. She would like to restart these medications.  She states she has had [facility] and would like physical refills  #Constipation  -Indicates being constipated over the past week. has been having hard bowel movements.  She is not taking anything specifically for this.  Past Medical History Reviewed problem list.  Medications- reviewed and updated No additions to family history Social history- patient is a non-smoker  Objective: BP 120/88   Pulse 85   Temp 98.6 F (37 C) (Oral)   Ht 5\' 8"  (1.727 m)   Wt (!) 325 lb 3.2 oz (147.5 kg)   SpO2 98%   BMI 49.45 kg/m  Gen: NAD, alert, cooperative with exam Cardio: regular rate and rhythm, S1S2 heard, no murmurs appreciated Pulm: clear to auscultation bilaterally, no wheezes, rhonchi or rales GI: soft, non-tender, non-distended, bowel sounds present, no hepatomegaly, no splenomegaly Skin: dry, intact, no rashes or lesions  Assessment/Plan: See problem based a/p  Bipolar I disorder (HCC) Has been off Lamictal for over a month.  Discussed with Dr. Ledon [name] has been stable on this medication for several years and he would like me to  prescribe this medication as it is difficult for her to follow-up with a psychiatrist -Will restart Lamictal per up-to-date regiment below Weeks 1 and 2: 25 mg once daily Weeks 3 and 4: 50 mg once daily Week 5: 100 mg once daily Week 6 and maintenance: 200 mg once daily -We will restart Effexor 150 mg as before   Constipation - polyethylene glycol powder (GLYCOLAX/MIRALAX) powder; Take 17 g by mouth daily as needed.  Dispense: 3350 g; Refill: 1 - docusate sodium (COLACE) 100 MG capsule; Take 1 capsule (100 mg total) by mouth 2 (two) times daily.  Dispense: 30 capsule; Refill: 0    Diabetes mellitus with hyperglycemia (HCC) Morning blood sugar is elevated in the 200s, as well as  throughout the day -Will increase Lantus to 60 units -Continue metformin 1000 mg 24 hours - would increase 2000 mg in the near future when patient's stomach seems to be in better state - Will start patient on mealtime insulin-NovoLog 5 units 3 times daily with blood sugars above 200 -Follow-up in 2 weeks -A1c at next visit

## 2017-02-05 NOTE — Assessment & Plan Note (Signed)
-   polyethylene glycol powder (GLYCOLAX/MIRALAX) powder; Take 17 g by mouth daily as needed.  Dispense: 3350 g; Refill: 1 - docusate sodium (COLACE) 100 MG capsule; Take 1 capsule (100 mg total) by mouth 2 (two) times daily.  Dispense: 30 capsule; Refill: 0

## 2017-02-05 NOTE — Assessment & Plan Note (Signed)
Has been off Lamictal for over a month.  Discussed with Dr. Ledon SnareMcKnight has been stable on this medication for several years and he would like me to prescribe this medication as it is difficult for her to follow-up with a psychiatrist -Will restart Lamictal per up-to-date regiment below Weeks 1 and 2: 25 mg once daily Weeks 3 and 4: 50 mg once daily Week 5: 100 mg once daily Week 6 and maintenance: 200 mg once daily -We will restart Effexor 150 mg as before

## 2017-02-05 NOTE — Assessment & Plan Note (Signed)
Morning blood sugar is elevated in the 200s, as well as  throughout the day -Will increase Lantus to 60 units -Continue metformin 1000 mg 24 hours - would increase 2000 mg in the near future when patient's stomach seems to be in better state - Will start patient on mealtime insulin-NovoLog 5 units 3 times daily with blood sugars above 200 -Follow-up in 2 weeks -A1c at next visit

## 2017-02-09 ENCOUNTER — Telehealth: Payer: Self-pay | Admitting: Internal Medicine

## 2017-02-09 MED ORDER — INSULIN LISPRO 100 UNIT/ML (KWIKPEN): PEN_INJECTOR | SUBCUTANEOUS | 11 refills | Status: DC

## 2017-02-09 NOTE — Telephone Encounter (Signed)
Switched Novolog to Humalog. Will give prescription to patient

## 2017-02-13 ENCOUNTER — Other Ambulatory Visit: Payer: Self-pay | Admitting: Internal Medicine

## 2017-02-13 MED ORDER — INSULIN LISPRO 100 UNIT/ML (KWIKPEN): PEN_INJECTOR | SUBCUTANEOUS | 11 refills | Status: DC

## 2017-02-13 NOTE — Telephone Encounter (Signed)
[facility], clarified prescriptions. Patient should be able to pick up prescriptions without issue.

## 2017-04-03 ENCOUNTER — Other Ambulatory Visit: Payer: Self-pay | Admitting: *Deleted

## 2017-04-03 LAB — HM DIABETES EYE EXAM

## 2017-04-03 MED ORDER — FLUCONAZOLE 150 MG PO TABS: 150.0000 mg | ORAL_TABLET | Freq: Once | ORAL | 0 refills | Status: DC

## 2017-04-03 MED ORDER — LAMOTRIGINE 200 MG PO TABS: 200.0000 mg | ORAL_TABLET | Freq: Every day | ORAL | 0 refills | Status: DC

## 2017-04-03 NOTE — Telephone Encounter (Signed)
Verified Lamical dose with patient. Rx sent for lamictal and one dose of fluconazole as patient reports of vaginal yeast infection symptoms. Discussed that she should follow up if symptoms do not resolve with this.

## 2017-04-03 NOTE — Telephone Encounter (Signed)
Pt also wants fluconazole 150 mg tab, not seen on med list. Please advise. [name] [name] Bruna PotterBlount, CMA

## 2017-05-04 MED ORDER — FLUCONAZOLE 150 MG PO TABS: 150.0000 mg | ORAL_TABLET | Freq: Once | ORAL | 0 refills | Status: AC

## 2017-05-04 MED ORDER — LAMOTRIGINE 200 MG PO TABS: 200.0000 mg | ORAL_TABLET | Freq: Every day | ORAL | 0 refills | Status: DC

## 2017-05-04 NOTE — Telephone Encounter (Signed)
Resent scripts as they were never received by pharmacy.  [name] [name],CMA

## 2017-05-04 NOTE — Addendum Note (Signed)
Addended by: Henri MedalHARTSELL, Teira Arcilla M on: 05/04/2017 11:54 AM   Modules accepted: Orders

## 2017-05-11 ENCOUNTER — Encounter: Payer: Self-pay | Admitting: Internal Medicine

## 2017-05-18 ENCOUNTER — Ambulatory Visit (HOSPITAL_COMMUNITY)
Admission: EM | Admit: 2017-05-18 | Discharge: 2017-05-18 | Disposition: A | Discharge status: Home / Self Care | Payer: Self-pay | Attending: Family Medicine | Admitting: Family Medicine

## 2017-05-18 ENCOUNTER — Encounter (HOSPITAL_COMMUNITY): Payer: Self-pay | Admitting: Emergency Medicine

## 2017-05-18 ENCOUNTER — Other Ambulatory Visit: Payer: Self-pay

## 2017-05-18 DIAGNOSIS — M659 Synovitis and tenosynovitis, unspecified: Secondary | ICD-10-CM

## 2017-05-18 DIAGNOSIS — J020 Streptococcal pharyngitis: Secondary | ICD-10-CM

## 2017-05-18 DIAGNOSIS — R509 Fever, unspecified: Secondary | ICD-10-CM

## 2017-05-18 LAB — POCT RAPID STREP A: STREPTOCOCCUS, GROUP A SCREEN (DIRECT): POSITIVE — AB

## 2017-05-18 MED ORDER — AMOXICILLIN 875 MG PO TABS: 875.0000 mg | ORAL_TABLET | Freq: Two times a day (BID) | ORAL | 0 refills | Status: DC

## 2017-05-18 NOTE — ED Provider Notes (Signed)
[facility]   [id number] 05/18/17 Arrival Time: 1118   SUBJECTIVE:  [name] [name] is a 42 y.o. female who presents to the urgent care with complaint of fever of 103.5 last night.  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    Pt only reports fever and body aches at this time.  Patient also has several weeks of left distal radial tenderness unresponsive to NSAIDS and brace.  Past Medical History:  Diagnosis Date  . Allergy   . Anemia   . Bipolar 1 disorder [facility](HCC)    Sees Dr. Ledon SnareMcKnight, psychology,  [phone]  . Carpal tunnel syndrome   . Depression   . Diabetes mellitus without complication (HCC)   . Family history of adverse reaction to anesthesia    mother & son have difficulty waking  . Fatigue   . Sleep apnea    uses cpap   Family History  Problem Relation Age of Onset  . Hypertension Mother   . Diabetes Mellitus II Mother   . Congestive Heart Failure Father   . Hypertension Father   . Diabetes Mellitus II Father    Social History   Socioeconomic History  . Marital status: Single    Spouse name: Not on file  . Number of children: 3  . Years of education: Not on file  . Highest education level: Not on file  Social Needs  . Financial resource strain: Not on file  . Food insecurity - worry: Not on file  . Food insecurity - inability: Not on file  . Transportation needs - medical: Not on file  . Transportation needs - non-medical: Not on file  Occupational History  . Not on file  Tobacco Use  . Smoking status: Never Smoker  . Smokeless tobacco: Never Used  Substance and Sexual Activity  . Alcohol use: Yes    Alcohol/week: 0.0 oz    Comment: occ  . Drug use: No  . Sexual activity: Not on file  Other Topics Concern  . Not on file  Social History Narrative  . Not on file   Current Meds  Medication Sig  . Insulin Glargine (LANTUS SOLOSTAR) 100 UNIT/ML Solostar Pen Inject 60 Units into the skin daily.    . insulin lispro (HUMALOG) 100 UNIT/ML KiwkPen Take 5 units If blood sugar is above 200 before meal  . lamoTRIgine (LAMICTAL) 200 MG tablet Take 1 tablet (200 mg total) by mouth daily.  . metFORMIN (GLUMETZA) 1000 MG (MOD) 24 hr tablet Take 1 tablet (1,000 mg total) by mouth at bedtime.  Marland Kitchen. venlafaxine XR (EFFEXOR-XR) 150 MG 24 hr capsule Take 1 capsule (150 mg total) by mouth daily with breakfast.   Allergies  Allergen Reactions  . Hydromorphone Hcl Anaphylaxis and Hives  . Meperidine Hcl Anaphylaxis and Hives    REACTION: ?anaphylaxis  . Morphine Anaphylaxis and Hives    Anything in this family REACTION: ?anaphyaxis  . Lisinopril Cough  . Sumatriptan Other (See Comments)    Makes headaches worse.   . Tape Rash    EKG LEADS BLISTER THE PATIENT'S SKIN AND LEAVE RED, RAISED AREAS!!  . Zolmitriptan Other (See Comments)    Headache      ROS: As per HPI, remainder of ROS negative.   OBJECTIVE:   Vitals:   05/18/17 1220  BP: 133/79  Pulse: 91  Temp: 99.7 F (37.6 C)  TempSrc: Oral  SpO2: 100%     General appearance: alert;  no distress Eyes: PERRL; EOMI; conjunctiva normal HENT: normocephalic; atraumatic;  oral mucosa red pharynx Neck: supple Lungs: clear to auscultation bilaterally Heart: regular rate and rhythm Back: no CVA tenderness Extremities: no cyanosis or edema; symmetrical with no gross deformities;  Very tender distal radius Skin: warm and dry Neurologic: normal gait; grossly normal Psychological: alert and cooperative; normal mood and affect      Labs:  Results for orders placed or performed in visit on 05/11/17  HM DIABETES EYE EXAM  Result Value Ref Range   HM Diabetic Eye Exam No Retinopathy No Retinopathy    Labs Reviewed - No data to display  No results found.     ASSESSMENT & PLAN:  1. Fever, unspecified fever cause   2. Synovitis and tenosynovitis   Follow up with family practice for the left wrist pain (de Quervain's  synovitis)  No orders of the defined types were placed in this encounter.   Reviewed expectations re: course of current medical issues. Questions answered. Outlined signs and symptoms indicating need for more acute intervention. Patient verbalized understanding. After Visit Summary given.    Procedures:      Elvina Sidle, MD 05/18/17 1309

## 2017-05-18 NOTE — Discharge Instructions (Signed)
Follow up with family practice for the left wrist pain (de Quervain's synovitis)

## 2017-05-18 NOTE — ED Triage Notes (Signed)
Pt reports a fever of 103.5 last night.  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    Pt only reports fever and body aches at this time.

## 2017-05-24 ENCOUNTER — Other Ambulatory Visit: Payer: Self-pay

## 2017-05-24 DIAGNOSIS — Z794 Long term (current) use of insulin: Principal | ICD-10-CM

## 2017-05-24 DIAGNOSIS — E11628 Type 2 diabetes mellitus with other skin complications: Secondary | ICD-10-CM

## 2017-05-24 MED ORDER — INSULIN GLARGINE 100 UNIT/ML SOLOSTAR PEN: 60.0000 [IU] | PEN_INJECTOR | Freq: Every day | SUBCUTANEOUS | 11 refills | Status: DC

## 2017-05-24 NOTE — Telephone Encounter (Signed)
[facility] does e prescribing now. Zeenat Jeanbaptiste, RN ([facility] RN)  

## 2017-06-06 ENCOUNTER — Other Ambulatory Visit: Payer: Self-pay

## 2017-06-08 MED ORDER — LAMOTRIGINE 200 MG PO TABS: 200.0000 mg | ORAL_TABLET | Freq: Every day | ORAL | 0 refills | Status: DC

## 2017-06-08 NOTE — Telephone Encounter (Signed)
Please let patient know she needs to follow up for continued refills. Will refill her medications today

## 2017-06-21 ENCOUNTER — Inpatient Hospital Stay (HOSPITAL_COMMUNITY)
Admission: EM | Admit: 2017-06-21 | Discharge: 2017-06-24 | DRG: 418 | Disposition: A | Discharge status: Home / Self Care | Payer: Medicaid Other | Attending: Family Medicine | Admitting: Family Medicine

## 2017-06-21 ENCOUNTER — Emergency Department (HOSPITAL_COMMUNITY): Payer: Medicaid Other

## 2017-06-21 ENCOUNTER — Inpatient Hospital Stay (HOSPITAL_COMMUNITY): Payer: Medicaid Other

## 2017-06-21 ENCOUNTER — Other Ambulatory Visit: Payer: Self-pay

## 2017-06-21 ENCOUNTER — Encounter (HOSPITAL_COMMUNITY): Payer: Self-pay

## 2017-06-21 DIAGNOSIS — Z888 Allergy status to other drugs, medicaments and biological substances status: Secondary | ICD-10-CM

## 2017-06-21 DIAGNOSIS — E559 Vitamin D deficiency, unspecified: Secondary | ICD-10-CM | POA: Diagnosis not present

## 2017-06-21 DIAGNOSIS — R1010 Upper abdominal pain, unspecified: Secondary | ICD-10-CM

## 2017-06-21 DIAGNOSIS — G4733 Obstructive sleep apnea (adult) (pediatric): Secondary | ICD-10-CM | POA: Diagnosis present

## 2017-06-21 DIAGNOSIS — D509 Iron deficiency anemia, unspecified: Secondary | ICD-10-CM | POA: Diagnosis present

## 2017-06-21 DIAGNOSIS — E1140 Type 2 diabetes mellitus with diabetic neuropathy, unspecified: Secondary | ICD-10-CM

## 2017-06-21 DIAGNOSIS — E876 Hypokalemia: Secondary | ICD-10-CM | POA: Diagnosis not present

## 2017-06-21 DIAGNOSIS — I10 Essential (primary) hypertension: Secondary | ICD-10-CM | POA: Diagnosis present

## 2017-06-21 DIAGNOSIS — K8012 Calculus of gallbladder with acute and chronic cholecystitis without obstruction: Principal | ICD-10-CM | POA: Diagnosis present

## 2017-06-21 DIAGNOSIS — E785 Hyperlipidemia, unspecified: Secondary | ICD-10-CM | POA: Diagnosis present

## 2017-06-21 DIAGNOSIS — Z90710 Acquired absence of both cervix and uterus: Secondary | ICD-10-CM | POA: Diagnosis not present

## 2017-06-21 DIAGNOSIS — E119 Type 2 diabetes mellitus without complications: Secondary | ICD-10-CM

## 2017-06-21 DIAGNOSIS — R1011 Right upper quadrant pain: Secondary | ICD-10-CM | POA: Diagnosis not present

## 2017-06-21 DIAGNOSIS — D649 Anemia, unspecified: Secondary | ICD-10-CM | POA: Diagnosis not present

## 2017-06-21 DIAGNOSIS — Z885 Allergy status to narcotic agent status: Secondary | ICD-10-CM

## 2017-06-21 DIAGNOSIS — G43909 Migraine, unspecified, not intractable, without status migrainosus: Secondary | ICD-10-CM | POA: Diagnosis not present

## 2017-06-21 DIAGNOSIS — Z6841 Body Mass Index (BMI) 40.0 and over, adult: Secondary | ICD-10-CM | POA: Diagnosis not present

## 2017-06-21 DIAGNOSIS — K819 Cholecystitis, unspecified: Secondary | ICD-10-CM | POA: Diagnosis present

## 2017-06-21 DIAGNOSIS — F319 Bipolar disorder, unspecified: Secondary | ICD-10-CM | POA: Diagnosis not present

## 2017-06-21 DIAGNOSIS — Z8711 Personal history of peptic ulcer disease: Secondary | ICD-10-CM | POA: Diagnosis not present

## 2017-06-21 DIAGNOSIS — Z91048 Other nonmedicinal substance allergy status: Secondary | ICD-10-CM | POA: Diagnosis not present

## 2017-06-21 DIAGNOSIS — K449 Diaphragmatic hernia without obstruction or gangrene: Secondary | ICD-10-CM | POA: Diagnosis not present

## 2017-06-21 DIAGNOSIS — Z794 Long term (current) use of insulin: Secondary | ICD-10-CM | POA: Diagnosis not present

## 2017-06-21 DIAGNOSIS — Z8249 Family history of ischemic heart disease and other diseases of the circulatory system: Secondary | ICD-10-CM | POA: Diagnosis not present

## 2017-06-21 DIAGNOSIS — K8020 Calculus of gallbladder without cholecystitis without obstruction: Secondary | ICD-10-CM | POA: Insufficient documentation

## 2017-06-21 DIAGNOSIS — L301 Dyshidrosis [pompholyx]: Secondary | ICD-10-CM | POA: Diagnosis not present

## 2017-06-21 DIAGNOSIS — Z833 Family history of diabetes mellitus: Secondary | ICD-10-CM | POA: Diagnosis not present

## 2017-06-21 DIAGNOSIS — E7800 Pure hypercholesterolemia, unspecified: Secondary | ICD-10-CM | POA: Diagnosis present

## 2017-06-21 DIAGNOSIS — K760 Fatty (change of) liver, not elsewhere classified: Secondary | ICD-10-CM | POA: Diagnosis present

## 2017-06-21 LAB — I-STAT TROPONIN, ED: Troponin i, poc: 0 ng/mL (ref 0.00–0.08)

## 2017-06-21 LAB — URINALYSIS, ROUTINE W REFLEX MICROSCOPIC
BACTERIA UA: NONE SEEN
BILIRUBIN URINE: NEGATIVE
Glucose, UA: NEGATIVE mg/dL
HGB URINE DIPSTICK: NEGATIVE
KETONES UR: NEGATIVE mg/dL
Leukocytes, UA: NEGATIVE
NITRITE: NEGATIVE
PROTEIN: 30 mg/dL — AB
Specific Gravity, Urine: 1.024 (ref 1.005–1.030)
pH: 5 (ref 5.0–8.0)

## 2017-06-21 LAB — COMPREHENSIVE METABOLIC PANEL
ALBUMIN: 3.6 g/dL (ref 3.5–5.0)
ALT: 17 U/L (ref 14–54)
AST: 17 U/L (ref 15–41)
Alkaline Phosphatase: 85 U/L (ref 38–126)
Anion gap: 11 (ref 5–15)
BILIRUBIN TOTAL: 0.7 mg/dL (ref 0.3–1.2)
BUN: 7 mg/dL (ref 6–20)
CALCIUM: 9.6 mg/dL (ref 8.9–10.3)
CO2: 24 mmol/L (ref 22–32)
Chloride: 106 mmol/L (ref 101–111)
Creatinine, Ser: 0.68 mg/dL (ref 0.44–1.00)
GFR calc non Af Amer: >60 mL/min (ref >60)
GLUCOSE: 204 mg/dL — AB (ref 65–99)
POTASSIUM: 4 mmol/L (ref 3.5–5.1)
SODIUM: 141 mmol/L (ref 135–145)
TOTAL PROTEIN: 7.3 g/dL (ref 6.5–8.1)

## 2017-06-21 LAB — CBC
HEMATOCRIT: 38.3 % (ref 36.0–46.0)
HEMOGLOBIN: 12.4 g/dL (ref 12.0–15.0)
MCH: 28.1 pg (ref 26.0–34.0)
MCHC: 32.4 g/dL (ref 30.0–36.0)
MCV: 86.7 fL (ref 78.0–100.0)
Platelets: 212 10*3/uL (ref 150–400)
RBC: 4.42 MIL/uL (ref 3.87–5.11)
RDW: 13.6 % (ref 11.5–15.5)
WBC: 8 10*3/uL (ref 4.0–10.5)

## 2017-06-21 LAB — RAPID URINE DRUG SCREEN, HOSP PERFORMED
AMPHETAMINES: NOT DETECTED
BARBITURATES: NOT DETECTED
Benzodiazepines: NOT DETECTED
Cocaine: NOT DETECTED
OPIATES: NOT DETECTED
TETRAHYDROCANNABINOL: NOT DETECTED

## 2017-06-21 LAB — I-STAT BETA HCG BLOOD, ED (MC, WL, AP ONLY)

## 2017-06-21 LAB — LIPASE, BLOOD: Lipase: 25 U/L (ref 11–51)

## 2017-06-21 MED ORDER — ONDANSETRON 4 MG PO TBDP: 4.0000 mg | ORAL_TABLET | Freq: Three times a day (TID) | ORAL | 0 refills | Status: DC | PRN

## 2017-06-21 MED ORDER — LAMOTRIGINE 100 MG PO TABS
200.0000 mg | ORAL_TABLET | Freq: Every day | ORAL | Status: DC
  Administered 2017-06-23 – 2017-06-24 (×2): 200 mg via ORAL
  Filled 2017-06-21 (×2): qty 2
  Filled 2017-06-21: qty 1

## 2017-06-21 MED ORDER — FENTANYL CITRATE (PF) 100 MCG/2ML IJ SOLN
100.0000 ug | Freq: Once | INTRAMUSCULAR | Status: AC
  Administered 2017-06-21: 100 ug via INTRAVENOUS
  Filled 2017-06-21: qty 2

## 2017-06-21 MED ORDER — ACETAMINOPHEN 650 MG RE SUPP: 650.0000 mg | Freq: Four times a day (QID) | RECTAL | Status: DC | PRN

## 2017-06-21 MED ORDER — ONDANSETRON HCL 4 MG PO TABS: 4.0000 mg | ORAL_TABLET | Freq: Four times a day (QID) | ORAL | Status: DC | PRN

## 2017-06-21 MED ORDER — SODIUM CHLORIDE 0.9 % IV SOLN
INTRAVENOUS | Status: DC
  Administered 2017-06-21 – 2017-06-22 (×2): via INTRAVENOUS

## 2017-06-21 MED ORDER — SODIUM CHLORIDE 0.9 % IV SOLN
1.0000 g | INTRAVENOUS | Status: DC
  Administered 2017-06-21 – 2017-06-24 (×3): 1 g via INTRAVENOUS
  Filled 2017-06-21 (×5): qty 10

## 2017-06-21 MED ORDER — DIPHENHYDRAMINE HCL 50 MG/ML IJ SOLN
25.0000 mg | Freq: Once | INTRAMUSCULAR | Status: AC
  Administered 2017-06-21: 25 mg via INTRAVENOUS
  Filled 2017-06-21: qty 1

## 2017-06-21 MED ORDER — METOCLOPRAMIDE HCL 5 MG/ML IJ SOLN
5.0000 mg | Freq: Once | INTRAMUSCULAR | Status: AC
  Administered 2017-06-21: 5 mg via INTRAVENOUS
  Filled 2017-06-21: qty 2

## 2017-06-21 MED ORDER — FAMOTIDINE IN NACL 20-0.9 MG/50ML-% IV SOLN
20.0000 mg | Freq: Once | INTRAVENOUS | Status: AC
  Administered 2017-06-21: 20 mg via INTRAVENOUS
  Filled 2017-06-21: qty 50

## 2017-06-21 MED ORDER — FENTANYL CITRATE (PF) 100 MCG/2ML IJ SOLN
75.0000 ug | Freq: Once | INTRAMUSCULAR | Status: AC
  Administered 2017-06-21: 75 ug via INTRAVENOUS
  Filled 2017-06-21: qty 2

## 2017-06-21 MED ORDER — ONDANSETRON HCL 4 MG/2ML IJ SOLN
4.0000 mg | Freq: Once | INTRAMUSCULAR | Status: AC
  Administered 2017-06-21: 4 mg via INTRAVENOUS
  Filled 2017-06-21: qty 2

## 2017-06-21 MED ORDER — HEPARIN SODIUM (PORCINE) 5000 UNIT/ML IJ SOLN
5000.0000 [IU] | Freq: Three times a day (TID) | INTRAMUSCULAR | Status: DC
  Administered 2017-06-21 – 2017-06-24 (×7): 5000 [IU] via SUBCUTANEOUS
  Filled 2017-06-21 (×7): qty 1

## 2017-06-21 MED ORDER — HYDROCODONE-ACETAMINOPHEN 5-325 MG PO TABS: 1.0000 | ORAL_TABLET | Freq: Four times a day (QID) | ORAL | 0 refills | Status: DC | PRN

## 2017-06-21 MED ORDER — NAPROXEN 375 MG PO TABS: 375.0000 mg | ORAL_TABLET | Freq: Two times a day (BID) | ORAL | 0 refills | Status: DC | PRN

## 2017-06-21 MED ORDER — FENTANYL CITRATE (PF) 100 MCG/2ML IJ SOLN
50.0000 ug | Freq: Once | INTRAMUSCULAR | Status: AC
  Administered 2017-06-21: 50 ug via INTRAVENOUS
  Filled 2017-06-21: qty 2

## 2017-06-21 MED ORDER — FENTANYL CITRATE (PF) 100 MCG/2ML IJ SOLN
75.0000 ug | INTRAMUSCULAR | Status: DC | PRN
Start: 2017-06-21 — End: 2017-06-24
  Administered 2017-06-21 – 2017-06-23 (×8): 75 ug via INTRAVENOUS
  Filled 2017-06-21 (×8): qty 2

## 2017-06-21 MED ORDER — DICYCLOMINE HCL 20 MG PO TABS: 20.0000 mg | ORAL_TABLET | Freq: Three times a day (TID) | ORAL | 0 refills | Status: DC

## 2017-06-21 MED ORDER — ONDANSETRON HCL 4 MG/2ML IJ SOLN
4.0000 mg | Freq: Four times a day (QID) | INTRAMUSCULAR | Status: DC | PRN
  Administered 2017-06-21 – 2017-06-24 (×4): 4 mg via INTRAVENOUS
  Filled 2017-06-21 (×6): qty 2

## 2017-06-21 MED ORDER — INSULIN ASPART 100 UNIT/ML ~~LOC~~ SOLN
0.0000 [IU] | Freq: Three times a day (TID) | SUBCUTANEOUS | Status: DC
  Administered 2017-06-22: 3 [IU] via SUBCUTANEOUS
  Administered 2017-06-22 – 2017-06-23 (×2): 2 [IU] via SUBCUTANEOUS

## 2017-06-21 MED ORDER — IOPAMIDOL (ISOVUE-300) INJECTION 61%
INTRAVENOUS | Status: AC
  Administered 2017-06-21: 100 mL
  Filled 2017-06-21: qty 200

## 2017-06-21 MED ORDER — ACETAMINOPHEN 325 MG PO TABS
650.0000 mg | ORAL_TABLET | Freq: Four times a day (QID) | ORAL | Status: DC | PRN
Start: 2017-06-21 — End: 2017-06-24
  Administered 2017-06-22: 650 mg via ORAL
  Filled 2017-06-21: qty 2

## 2017-06-21 MED ORDER — METOCLOPRAMIDE HCL 5 MG/ML IJ SOLN
10.0000 mg | Freq: Once | INTRAMUSCULAR | Status: AC
Start: 2017-06-21 — End: 2017-06-21
  Administered 2017-06-21: 10 mg via INTRAVENOUS
  Filled 2017-06-21: qty 2

## 2017-06-21 MED ORDER — VENLAFAXINE HCL ER 150 MG PO CP24
150.0000 mg | ORAL_CAPSULE | Freq: Every day | ORAL | Status: DC
  Administered 2017-06-23 – 2017-06-24 (×2): 150 mg via ORAL
  Filled 2017-06-21 (×3): qty 1

## 2017-06-21 MED ORDER — SODIUM CHLORIDE 0.9 % IV BOLUS
1000.0000 mL | Freq: Once | INTRAVENOUS | Status: AC
  Administered 2017-06-21: 1000 mL via INTRAVENOUS

## 2017-06-21 NOTE — ED Notes (Signed)
Pt ambulated to bathroom independently

## 2017-06-21 NOTE — Consult Note (Signed)
Reason for Consult:abd pain Referring Physician: Dr [name] [name] is an 42 y.o. female.  HPI: 42 yo AAF with severe obesity, bipolar 1, osa/cpap, DM 2 presented to ED with c/o worsening abdominal pain. Pain started yesterday. Constant. Radiated to back. Pain in upper abdomen and LUQ/LLQ. Pain is sharp. Nothing relieves it other than pain meds. Not aggravated by anything. Some nausea and dry heaves. No f/c/d/c/melena/hematochezia/dysuria/hematuria/wt change. Only sx hysterectomy. No prior symptoms. Ct shows gallstone. Labs normal  Past Medical History:  Diagnosis Date  . Allergy   . Anemia   . Bipolar 1 disorder [facility])    Sees [name]. [name] [name], psychology,  [phone]  . Carpal tunnel syndrome   . Depression   . Diabetes mellitus without complication ([place])   . Family history of adverse reaction to anesthesia    mother & son have difficulty waking  . Fatigue   . Sleep apnea    uses cpap    Past Surgical History:  Procedure Laterality Date  . ABDOMINAL HYSTERECTOMY    . I&D EXTREMITY Right 09/14/2015   Procedure: IRRIGATION AND DEBRIDEMENT RIGHT MIDDLE FINGER;  Surgeon: [name] [name], [name] [name];  Location: [facility];  Service: Orthopedics;  Laterality: Right;  . TUBAL LIGATION      Family History  Problem Relation Age of Onset  . Hypertension Mother   . Diabetes Mellitus II Mother   . Congestive Heart Failure Father   . Hypertension Father   . Diabetes Mellitus II Father     Social History:  reports that she has never smoked. She has never used smokeless tobacco. She reports that she drinks alcohol. She reports that she does not use drugs.  Allergies:  Allergies  Allergen Reactions  . Hydromorphone Hcl Anaphylaxis and Hives  . Meperidine Hcl Anaphylaxis and Hives    REACTION: ?anaphylaxis  . Morphine Anaphylaxis and Hives    Anything in this family REACTION: ?anaphyaxis  . Lisinopril Cough  . Sumatriptan Other (See Comments)    Makes headaches worse.   . Tape Rash     EKG LEADS BLISTER THE PATIENT'S SKIN AND LEAVE RED, RAISED AREAS!!  . Zolmitriptan Other (See Comments)    Headache    Medications: I have reviewed the patient's current medications.  Results for orders placed or performed during the hospital encounter of 06/21/17 (from the past 48 hour(s))  Urinalysis, Routine w reflex microscopic     Status: Abnormal   Collection Time: 06/21/17  8:09 AM  Result Value Ref Range   Color, Urine YELLOW YELLOW   APPearance CLEAR CLEAR   Specific Gravity, Urine 1.024 1.005 - 1.030   pH 5.0 5.0 - 8.0   Glucose, UA NEGATIVE NEGATIVE mg/dL   Hgb urine dipstick NEGATIVE NEGATIVE   Bilirubin Urine NEGATIVE NEGATIVE   Ketones, ur NEGATIVE NEGATIVE mg/dL   Protein, ur 30 (A) NEGATIVE mg/dL   Nitrite NEGATIVE NEGATIVE   Leukocytes, UA NEGATIVE NEGATIVE   RBC / HPF 0-5 0 - 5 RBC/hpf   WBC, UA 0-5 0 - 5 WBC/hpf   Bacteria, UA NONE SEEN NONE SEEN   Squamous Epithelial / LPF 0-5 (A) NONE SEEN    Comment: Performed at [facility], [place] [address] [place], [place] [address]  Lipase, blood     Status: None   Collection Time: 06/21/17  8:12 AM  Result Value Ref Range   Lipase 25 11 - 51 U/L    Comment: Performed at [facility]  [address] [place], [place] [address]  Comprehensive metabolic panel     Status: Abnormal   Collection Time: 06/21/17  8:12 AM  Result Value Ref Range   Sodium 141 135 - 145 mmol/L   Potassium 4.0 3.5 - 5.1 mmol/L   Chloride 106 101 - 111 mmol/L   CO2 24 22 - 32 mmol/L   Glucose, Bld 204 (H) 65 - 99 mg/dL   BUN 7 6 - 20 mg/dL   Creatinine, Ser 0.68 0.44 - 1.00 mg/dL   Calcium 9.6 8.9 - 10.3 mg/dL   Total Protein 7.3 6.5 - 8.1 g/dL   Albumin 3.6 3.5 - 5.0 g/dL   AST 17 15 - 41 U/L   ALT 17 14 - 54 U/L   Alkaline Phosphatase 85 38 - 126 U/L   Total Bilirubin 0.7 0.3 - 1.2 mg/dL   GFR calc non Af Amer >60 >60 mL/min   GFR calc Af Amer >60 >60 mL/min    Comment: (NOTE) The eGFR has been calculated using the  CKD EPI equation. This calculation has not been validated in all clinical situations. eGFR's persistently <60 mL/min signify possible Chronic Kidney Disease.    Anion gap 11 5 - 15    Comment: Performed at [facility] [address], [place] [address]  CBC     Status: None   Collection Time: 06/21/17  8:12 AM  Result Value Ref Range   WBC 8.0 4.0 - 10.5 K/uL   RBC 4.42 3.87 - 5.11 MIL/uL   Hemoglobin 12.4 12.0 - 15.0 g/dL   HCT 38.3 36.0 - 46.0 %   MCV 86.7 78.0 - 100.0 fL   MCH 28.1 26.0 - 34.0 pg   MCHC 32.4 30.0 - 36.0 g/dL   RDW 13.6 11.5 - 15.5 %   Platelets 212 150 - 400 K/uL    Comment: Performed at [facility] [address], [place], [place] [address]  I-Stat beta hCG blood, ED     Status: None   Collection Time: 06/21/17  8:25 AM  Result Value Ref Range   I-stat hCG, quantitative <5.0 <5 mIU/mL   Comment 3            Comment:   GEST. AGE      CONC.  (mIU/mL)   <=1 WEEK        5 - 50     2 WEEKS       50 - 500     3 WEEKS       100 - 10,000     4 WEEKS     1,000 - 30,000        FEMALE AND NON-PREGNANT FEMALE:     LESS THAN 5 mIU/mL   Rapid urine drug screen (hospital performed)     Status: None   Collection Time: 06/21/17  9:11 AM  Result Value Ref Range   Opiates NONE DETECTED NONE DETECTED   Cocaine NONE DETECTED NONE DETECTED   Benzodiazepines NONE DETECTED NONE DETECTED   Amphetamines NONE DETECTED NONE DETECTED   Tetrahydrocannabinol NONE DETECTED NONE DETECTED   Barbiturates NONE DETECTED NONE DETECTED    Comment: (NOTE) DRUG SCREEN FOR MEDICAL PURPOSES ONLY.  IF CONFIRMATION IS NEEDED FOR ANY PURPOSE, NOTIFY LAB WITHIN 5 DAYS. LOWEST DETECTABLE LIMITS FOR URINE DRUG SCREEN Drug Class                     Cutoff (ng/mL) Amphetamine and metabolites  1000 Barbiturate and metabolites    200 Benzodiazepine                 654 Tricyclics and metabolites     300 Opiates and metabolites        300 Cocaine and metabolites         300 THC                            50 Performed at [facility], [place] [address], [place], [place] [address]   I-Stat Troponin, ED (not at [facility])     Status: None   Collection Time: 06/21/17 12:34 PM  Result Value Ref Range   Troponin i, poc 0.00 0.00 - 0.08 ng/mL   Comment 3            Comment: Due to the release kinetics of cTnI, a negative result within the first hours of the onset of symptoms does not rule out myocardial infarction with certainty. If myocardial infarction is still suspected, repeat the test at appropriate intervals.     Ct Abdomen Pelvis W Contrast  Result Date: 06/21/2017 CLINICAL DATA:  Abdominal distension, nausea, and vomiting, history type II diabetes mellitus, benign systemic hypertension EXAM: CT ABDOMEN AND PELVIS WITH CONTRAST TECHNIQUE: Multidetector CT imaging of the abdomen and pelvis was performed using the standard protocol following bolus administration of intravenous contrast. Sagittal and coronal MPR images reconstructed from axial data set. CONTRAST:  140m ISOVUE-300 IOPAMIDOL (ISOVUE-300) INJECTION 61% IV. No oral contrast. COMPARISON:  11/12/2006 FINDINGS: Lower chest: Minimal bibasilar atelectasis Hepatobiliary: Cholelithiasis, with a gas containing gallstone in gallbladder at least 12 mm diameter. Liver unremarkable. No biliary dilatation. Pancreas: Normal appearance Spleen: Normal appearance Adrenals/Urinary Tract: Adrenal glands, kidneys, ureters, and bladder normal appearance Stomach/Bowel: Stomach decompressed. Appendix normal appearance. Large and small bowel loops normal appearance. No bowel dilatation or bowel wall thickening. No evidence of obstruction. Vascular/Lymphatic: Vascular structures unremarkable. No adenopathy. Pelvic phleboliths. Reproductive: Uterus surgically absent. Nonvisualization of ovaries. Other: No free air or free fluid. Tiny umbilical hernia containing fat. Musculoskeletal: Degenerative disc disease changes L4-L5.  Degenerative changes of the hip joints bilaterally. IMPRESSION: Cholelithiasis. Tiny umbilical hernia containing fat. No acute intra-abdominal or intrapelvic abnormalities. Electronically Signed   By: MLavonia DanaM.D.   On: 06/21/2017 12:02   UK[place]Abdomen Limited Ruq  Result Date: 06/21/2017 CLINICAL DATA:  Right upper quadrant pain EXAM: ULTRASOUND ABDOMEN LIMITED RIGHT UPPER QUADRANT COMPARISON:  CT abdomen and pelvis June 21, 2017 FINDINGS: Gallbladder: Within the gallbladder, there are echogenic foci which move and shadow consistent with cholelithiasis. Largest gallstone measures 2.2 cm in length. There is pericholecystic fluid with wall thickening. No sonographic Murphy sign noted by sonographer. Common bile duct: Diameter: 5 mm. No intrahepatic or extrahepatic biliary duct dilatation. Liver: No focal lesion identified. Liver echogenicity is increased. Portal vein is patent on color Doppler imaging with normal direction of blood flow towards the liver. IMPRESSION: 1. Gallstones with pericholecystic fluid and gallbladder wall thickening. These are findings felt to be indicative of a degree of acute cholecystitis. 2. Increase in liver echogenicity, a finding felt to be indicative of hepatic steatosis. While no focal liver lesions are evident on this study, it must be cautioned that sensitivity of ultrasound for detection of focal liver lesions is diminished in this circumstance. Electronically Signed   By: WLowella GripIII M.D.   On: 06/21/2017 16:21    Review of  Systems  Constitutional: Negative for chills, fever and weight loss.  HENT: Negative for nosebleeds.   Eyes: Negative for blurred vision.  Respiratory: Negative for shortness of breath.   Cardiovascular: Negative for chest pain, palpitations, orthopnea and PND.       Denies DOE  Gastrointestinal: Positive for abdominal pain and nausea. Negative for blood in stool, constipation, diarrhea, heartburn, melena and vomiting.  Genitourinary:  Negative for dysuria and hematuria.  Musculoskeletal: Negative.   Skin: Negative for itching and rash.  Neurological: Negative for dizziness, focal weakness, seizures, loss of consciousness and headaches.       Denies TIAs, amaurosis fugax  Endo/Heme/Allergies: Does not bruise/bleed easily.  Psychiatric/Behavioral: The patient is not nervous/anxious.    Blood pressure 137/80, pulse 85, temperature 98.7 F (37.1 C), temperature source Oral, resp. rate 18, height _0  (1.727 m), weight (!) 142.9 kg (315 lb), SpO2 99 %. Physical Exam  Vitals reviewed. Constitutional: She is oriented to person, place, and time. She appears well-developed and well-nourished. No distress.  Resting comfortably, severe obesity  HENT:  Head: Normocephalic and atraumatic.  Right Ear: External ear normal.  Left Ear: External ear normal.  Eyes: Conjunctivae are normal. No scleral icterus.  Neck: Normal range of motion. Neck supple. No tracheal deviation present. No thyromegaly present.  Cardiovascular: Normal rate and normal heart sounds.  Respiratory: Effort normal and breath sounds normal. No stridor. No respiratory distress. She has no wheezes.  GI: Soft. She exhibits no distension. There is tenderness in the right upper quadrant, epigastric area, left upper quadrant and left lower quadrant. There is no rigidity, no rebound and no guarding.  Severe obesity; epigastric/ruq/L sided abd TTP; no guarding/peritonitis/rebound.   Musculoskeletal: She exhibits no edema or tenderness.  Lymphadenopathy:    She has no cervical adenopathy.  Neurological: She is alert and oriented to person, place, and time. She exhibits normal muscle tone.  Skin: Skin is warm and dry. No rash noted. She is not diaphoretic. No erythema. No pallor.  Psychiatric: She has a normal mood and affect. Her behavior is normal. Judgment and thought content normal.    Assessment/Plan: Abdominal pain Severe obesity osa on  cpap Bipolar dm2 Cholelithiasis  She does have gallstone with some radiological signs of possible cholecystitis and RUQ TTP and epigastric pain but she says her most tender area is LUQ/left lateral abdominal wall which is not really c/w with biliary colic. Since she has ongoing pain after being in ED for multiple hours probably needs admit  rec medicine admit Clears as tolerated abx for cholecystitis - rocephin HIDA scan in AM to evaluate for cholecystitis  Repeat labs in am  Can have chemical vte prophylaxis  [name] Ruff. Redmond Pulling, [name] [name], [name] [name] [facility]   [name] [name] 06/21/2017, 6:00 PM

## 2017-06-21 NOTE — ED Notes (Signed)
Vital signs stable. 

## 2017-06-21 NOTE — ED Notes (Signed)
Called ultrasound to check status 30(ish) more minutes, pt updated

## 2017-06-21 NOTE — ED Provider Notes (Signed)
[facility] EMERGENCY DEPARTMENT Provider Note   CSN: [id number] Arrival date & time: 06/21/17  0802     History   Chief Complaint Chief Complaint  Patient presents with  . Abdominal Pain    HPI [name] [name] is a 42 y.o. female.  HPI 42 year old female with past medical history of bipolar disorder, depression, here with severe abdominal pain.  Patient states that her symptoms started gradually last night.  She describes it as an aching, throbbing, stabbing, burning sensation in her epigastric and left upper quadrant.  Since then, it has progressively worsened and is now severe, 10 out of 10.  Worse with any attempted eating or palpation or movement.  She had associated nausea, vomiting, now dry heaving.  No constipation.  No fevers or chills.  No right upper quadrant pain.  No history of similar issues.  She did take ibuprofen which seemed to make her symptoms worse.  She takes ibuprofen intermittently.  No known history of ulcers.  Denies any blood or melena.  Past Medical History:  Diagnosis Date  . Allergy   . Anemia   . Bipolar 1 disorder [facility])    Sees Dr. Ledon Snare, psychology,  [phone]  . Carpal tunnel syndrome   . Depression   . Diabetes mellitus without complication (HCC)   . Family history of adverse reaction to anesthesia    mother & son have difficulty waking  . Fatigue   . Sleep apnea    uses cpap    Patient Active Problem List   Diagnosis Date Noted  . Constipation 02/02/2017  . Abdominal pain 01/12/2017  . Vaginal discharge 01/12/2017  . OSA on CPAP 02/09/2016  . Super obese 02/09/2016  . Diabetes mellitus with hyperglycemia (HCC) 11/25/2015  . Type 2 diabetes mellitus without complication, without long-term current use of insulin (HCC)   . Abdominal pain, left lower quadrant 07/13/2014  . Lung nodule seen on imaging study 09/24/2013  . 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 Meralgia paresthetica of left side 08/18/2010  . Vitamin D  deficiency 09/20/2007  . MENOPAUSE, SURGICAL 04/24/2007  . Diaphragmatic hernia 11/20/2006  . Hypercholesterolemia 05/03/2006  . OBESITY, NOS 05/03/2006  . ANEMIA, IRON DEFICIENCY, UNSPEC. 05/03/2006  . Bipolar I disorder (HCC) 05/03/2006  . ANXIETY 05/03/2006  . MIGRAINE, UNSPEC., W/O INTRACTABLE MIGRAINE 05/03/2006  . HYPERTENSION, BENIGN SYSTEMIC 05/03/2006  . PEPTIC ULCER DIS., UNSPEC. W/O OBSTRUCTION 05/03/2006    Past Surgical History:  Procedure Laterality Date  . ABDOMINAL HYSTERECTOMY    . I&D EXTREMITY Right 09/14/2015   Procedure: IRRIGATION AND DEBRIDEMENT RIGHT MIDDLE FINGER;  Surgeon: Dominica Severin, MD;  Location: [facility] OR;  Service: Orthopedics;  Laterality: Right;  . TUBAL LIGATION       OB History   None      Home Medications    Prior to Admission medications   Medication Sig Start Date End Date Taking? Authorizing Provider  amoxicillin (AMOXIL) 875 MG tablet Take 1 tablet (875 mg total) by mouth 2 (two) times daily. 05/18/17   Elvina Sidle, MD  dicyclomine (BENTYL) 20 MG tablet Take 1 tablet (20 mg total) by mouth 4 (four) times daily -  before meals and at bedtime for 7 days. 06/21/17 06/28/17  Shaune Pollack, MD  docusate sodium (COLACE) 100 MG capsule Take 1 capsule (100 mg total) by mouth 2 (two) times daily. 02/02/17   Mikell, Antionette Poles, MD  Insulin Glargine (LANTUS SOLOSTAR) 100 UNIT/ML Solostar Pen Inject 60 Units into the skin  daily. 05/24/17   Berton Bon, MD  insulin lispro (HUMALOG) 100 UNIT/ML KiwkPen Take 5 units If blood sugar is above 200 before meal 02/13/17   Mikell, Antionette Poles, MD  lamoTRIgine (LAMICTAL) 200 MG tablet Take 1 tablet (200 mg total) by mouth daily. 06/08/17   Mikell, Antionette Poles, MD  metFORMIN (GLUMETZA) 1000 MG (MOD) 24 hr tablet Take 1 tablet (1,000 mg total) by mouth at bedtime. 08/11/16   Joanna Puff, MD  naproxen (NAPROSYN) 375 MG tablet Take 1 tablet (375 mg total) by mouth 2 (two) times daily as needed for  up to 7 days for moderate pain. 06/21/17 06/28/17  Shaune Pollack, MD  ondansetron (ZOFRAN ODT) 4 MG disintegrating tablet Take 1 tablet (4 mg total) by mouth every 8 (eight) hours as needed for nausea or vomiting. 06/21/17   Shaune Pollack, MD  polyethylene glycol powder (GLYCOLAX/MIRALAX) powder Take 17 g by mouth daily as needed. 02/02/17   Berton Bon, MD  venlafaxine XR (EFFEXOR-XR) 150 MG 24 hr capsule Take 1 capsule (150 mg total) by mouth daily with breakfast. 02/02/17   Mikell, Antionette Poles, MD  Vitamin D, Ergocalciferol, (DRISDOL) 50000 units CAPS capsule Take 1 capsule (50,000 Units total) by mouth every 7 (seven) days. 01/01/17   Moses Manners, MD    Family History Family History  Problem Relation Age of Onset  . Hypertension Mother   . Diabetes Mellitus II Mother   . Congestive Heart Failure Father   . Hypertension Father   . Diabetes Mellitus II Father     Social History Social History   Tobacco Use  . Smoking status: Never Smoker  . Smokeless tobacco: Never Used  Substance Use Topics  . Alcohol use: Yes    Alcohol/week: 0.0 oz    Comment: occ  . Drug use: No     Allergies   Hydromorphone hcl; Meperidine hcl; Morphine; Lisinopril; Sumatriptan; Tape; and Zolmitriptan   Review of Systems Review of Systems  Constitutional: Positive for fatigue. Negative for chills and fever.  HENT: Negative for congestion, rhinorrhea and sore throat.   Eyes: Negative for visual disturbance.  Respiratory: Negative for cough, shortness of breath and wheezing.   Cardiovascular: Negative for chest pain and leg swelling.  Gastrointestinal: Positive for abdominal pain, nausea and vomiting. Negative for diarrhea.  Genitourinary: Negative for dysuria, flank pain, vaginal bleeding and vaginal discharge.  Musculoskeletal: Negative for neck pain.  Skin: Negative for rash.  Allergic/Immunologic: Negative for immunocompromised state.  Neurological: Negative for syncope and  headaches.  Hematological: Does not bruise/bleed easily.  All other systems reviewed and are negative.    Physical Exam Updated Vital Signs BP 137/80 (BP Location: Right Arm)   Pulse 85   Temp 98.7 F (37.1 C) (Oral)   Resp 18   Ht 5\' 8"  (1.727 m)   Wt (!) 142.9 kg (315 lb)   SpO2 99%   BMI 47.90 kg/m   Physical Exam  Constitutional: She is oriented to person, place, and time. She appears well-developed and well-nourished. She appears distressed (Moaning in pain).  HENT:  Head: Normocephalic and atraumatic.  Eyes: Conjunctivae are normal.  Neck: Neck supple.  Cardiovascular: Normal rate, regular rhythm and normal heart sounds. Exam reveals no friction rub.  No murmur heard. Pulmonary/Chest: Effort normal and breath sounds normal. No respiratory distress. She has no wheezes. She has no rales.  Abdominal: Bowel sounds are normal. There is generalized tenderness and tenderness in the epigastric area. There is  guarding. There is no rigidity, no rebound, no CVA tenderness and negative Murphy's sign.  Musculoskeletal: She exhibits no edema.  Neurological: She is alert and oriented to person, place, and time. She exhibits normal muscle tone.  Skin: Skin is warm. Capillary refill takes less than 2 seconds.  Psychiatric: She has a normal mood and affect.  Nursing note and vitals reviewed.    ED Treatments / Results  Labs (all labs ordered are listed, but only abnormal results are displayed) Labs Reviewed  COMPREHENSIVE METABOLIC PANEL - Abnormal; Notable for the following components:      Result Value   Glucose, Bld 204 (*)    All other components within normal limits  URINALYSIS, ROUTINE W REFLEX MICROSCOPIC - Abnormal; Notable for the following components:   Protein, ur 30 (*)    Squamous Epithelial / LPF 0-5 (*)    All other components within normal limits  LIPASE, BLOOD  CBC  RAPID URINE DRUG SCREEN, HOSP PERFORMED  I-STAT BETA HCG BLOOD, ED ([facility], [facility], [facility] ONLY)  I-STAT  TROPONIN, ED    EKG EKG Interpretation  Date/Time:  Thursday June 21 2017 10:14:26 EDT Ventricular Rate:  71 PR Interval:    QRS Duration: 85 QT Interval:  382 QTC Calculation: 416 R Axis:   78 Text Interpretation:  Sinus rhythm No significant change since last tracing Confirmed by Shaune PollackIsaacs, Jeramy Dimmick [phone]([id number]) on 06/21/2017 3:28:52 PM   Radiology Ct Abdomen Pelvis W Contrast  Result Date: 06/21/2017 CLINICAL DATA:  Abdominal distension, nausea, and vomiting, history type II diabetes mellitus, benign systemic hypertension EXAM: CT ABDOMEN AND PELVIS WITH CONTRAST TECHNIQUE: Multidetector CT imaging of the abdomen and pelvis was performed using the standard protocol following bolus administration of intravenous contrast. Sagittal and coronal MPR images reconstructed from axial data set. CONTRAST:  100mL ISOVUE-300 IOPAMIDOL (ISOVUE-300) INJECTION 61% IV. No oral contrast. COMPARISON:  11/12/2006 FINDINGS: Lower chest: Minimal bibasilar atelectasis Hepatobiliary: Cholelithiasis, with a gas containing gallstone in gallbladder at least 12 mm diameter. Liver unremarkable. No biliary dilatation. Pancreas: Normal appearance Spleen: Normal appearance Adrenals/Urinary Tract: Adrenal glands, kidneys, ureters, and bladder normal appearance Stomach/Bowel: Stomach decompressed. Appendix normal appearance. Large and small bowel loops normal appearance. No bowel dilatation or bowel wall thickening. No evidence of obstruction. Vascular/Lymphatic: Vascular structures unremarkable. No adenopathy. Pelvic phleboliths. Reproductive: Uterus surgically absent. Nonvisualization of ovaries. Other: No free air or free fluid. Tiny umbilical hernia containing fat. Musculoskeletal: Degenerative disc disease changes L4-L5. Degenerative changes of the hip joints bilaterally. IMPRESSION: Cholelithiasis. Tiny umbilical hernia containing fat. No acute intra-abdominal or intrapelvic abnormalities. Electronically Signed   By: Ulyses SouthwardMark  Boles  M.D.   On: 06/21/2017 12:02    Procedures Procedures (including critical care time)  Medications Ordered in ED Medications  fentaNYL (SUBLIMAZE) injection 100 mcg (100 mcg Intravenous Given 06/21/17 0856)  ondansetron (ZOFRAN) injection 4 mg (4 mg Intravenous Given 06/21/17 0857)  famotidine (PEPCID) IVPB 20 mg premix (0 mg Intravenous Stopped 06/21/17 1026)  sodium chloride 0.9 % bolus 1,000 mL (0 mLs Intravenous Stopped 06/21/17 1206)  metoCLOPramide (REGLAN) injection 10 mg (10 mg Intravenous Given 06/21/17 1003)  diphenhydrAMINE (BENADRYL) injection 25 mg (25 mg Intravenous Given 06/21/17 1024)  fentaNYL (SUBLIMAZE) injection 75 mcg (75 mcg Intravenous Given 06/21/17 1012)  iopamidol (ISOVUE-300) 61 % injection (100 mLs  Contrast Given 06/21/17 1131)  fentaNYL (SUBLIMAZE) injection 75 mcg (75 mcg Intravenous Given 06/21/17 1231)  fentaNYL (SUBLIMAZE) injection 50 mcg (50 mcg Intravenous Given 06/21/17 1332)  fentaNYL (SUBLIMAZE) injection 50  mcg (50 mcg Intravenous Given 06/21/17 1522)     Initial Impression / Assessment and Plan / ED Course  I have reviewed the triage vital signs and the nursing notes.  Pertinent labs & imaging results that were available during my care of the patient were reviewed by me and considered in my medical decision making (see chart for details).  Clinical Course as of Jun 21 1529  Thu Jun 21, 2017  5274 42 year old female here with severe abdominal pain.  Differential includes gastritis, peptic ulcer disease, pancreatitis, less likely cholecystitis.  IV fluids and analgesia given with antiemetics.  Will check CT scan.  No vaginal bleeding, lower abdominal pain, or symptoms to suggest PID or GU etiology.   [CI]  [id number] Patient tolerated well.  Seems more comfortable.   [CI]  1449 Initial imaging negative other than cholelithiasis.  Patient has ongoing right upper quadrant pain.  While her LFTs are normal, her history is certainly consistent with possible biliary  colic.  Will continue analgesia, sent for ultrasound.   [CI]  1529 Pt improving clinically. F/u U/S. If negative for chole, likely d/c with outtp follow-up and analgesia. If positive or persistent pain, surgical consult. Pt aware.   [CI]    Clinical Course User Index [CI] Shaune Pollack, MD    Patient care transferred to Dr. Ethelda Chick at the end of my shift. Patient presentation, ED course, and plan of care discussed with review of all pertinent labs and imaging. Please see his/her note for further details regarding further ED course and disposition.   Final Clinical Impressions(s) / ED Diagnoses   Final diagnoses:  RUQ pain  Calculus of gallbladder without cholecystitis without obstruction    ED Discharge Orders        Ordered    naproxen (NAPROSYN) 375 MG tablet  2 times daily PRN     06/21/17 1531    dicyclomine (BENTYL) 20 MG tablet  3 times daily before meals & bedtime     06/21/17 1531    ondansetron (ZOFRAN ODT) 4 MG disintegrating tablet  Every 8 hours PRN     06/21/17 1531       Shaune Pollack, MD 06/21/17 1531

## 2017-06-21 NOTE — ED Notes (Signed)
Dr. Erma HeritageIsaacs aware pt requesting pain medication

## 2017-06-21 NOTE — ED Notes (Signed)
Pure wick placed - hooked to low suction; pt states initial dose of fentanyl "helped" but "pain is back; MD aware of same - med order written

## 2017-06-21 NOTE — ED Notes (Signed)
Patient transported to X-ray 

## 2017-06-21 NOTE — ED Notes (Signed)
Pt's family very concerned that plan of care was not addressed by admitting.

## 2017-06-21 NOTE — ED Notes (Signed)
When pt went to xray this RN offered to move pt and her family member to "BH04" area since pt was sitting in hallway infront of TrA. Pt family member became very rude with this RN and stated that she was not moving anywhere until pt came back from xray, she also stated "why would I move over there so I can be sitting here until 5 am" Offered to move so they could have dividers place around them and family states "I dont want those dividers we've been sitting out in the open this entire time so whats the difference now"

## 2017-06-21 NOTE — ED Provider Notes (Signed)
Complains of epigastric pain onset yesterday accompanied by dry heaves.  Pain is constant.  On exam alert nontoxic abdomen morbidly obese, tender at bilateral upper quadrants and epigastrium without guarding rigidity or rebound  5:55 PM pain and nausea improved after treatment with intravenous fentanyl and intravenous Reglan.  I consulted Dr. Andrey CampanileWilson from general surgery who evaluated patient in the emergency department.  He requests that I consult patient's PMD for admission.  I have consulted family medicine resident physician who will arrange for overnight stay Results for orders placed or performed during the hospital encounter of 06/21/17  Lipase, blood  Result Value Ref Range   Lipase 25 11 - 51 U/L  Comprehensive metabolic panel  Result Value Ref Range   Sodium 141 135 - 145 mmol/L   Potassium 4.0 3.5 - 5.1 mmol/L   Chloride 106 101 - 111 mmol/L   CO2 24 22 - 32 mmol/L   Glucose, Bld 204 (H) 65 - 99 mg/dL   BUN 7 6 - 20 mg/dL   Creatinine, Ser 1.610.68 0.44 - 1.00 mg/dL   Calcium 9.6 8.9 - 09.610.3 mg/dL   Total Protein 7.3 6.5 - 8.1 g/dL   Albumin 3.6 3.5 - 5.0 g/dL   AST 17 15 - 41 U/L   ALT 17 14 - 54 U/L   Alkaline Phosphatase 85 38 - 126 U/L   Total Bilirubin 0.7 0.3 - 1.2 mg/dL   GFR calc non Af Amer >60 >60 mL/min   GFR calc Af Amer >60 >60 mL/min   Anion gap 11 5 - 15  CBC  Result Value Ref Range   WBC 8.0 4.0 - 10.5 K/uL   RBC 4.42 3.87 - 5.11 MIL/uL   Hemoglobin 12.4 12.0 - 15.0 g/dL   HCT 04.538.3 40.936.0 - 81.146.0 %   MCV 86.7 78.0 - 100.0 fL   MCH 28.1 26.0 - 34.0 pg   MCHC 32.4 30.0 - 36.0 g/dL   RDW 91.413.6 78.211.5 - 95.615.5 %   Platelets 212 150 - 400 K/uL  Urinalysis, Routine w reflex microscopic  Result Value Ref Range   Color, Urine YELLOW YELLOW   APPearance CLEAR CLEAR   Specific Gravity, Urine 1.024 1.005 - 1.030   pH 5.0 5.0 - 8.0   Glucose, UA NEGATIVE NEGATIVE mg/dL   Hgb urine dipstick NEGATIVE NEGATIVE   Bilirubin Urine NEGATIVE NEGATIVE   Ketones, ur NEGATIVE  NEGATIVE mg/dL   Protein, ur 30 (A) NEGATIVE mg/dL   Nitrite NEGATIVE NEGATIVE   Leukocytes, UA NEGATIVE NEGATIVE   RBC / HPF 0-5 0 - 5 RBC/hpf   WBC, UA 0-5 0 - 5 WBC/hpf   Bacteria, UA NONE SEEN NONE SEEN   Squamous Epithelial / LPF 0-5 (A) NONE SEEN  Rapid urine drug screen (hospital performed)  Result Value Ref Range   Opiates NONE DETECTED NONE DETECTED   Cocaine NONE DETECTED NONE DETECTED   Benzodiazepines NONE DETECTED NONE DETECTED   Amphetamines NONE DETECTED NONE DETECTED   Tetrahydrocannabinol NONE DETECTED NONE DETECTED   Barbiturates NONE DETECTED NONE DETECTED  I-Stat beta hCG blood, ED  Result Value Ref Range   I-stat hCG, quantitative <5.0 <5 mIU/mL   Comment 3          I-Stat Troponin, ED [facility])  Result Value Ref Range   Troponin i, poc 0.00 0.00 - 0.08 ng/mL   Comment 3           Ct Abdomen Pelvis W Contrast  Result Date:  06/21/2017 CLINICAL DATA:  Abdominal distension, nausea, and vomiting, history type II diabetes mellitus, benign systemic hypertension EXAM: CT ABDOMEN AND PELVIS WITH CONTRAST TECHNIQUE: Multidetector CT imaging of the abdomen and pelvis was performed using the standard protocol following bolus administration of intravenous contrast. Sagittal and coronal MPR images reconstructed from axial data set. CONTRAST:  ISOVUE-300 IOPAMIDOL (ISOVUE-300) INJECTION 61% IV. No oral contrast. COMPARISON:  11/12/2006 FINDINGS: Lower chest: Minimal bibasilar atelectasis Hepatobiliary: Cholelithiasis, with a gas containing gallstone in gallbladder at least 12 mm diameter. Liver unremarkable. No biliary dilatation. Pancreas: Normal appearance Spleen: Normal appearance Adrenals/Urinary Tract: Adrenal glands, kidneys, ureters, and bladder normal appearance Stomach/Bowel: Stomach decompressed. Appendix normal appearance. Large and small bowel loops normal appearance. No bowel dilatation or bowel wall thickening. No evidence of obstruction. Vascular/Lymphatic:  Vascular structures unremarkable. No adenopathy. Pelvic phleboliths. Reproductive: Uterus surgically absent. Nonvisualization of ovaries. Other: No free air or free fluid. Tiny umbilical hernia containing fat. Musculoskeletal: Degenerative disc disease changes L4-L5. Degenerative changes of the hip joints bilaterally. IMPRESSION: Cholelithiasis. Tiny umbilical hernia containing fat. No acute intra-abdominal or intrapelvic abnormalities. Electronically Signed   By: Ulyses Southward M.D.   On: 06/21/2017 12:02   US Abdomen Limited Ruq  Result Date: 06/21/2017 CLINICAL DATA:  Right upper quadrant pain EXAM: ULTRASOUND ABDOMEN LIMITED RIGHT UPPER QUADRANT COMPARISON:  CT abdomen and pelvis June 21, 2017 FINDINGS: Gallbladder: Within the gallbladder, there are echogenic foci which move and shadow consistent with cholelithiasis. Largest gallstone measures 2.2 cm in length. There is pericholecystic fluid with wall thickening. No sonographic Murphy sign noted by sonographer. Common bile duct: Diameter: 5 mm. No intrahepatic or extrahepatic biliary duct dilatation. Liver: No focal lesion identified. Liver echogenicity is increased. Portal vein is patent on color Doppler imaging with normal direction of blood flow towards the liver. IMPRESSION: 1. Gallstones with pericholecystic fluid and gallbladder wall thickening. These are findings felt to be indicative of a degree of acute cholecystitis. 2. Increase in liver echogenicity, a finding felt to be indicative of hepatic steatosis. While no focal liver lesions are evident on this study, it must be cautioned that sensitivity of ultrasound for detection of focal liver lesions is diminished in this circumstance. Electronically Signed   By: Bretta Bang III M.D.   On: 06/21/2017 16:21     Doug Sou, MD 06/21/17 1610

## 2017-06-21 NOTE — ED Notes (Signed)
Pt ambulated to nurses station from hallway bed asking if she has a room upstairs yet, advised pt that beds are still being assigned but there isn't one assigned to her just yet.  Pt states she needs something for nausea and pain then if she is just going to be left in the hallway all night..Marland Kitchen

## 2017-06-21 NOTE — H&P (Addendum)
[facility] and Physical Service Pager: [phone]  Patient name: [name] [name] Viney Medical record number: [id number] Date of birth: 01/08/1976 Age: 42 y.o. Gender: female  Primary Care Provider: Berton BonMikell, Asiyah Zahra, MD Consultants: Surgery Code Status: Full  Chief Complaint: abdominal pain  Assessment and Plan: [name] [name] Bruneau is a 42 y.o. female presenting with epigastric and bilateral upper quadrant abdominal pain x1day. PMH is significant for obesity, T2DM, OSA on CPAP, HLD, iron deficiency anemia, Bipolar disorder, HTN, peptic ulcer disease.  Upper Abdominal Pain / Cholelithasis, concern for cholecystitis Visualized on RUQ US with gall bladder wall thickening concerning for cholecystitis, however pain is located across entire upper abdomen with negative Murphy's sign, no leukocytosis, and no infectious symptoms. Hemodynamically stable with stable vital signs and no leukocytosis, making cholecystitis less likely. Other gallbladder diseases such as ascending cholangitis also unlikely without infectious symptoms. Pancreatitis unlikely with normal lipase. Gastritis/PUD is also a possibility (patient does have a history of peptic ulcer disease noted in 2012), although patient does not smoke, drink alcohol, or take a lot of NSAIDs. CT abdomen/pelvis performed in ED ruled out other causes of abdominal pain such as appendicitis or perforated colon. Trop negative and EKG without any ST/T wave changes, making MI unlikely. - admit to med-surg, attending Dr. Leveda AnnaHensel - Surgery following, appreciate recs. Plan for HIDA scan in the morning to evaluate further. - CTX (4/18 - ) for abx coverage for cholecystitis per surgery - 2 view CXR to rule out thoracic cause of upper abdominal pain - am CMP, CBC - fentanyl q2h PRN pain - zofran PRN - s/p 1L NS in the ED, continue MIVFs overnight  Type 2 Diabetes Glucose 204 on admission. Last A1c 12/2016 was 10.3. On  Metformin, Lantus 60u daily, and 5u Humalog meal coverage when CBG>200.  - Hold Metformin while hospitalized - Hold Lantus while patient is NPO - sensitive SSI - A1c  Bipolar disorder On Lamictal and Effexor at home. - continue home meds  Hepatic steatosis Visualized on RUQ US. Likely 2/2 obesity. AST/ALT within normal limits. - Monitor  OSA On CPAP at home. - CPAP qhs  FEN/GI: clears, NPO at midnight Prophylaxis: heparin  Disposition: admit to med-surg, attending Dr. Leveda AnnaHensel  History of Present Illness:  [name] [name] Withey is a 42 y.o. female presenting with epigastric and RUQ abdominal pain that started last night. She has had this pain before, but it is much more severe right now. The pain is constant and "sharp". The pain is better with the pain medication she was given in the ED. She endorses nausea but no vomiting. She has felt feverish, but hasn't checked her temperature. No recent travel. 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 No vaginal discharge. She is sexually active. She has had two recent sexual partners. No constipation. Having regular BMs. No recent trauma. Takes Ibuprofen 400mg  occasionally. When she has had this pain in the past, it was lasting 3-4 hours. The pain would come on randomly. Not worse after eating.  Review Of Systems: Per HPI with the following additions:   Review of Systems  Constitutional: Negative for chills and fever.  HENT: Positive for sore throat. Negative for congestion.   Eyes: Negative for blurred vision and double vision.  Respiratory: Negative for sputum production.   Cardiovascular: Negative for chest pain, palpitations and leg swelling.  Gastrointestinal: Positive for abdominal pain and nausea. Negative for blood in stool and vomiting.  Genitourinary:  Negative for dysuria and frequency.  Musculoskeletal: Positive for back pain.  Neurological: Negative for dizziness and headaches.    Patient Active Problem List    Diagnosis Date Noted  . Constipation 02/02/2017  . Abdominal pain 01/12/2017  . Vaginal discharge 01/12/2017  . OSA on CPAP 02/09/2016  . Super obese 02/09/2016  . Diabetes mellitus with hyperglycemia (HCC) 11/25/2015  . Type 2 diabetes mellitus without complication, without long-term current use of insulin (HCC)   . Abdominal pain, left lower quadrant 07/13/2014  . Lung nodule seen on imaging study 09/24/2013  . 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 Meralgia paresthetica of left side 08/18/2010  . Vitamin D deficiency 09/20/2007  . MENOPAUSE, SURGICAL 04/24/2007  . Diaphragmatic hernia 11/20/2006  . Hypercholesterolemia 05/03/2006  . OBESITY, NOS 05/03/2006  . ANEMIA, IRON DEFICIENCY, UNSPEC. 05/03/2006  . Bipolar I disorder (HCC) 05/03/2006  . ANXIETY 05/03/2006  . MIGRAINE, UNSPEC., W/O INTRACTABLE MIGRAINE 05/03/2006  . HYPERTENSION, BENIGN SYSTEMIC 05/03/2006  . PEPTIC ULCER DIS., UNSPEC. W/O OBSTRUCTION 05/03/2006    Past Medical History: Past Medical History:  Diagnosis Date  . Allergy   . Anemia   . Bipolar 1 disorder [facility])    Sees Dr. Ledon Snare, psychology,  [phone]  . Carpal tunnel syndrome   . Depression   . Diabetes mellitus without complication (HCC)   . Family history of adverse reaction to anesthesia    mother & son have difficulty waking  . Fatigue   . Sleep apnea    uses cpap    Past Surgical History: Past Surgical History:  Procedure Laterality Date  . ABDOMINAL HYSTERECTOMY    . I&D EXTREMITY Right 09/14/2015   Procedure: IRRIGATION AND DEBRIDEMENT RIGHT MIDDLE FINGER;  Surgeon: Dominica Severin, MD;  Location: [facility] OR;  Service: Orthopedics;  Laterality: Right;  . TUBAL LIGATION      Social History: Social History   Tobacco Use  . Smoking status: Never Smoker  . Smokeless tobacco: Never Used  Substance Use Topics  . Alcohol use: Yes    Alcohol/week: 0.0 oz    Comment: occ  . Drug use: No   Additional social history: Lives at home with  daughter and sons.  Please also refer to relevant sections of EMR.  Family History: Family History  Problem Relation Age of Onset  . Hypertension Mother   . Diabetes Mellitus II Mother   . Congestive Heart Failure Father   . Hypertension Father   . Diabetes Mellitus II Father     Allergies and Medications: Allergies  Allergen Reactions  . Hydromorphone Hcl Anaphylaxis and Hives  . Meperidine Hcl Anaphylaxis and Hives    REACTION: ?anaphylaxis  . Morphine Anaphylaxis and Hives    Anything in this family REACTION: ?anaphyaxis  . Lisinopril Cough  . Sumatriptan Other (See Comments)    Makes headaches worse.   . Tape Rash    EKG LEADS BLISTER THE PATIENT'S SKIN AND LEAVE RED, RAISED AREAS!!  . Zolmitriptan Other (See Comments)    Headache   No current facility-administered medications on file prior to encounter.    Current Outpatient Medications on File Prior to Encounter  Medication Sig Dispense Refill  . amoxicillin (AMOXIL) 875 MG tablet Take 1 tablet (875 mg total) by mouth 2 (two) times daily. 20 tablet 0  . docusate sodium (COLACE) 100 MG capsule Take 1 capsule (100 mg total) by mouth 2 (two) times daily. 30 capsule 0  . Insulin Glargine (LANTUS SOLOSTAR) 100 UNIT/ML Solostar Pen Inject 60  Units into the skin daily. 1 pen 11  . insulin lispro (HUMALOG) 100 UNIT/ML KiwkPen Take 5 units If blood sugar is above 200 before meal 15 mL 11  . lamoTRIgine (LAMICTAL) 200 MG tablet Take 1 tablet (200 mg total) by mouth daily. 30 tablet 0  . metFORMIN (GLUMETZA) 1000 MG (MOD) 24 hr tablet Take 1 tablet (1,000 mg total) by mouth at bedtime. 90 tablet 0  . polyethylene glycol powder (GLYCOLAX/MIRALAX) powder Take 17 g by mouth daily as needed. 3350 g 1  . venlafaxine XR (EFFEXOR-XR) 150 MG 24 hr capsule Take 1 capsule (150 mg total) by mouth daily with breakfast. 30 capsule 3  . Vitamin D, Ergocalciferol, (DRISDOL) 50000 units CAPS capsule Take 1 capsule (50,000 Units total) by mouth  every 7 (seven) days. 8 capsule 0    Objective: BP 137/80 (BP Location: Right Arm)   Pulse 85   Temp 98.7 F (37.1 C) (Oral)   Resp 18   Ht 5\' 8"  (1.727 m)   Wt (!) 315 lb (142.9 kg)   SpO2 99%   BMI 47.90 kg/m  Exam: General: pleasant morbidly obese female lying in bed, in NAD Eyes: EOMI ENTM: oropharynx clear Cardiovascular: distant heart sounds, RRR Respiratory: distant lung sounds due to body habitus, normal WOB on RA Gastrointestinal: soft, obese abdomen, TTP in upper quadrants L>R with epigastric pain, negative murphy's sign, no tenderness over McBurney's point, no rebound, no guarding. MSK: moves all extremities spontaneously, trace LE edema Derm: No rashes or lesions identified. Neuro: alert and oriented, speech normal.  Psych: mood and affect appropriate.  Labs and Imaging: CBC BMET  Recent Labs  Lab 06/21/17 0812  WBC 8.0  HGB 12.4  HCT 38.3  PLT 212   Recent Labs  Lab 06/21/17 0812  NA 141  K 4.0  CL 106  CO2 24  BUN 7  CREATININE 0.68  GLUCOSE 204*  CALCIUM 9.6     Ct Abdomen Pelvis W Contrast  Result Date: 06/21/2017 CLINICAL DATA:  Abdominal distension, nausea, and vomiting, history type II diabetes mellitus, benign systemic hypertension EXAM: CT ABDOMEN AND PELVIS WITH CONTRAST TECHNIQUE: Multidetector CT imaging of the abdomen and pelvis was performed using the standard protocol following bolus administration of intravenous contrast. Sagittal and coronal MPR images reconstructed from axial data set. CONTRAST:  ISOVUE-300 IOPAMIDOL (ISOVUE-300) INJECTION 61% IV. No oral contrast. COMPARISON:  11/12/2006 FINDINGS: Lower chest: Minimal bibasilar atelectasis Hepatobiliary: Cholelithiasis, with a gas containing gallstone in gallbladder at least 12 mm diameter. Liver unremarkable. No biliary dilatation. Pancreas: Normal appearance Spleen: Normal appearance Adrenals/Urinary Tract: Adrenal glands, kidneys, ureters, and bladder normal appearance  Stomach/Bowel: Stomach decompressed. Appendix normal appearance. Large and small bowel loops normal appearance. No bowel dilatation or bowel wall thickening. No evidence of obstruction. Vascular/Lymphatic: Vascular structures unremarkable. No adenopathy. Pelvic phleboliths. Reproductive: Uterus surgically absent. Nonvisualization of ovaries. Other: No free air or free fluid. Tiny umbilical hernia containing fat. Musculoskeletal: Degenerative disc disease changes L4-L5. Degenerative changes of the hip joints bilaterally. IMPRESSION: Cholelithiasis. Tiny umbilical hernia containing fat. No acute intra-abdominal or intrapelvic abnormalities. Electronically Signed   By: Ulyses Southward M.D.   On: 06/21/2017 12:02   US Abdomen Limited Ruq  Result Date: 06/21/2017 CLINICAL DATA:  Right upper quadrant pain EXAM: ULTRASOUND ABDOMEN LIMITED RIGHT UPPER QUADRANT COMPARISON:  CT abdomen and pelvis June 21, 2017 FINDINGS: Gallbladder: Within the gallbladder, there are echogenic foci which move and shadow consistent with cholelithiasis. Largest gallstone measures 2.2 cm  in length. There is pericholecystic fluid with wall thickening. No sonographic Murphy sign noted by sonographer. Common bile duct: Diameter: 5 mm. No intrahepatic or extrahepatic biliary duct dilatation. Liver: No focal lesion identified. Liver echogenicity is increased. Portal vein is patent on color Doppler imaging with normal direction of blood flow towards the liver. IMPRESSION: 1. Gallstones with pericholecystic fluid and gallbladder wall thickening. These are findings felt to be indicative of a degree of acute cholecystitis. 2. Increase in liver echogenicity, a finding felt to be indicative of hepatic steatosis. While no focal liver lesions are evident on this study, it must be cautioned that sensitivity of ultrasound for detection of focal liver lesions is diminished in this circumstance. Electronically Signed   By: Bretta Bang III M.D.   On:  06/21/2017 16:21   Ellwood Dense, DO 06/21/2017, 5:41 PM PGY-1, [facility] FPTS Intern pager: [phone], text pages welcome  FPTS Upper-Level Resident Addendum  I have independently interviewed and examined the patient. I have discussed the above with the original author and agree with their documentation. My edits for correction/addition/clarification are in blue. Please see also any attending notes.   Willadean Carol, MD PGY-3, [facility] FPTS Service pager: [phone] (text pages welcome through [facility])

## 2017-06-21 NOTE — ED Triage Notes (Signed)
PT presents to emergency dept with complaints of left sided abdominal pain that radiates across top of abdomen. PT screaming in triage. She reports the pain began last night and has gotten worse. Last BM yesterday. Endorses nausea.

## 2017-06-21 NOTE — ED Notes (Signed)
Patient transported to Ultrasound 

## 2017-06-21 NOTE — Progress Notes (Signed)
Family Medicine Teaching Service Daily Progress Note Intern Pager: [phone]  Patient name: [name] [name] Medical record number: [id number] Date of birth: 1975/06/19 Age: 42 y.o. Gender: female  Primary Care Provider: Tonette Bihari, MD Consultants: Surgery Code Status: FULL  Pt Overview and Major Events to Date:  4/18: Admitted to FMTS with upper abdominal pain, likely acute cholecystitis  Assessment and Plan: [name] [name] is a 42 y.o. female presenting with epigastric and bilateral upper quadrant abdominal pain x1day. PMH is significant for obesity, T2DM, OSA on CPAP, HLD, iron deficiency anemia, Bipolar disorder, HTN, peptic ulcer disease.  Upper Abdominal Pain / Cholelithasis, concern for cholecystitis AST/ALT, bilirubin, and alk phos all normal this morning. Afebrile overnight with normal WBC count. Abdominal exam with RUQ tenderness but no true Murphy's sign. - Surgery following, appreciate recs. Planning HIDA scan today. - Continue CTX (4/18 - )  - toradol 14m q6hrs prn and fentanyl q2h PRN pain - pepcid 272mIV bid - zofran PRN  Type 2 Diabetes Last A1c 12/2016 was 10.3. On Metformin, Lantus 60u daily, and 5u Humalog meal coverage when CBG>200. A1c 8.6 this admission. CBG 178 this morning. - Hold Metformin while hospitalized - Hold Lantus while patient is NPO - sensitive SSI  Bipolar disorder Stable. On Lamictal and Effexor at home. - continue home meds  Hepatic steatosis Visualized on RUQ [place]ikely 2/2 obesity. AST/ALT within normal limits. - Monitor  OSA On CPAP at home. - CPAP qhs  FEN/GI: NPO PPx: Heparin sq  Disposition: pending surgical management  Subjective:  Patient states she is still in a lot of pain today. The pain is now isolated to the RUQ. No radiation. She still feels sweaty and clammy.  Objective: Temp:  [98.7 F (37.1 C)] 98.7 F (37.1 C) (04/18 0806) Pulse Rate:  [74-103] 103 (04/18 2134) Resp:  [16-26] 16 (04/18  2134) BP: (124-161)/(68-102) 127/68 (04/18 2134) SpO2:  [96 %-100 %] 96 % (04/18 2134) Weight:  [315 lb (142.9 kg)] 315 lb (142.9 kg) (04/18 0848) Physical Exam: General: tired-appearing, but non-toxic, laying in bed in NAD Cardiovascular: RRR, no murmurs Respiratory: CTAB, normal work of breathing Abdomen: +BS, +tenderness in the RUQ, Murphy's sign negative, no rebound, no guarding Extremities: Warm and well-perfused, no LE edema  Laboratory: Recent Labs  Lab 06/21/17 0812  WBC 8.0  HGB 12.4  HCT 38.3  PLT 212   Recent Labs  Lab 06/21/17 0812  NA 141  K 4.0  CL 106  CO2 24  BUN 7  CREATININE 0.68  CALCIUM 9.6  PROT 7.3  BILITOT 0.7  ALKPHOS 85  ALT 17  AST 17  GLUCOSE 204*    Imaging/Diagnostic Tests: -CT abdomen/pelvis: cholelithiasis, tiny umbilical hernia containing fat, no acute intra-abdominal abnormalities -RUQ USKoreagallstones with pericholecystic fluid and gallbladder wall thickening felt to be indicative of acute cholecystitis; hepatic steatosis -CXR: normal  [name] [name], [name] PeltMD 06/21/2017, 10:53 PM PGY-3, [facility] pager: [phone] pages welcome

## 2017-06-21 NOTE — ED Notes (Signed)
Patient is resting comfortably. 

## 2017-06-22 ENCOUNTER — Encounter (HOSPITAL_COMMUNITY): Payer: Self-pay | Admitting: Certified Registered Nurse Anesthetist

## 2017-06-22 ENCOUNTER — Inpatient Hospital Stay (HOSPITAL_COMMUNITY): Payer: Medicaid Other | Admitting: Anesthesiology

## 2017-06-22 ENCOUNTER — Encounter (HOSPITAL_COMMUNITY)
Admission: EM | Disposition: A | Discharge status: Home / Self Care | Payer: Self-pay | Source: Home / Self Care | Attending: Family Medicine

## 2017-06-22 DIAGNOSIS — R1010 Upper abdominal pain, unspecified: Secondary | ICD-10-CM

## 2017-06-22 HISTORY — PX: CHOLECYSTECTOMY: SHX55

## 2017-06-22 LAB — COMPREHENSIVE METABOLIC PANEL
ALT: 12 U/L — ABNORMAL LOW (ref 14–54)
ANION GAP: 10 (ref 5–15)
AST: 14 U/L — ABNORMAL LOW (ref 15–41)
Albumin: 3.1 g/dL — ABNORMAL LOW (ref 3.5–5.0)
Alkaline Phosphatase: 67 U/L (ref 38–126)
BILIRUBIN TOTAL: 0.7 mg/dL (ref 0.3–1.2)
BUN: <5 mg/dL — ABNORMAL LOW (ref 6–20)
CO2: 24 mmol/L (ref 22–32)
Calcium: 8.5 mg/dL — ABNORMAL LOW (ref 8.9–10.3)
Chloride: 103 mmol/L (ref 101–111)
Creatinine, Ser: 0.65 mg/dL (ref 0.44–1.00)
GFR calc Af Amer: >60 mL/min (ref >60)
Glucose, Bld: 186 mg/dL — ABNORMAL HIGH (ref 65–99)
POTASSIUM: 3.3 mmol/L — AB (ref 3.5–5.1)
Sodium: 137 mmol/L (ref 135–145)
TOTAL PROTEIN: 6.5 g/dL (ref 6.5–8.1)

## 2017-06-22 LAB — GLUCOSE, CAPILLARY
GLUCOSE-CAPILLARY: 177 mg/dL — AB (ref 65–99)
GLUCOSE-CAPILLARY: 205 mg/dL — AB (ref 65–99)
Glucose-Capillary: 178 mg/dL — ABNORMAL HIGH (ref 65–99)
Glucose-Capillary: 164 mg/dL — ABNORMAL HIGH (ref 65–99)
Glucose-Capillary: 264 mg/dL — ABNORMAL HIGH (ref 65–99)

## 2017-06-22 LAB — CBC
HCT: 34.1 % — ABNORMAL LOW (ref 36.0–46.0)
Hemoglobin: 11.3 g/dL — ABNORMAL LOW (ref 12.0–15.0)
MCH: 28.6 pg (ref 26.0–34.0)
MCHC: 33.1 g/dL (ref 30.0–36.0)
MCV: 86.3 fL (ref 78.0–100.0)
PLATELETS: 186 10*3/uL (ref 150–400)
RBC: 3.95 MIL/uL (ref 3.87–5.11)
RDW: 13.7 % (ref 11.5–15.5)
WBC: 8.5 10*3/uL (ref 4.0–10.5)

## 2017-06-22 LAB — HEMOGLOBIN A1C
Hgb A1c MFr Bld: 8.6 % — ABNORMAL HIGH (ref 4.8–5.6)
Mean Plasma Glucose: 200.12 mg/dL

## 2017-06-22 LAB — MRSA PCR SCREENING: MRSA BY PCR: NEGATIVE

## 2017-06-22 LAB — SURGICAL PCR SCREEN
MRSA, PCR: NEGATIVE
STAPHYLOCOCCUS AUREUS: NEGATIVE

## 2017-06-22 SURGERY — LAPAROSCOPIC CHOLECYSTECTOMY WITH INTRAOPERATIVE CHOLANGIOGRAM
Anesthesia: General | Site: Abdomen

## 2017-06-22 MED ORDER — BUPIVACAINE HCL (PF) 0.25 % IJ SOLN
INTRAMUSCULAR | Status: AC
  Filled 2017-06-22: qty 30

## 2017-06-22 MED ORDER — SUGAMMADEX SODIUM 200 MG/2ML IV SOLN
INTRAVENOUS | Status: DC | PRN
  Administered 2017-06-22: 300 mg via INTRAVENOUS

## 2017-06-22 MED ORDER — SUCCINYLCHOLINE CHLORIDE 200 MG/10ML IV SOSY
PREFILLED_SYRINGE | INTRAVENOUS | Status: AC
  Filled 2017-06-22: qty 10

## 2017-06-22 MED ORDER — PROPOFOL 10 MG/ML IV BOLUS
INTRAVENOUS | Status: AC
  Filled 2017-06-22: qty 20

## 2017-06-22 MED ORDER — MIDAZOLAM HCL 2 MG/2ML IJ SOLN
INTRAMUSCULAR | Status: AC
  Filled 2017-06-22: qty 2

## 2017-06-22 MED ORDER — FENTANYL CITRATE (PF) 250 MCG/5ML IJ SOLN
INTRAMUSCULAR | Status: DC | PRN
  Administered 2017-06-22: 100 ug via INTRAVENOUS
  Administered 2017-06-22 (×4): 50 ug via INTRAVENOUS

## 2017-06-22 MED ORDER — ROCURONIUM BROMIDE 10 MG/ML (PF) SYRINGE
PREFILLED_SYRINGE | INTRAVENOUS | Status: DC | PRN
Start: 2017-06-22 — End: 2017-06-22
  Administered 2017-06-22: 50 mg via INTRAVENOUS

## 2017-06-22 MED ORDER — WHITE PETROLATUM EX OINT
TOPICAL_OINTMENT | CUTANEOUS | Status: AC
Start: 2017-06-22 — End: 2017-06-23
  Filled 2017-06-22: qty 28.35

## 2017-06-22 MED ORDER — HYDROMORPHONE HCL 2 MG/ML IJ SOLN
0.3000 mg | INTRAMUSCULAR | Status: DC | PRN
  Administered 2017-06-22 (×2): 0.5 mg via INTRAVENOUS

## 2017-06-22 MED ORDER — LIDOCAINE 2% (20 MG/ML) 5 ML SYRINGE
INTRAMUSCULAR | Status: AC
  Filled 2017-06-22: qty 5

## 2017-06-22 MED ORDER — IOPAMIDOL (ISOVUE-300) INJECTION 61%
INTRAVENOUS | Status: AC
  Filled 2017-06-22: qty 50

## 2017-06-22 MED ORDER — PHENYLEPHRINE 40 MCG/ML (10ML) SYRINGE FOR IV PUSH (FOR BLOOD PRESSURE SUPPORT)
PREFILLED_SYRINGE | INTRAVENOUS | Status: AC
  Filled 2017-06-22: qty 10

## 2017-06-22 MED ORDER — HYDROMORPHONE HCL 2 MG/ML IJ SOLN
INTRAMUSCULAR | Status: AC
  Administered 2017-06-22: 0.5 mg via INTRAVENOUS
  Filled 2017-06-22: qty 1

## 2017-06-22 MED ORDER — ONDANSETRON HCL 4 MG/2ML IJ SOLN
INTRAMUSCULAR | Status: AC
  Filled 2017-06-22: qty 2

## 2017-06-22 MED ORDER — PROMETHAZINE HCL 25 MG/ML IJ SOLN
6.2500 mg | INTRAMUSCULAR | Status: DC | PRN
  Administered 2017-06-22: 6.25 mg via INTRAVENOUS

## 2017-06-22 MED ORDER — MIDAZOLAM HCL 2 MG/2ML IJ SOLN
INTRAMUSCULAR | Status: DC | PRN
  Administered 2017-06-22 (×2): 1 mg via INTRAVENOUS

## 2017-06-22 MED ORDER — PANTOPRAZOLE SODIUM 40 MG PO TBEC: 40.0000 mg | DELAYED_RELEASE_TABLET | Freq: Every day | ORAL | Status: DC

## 2017-06-22 MED ORDER — LACTATED RINGERS IV SOLN
INTRAVENOUS | Status: DC
  Administered 2017-06-22: 10:00:00 via INTRAVENOUS

## 2017-06-22 MED ORDER — DEXAMETHASONE SODIUM PHOSPHATE 10 MG/ML IJ SOLN
INTRAMUSCULAR | Status: DC | PRN
Start: 2017-06-22 — End: 2017-06-22
  Administered 2017-06-22: 10 mg via INTRAVENOUS

## 2017-06-22 MED ORDER — MEPERIDINE HCL 50 MG/ML IJ SOLN: 6.2500 mg | INTRAMUSCULAR | Status: DC | PRN

## 2017-06-22 MED ORDER — DEXAMETHASONE SODIUM PHOSPHATE 10 MG/ML IJ SOLN
INTRAMUSCULAR | Status: AC
  Filled 2017-06-22: qty 1

## 2017-06-22 MED ORDER — PROMETHAZINE HCL 25 MG/ML IJ SOLN
INTRAMUSCULAR | Status: AC
  Filled 2017-06-22: qty 1

## 2017-06-22 MED ORDER — PROPOFOL 10 MG/ML IV BOLUS
INTRAVENOUS | Status: DC | PRN
  Administered 2017-06-22: 200 mg via INTRAVENOUS

## 2017-06-22 MED ORDER — KETOROLAC TROMETHAMINE 30 MG/ML IJ SOLN
30.0000 mg | Freq: Four times a day (QID) | INTRAMUSCULAR | Status: DC | PRN
  Administered 2017-06-22 – 2017-06-24 (×5): 30 mg via INTRAVENOUS
  Filled 2017-06-22 (×6): qty 1

## 2017-06-22 MED ORDER — MIDAZOLAM HCL 2 MG/2ML IJ SOLN
0.5000 mg | Freq: Once | INTRAMUSCULAR | Status: AC | PRN
  Administered 2017-06-22: 1 mg via INTRAVENOUS

## 2017-06-22 MED ORDER — PHENYLEPHRINE 40 MCG/ML (10ML) SYRINGE FOR IV PUSH (FOR BLOOD PRESSURE SUPPORT)
PREFILLED_SYRINGE | INTRAVENOUS | Status: DC | PRN
  Administered 2017-06-22 (×6): 80 ug via INTRAVENOUS

## 2017-06-22 MED ORDER — HYDROMORPHONE HCL 2 MG/ML IJ SOLN
0.2500 mg | INTRAMUSCULAR | Status: DC | PRN
  Administered 2017-06-22 (×2): 0.5 mg via INTRAVENOUS

## 2017-06-22 MED ORDER — SODIUM CHLORIDE 0.9 % IR SOLN
Status: DC | PRN
  Administered 2017-06-22: 1000 mL

## 2017-06-22 MED ORDER — ONDANSETRON HCL 4 MG/2ML IJ SOLN
INTRAMUSCULAR | Status: DC | PRN
  Administered 2017-06-22 (×2): 4 mg via INTRAVENOUS

## 2017-06-22 MED ORDER — FAMOTIDINE IN NACL 20-0.9 MG/50ML-% IV SOLN
20.0000 mg | Freq: Two times a day (BID) | INTRAVENOUS | Status: DC
  Administered 2017-06-22 – 2017-06-23 (×3): 20 mg via INTRAVENOUS
  Filled 2017-06-22 (×3): qty 50

## 2017-06-22 MED ORDER — LIDOCAINE 2% (20 MG/ML) 5 ML SYRINGE
INTRAMUSCULAR | Status: DC | PRN
Start: 2017-06-22 — End: 2017-06-22
  Administered 2017-06-22: 60 mg via INTRAVENOUS

## 2017-06-22 MED ORDER — ROCURONIUM BROMIDE 10 MG/ML (PF) SYRINGE
PREFILLED_SYRINGE | INTRAVENOUS | Status: AC
  Filled 2017-06-22: qty 5

## 2017-06-22 MED ORDER — 0.9 % SODIUM CHLORIDE (POUR BTL) OPTIME
TOPICAL | Status: DC | PRN
  Administered 2017-06-22: 200 mL

## 2017-06-22 MED ORDER — BUPIVACAINE HCL 0.25 % IJ SOLN
INTRAMUSCULAR | Status: DC | PRN
  Administered 2017-06-22: 30 mL

## 2017-06-22 MED ORDER — FENTANYL CITRATE (PF) 250 MCG/5ML IJ SOLN
INTRAMUSCULAR | Status: AC
  Filled 2017-06-22: qty 5

## 2017-06-22 MED ORDER — FENTANYL CITRATE (PF) 250 MCG/5ML IJ SOLN: INTRAMUSCULAR | Status: DC | PRN

## 2017-06-22 MED ORDER — STERILE WATER FOR IRRIGATION IR SOLN
Status: DC | PRN
  Administered 2017-06-22: 200 mL

## 2017-06-22 SURGICAL SUPPLY — 39 items
ADH SKN CLS APL DERMABOND .7 (GAUZE/BANDAGES/DRESSINGS) ×1
APPLIER CLIP ROT 10 11.4 M/L (STAPLE)
APR CLP MED LRG 11.4X10 (STAPLE)
BAG SPEC RTRVL 10 TROC 200 (ENDOMECHANICALS) ×1
CANISTER SUCT 3000ML PPV (MISCELLANEOUS) ×2 IMPLANT
CHLORAPREP W/TINT 26ML (MISCELLANEOUS) ×2 IMPLANT
CLIP APPLIE ROT 10 11.4 M/L (STAPLE) IMPLANT
CLIP LIGATING HEMO O LOK GREEN (MISCELLANEOUS) ×2 IMPLANT
CLIP VESOLOCK XL 6/CT (CLIP) IMPLANT
COVER MAYO STAND STRL (DRAPES) ×2 IMPLANT
COVER SURGICAL LIGHT HANDLE (MISCELLANEOUS) ×2 IMPLANT
DERMABOND ADVANCED (GAUZE/BANDAGES/DRESSINGS) ×1
DERMABOND ADVANCED .7 DNX12 (GAUZE/BANDAGES/DRESSINGS) ×1 IMPLANT
ELECT REM PT RETURN 9FT ADLT (ELECTROSURGICAL) ×2
ELECTRODE REM PT RTRN 9FT ADLT (ELECTROSURGICAL) ×1 IMPLANT
GLOVE BIOGEL PI IND STRL 7.0 (GLOVE) ×1 IMPLANT
GLOVE BIOGEL PI INDICATOR 7.0 (GLOVE) ×1
GLOVE SURG SS PI 7.0 STRL IVOR (GLOVE) ×2 IMPLANT
GOWN STRL REUS W/ TWL LRG LVL3 (GOWN DISPOSABLE) ×3 IMPLANT
GOWN STRL REUS W/TWL LRG LVL3 (GOWN DISPOSABLE) ×6
GRASPER SUT TROCAR 14GX15 (MISCELLANEOUS) ×2 IMPLANT
KIT BASIN OR (CUSTOM PROCEDURE TRAY) ×2 IMPLANT
KIT TURNOVER KIT B (KITS) ×2 IMPLANT
NEEDLE 22X1 1/2 (OR ONLY) (NEEDLE) ×2 IMPLANT
NS IRRIG 1000ML POUR BTL (IV SOLUTION) ×2 IMPLANT
POUCH RETRIEVAL ECOSAC 10 (ENDOMECHANICALS) ×1 IMPLANT
POUCH RETRIEVAL ECOSAC 10MM (ENDOMECHANICALS) ×1
SCISSORS LAP 5X35 DISP (ENDOMECHANICALS) ×2 IMPLANT
SET IRRIG TUBING LAPAROSCOPIC (IRRIGATION / IRRIGATOR) ×2 IMPLANT
SLEEVE ENDOPATH XCEL 5M (ENDOMECHANICALS) ×4 IMPLANT
SPECIMEN JAR SMALL (MISCELLANEOUS) ×2 IMPLANT
SUT MNCRL AB 4-0 PS2 18 (SUTURE) ×2 IMPLANT
TOWEL OR 17X24 6PK STRL BLUE (TOWEL DISPOSABLE) ×2 IMPLANT
TOWEL OR 17X26 10 PK STRL BLUE (TOWEL DISPOSABLE) ×2 IMPLANT
TRAY LAPAROSCOPIC MC (CUSTOM PROCEDURE TRAY) ×2 IMPLANT
TROCAR XCEL 12X100 BLDLESS (ENDOMECHANICALS) ×2 IMPLANT
TROCAR XCEL NON-BLD 5MMX100MML (ENDOMECHANICALS) ×2 IMPLANT
TUBING INSUFFLATION (TUBING) ×2 IMPLANT
WATER STERILE IRR 1000ML POUR (IV SOLUTION) ×2 IMPLANT

## 2017-06-22 NOTE — Progress Notes (Signed)
Pt has CPAP set up at bedside. She states she can place herself on/off per home use. I told her to call if she needs any further assistance.

## 2017-06-22 NOTE — Progress Notes (Signed)
Subjective/Chief Complaint: Pain now mostly RUQ   Objective: Vital signs in last 24 hours: Temp:  [98.6 F (37 C)-98.8 F (37.1 C)] 98.8 F (37.1 C) (04/19 0553) Pulse Rate:  [74-105] 101 (04/19 0553) Resp:  [16-20] 16 (04/18 2134) BP: (108-137)/(67-87) 108/67 (04/19 0553) SpO2:  [96 %-100 %] 96 % (04/19 0553) Weight:  [146.2 kg (322 lb 5 oz)] 146.2 kg (322 lb 5 oz) (04/19 0208) Last BM Date: 06/20/17  Intake/Output from previous day: 04/18 0701 - 04/19 0700 In: 1775 [I.V.:675; IV Piggyback:1100] Out: -  Intake/Output this shift: No intake/output data recorded.  General appearance: alert and cooperative Resp: clear to auscultation bilaterally Cardio: regular rate and rhythm GI: soft, tender RUQ, LUQ NT  Lab Results:  Recent Labs    06/21/17 0812 06/22/17 0626  WBC 8.0 8.5  HGB 12.4 11.3*  HCT 38.3 34.1*  PLT 212 186   BMET Recent Labs    06/21/17 0812 06/22/17 0626  NA 141 137  K 4.0 3.3*  CL 106 103  CO2 24 24  GLUCOSE 204* 186*  BUN 7 <5*  CREATININE 0.68 0.65  CALCIUM 9.6 8.5*   PT/INR No results for input(s): LABPROT, INR in the last 72 hours. ABG No results for input(s): PHART, HCO3 in the last 72 hours.  Invalid input(s): PCO2, PO2  Studies/Results: Dg Chest 2 View  Result Date: 06/21/2017 CLINICAL DATA:  Acute onset of upper abdominal pain, nausea and fever. EXAM: CHEST - 2 VIEW COMPARISON:  Chest radiograph performed 09/18/2013, and CT of the chest performed 09/30/2013 FINDINGS: The lungs are well-aerated and clear. There is no evidence of focal opacification, pleural effusion or pneumothorax. The heart is normal in size; the mediastinal contour is within normal limits. No acute osseous abnormalities are seen. IMPRESSION: No acute cardiopulmonary process seen. Electronically Signed   By: Roanna RaiderJeffery  Chang M.D.   On: 06/21/2017 22:23   Ct Abdomen Pelvis W Contrast  Result Date: 06/21/2017 CLINICAL DATA:  Abdominal distension, nausea, and  vomiting, history type II diabetes mellitus, benign systemic hypertension EXAM: CT ABDOMEN AND PELVIS WITH CONTRAST TECHNIQUE: Multidetector CT imaging of the abdomen and pelvis was performed using the standard protocol following bolus administration of intravenous contrast. Sagittal and coronal MPR images reconstructed from axial data set. CONTRAST:  100mL ISOVUE-300 IOPAMIDOL (ISOVUE-300) INJECTION 61% IV. No oral contrast. COMPARISON:  11/12/2006 FINDINGS: Lower chest: Minimal bibasilar atelectasis Hepatobiliary: Cholelithiasis, with a gas containing gallstone in gallbladder at least 12 mm diameter. Liver unremarkable. No biliary dilatation. Pancreas: Normal appearance Spleen: Normal appearance Adrenals/Urinary Tract: Adrenal glands, kidneys, ureters, and bladder normal appearance Stomach/Bowel: Stomach decompressed. Appendix normal appearance. Large and small bowel loops normal appearance. No bowel dilatation or bowel wall thickening. No evidence of obstruction. Vascular/Lymphatic: Vascular structures unremarkable. No adenopathy. Pelvic phleboliths. Reproductive: Uterus surgically absent. Nonvisualization of ovaries. Other: No free air or free fluid. Tiny umbilical hernia containing fat. Musculoskeletal: Degenerative disc disease changes L4-L5. Degenerative changes of the hip joints bilaterally. IMPRESSION: Cholelithiasis. Tiny umbilical hernia containing fat. No acute intra-abdominal or intrapelvic abnormalities. Electronically Signed   By: Ulyses SouthwardMark  Boles M.D.   On: 06/21/2017 12:02   [place] Abdomen Limited Ruq  Result Date: 06/21/2017 CLINICAL DATA:  Right upper quadrant pain EXAM: ULTRASOUND ABDOMEN LIMITED RIGHT UPPER QUADRANT COMPARISON:  CT abdomen and pelvis June 21, 2017 FINDINGS: Gallbladder: Within the gallbladder, there are echogenic foci which move and shadow consistent with cholelithiasis. Largest gallstone measures 2.2 cm in length. There is pericholecystic fluid with  wall thickening. No sonographic  Murphy sign noted by sonographer. Common bile duct: Diameter: 5 mm. No intrahepatic or extrahepatic biliary duct dilatation. Liver: No focal lesion identified. Liver echogenicity is increased. Portal vein is patent on color Doppler imaging with normal direction of blood flow towards the liver. IMPRESSION: 1. Gallstones with pericholecystic fluid and gallbladder wall thickening. These are findings felt to be indicative of a degree of acute cholecystitis. 2. Increase in liver echogenicity, a finding felt to be indicative of hepatic steatosis. While no focal liver lesions are evident on this study, it must be cautioned that sensitivity of ultrasound for detection of focal liver lesions is diminished in this circumstance. Electronically Signed   By: Bretta Bang III M.D.   On: 06/21/2017 16:21    Anti-infectives: Anti-infectives (From admission, onward)   Start     Dose/Rate Route Frequency Ordered Stop   06/21/17 2030  cefTRIAXone (ROCEPHIN) 1 g in sodium chloride 0.9 % 100 mL IVPB     1 g 200 mL/hr over 30 Minutes Intravenous Every 24 hours 06/21/17 2010        Assessment/Plan: Cholecystitis with cholelithiasis - to OR today for laparoscopic cholecystectomy, possible IOC by Dr. Sheliah Hatch. Procedure, risks, and benefits discussed and she agrees. Continue Rocephin. I also spoke with her mother.  LOS: 1 day    Liz Malady 06/22/2017

## 2017-06-22 NOTE — Transfer of Care (Signed)
Immediate Anesthesia Transfer of Care Note  Patient: [name] [name]  Procedure(s) Performed: LAPAROSCOPIC CHOLECYSTECTOMY (N/A Abdomen)  Patient Location: PACU  Anesthesia Type:General  Level of Consciousness: drowsy and patient cooperative  Airway & Oxygen Therapy: Patient Spontanous Breathing and Patient connected to nasal cannula oxygen  Post-op Assessment: Report given to RN, Post -op Vital signs reviewed and stable and Patient moving all extremities X 4  Post vital signs: Reviewed and stable  Last Vitals:  Vitals Value Taken Time  BP 117/75 06/22/2017 12:36 PM  Temp    Pulse 91 06/22/2017 12:42 PM  Resp 22 06/22/2017 12:42 PM  SpO2 100 % 06/22/2017 12:42 PM  Vitals shown include unvalidated device data.  Last Pain:  Vitals:   06/22/17 0946  TempSrc: Oral  PainSc:       Patients Stated Pain Goal: 0 (06/21/17 0850)  Complications: No apparent anesthesia complications

## 2017-06-22 NOTE — Anesthesia Procedure Notes (Signed)
Procedure Name: Intubation Date/Time: 06/22/2017 11:22 AM Performed by: Julieta Bellini, CRNA Pre-anesthesia Checklist: Patient identified, Emergency Drugs available, Suction available and Patient being monitored Patient Re-evaluated:Patient Re-evaluated prior to induction Oxygen Delivery Method: Circle system utilized Preoxygenation: Pre-oxygenation with 100% oxygen Induction Type: IV induction Ventilation: Mask ventilation without difficulty Laryngoscope Size: Mac and 4 Grade View: Grade II Tube type: Oral Tube size: 7.0 mm Number of attempts: 1 Airway Equipment and Method: Stylet Placement Confirmation: positive ETCO2,  breath sounds checked- equal and bilateral and ETT inserted through vocal cords under direct vision Secured at: 22 cm Tube secured with: Tape Dental Injury: Teeth and Oropharynx as per pre-operative assessment

## 2017-06-22 NOTE — Anesthesia Postprocedure Evaluation (Signed)
Anesthesia Post Note  Patient: [name] PatrickRhonda N [name]  Procedure(s) Performed: LAPAROSCOPIC CHOLECYSTECTOMY (N/A Abdomen)     Patient location during evaluation: PACU Anesthesia Type: General Level of consciousness: sedated, oriented and patient cooperative Pain management: pain level controlled Vital Signs Assessment: post-procedure vital signs reviewed and stable Respiratory status: spontaneous breathing, nonlabored ventilation, respiratory function stable and patient connected to nasal cannula oxygen Cardiovascular status: blood pressure returned to baseline and stable Postop Assessment: no apparent nausea or vomiting Anesthetic complications: no    Last Vitals:  Vitals:   06/22/17 1235 06/22/17 1245  BP:  126/78  Pulse:  94  Resp:  18  Temp: (!) 36.4 C   SpO2: 100% 100%    Last Pain:  Vitals:   06/22/17 0946  TempSrc: Oral  PainSc:                  Lonni Dirden,E. Ajna Moors

## 2017-06-22 NOTE — Anesthesia Preprocedure Evaluation (Addendum)
Anesthesia Evaluation  Patient identified by MRN, date of birth, ID band Patient awake    Reviewed: Allergy & Precautions, NPO status , Patient's Chart, lab work & pertinent test results  History of Anesthesia Complications Negative for: history of anesthetic complications  Airway Mallampati: II   Neck ROM: Full    Dental  (+) Dental Advisory Given   Pulmonary sleep apnea and Continuous Positive Airway Pressure Ventilation ,    breath sounds clear to auscultation       Cardiovascular negative cardio ROS   Rhythm:Regular Rate:Normal     Neuro/Psych  Headaches, Anxiety Depression Bipolar Disorder    GI/Hepatic Neg liver ROS, GERD  Medicated and Controlled,  Endo/Other  diabetes (glu 164), Insulin Dependent, Oral Hypoglycemic Agents  Renal/GU negative Renal ROS     Musculoskeletal   Abdominal (+) + obese,   Peds  Hematology negative hematology ROS (+)   Anesthesia Other Findings   Reproductive/Obstetrics                            Anesthesia Physical Anesthesia Plan  ASA: III  Anesthesia Plan: General   Post-op Pain Management:    Induction: Intravenous  PONV Risk Score and Plan: 4 or greater and Ondansetron, Dexamethasone and Scopolamine patch - Pre-op  Airway Management Planned: Oral ETT  Additional Equipment:   Intra-op Plan:   Post-operative Plan: Extubation in OR  Informed Consent: I have reviewed the patients History and Physical, chart, labs and discussed the procedure including the risks, benefits and alternatives for the proposed anesthesia with the patient or authorized representative who has indicated his/her understanding and acceptance.   Dental advisory given  Plan Discussed with: CRNA and Surgeon  Anesthesia Plan Comments: (Plan routine monitors, GETA)        Anesthesia Quick Evaluation

## 2017-06-22 NOTE — Op Note (Signed)
PATIENT:  [name] [name]  42 y.o. female  PRE-OPERATIVE DIAGNOSIS:  Cholelithiasis  POST-OPERATIVE DIAGNOSIS:  Cholelithiasis  PROCEDURE:  Procedure(s): LAPAROSCOPIC CHOLECYSTECTOMY   SURGEON:  Surgeon(s): Kinsinger, De BlanchLuke Aaron, MD  ASSISTANT: none  ANESTHESIA:   local and general  Indications for procedure: [name] PatrickRhonda N Bartolo is a 42 y.o. female with symptoms of Abdominal pain and Nausea and vomiting consistent with gallbladder disease, Confirmed by Ultrasound.  Description of procedure: The patient was brought into the operative suite, placed supine. Anesthesia was administered with endotracheal tube. Patient was strapped in place and foot board was secured. All pressure points were offloaded by foam padding. The patient was prepped and draped in the usual sterile fashion.  A small incision was made to the right of the umbilicus. A 5mm trocar was inserted into the peritoneal cavity with optical entry. Pneumoperitoneum was applied with high flow low pressure. 2 5mm trocars were placed in the RUQ. A 12mm trocar was placed in the subxiphoid space. 30 ml marcaine was infused to the subxiphoid space and lateral upper right abdomen in the preperitoneal spaces. Next the patient was placed in reverse trendelenberg. The gallbladder was edematous and tense and green in color. A drainage catheter was used to remove a large amount of bile in order to retract the gallbladder.  The gallbladder was retracted cephalad and lateral. The peritoneum was reflected off the infundibulum working lateral to medial. The cystic duct and cystic artery were identified and further dissection revealed a critical view. The cystic duct and cystic artery were doubly clipped and ligated.   The gallbladder was removed off the liver bed with cautery. The Gallbladder was placed in a specimen bag. The gallbladder fossa was irrigated and hemostasis was applied with cautery. The gallbladder was removed via the 12mm trocar. The  fascial defect was closed with interrupted 0 vicryl suture via laparoscopic trans-fascial suture passer. Pneumoperitoneum was removed, all trocar were removed. All incisions were closed with 4-0 monocryl subcuticular stitch. The patient woke from anesthesia and was brought to PACU in stable condition. All counts were correct  Findings: inflamed gallbladder with enlarged   Specimen: gallbladder  Blood loss: 30 ml  Local anesthesia: 30 ml marcaine  Complications: none  PLAN OF CARE: Admit to inpatient   PATIENT DISPOSITION:  PACU - hemodynamically stable.  Feliciana RossettiLuke Kinsinger, M.D. General, Bariatric, & Minimally Invasive Surgery [facility]

## 2017-06-22 NOTE — Progress Notes (Signed)
Pre Procedure note for inpatients:   [name] [name] has been scheduled for Procedure(s): LAPAROSCOPIC CHOLECYSTECTOMY WITH POSSIBLE INTRAOPERATIVE CHOLANGIOGRAM (N/A) today. The various methods of treatment have been discussed with the patient. After consideration of the risks, benefits and treatment options the patient has consented to the planned procedure.   The patient has been seen and labs reviewed. There are no changes in the patient's condition to prevent proceeding with the planned procedure today.  Recent labs:  Lab Results  Component Value Date   WBC 8.5 06/22/2017   HGB 11.3 (L) 06/22/2017   HCT 34.1 (L) 06/22/2017   PLT 186 06/22/2017   GLUCOSE 186 (H) 06/22/2017   CHOL 208 (H) 11/09/2015   TRIG 311 (H) 11/09/2015   HDL 53 11/09/2015   LDLDIRECT 130 (H) 08/30/2009   LDLCALC 93 11/09/2015   ALT 12 (L) 06/22/2017   AST 14 (L) 06/22/2017   NA 137 06/22/2017   K 3.3 (L) 06/22/2017   CL 103 06/22/2017   CREATININE 0.65 06/22/2017   BUN <5 (L) 06/22/2017   CO2 24 06/22/2017   TSH 1.961 10/29/2012   INR 1.08 09/14/2015   HGBA1C 8.6 (H) 06/22/2017    Rodman PickleLuke Aaron Kinsinger, MD 06/22/2017 10:26 AM

## 2017-06-23 ENCOUNTER — Encounter (HOSPITAL_COMMUNITY): Payer: Self-pay | Admitting: General Surgery

## 2017-06-23 LAB — COMPREHENSIVE METABOLIC PANEL
ALT: 28 U/L (ref 14–54)
ANION GAP: 9 (ref 5–15)
AST: 46 U/L — ABNORMAL HIGH (ref 15–41)
Albumin: 2.8 g/dL — ABNORMAL LOW (ref 3.5–5.0)
Alkaline Phosphatase: 66 U/L (ref 38–126)
BUN: 9 mg/dL (ref 6–20)
CHLORIDE: 106 mmol/L (ref 101–111)
CO2: 23 mmol/L (ref 22–32)
Calcium: 8.2 mg/dL — ABNORMAL LOW (ref 8.9–10.3)
Creatinine, Ser: 0.73 mg/dL (ref 0.44–1.00)
Glucose, Bld: 210 mg/dL — ABNORMAL HIGH (ref 65–99)
POTASSIUM: 3.3 mmol/L — AB (ref 3.5–5.1)
SODIUM: 138 mmol/L (ref 135–145)
Total Bilirubin: 0.4 mg/dL (ref 0.3–1.2)
Total Protein: 6.5 g/dL (ref 6.5–8.1)

## 2017-06-23 LAB — CBC
HCT: 32.4 % — ABNORMAL LOW (ref 36.0–46.0)
Hemoglobin: 10.4 g/dL — ABNORMAL LOW (ref 12.0–15.0)
MCH: 27.9 pg (ref 26.0–34.0)
MCHC: 32.1 g/dL (ref 30.0–36.0)
MCV: 86.9 fL (ref 78.0–100.0)
PLATELETS: 191 10*3/uL (ref 150–400)
RBC: 3.73 MIL/uL — AB (ref 3.87–5.11)
RDW: 13.8 % (ref 11.5–15.5)
WBC: 9.7 10*3/uL (ref 4.0–10.5)

## 2017-06-23 LAB — HIV ANTIBODY (ROUTINE TESTING W REFLEX): HIV SCREEN 4TH GENERATION: NONREACTIVE

## 2017-06-23 LAB — GLUCOSE, CAPILLARY
GLUCOSE-CAPILLARY: 195 mg/dL — AB (ref 65–99)
GLUCOSE-CAPILLARY: 225 mg/dL — AB (ref 65–99)
Glucose-Capillary: 243 mg/dL — ABNORMAL HIGH (ref 65–99)
Glucose-Capillary: 245 mg/dL — ABNORMAL HIGH (ref 65–99)

## 2017-06-23 MED ORDER — SODIUM CHLORIDE 0.9 % IV SOLN: 250.0000 mL | INTRAVENOUS | Status: DC | PRN

## 2017-06-23 MED ORDER — INSULIN ASPART 100 UNIT/ML ~~LOC~~ SOLN
0.0000 [IU] | Freq: Three times a day (TID) | SUBCUTANEOUS | Status: DC
  Administered 2017-06-23 (×2): 5 [IU] via SUBCUTANEOUS
  Administered 2017-06-24: 3 [IU] via SUBCUTANEOUS

## 2017-06-23 MED ORDER — POTASSIUM CHLORIDE CRYS ER 20 MEQ PO TBCR
40.0000 meq | EXTENDED_RELEASE_TABLET | Freq: Once | ORAL | Status: AC
  Administered 2017-06-23: 40 meq via ORAL
  Filled 2017-06-23: qty 2

## 2017-06-23 MED ORDER — SODIUM CHLORIDE 0.9% FLUSH
3.0000 mL | Freq: Two times a day (BID) | INTRAVENOUS | Status: DC
  Administered 2017-06-23: 3 mL via INTRAVENOUS

## 2017-06-23 MED ORDER — SODIUM CHLORIDE 0.9% FLUSH: 3.0000 mL | INTRAVENOUS | Status: DC | PRN

## 2017-06-23 NOTE — Progress Notes (Signed)
Pt places elf on/off cpap when ready.  Aware to call if she needs assistance.  RT will continue to monitor.

## 2017-06-23 NOTE — Progress Notes (Signed)
Family Medicine Teaching Service Daily Progress Note Intern Pager: [phone]  Patient name: [name] [name] Medical record number: [id number] Date of birth: 12/28/1975 Age: 42 y.o. Gender: female  Primary Care Provider: Berton BonMikell, Asiyah Zahra, MD Consultants: Surgery Code Status: FULL  Pt Overview and Major Events to Date:  4/18: Admitted to FMTS with upper abdominal pain, likely acute cholecystitis 4/19: lap chole  Assessment and Plan: [name] [name] is a 42 y.o. female presenting with epigastric and bilateral upper quadrant abdominal pain x1day. PMH is significant for obesity, T2DM, OSA on CPAP, HLD, iron deficiency anemia, Bipolar disorder, HTN, peptic ulcer disease.  Acute Cholecystitis s/p lap chole  POD #1. Afebrile with stable vitals. No leukocytosis. AST slightly elevated although likely inflammatory s/p surgery. Received one dose PRN fentanyl and toradol overnight. - Surgery following, appreciate recs.  - Continue CTX (4/18 - )  - toradol 30mg  q6hrs prn and fentanyl q2h PRN pain - pepcid 20mg  IV bid - zofran PRN  Type 2 Diabetes Last A1c 12/2016 was 10.3. On Metformin, Lantus 60u daily, and 5u Humalog meal coverage when CBG>200. A1c 8.6 this admission. CBG 195 this morning. 7u aspart received in the last 24 hours. - Hold Metformin and Lantus  - moderate SSI  Hypokalemia K 3.3 this am. - replete and monitor  Anemia Hb 10.4 this am, down from 12.4. Baseline ~11. Asymptomatic. - monitor - recheck in the am  Bipolar disorder Stable. On Lamictal and Effexor at home. - continue home meds  Hepatic steatosis Visualized on RUQ US. Likely 2/2 obesity. AST slightly elevated this morning. - Monitor  OSA On CPAP at home. - CPAP qhs  FEN/GI: soft diet, ADAT PPx: Heparin sq  Disposition: pending surgical management  Subjective:  Patient feeling better this morning. Not currently in pain. Wants to advance diet.   Objective: Temp:  [97.5 F (36.4 C)-98.8 F  (37.1 C)] 98.8 F (37.1 C) (04/20 0825) Pulse Rate:  [83-95] 83 (04/20 0825) Resp:  [15-23] 16 (04/20 0825) BP: (101-131)/(74-87) 122/75 (04/20 0825) SpO2:  [96 %-100 %] 100 % (04/20 0825) Physical Exam: General: obese female sitting in chair, in NAD Cardiovascular: RRR, no mrg Respiratory: CTAB, no wheezes/rhonchi/crackles, normal WOB on RA Abdomen: +BS, soft, nontender to palpation, surgical sites clean, dry, no erythema or warmth appreciated. Extremities: Warm and well-perfused, no LE edema  Laboratory: Recent Labs  Lab 06/21/17 0812 06/22/17 0626 06/23/17 0727  WBC 8.0 8.5 9.7  HGB 12.4 11.3* 10.4*  HCT 38.3 34.1* 32.4*  PLT 212 186 191   Recent Labs  Lab 06/21/17 0812 06/22/17 0626 06/23/17 0727  NA 141 137 138  K 4.0 3.3* 3.3*  CL 106 103 106  CO2 24 24 23   BUN 7 <5* 9  CREATININE 0.68 0.65 0.73  CALCIUM 9.6 8.5* 8.2*  PROT 7.3 6.5 6.5  BILITOT 0.7 0.7 0.4  ALKPHOS 85 67 66  ALT 17 12* 28  AST 17 14* 46*  GLUCOSE 204* 186* 210*    Imaging/Diagnostic Tests: -CT abdomen/pelvis: cholelithiasis, tiny umbilical hernia containing fat, no acute intra-abdominal abnormalities -RUQ US: gallstones with pericholecystic fluid and gallbladder wall thickening felt to be indicative of acute cholecystitis; hepatic steatosis -CXR: normal  Ellwood DenseRumball, Juliya Magill, DO 06/23/2017, 9:48 AM PGY-1, [place] [facility] FPTS Intern pager: [phone], text pages welcome

## 2017-06-23 NOTE — Progress Notes (Signed)
1 Day Post-Op   Subjective/Chief Complaint: Up in chair, doing well   Objective: Vital signs in last 24 hours: Temp:  [97.5 F (36.4 C)-98.8 F (37.1 C)] 98.8 F (37.1 C) (04/20 0825) Pulse Rate:  [83-95] 83 (04/20 0825) Resp:  [15-23] 16 (04/20 0825) BP: (101-131)/(74-87) 122/75 (04/20 0825) SpO2:  [96 %-100 %] 100 % (04/20 0825) Last BM Date: 06/20/17  Intake/Output from previous day: 04/19 0701 - 04/20 0700 In: 1500 [I.V.:1500] Out: 15 [Blood:15] Intake/Output this shift: No intake/output data recorded.  General appearance: alert and cooperative GI: soft, incisions CDI  Lab Results:  Recent Labs    06/22/17 0626 06/23/17 0727  WBC 8.5 9.7  HGB 11.3* 10.4*  HCT 34.1* 32.4*  PLT 186 191   BMET Recent Labs    06/22/17 0626 06/23/17 0727  NA 137 138  K 3.3* 3.3*  CL 103 106  CO2 24 23  GLUCOSE 186* 210*  BUN <5* 9  CREATININE 0.65 0.73  CALCIUM 8.5* 8.2*   PT/INR No results for input(s): LABPROT, INR in the last 72 hours. ABG No results for input(s): PHART, HCO3 in the last 72 hours.  Invalid input(s): PCO2, PO2  Studies/Results: Dg Chest 2 View  Result Date: 06/21/2017 CLINICAL DATA:  Acute onset of upper abdominal pain, nausea and fever. EXAM: CHEST - 2 VIEW COMPARISON:  Chest radiograph performed 09/18/2013, and CT of the chest performed 09/30/2013 FINDINGS: The lungs are well-aerated and clear. There is no evidence of focal opacification, pleural effusion or pneumothorax. The heart is normal in size; the mediastinal contour is within normal limits. No acute osseous abnormalities are seen. IMPRESSION: No acute cardiopulmonary process seen. Electronically Signed   By: Roanna RaiderJeffery  Chang M.D.   On: 06/21/2017 22:23   Ct Abdomen Pelvis W Contrast  Result Date: 06/21/2017 CLINICAL DATA:  Abdominal distension, nausea, and vomiting, history type II diabetes mellitus, benign systemic hypertension EXAM: CT ABDOMEN AND PELVIS WITH CONTRAST TECHNIQUE:  Multidetector CT imaging of the abdomen and pelvis was performed using the standard protocol following bolus administration of intravenous contrast. Sagittal and coronal MPR images reconstructed from axial data set. CONTRAST:  100mL ISOVUE-300 IOPAMIDOL (ISOVUE-300) INJECTION 61% IV. No oral contrast. COMPARISON:  11/12/2006 FINDINGS: Lower chest: Minimal bibasilar atelectasis Hepatobiliary: Cholelithiasis, with a gas containing gallstone in gallbladder at least 12 mm diameter. Liver unremarkable. No biliary dilatation. Pancreas: Normal appearance Spleen: Normal appearance Adrenals/Urinary Tract: Adrenal glands, kidneys, ureters, and bladder normal appearance Stomach/Bowel: Stomach decompressed. Appendix normal appearance. Large and small bowel loops normal appearance. No bowel dilatation or bowel wall thickening. No evidence of obstruction. Vascular/Lymphatic: Vascular structures unremarkable. No adenopathy. Pelvic phleboliths. Reproductive: Uterus surgically absent. Nonvisualization of ovaries. Other: No free air or free fluid. Tiny umbilical hernia containing fat. Musculoskeletal: Degenerative disc disease changes L4-L5. Degenerative changes of the hip joints bilaterally. IMPRESSION: Cholelithiasis. Tiny umbilical hernia containing fat. No acute intra-abdominal or intrapelvic abnormalities. Electronically Signed   By: Ulyses SouthwardMark  Boles M.D.   On: 06/21/2017 12:02   [place] Abdomen Limited Ruq  Result Date: 06/21/2017 CLINICAL DATA:  Right upper quadrant pain EXAM: ULTRASOUND ABDOMEN LIMITED RIGHT UPPER QUADRANT COMPARISON:  CT abdomen and pelvis June 21, 2017 FINDINGS: Gallbladder: Within the gallbladder, there are echogenic foci which move and shadow consistent with cholelithiasis. Largest gallstone measures 2.2 cm in length. There is pericholecystic fluid with wall thickening. No sonographic Murphy sign noted by sonographer. Common bile duct: Diameter: 5 mm. No intrahepatic or extrahepatic biliary duct dilatation.  Liver: No  focal lesion identified. Liver echogenicity is increased. Portal vein is patent on color Doppler imaging with normal direction of blood flow towards the liver. IMPRESSION: 1. Gallstones with pericholecystic fluid and gallbladder wall thickening. These are findings felt to be indicative of a degree of acute cholecystitis. 2. Increase in liver echogenicity, a finding felt to be indicative of hepatic steatosis. While no focal liver lesions are evident on this study, it must be cautioned that sensitivity of ultrasound for detection of focal liver lesions is diminished in this circumstance. Electronically Signed   By: Bretta Bang III M.D.   On: 06/21/2017 16:21    Anti-infectives: Anti-infectives (From admission, onward)   Start     Dose/Rate Route Frequency Ordered Stop   06/21/17 2030  cefTRIAXone (ROCEPHIN) 1 g in sodium chloride 0.9 % 100 mL IVPB     1 g 200 mL/hr over 30 Minutes Intravenous Every 24 hours 06/21/17 2010        Assessment/Plan: s/p Procedure(s): LAPAROSCOPIC CHOLECYSTECTOMY (N/A) POD#1 OK to D/C from our standpoint F/U in chart   LOS: 2 days    Liz Malady 06/23/2017

## 2017-06-24 LAB — CBC
HCT: 30.4 % — ABNORMAL LOW (ref 36.0–46.0)
Hemoglobin: 9.8 g/dL — ABNORMAL LOW (ref 12.0–15.0)
MCH: 28 pg (ref 26.0–34.0)
MCHC: 32.2 g/dL (ref 30.0–36.0)
MCV: 86.9 fL (ref 78.0–100.0)
PLATELETS: 189 10*3/uL (ref 150–400)
RBC: 3.5 MIL/uL — AB (ref 3.87–5.11)
RDW: 14 % (ref 11.5–15.5)
WBC: 9.1 10*3/uL (ref 4.0–10.5)

## 2017-06-24 LAB — COMPREHENSIVE METABOLIC PANEL
ALBUMIN: 2.7 g/dL — AB (ref 3.5–5.0)
ALK PHOS: 59 U/L (ref 38–126)
ALT: 28 U/L (ref 14–54)
AST: 31 U/L (ref 15–41)
Anion gap: 10 (ref 5–15)
BUN: 10 mg/dL (ref 6–20)
CALCIUM: 8.5 mg/dL — AB (ref 8.9–10.3)
CHLORIDE: 107 mmol/L (ref 101–111)
CO2: 24 mmol/L (ref 22–32)
CREATININE: 0.74 mg/dL (ref 0.44–1.00)
GFR calc non Af Amer: >60 mL/min (ref >60)
GLUCOSE: 167 mg/dL — AB (ref 65–99)
Potassium: 3.7 mmol/L (ref 3.5–5.1)
SODIUM: 141 mmol/L (ref 135–145)
Total Bilirubin: 0.4 mg/dL (ref 0.3–1.2)
Total Protein: 6.3 g/dL — ABNORMAL LOW (ref 6.5–8.1)

## 2017-06-24 LAB — GLUCOSE, CAPILLARY: Glucose-Capillary: 168 mg/dL — ABNORMAL HIGH (ref 65–99)

## 2017-06-24 MED ORDER — KETOROLAC TROMETHAMINE 10 MG PO TABS: 10.0000 mg | ORAL_TABLET | Freq: Four times a day (QID) | ORAL | 0 refills | Status: DC | PRN

## 2017-06-24 MED ORDER — ONDANSETRON HCL 4 MG PO TABS: 4.0000 mg | ORAL_TABLET | Freq: Four times a day (QID) | ORAL | 0 refills | Status: DC | PRN

## 2017-06-24 MED ORDER — IBUPROFEN 200 MG PO TABS
200.0000 mg | ORAL_TABLET | Freq: Four times a day (QID) | ORAL | Status: DC | PRN
  Administered 2017-06-24: 200 mg via ORAL
  Filled 2017-06-24: qty 1

## 2017-06-24 MED ORDER — DICYCLOMINE HCL 20 MG PO TABS: 20.0000 mg | ORAL_TABLET | Freq: Three times a day (TID) | ORAL | 0 refills | Status: DC

## 2017-06-24 MED ORDER — FAMOTIDINE 20 MG PO TABS
20.0000 mg | ORAL_TABLET | Freq: Two times a day (BID) | ORAL | Status: DC
  Filled 2017-06-24: qty 1

## 2017-06-24 MED ORDER — TRAMADOL HCL 50 MG PO TABS: 50.0000 mg | ORAL_TABLET | Freq: Four times a day (QID) | ORAL | Status: DC | PRN

## 2017-06-24 MED ORDER — AMOXICILLIN-POT CLAVULANATE 875-125 MG PO TABS: 1.0000 | ORAL_TABLET | Freq: Two times a day (BID) | ORAL | 0 refills | Status: AC

## 2017-06-24 NOTE — Progress Notes (Signed)
Family Medicine Teaching Service Daily Progress Note Intern Pager: [phone]  Patient name: [name] PatrickRhonda N [name] Medical record number: [id number] Date of birth: 04/04/1975 Age: 42 y.o. Gender: female  Primary Care Provider: Berton BonMikell, Asiyah Zahra, MD Consultants: Surgery Code Status: FULL  Pt Overview and Major Events to Date:  4/18: Admitted to FMTS with upper abdominal pain, likely acute cholecystitis 4/19: lap chole   Assessment and Plan: [name] PatrickRhonda N Cogdell is a 42 y.o. female presenting with epigastric and bilateral upper quadrant abdominal pain x1day. PMH is significant for obesity, T2DM, OSA on CPAP, HLD, iron deficiency anemia, Bipolar disorder, HTN, peptic ulcer disease.  Acute Cholecystitis s/p lap chole  POD #2. Pt has remained afebrile with stable vitals. No leukocytosis. AST slightly elevated although likely inflammatory s/p surgery. Received one dose PRN toradol overnight.  Tolerating diet and advanced to regular.  Endorses mild nausea but no vomiting.  Having flatus.  Plan to discharge home with antibiotics.    - Surgery recs - ok to discharge from surgical standpoint  - CTX (4/18 - 4/21)- transition today to po antibiotics - toradol 30mg  q6hrs prn  - pepcid 20mg  IV bid - zofran PRN  Type 2 Diabetes Last A1c 12/2016 was 10.3. On Metformin, Lantus 60u daily, and 5u Humalog meal coverage when CBG>200. A1c 8.6 this admission. CBG 168 this morning. 7u aspart received in the last 24 hours. - Hold Metformin and Lantus  - moderate SSI  Hypokalemia, resolved  K 3.7 this am. - replete and monitor  Anemia Hb 9.8 this am.  Stable from admission.  Baseline ~11. Asymptomatic. -monitor   Bipolar disorder Stable. On Lamictal and Effexor at home. - continue home meds  Hepatic steatosis Visualized on RUQ US. Likely 2/2 obesity. AST slightly elevated this morning. - Monitor  OSA On CPAP at home. - CPAP qhs  FEN/GI: soft diet, ADAT PPx: Heparin sq  Disposition: DC home  today   Subjective:  Patient feeling better this morning. Endorses mild pain at surgical ports however well controlled.  Tolerating diet, mild nausea but no vomiting.  +flatus.    Objective: Temp:  [98.5 F (36.9 C)-98.7 F (37.1 C)] 98.7 F (37.1 C) (04/21 0515) Pulse Rate:  [76-82] 76 (04/21 0515) BP: (120-130)/(87-96) 120/87 (04/21 0515) SpO2:  [96 %-98 %] 96 % (04/21 0515)   Physical Exam: General: obese female sitting on side of bed, in NAD Cardiovascular: RRR, no MRG  Respiratory: CTAB, normal effort  Abdomen: soft, nontender to palpation, surgical sites clean, dry, no erythema or warmth appreciated, no rebound or guarding, +bs  Extremities: Warm and well-perfused, no LE edema  Laboratory: Recent Labs  Lab 06/22/17 0626 06/23/17 0727 06/24/17 0659  WBC 8.5 9.7 9.1  HGB 11.3* 10.4* 9.8*  HCT 34.1* 32.4* 30.4*  PLT 186 191 189   Recent Labs  Lab 06/22/17 0626 06/23/17 0727 06/24/17 0659  NA 137 138 141  K 3.3* 3.3* 3.7  CL 103 106 107  CO2 24 23 24   BUN <5* 9 10  CREATININE 0.65 0.73 0.74  CALCIUM 8.5* 8.2* 8.5*  PROT 6.5 6.5 6.3*  BILITOT 0.7 0.4 0.4  ALKPHOS 67 66 59  ALT 12* 28 28  AST 14* 46* 31  GLUCOSE 186* 210* 167*    Imaging/Diagnostic Tests: -CT abdomen/pelvis: cholelithiasis, tiny umbilical hernia containing fat, no acute intra-abdominal abnormalities -RUQ US: gallstones with pericholecystic fluid and gallbladder wall thickening felt to be indicative of acute cholecystitis; hepatic steatosis -CXR: normal  Freddrick MarchAmin, Bethsaida Siegenthaler, MD 06/24/2017,  9:08 AM PGY-2, [place] [facility] FPTS Intern pager: [phone], text pages welcome

## 2017-06-24 NOTE — Progress Notes (Signed)
Progress Note: General Surgery Service   Assessment/Plan: Patient Active Problem List   Diagnosis Date Noted  . Cholecystitis 06/21/2017  . Calculus of gallbladder without cholecystitis without obstruction   . RUQ pain   . Constipation 02/02/2017  . Upper abdominal pain 01/12/2017  . Vaginal discharge 01/12/2017  . OSA on CPAP 02/09/2016  . Super obese 02/09/2016  . Diabetes mellitus with hyperglycemia (HCC) 11/25/2015  . Type 2 diabetes mellitus without complication, with long-term current use of insulin (HCC)   . Abdominal pain, left lower quadrant 07/13/2014  . Lung nodule seen on imaging study 09/24/2013  . 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 Meralgia paresthetica of left side 08/18/2010  . Vitamin D deficiency 09/20/2007  . MENOPAUSE, SURGICAL 04/24/2007  . Diaphragmatic hernia 11/20/2006  . Hyperlipidemia 05/03/2006  . OBESITY, NOS 05/03/2006  . ANEMIA, IRON DEFICIENCY, UNSPEC. 05/03/2006  . Bipolar I disorder (HCC) 05/03/2006  . ANXIETY 05/03/2006  . MIGRAINE, UNSPEC., W/O INTRACTABLE MIGRAINE 05/03/2006  . HYPERTENSION, BENIGN SYSTEMIC 05/03/2006  . PEPTIC ULCER DIS., UNSPEC. W/O OBSTRUCTION 05/03/2006   s/p Procedure(s): LAPAROSCOPIC CHOLECYSTECTOMY 06/22/2017, pain issues, unable to take morphine and derivatives, toradol making her "sleepy" -add tramadol for oral pain medication -remove toradol and given low dose ibuproften   LOS: 3 days  Chief Complaint/Subjective: toradol is making her sleepy  Objective: Vital signs in last 24 hours: Temp:  [98.5 F (36.9 C)-98.7 F (37.1 C)] 98.7 F (37.1 C) (04/21 0515) Pulse Rate:  [76-82] 76 (04/21 0515) BP: (120-130)/(87-96) 120/87 (04/21 0515) SpO2:  [96 %-98 %] 96 % (04/21 0515) Last BM Date: 06/20/17  Intake/Output from previous day: 04/20 0701 - 04/21 0700 In: 735.5 [I.V.:385.5; IV Piggyback:350] Out: -  Intake/Output this shift: No intake/output data recorded.  Lungs: CTAB  Cardiovascular: RRR  Abd:  soft, ATTP, incisions c/d/i  Extremities: no edema  Neuro: AOx4  Lab Results: CBC  Recent Labs    06/23/17 0727 06/24/17 0659  WBC 9.7 9.1  HGB 10.4* 9.8*  HCT 32.4* 30.4*  PLT 191 189   BMET Recent Labs    06/23/17 0727 06/24/17 0659  NA 138 141  K 3.3* 3.7  CL 106 107  CO2 23 24  GLUCOSE 210* 167*  BUN 9 10  CREATININE 0.73 0.74  CALCIUM 8.2* 8.5*   PT/INR No results for input(s): LABPROT, INR in the last 72 hours. ABG No results for input(s): PHART, HCO3 in the last 72 hours.  Invalid input(s): PCO2, PO2  Studies/Results:  Anti-infectives: Anti-infectives (From admission, onward)   Start     Dose/Rate Route Frequency Ordered Stop   06/21/17 2030  cefTRIAXone (ROCEPHIN) 1 g in sodium chloride 0.9 % 100 mL IVPB     1 g 200 mL/hr over 30 Minutes Intravenous Every 24 hours 06/21/17 2010        Medications: Scheduled Meds: . famotidine  20 mg Oral BID  . heparin  5,000 Units Subcutaneous Q8H  . insulin aspart  0-15 Units Subcutaneous TID WC  . lamoTRIgine  200 mg Oral Daily  . sodium chloride flush  3 mL Intravenous Q12H  . venlafaxine XR  150 mg Oral Q breakfast   Continuous Infusions: . sodium chloride    . cefTRIAXone (ROCEPHIN)  IV Stopped (06/24/17 0130)  . lactated ringers 10 mL/hr at 06/22/17 1011   PRN Meds:.sodium chloride, acetaminophen **OR** acetaminophen, fentaNYL (SUBLIMAZE) injection, ketorolac, ondansetron **OR** ondansetron (ZOFRAN) IV, sodium chloride flush  Rodman PickleLuke Aaron Brylon Brenning, MD Pg# [phone] [facility]

## 2017-06-24 NOTE — Progress Notes (Signed)
Patient discharged home with mom, all questions answered. Taken down by wheelchair to d/c lobby.

## 2017-06-25 NOTE — Discharge Summary (Signed)
Family Medicine [facility] Discharge Summary  Patient name: [name] PatrickRhonda N [name] Medical record number: [id number] Date of birth: 05/19/1975 Age: 42 y.o. Gender: female Date of Admission: 06/21/2017  Date of Discharge: 06/24/2017 Admitting Physician: Moses MannersWilliam A Hensel, MD  Primary Care Provider: Berton BonMikell, Asiyah Zahra, MD Consultants: General Surgery  Indication for Hospitalization: Acute Cholecystitis  Discharge Diagnoses/Problem List:  Acute Cholecystitis s/p lap chole Type 2 Diabetes, stable Bipolar disorder, stable Hepatic steatosis OSA, stable  Disposition: Home  Discharge Condition: Improved  Discharge Exam:  General: obese female sitting on side of bed, in NAD Cardiovascular: RRR, no MRG  Respiratory: CTAB, normal effort  Abdomen: soft, nontender to palpation, surgical sites clean, dry, no erythema or warmth appreciated, no rebound or guarding, +bs  Extremities: Warm and well-perfused, no LE edema Exam performed by Dr. Nelson ChimesAmin the day of discharge.  Brief Hospital Course:  [name] BilisRhonda N Crenshawis a 42 y.o.femalewith PMH significant forobesity, T2DM, OSA on CPAP, HLD, iron deficiency anemia, Bipolar disorder, HTN, peptic ulcer disease who presented with epigastric and bilateral upper quadrant abdominal pain x1day, found to have cholecystitis evidenced on RUQ US. CT Abdomen/Pelvis obtained in the ED due to normal WBC and afebrile presentation ruled out other causes of abdominal pain. Patient was started on ceftriaxone prior to surgery and underwent laparoscopic cholecystectomy without complications. Patient improved post-op, remained afebrile and tolerated a soft diet prior to discharge. She was transitioned to PO antibiotics with good toleration prior to discharge.  Issues for Follow Up:  1. Medication Changes: 1. Start augmentin (last day 4/25) 2. Toradol PRN for pain 3. Zofran PRN for nausea 2. Patient will follow up with Surgery 2 weeks post-op. 3. Hepatic  steatosis visualized on RUQ US, likely secondary to obesity. AST/ALT were within normal limits. Continue to encourage weight loss.  Significant Procedures: Laparoscopic Cholecystectomy 4/19  Significant Labs and Imaging:  Recent Labs  Lab 06/22/17 0626 06/23/17 0727 06/24/17 0659  WBC 8.5 9.7 9.1  HGB 11.3* 10.4* 9.8*  HCT 34.1* 32.4* 30.4*  PLT 186 191 189   Recent Labs  Lab 06/21/17 0812 06/22/17 0626 06/23/17 0727 06/24/17 0659  NA 141 137 138 141  K 4.0 3.3* 3.3* 3.7  CL 106 103 106 107  CO2 24 24 23 24   GLUCOSE 204* 186* 210* 167*  BUN 7 <5* 9 10  CREATININE 0.68 0.65 0.73 0.74  CALCIUM 9.6 8.5* 8.2* 8.5*  ALKPHOS 85 67 66 59  AST 17 14* 46* 31  ALT 17 12* 28 28  ALBUMIN 3.6 3.1* 2.8* 2.7*   Dg Chest 2 View  Result Date: 06/21/2017 CLINICAL DATA:  Acute onset of upper abdominal pain, nausea and fever. EXAM: CHEST - 2 VIEW COMPARISON:  Chest radiograph performed 09/18/2013, and CT of the chest performed 09/30/2013 FINDINGS: The lungs are well-aerated and clear. There is no evidence of focal opacification, pleural effusion or pneumothorax. The heart is normal in size; the mediastinal contour is within normal limits. No acute osseous abnormalities are seen. IMPRESSION: No acute cardiopulmonary process seen. Electronically Signed   By: Roanna RaiderJeffery  Chang M.D.   On: 06/21/2017 22:23   Ct Abdomen Pelvis W Contrast  Result Date: 06/21/2017 CLINICAL DATA:  Abdominal distension, nausea, and vomiting, history type II diabetes mellitus, benign systemic hypertension EXAM: CT ABDOMEN AND PELVIS WITH CONTRAST TECHNIQUE: Multidetector CT imaging of the abdomen and pelvis was performed using the standard protocol following bolus administration of intravenous contrast. Sagittal and coronal MPR images reconstructed from axial data set. CONTRAST:  ISOVUE-300 IOPAMIDOL (ISOVUE-300) INJECTION 61% IV. No oral contrast. COMPARISON:  11/12/2006 FINDINGS: Lower chest: Minimal bibasilar  atelectasis Hepatobiliary: Cholelithiasis, with a gas containing gallstone in gallbladder at least 12 mm diameter. Liver unremarkable. No biliary dilatation. Pancreas: Normal appearance Spleen: Normal appearance Adrenals/Urinary Tract: Adrenal glands, kidneys, ureters, and bladder normal appearance Stomach/Bowel: Stomach decompressed. Appendix normal appearance. Large and small bowel loops normal appearance. No bowel dilatation or bowel wall thickening. No evidence of obstruction. Vascular/Lymphatic: Vascular structures unremarkable. No adenopathy. Pelvic phleboliths. Reproductive: Uterus surgically absent. Nonvisualization of ovaries. Other: No free air or free fluid. Tiny umbilical hernia containing fat. Musculoskeletal: Degenerative disc disease changes L4-L5. Degenerative changes of the hip joints bilaterally. IMPRESSION: Cholelithiasis. Tiny umbilical hernia containing fat. No acute intra-abdominal or intrapelvic abnormalities. Electronically Signed   By: Ulyses Southward M.D.   On: 06/21/2017 12:02   US Abdomen Limited Ruq  Result Date: 06/21/2017 CLINICAL DATA:  Right upper quadrant pain EXAM: ULTRASOUND ABDOMEN LIMITED RIGHT UPPER QUADRANT COMPARISON:  CT abdomen and pelvis June 21, 2017 FINDINGS: Gallbladder: Within the gallbladder, there are echogenic foci which move and shadow consistent with cholelithiasis. Largest gallstone measures 2.2 cm in length. There is pericholecystic fluid with wall thickening. No sonographic Murphy sign noted by sonographer. Common bile duct: Diameter: 5 mm. No intrahepatic or extrahepatic biliary duct dilatation. Liver: No focal lesion identified. Liver echogenicity is increased. Portal vein is patent on color Doppler imaging with normal direction of blood flow towards the liver. IMPRESSION: 1. Gallstones with pericholecystic fluid and gallbladder wall thickening. These are findings felt to be indicative of a degree of acute cholecystitis. 2. Increase in liver echogenicity, a  finding felt to be indicative of hepatic steatosis. While no focal liver lesions are evident on this study, it must be cautioned that sensitivity of ultrasound for detection of focal liver lesions is diminished in this circumstance. Electronically Signed   By: Bretta Bang III M.D.   On: 06/21/2017 16:21   Results/Tests Pending at Time of Discharge: None  Discharge Medications:  Allergies as of 06/24/2017      Reactions   Hydromorphone Hcl Anaphylaxis, Hives   Meperidine Hcl Anaphylaxis, Hives   Morphine Anaphylaxis, Hives   Cannot take "anything in this family"   Lisinopril Cough   Sumatriptan Other (See Comments)   Makes headaches worse.    Tape Rash   EKG LEADS BLISTER THE PATIENT'S SKIN AND LEAVE RED, RAISED AREAS!!   Zolmitriptan Other (See Comments)   Headache      Medication List    STOP taking these medications   ondansetron 4 MG disintegrating tablet Commonly known as:  ZOFRAN-ODT     TAKE these medications   acetaminophen 325 MG tablet Commonly known as:  TYLENOL Take 325-650 mg by mouth every 6 (six) hours as needed (for pain or headaches).   amoxicillin-clavulanate 875-125 MG tablet Commonly known as:  AUGMENTIN Take 1 tablet by mouth 2 (two) times daily for 4 days.   dicyclomine 20 MG tablet Commonly known as:  BENTYL Take 1 tablet (20 mg total) by mouth 4 (four) times daily -  before meals and at bedtime for 7 days.   ibuprofen 200 MG tablet Commonly known as:  ADVIL,MOTRIN Take 200-400 mg by mouth every 6 (six) hours as needed (for pain or headaches).   Insulin Glargine 100 UNIT/ML Solostar Pen Commonly known as:  LANTUS SOLOSTAR Inject 60 Units into the skin daily. What changed:  when to take this   insulin lispro  100 UNIT/ML KiwkPen Commonly known as:  HUMALOG Take 5 units If blood sugar is above 200 before meal What changed:    how much to take  how to take this  when to take this  additional instructions   ketorolac 10 MG  tablet Commonly known as:  TORADOL Take 1 tablet (10 mg total) by mouth every 6 (six) hours as needed.   lamoTRIgine 200 MG tablet Commonly known as:  LAMICTAL Take 1 tablet (200 mg total) by mouth daily.   metFORMIN 1000 MG (MOD) 24 hr tablet Commonly known as:  GLUMETZA Take 1 tablet (1,000 mg total) by mouth at bedtime.   ondansetron 4 MG tablet Commonly known as:  ZOFRAN Take 1 tablet (4 mg total) by mouth every 6 (six) hours as needed for nausea.   venlafaxine XR 150 MG 24 hr capsule Commonly known as:  EFFEXOR-XR Take 1 capsule (150 mg total) by mouth daily with breakfast. What changed:  when to take this       Discharge Instructions: Please refer to Patient Instructions section of EMR for full details.  Patient was counseled important signs and symptoms that should prompt return to medical care, changes in medications, dietary instructions, activity restrictions, and follow up appointments.   Follow-Up Appointments: Follow-up Information    [facility] Follow up.   Specialty:  General Surgery Why:  call Monday to make an appointment in the [facility] clinic in 2 weeks Contact information: [address] [place] [address] [phone]          Ellwood Dense, DO 06/25/2017, 3:51 PM PGY-1, [facility]

## 2017-06-26 ENCOUNTER — Telehealth: Payer: Self-pay | Admitting: *Deleted

## 2017-06-26 NOTE — Telephone Encounter (Signed)
Message left on clinic nurse voice mail - discharged from hospital on Sunday for gallbladder surgery.  C/o legs and feet swollen today.  Returned call to patient and she denies any leg pain, redness, or shortness of breath.  C/o constipation and feeling full.  Hasn't really eaten today due to discomfort.  Had small BM today and was hard.  Has taken some colace.  Patient will elevate feet/legs for rest of day and call back tomorrow if swelling not better or worsens.  Patient verbalized understanding.   Altamese Dilling~Jeannette Richardson, BSN, RN-BC

## 2017-07-05 ENCOUNTER — Telehealth: Payer: Self-pay

## 2017-07-05 NOTE — Telephone Encounter (Signed)
[name] [name] at [facility] left message on nurse line that patient had a lap chole on 4/21 and was in to see the PA today. Still having issues with pain and PA wanted her to have labs, CBC, CMP and Lipase. Patient would not have labs drawn due to having no insurance.   [name] [name] wants to know if patient can come here for labs either soon or at next visit.  Call back is [phone].  Ples Specter, RN [facility] RN)

## 2017-07-06 NOTE — Telephone Encounter (Signed)
Patient will need to pay for labs either recommend she get labs with her surgeon as they are following her for this issues. Please let Magda Paganini know. Thanks Layn Kye

## 2017-07-09 ENCOUNTER — Other Ambulatory Visit: Payer: Self-pay

## 2017-07-09 MED ORDER — LAMOTRIGINE 200 MG PO TABS: 200.0000 mg | ORAL_TABLET | Freq: Every day | ORAL | 0 refills | Status: DC

## 2017-07-17 ENCOUNTER — Other Ambulatory Visit: Payer: Self-pay | Admitting: Internal Medicine

## 2017-07-18 ENCOUNTER — Encounter: Payer: Self-pay | Admitting: Internal Medicine

## 2017-07-18 ENCOUNTER — Other Ambulatory Visit: Payer: Self-pay

## 2017-07-18 ENCOUNTER — Ambulatory Visit (INDEPENDENT_AMBULATORY_CARE_PROVIDER_SITE_OTHER): Payer: Self-pay | Admitting: Internal Medicine

## 2017-07-18 VITALS — BP 110/72 | HR 84 | Temp 98.4°F | Ht 68.0 in | Wt 323.0 lb

## 2017-07-18 DIAGNOSIS — E559 Vitamin D deficiency, unspecified: Secondary | ICD-10-CM

## 2017-07-18 DIAGNOSIS — E1165 Type 2 diabetes mellitus with hyperglycemia: Secondary | ICD-10-CM

## 2017-07-18 DIAGNOSIS — Z794 Long term (current) use of insulin: Secondary | ICD-10-CM

## 2017-07-18 DIAGNOSIS — Z9049 Acquired absence of other specified parts of digestive tract: Secondary | ICD-10-CM

## 2017-07-18 MED ORDER — LAMOTRIGINE 200 MG PO TABS: 200.0000 mg | ORAL_TABLET | Freq: Every day | ORAL | 3 refills | Status: DC

## 2017-07-18 NOTE — Assessment & Plan Note (Signed)
A1c improved on new regimen will continue Lantus 60 units, NovoLog 5 to 12 units as needed with meals Would like to start patient on Jardiance or Trulicity however -given these are associated with some nausea possible abdominal pain wait until postsurgical pain in order to complicate the situation

## 2017-07-18 NOTE — Assessment & Plan Note (Addendum)
Recheck her vitamin D

## 2017-07-18 NOTE — Progress Notes (Signed)
   [facility] [name], MD Phone: [phone]  Reason For Visit:   # CHRONIC DM, Type 2: CBGs: 130-190 Checks CBGs lowest 99 Meds: Lantus 60 units,  Humalog 5-12 u when needed if CBG is above 200 before meals Reports good compliance. Tolerating well w/o side-effects Lifestyle: Patient is happy with her weight gain.  She is planning to try to lose weight. Any hypoglycemia episodes: Denies palpations, diaphoresis, fatigue, weakness, jittery  # Post Cholestecotomy  Patient still having pain from the site of her scars.  She states that epigastric pain which is still present.  She states that pain improves when she is something tight on her abdomen.  States that it is intermittent.  She has been following with surgery and they wanted to obtain some work as she was unable to pay for it through their clinic.  Patient denies any nausea or vomiting.  She states that her original pain she was having when I seen her in the past has resolved since having her gallbladder taken out.   Past Medical History Reviewed problem list.  Medications- reviewed and updated No additions to family history Social history- patient is a non- smoker  Objective: BP 110/72   Pulse 84   Temp 98.4 F (36.9 C) (Oral)   Ht  (1.727 m)   Wt (!) 323 lb (146.5 kg)   SpO2 97%   BMI 49.11 kg/m  Gen: NAD, alert, cooperative with exam Cardio: regular rate and rhythm, S1S2 heard, no murmurs appreciated Pulm: clear to auscultation bilaterally, no wheezes, rhonchi or rales GI: 3 small incisional scar noted on abdomen, no signs of infection, no discharge, some slight tenderness on palpation the midepigastric scar. Skin: dry, intact, no rashes or lesions  Assessment/Plan: See problem based a/p  Status post cholecystectomy Per [facility], will place orders per CBC, CMET, and lipase-patient to follow-up with them and discussed the orders.  Given patient symptoms likely  neuropathic pain, unlikely infectious or pancreatitis patient improved with abdominal binder like clothing   Vitamin D deficiency Recheck her vitamin D  Diabetes mellitus with hyperglycemia (HCC) A1c improved on new regimen will continue Lantus 60 units, NovoLog 5 to 12 units as needed with meals Would like to start patient on Jardiance or Trulicity however -given these are associated with some nausea possible abdominal pain wait until postsurgical pain in order to complicate the situation

## 2017-07-18 NOTE — Patient Instructions (Addendum)
I am going to obtain the results which you can use at [name]. I want to come back in June and we will consider placing you on a oral medication to get your A1C below 8.0.

## 2017-07-18 NOTE — Assessment & Plan Note (Signed)
Per [facility] [place] [facility], will place orders per CBC, CMET, and lipase-patient to follow-up with them and discussed the orders.  Given patient symptoms likely neuropathic pain, unlikely infectious or pancreatitis patient improved with abdominal binder like clothing

## 2017-07-19 ENCOUNTER — Encounter: Payer: Self-pay | Admitting: Internal Medicine

## 2017-07-19 LAB — CBC
Hematocrit: 33.2 % — ABNORMAL LOW (ref 34.0–46.6)
Hemoglobin: 11.1 g/dL (ref 11.1–15.9)
MCH: 27.8 pg (ref 26.6–33.0)
MCHC: 33.4 g/dL (ref 31.5–35.7)
MCV: 83 fL (ref 79–97)
Platelets: 237 10*3/uL (ref 150–379)
RBC: 3.99 x10E6/uL (ref 3.77–5.28)
RDW: 14.1 % (ref 12.3–15.4)
WBC: 5.6 10*3/uL (ref 3.4–10.8)

## 2017-07-19 LAB — CMP14+EGFR
A/G RATIO: 1.5 (ref 1.2–2.2)
ALK PHOS: 94 IU/L (ref 39–117)
ALT: 6 IU/L (ref 0–32)
AST: 11 IU/L (ref 0–40)
Albumin: 4 g/dL (ref 3.5–5.5)
BUN / CREAT RATIO: 12 (ref 9–23)
BUN: 8 mg/dL (ref 6–24)
Bilirubin Total: <0.2 mg/dL (ref 0.0–1.2)
CO2: 21 mmol/L (ref 20–29)
Calcium: 9.3 mg/dL (ref 8.7–10.2)
Chloride: 105 mmol/L (ref 96–106)
Creatinine, Ser: 0.68 mg/dL (ref 0.57–1.00)
GFR calc Af Amer: 124 mL/min/{1.73_m2} (ref >59)
GFR, EST NON AFRICAN AMERICAN: 107 mL/min/{1.73_m2} (ref >59)
GLOBULIN, TOTAL: 2.6 g/dL (ref 1.5–4.5)
Glucose: 199 mg/dL — ABNORMAL HIGH (ref 65–99)
Potassium: 4 mmol/L (ref 3.5–5.2)
SODIUM: 143 mmol/L (ref 134–144)
Total Protein: 6.6 g/dL (ref 6.0–8.5)

## 2017-07-19 LAB — LIPASE: LIPASE: 30 U/L (ref 14–72)

## 2017-07-19 LAB — VITAMIN D 25 HYDROXY (VIT D DEFICIENCY, FRACTURES): Vit D, 25-Hydroxy: 15 ng/mL — ABNORMAL LOW (ref 30.0–100.0)

## 2017-08-06 ENCOUNTER — Telehealth: Payer: Self-pay

## 2017-08-06 DIAGNOSIS — E559 Vitamin D deficiency, unspecified: Secondary | ICD-10-CM

## 2017-08-06 MED ORDER — VITAMIN D 50 MCG (2000 UT) PO TABS: 2000.0000 [IU] | ORAL_TABLET | Freq: Every day | ORAL | 3 refills | Status: DC

## 2017-08-06 NOTE — Telephone Encounter (Signed)
LMOVM informing pt. [name] [name], CMA  

## 2017-08-06 NOTE — Telephone Encounter (Signed)
Patient left message that she received lab letter. Noticed her Vitamin D is quite low again. Requests a prescription for Vitamin D to be printed [facility]  Call back is [phone].  Ples SpecterAlisa Jasmyne Lodato, RN [facility] RN)

## 2017-08-06 NOTE — Telephone Encounter (Signed)
Sent in prescription for vitamin D

## 2017-08-27 ENCOUNTER — Other Ambulatory Visit: Payer: Self-pay

## 2017-08-27 MED ORDER — ACCU-CHEK AVIVA PLUS W/DEVICE KIT: PACK | 0 refills | Status: DC

## 2017-08-27 NOTE — Telephone Encounter (Signed)
Needs new Accu Chek aviva glucometer and lancet device. Hers is broken. Sent to [facility].  Ples SpecterAlisa Deleon Passe, RN [facility] RN)

## 2017-09-24 ENCOUNTER — Encounter: Payer: Self-pay | Admitting: Student in an Organized Health Care Education/Training Program

## 2017-09-24 ENCOUNTER — Other Ambulatory Visit: Payer: Self-pay

## 2017-09-24 ENCOUNTER — Ambulatory Visit (INDEPENDENT_AMBULATORY_CARE_PROVIDER_SITE_OTHER): Payer: Self-pay | Admitting: Student in an Organized Health Care Education/Training Program

## 2017-09-24 VITALS — BP 124/90 | HR 84 | Temp 98.2°F | Ht 68.0 in | Wt 316.0 lb

## 2017-09-24 DIAGNOSIS — T23121A Burn of first degree of single right finger (nail) except thumb, initial encounter: Secondary | ICD-10-CM

## 2017-09-24 DIAGNOSIS — T300 Burn of unspecified body region, unspecified degree: Secondary | ICD-10-CM

## 2017-09-24 DIAGNOSIS — T23129A Burn of first degree of unspecified single finger (nail) except thumb, initial encounter: Secondary | ICD-10-CM | POA: Insufficient documentation

## 2017-09-24 MED ORDER — SULFAMETHOXAZOLE-TRIMETHOPRIM 400-80 MG PO TABS
1.0000 | ORAL_TABLET | Freq: Two times a day (BID) | ORAL | 0 refills | Status: DC
Start: 2017-09-24 — End: 2017-11-18

## 2017-09-24 NOTE — Patient Instructions (Addendum)
It was a pleasure to see you today!   To summarize our discussion for this visit:  Marking redness on finger and monitor any changes  Return if redness and swelling grows   Prescribing antibiotic to use if notice signs of infection  Some additional health maintenance measures we should update are: . Prescription med insurance  Call the clinic at [phone] if your symptoms worsen or you have any concerns.  Thank you for allowing me to take part in your care,  Dr. Jamelle Rushinghelsey Geraldin Habermehl   Thanks for [facility] for your primary care.   Wound Infection A wound infection happens when germs start to grow in the wound. Germs that cause wound infections are most often bacteria. Other types of infections can occur as well. In some cases, infection can cause the wound to break open. Wound infections need treatment. If a wound infection is not treated, complications can happen. Follow these instructions at home: Medicines  Take or apply over-the-counter and prescription medicines only as told by your doctor.  If you were prescribed antibiotic medicine, take or apply it as told by your doctor. Do not stop using the antibiotic even if your condition improves. Wound care  Clean the wound each day or as told by your doctor. ? Wash the wound with mild soap and water. ? Rinse the wound with water to remove all soap. ? Pat the wound dry with a clean towel. Do not rub it.  Follow instructions from your doctor about how to take care of your wound. Make sure you: ? Wash your hands with soap and water before you change your bandage (dressing). If you cannot use soap and water, use hand sanitizer. ? Change your bandage as told by your doctor. ? Leave stitches (sutures), skin glue, or skin tape (adhesive) strips in place if your wound has been closed. They may need to stay in place for 2 weeks or longer. If tape strips get loose and curl up, you may trim the loose edges. Do not  remove tape strips completely unless your doctor says it is okay. Some wounds are left open to heal on their own.  Check your wound every day for signs of infection. Watch for: ? More redness, swelling, or pain. ? More fluid or blood. ? Warmth. ? Pus or a bad smell. General instructions  Keep the bandage dry until your doctor says it can be removed.  Do not take baths, swim, use a hot tub, or do anything that would put your wound underwater until your doctor says it is okay.  Raise (elevate) the injured area above the level of your heart while you are sitting or lying down.  Do not scratch or pick at the wound.  Keep all follow-up visits as told by your doctor. This is important. Contact a doctor if:  Medicine does not help your pain.  You have more redness, swelling, or pain in the area of your wound.  You have more fluid or blood coming from your wound.  Your wound feels warm to the touch.  You have pus coming from your wound.  You continue to notice a bad smell coming from your wound or your bandage.  Your wound that was closed breaks open. Get help right away if:  You have a red streak going away from your wound.  You have a fever. This information is not intended to replace advice given to you by your health care provider. Make sure you discuss  any questions you have with your health care provider. Document Released: 11/30/2007 Document Revised: 07/29/2015 Document Reviewed: 08/10/2014 Elsevier Interactive Patient Education  2018 [employer].

## 2017-09-24 NOTE — Progress Notes (Signed)
   Subjective:    Patient ID: [name] [name], female    DOB: 03/18/1975, 42 y.o.   MRN: [id number]   CC: [age]-year-old female presents with right third digit swelling  HPI: Patient states last evening she noticed her right middle finger started swelling turning red.  She states that the swelling has worsened since last night. she describes mild pain when using the finger.  She denies any known trauma to the finger.  [place] she has history of MRSA infection to that finger 2 years ago for which they removed her fingernail.  The result of that infection she has decreased sensation and movement of that finger.  She denies fever headache nausea vomiting or other systemic symptoms or concerns.  Patient is not in significant pain at this time. Patient states that she is not currently taking any of her prescribed meds because she is waiting on paperwork for a benefit for reduced prescription costs.  Smoking status reviewed   ROS: pertinent noted in the HPI   Past Medical History:  Diagnosis Date  . Allergy   . Anemia   . Bipolar 1 disorder [facility](HCC)    Sees Dr. Ledon SnareMcKnight, psychology,  [phone]  . Carpal tunnel syndrome   . Depression   . Diabetes mellitus without complication (HCC)   . Family history of adverse reaction to anesthesia    mother & son have difficulty waking  . Fatigue   . Sleep apnea    uses cpap    Past Surgical History:  Procedure Laterality Date  . ABDOMINAL HYSTERECTOMY    . CHOLECYSTECTOMY N/A 06/22/2017   Procedure: LAPAROSCOPIC CHOLECYSTECTOMY;  Surgeon: Kinsinger, De BlanchLuke Aaron, MD;  [facility];  Service: General;  Laterality: N/A;  . I&D EXTREMITY Right 09/14/2015   Procedure: IRRIGATION AND DEBRIDEMENT RIGHT MIDDLE FINGER;  Surgeon: Dominica SeverinWilliam Gramig, MD;  Location: [facility] OR;  Service: Orthopedics;  Laterality: Right;  . TUBAL LIGATION      Past medical history, surgical, family, and social history reviewed and updated in the  EMR as appropriate.  Objective:  BP 124/90   Pulse 84   Temp 98.2 F (36.8 C) (Oral)   Ht 5\' 8"  (1.727 m)   Wt (!) 316 lb (143.3 kg)   SpO2 97%   BMI 48.05 kg/m   Vitals and nursing note reviewed  General: NAD, pleasant, able to participate in exam Respiratory: CTAB, normal effort, No wheezes, rales or rhonchi Extremities: Right third digit has swelling with fluid on anterior surface distal to DIP measuring 3.5 cm in length and covers full width of fingerprint.  Appears to be a blister. swelling appears to be filled with clear fluid no blood or pus is noticed.  Decreased range of motion at DIP.  Slight tenderness to palpation of DIP.  Mild non-well-demarcated redness of distal phalanx.  Transillumination of the finger did not reveal foreign bodies or signs of solid objects in the blister.  Skin: warm and dry, no rashes noted Psych: Normal affect and mood   Assessment & Plan:    Burn erythema of single finger, not thumb Acute Provided education on wound care and monitoring Marked line of erythema with skin marker Prescribed Bactrim to fill if patient notices signs of infection increasing Instructed to return if worsens    Jamelle Rushinghelsey Anderson, DO [facility] PGY-1

## 2017-09-24 NOTE — Assessment & Plan Note (Signed)
Acute Provided education on wound care and monitoring Marked line of erythema with skin marker Prescribed Bactrim to fill if patient notices signs of infection increasing Instructed to return if worsens

## 2017-11-18 ENCOUNTER — Telehealth (HOSPITAL_COMMUNITY): Payer: Self-pay | Admitting: *Deleted

## 2017-11-18 ENCOUNTER — Other Ambulatory Visit: Payer: Self-pay

## 2017-11-18 ENCOUNTER — Ambulatory Visit (INDEPENDENT_AMBULATORY_CARE_PROVIDER_SITE_OTHER): Payer: Self-pay

## 2017-11-18 ENCOUNTER — Encounter (HOSPITAL_COMMUNITY): Payer: Self-pay | Admitting: Emergency Medicine

## 2017-11-18 ENCOUNTER — Ambulatory Visit (HOSPITAL_COMMUNITY)
Admission: EM | Admit: 2017-11-18 | Discharge: 2017-11-18 | Disposition: A | Discharge status: Home / Self Care | Payer: Self-pay | Attending: Internal Medicine | Admitting: Internal Medicine

## 2017-11-18 DIAGNOSIS — S61210A Laceration without foreign body of right index finger without damage to nail, initial encounter: Secondary | ICD-10-CM

## 2017-11-18 MED ORDER — MUPIROCIN 2 % EX OINT: 1.0000 "application " | TOPICAL_OINTMENT | Freq: Two times a day (BID) | CUTANEOUS | 0 refills | Status: DC

## 2017-11-18 MED ORDER — DOXYCYCLINE HYCLATE 100 MG PO CAPS: 100.0000 mg | ORAL_CAPSULE | Freq: Two times a day (BID) | ORAL | 0 refills | Status: DC

## 2017-11-18 NOTE — ED Provider Notes (Signed)
[place]    CSN: [id number] Arrival date & time: 11/18/17  1346     History   Chief Complaint Chief Complaint  Patient presents with  . Laceration  . Hand Pain    HPI [name] [name] is a 42 y.o. female.   42 year old female comes in for evaluation of laceration to the right 2nd finger tip 2 weeks ago and 1 week history of right middle finger pain. States laceration was sustained by a knife while preparing food. She removed part of the skin to "clean area out". Has been dressing area 1-2 times a day with neosporin and bandaid. States her "doctor friend" told her to come for xray and make sure she did not injure her bone. Denies numbness/tingling, spreading erythema, warmth. Denies fever, chills, night sweats.  Has pain to the right 3rd finger. Unknown cause, states "must have injured it somehow", with some swelling and ecchymosis. States had MRSA infection to that finger in the past that left her with decreased sensation to the finger. Able to move the finger.      Past Medical History:  Diagnosis Date  . Allergy   . Anemia   . Bipolar 1 disorder [facility])    Sees Dr. Pablo Ledger, psychology,  [phone]  . Carpal tunnel syndrome   . Depression   . Diabetes mellitus without complication ([place])   . Family history of adverse reaction to anesthesia    mother & son have difficulty waking  . Fatigue   . Lung nodule seen on imaging study 09/24/2013  . Sleep apnea    uses cpap    Patient Active Problem List   Diagnosis Date Noted  . Burn erythema of single finger, not thumb 09/24/2017  . Cholecystitis 06/21/2017  . OSA on CPAP 02/09/2016  . Diabetes mellitus with hyperglycemia ([place]) 11/25/2015  . Type 2 diabetes mellitus without complication, with long-term current use of insulin ([place])   . 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 Vitamin D deficiency 09/20/2007  . MENOPAUSE, SURGICAL 04/24/2007  . Diaphragmatic hernia 11/20/2006  . Hyperlipidemia 05/03/2006  . OBESITY,  NOS 05/03/2006  . ANEMIA, IRON DEFICIENCY, UNSPEC. 05/03/2006  . Bipolar I disorder ([place]) 05/03/2006  . ANXIETY 05/03/2006  . HYPERTENSION, BENIGN SYSTEMIC 05/03/2006  . PEPTIC ULCER DIS., UNSPEC. W/O OBSTRUCTION 05/03/2006    Past Surgical History:  Procedure Laterality Date  . ABDOMINAL HYSTERECTOMY    . CHOLECYSTECTOMY N/A 06/22/2017   Procedure: LAPAROSCOPIC CHOLECYSTECTOMY;  Surgeon: Kinsinger, Arta Bruce, MD;  Location: [place];  Service: General;  Laterality: N/A;  . I&D EXTREMITY Right 09/14/2015   Procedure: IRRIGATION AND DEBRIDEMENT RIGHT MIDDLE FINGER;  Surgeon: Roseanne Kaufman, MD;  Location: [place];  Service: Orthopedics;  Laterality: Right;  . TUBAL LIGATION      OB History   None      Home Medications    Prior to Admission medications   Medication Sig Start Date End Date Taking? Authorizing Provider  Blood Glucose Monitoring Suppl (ACCU-CHEK AVIVA PLUS) w/Device KIT Use to check blood sugar three times daily or as directed. 08/27/17  Yes Leeanne Rio, MD  Cholecalciferol (VITAMIN D) 2000 units tablet Take 1 tablet (2,000 Units total) by mouth daily. 08/06/17  Yes Mikell, Jeani Sow, MD  Insulin Glargine (LANTUS SOLOSTAR) 100 UNIT/ML Solostar Pen Inject 60 Units into the skin daily. Patient taking differently: Inject 60 Units into the skin daily before breakfast.  05/24/17  Yes Mikell, Jeani Sow, MD  insulin lispro (HUMALOG) 100  UNIT/ML KiwkPen Take 5 units If blood sugar is above 200 before meal Patient taking differently: Inject 5-12 Units into the skin See admin instructions. Inject 5-12 units into the skin 3 times a day before meals as needed if BGL is 200 or greater 02/13/17  Yes Mikell, Jeani Sow, MD  lamoTRIgine (LAMICTAL) 200 MG tablet Take 1 tablet (200 mg total) by mouth daily. 07/18/17  Yes Mikell, Jeani Sow, MD  metFORMIN (GLUMETZA) 1000 MG (MOD) 24 hr tablet Take 1 tablet (1,000 mg total) by mouth at bedtime. 08/11/16  Yes Archie Patten, MD    venlafaxine XR (EFFEXOR-XR) 150 MG 24 hr capsule Take 1 capsule (150 mg total) by mouth daily. 07/18/17  Yes Mikell, Jeani Sow, MD  acetaminophen (TYLENOL) 325 MG tablet Take 325-650 mg by mouth every 6 (six) hours as needed (for pain or headaches).    [provider]  doxycycline (VIBRAMYCIN) 100 MG capsule Take 1 capsule (100 mg total) by mouth 2 (two) times daily. 11/18/17   Tasia Catchings, Amy V, PA-C  ibuprofen (ADVIL,MOTRIN) 200 MG tablet Take 200-400 mg by mouth every 6 (six) hours as needed (for pain or headaches).    [provider]  ketorolac (TORADOL) 10 MG tablet Take 1 tablet (10 mg total) by mouth every 6 (six) hours as needed. 06/24/17   Lovenia Kim, MD  mupirocin ointment (BACTROBAN) 2 % Apply 1 application topically 2 (two) times daily. 11/18/17   Tasia Catchings, Amy V, PA-C  ondansetron (ZOFRAN) 4 MG tablet Take 1 tablet (4 mg total) by mouth every 6 (six) hours as needed for nausea. 06/24/17   Lovenia Kim, MD    Family History Family History  Problem Relation Age of Onset  . Hypertension Mother   . Diabetes Mellitus II Mother   . Congestive Heart Failure Father   . Hypertension Father   . Diabetes Mellitus II Father     Social History Social History   Tobacco Use  . Smoking status: Never Smoker  . Smokeless tobacco: Never Used  Substance Use Topics  . Alcohol use: Yes    Alcohol/week: 0.0 standard drinks    Comment: occ  . Drug use: No     Allergies   Hydromorphone hcl; Meperidine hcl; Morphine; Lisinopril; Sumatriptan; Tape; and Zolmitriptan   Review of Systems Review of Systems  Reason unable to perform ROS: See HPI as above.     Physical Exam Triage Vital Signs ED Triage Vitals  Enc Vitals Group     BP 11/18/17 1408 129/82     Pulse Rate 11/18/17 1408 86     Resp --      Temp 11/18/17 1408 98.3 F (36.8 C)     Temp Source 11/18/17 1408 Oral     SpO2 11/18/17 1408 100 %     Weight --      Height --      Head Circumference --      Peak Flow  --      Pain Score 11/18/17 1409 5     Pain Loc --      Pain Edu? --      Excl. in [place]? --    No data found.  Updated Vital Signs BP 129/82 (BP Location: Left Wrist)   Pulse 86   Temp 98.3 F (36.8 C) (Oral)   SpO2 100%   Physical Exam  Constitutional: She is oriented to person, place, and time. She appears well-developed and well-nourished. No distress.  HENT:  Head: Normocephalic  and atraumatic.  Eyes: Pupils are equal, round, and reactive to light. Conjunctivae are normal.  Musculoskeletal:  0.5 crescent shaped laceration to the right 2nd finger tip. No bleeding. Surrounding swelling. Full ROM, sensation intact, cap refill <2s.  No obvious swelling, erythema, warmth, contusion seen to the right 3rd finger. Nail deformity from prior procedure. No tenderness to palpation. Full ROM, sensation decreased but at baseline, cap refill on finger pad <2s  Neurological: She is alert and oriented to person, place, and time.  Skin: She is not diaphoretic.     UC Treatments / Results  Labs (all labs ordered are listed, but only abnormal results are displayed) Labs Reviewed - No data to display  Lab Results  Component Value Date   HGBA1C 8.6 (H) 06/22/2017     EKG None  Radiology Dg Hand Complete Right  Result Date: 11/18/2017 CLINICAL DATA:  Laceration involving the index finger 2 weeks ago. Pain and swelling of the long finger. EXAM: RIGHT HAND - COMPLETE 3+ VIEW COMPARISON:  None. FINDINGS: The joint spaces are maintained. Moderate age advanced osteoarthritic type degenerative changes involving the DIP joint of the long finger. No fracture, foreign body or plain film findings for septic arthritis or osteomyelitis. IMPRESSION: No acute bony findings. Electronically Signed   By: Marijo Sanes M.D.   On: 11/18/2017 15:25    Procedures Procedures (including critical care time)  Medications Ordered in UC Medications - No data to display  Initial Impression / Assessment and  Plan / UC Course  I have reviewed the triage vital signs and the nursing notes.  Pertinent labs & imaging results that were available during my care of the patient were reviewed by me and considered in my medical decision making (see chart for details).    X-ray negative for fracture, dislocation, osteomyelitis.  Wound care instructions given.  Given history of MRSA, will have patient start doxycycline as directed.  Return precautions given.  Otherwise follow-up with PCP for further monitoring needed.  Return precautions given.  Patient expresses understanding and agrees to plan.  Final Clinical Impressions(s) / UC Diagnoses   Final diagnoses:  Laceration of right index finger without foreign body without damage to nail, initial encounter    ED Prescriptions    Medication Sig Dispense Auth. Provider   mupirocin ointment (BACTROBAN) 2 % Apply 1 application topically 2 (two) times daily. 22 g Yu, Amy V, PA-C   doxycycline (VIBRAMYCIN) 100 MG capsule Take 1 capsule (100 mg total) by mouth 2 (two) times daily. 20 capsule Tobin Chad, [place] 11/18/17 1622

## 2017-11-18 NOTE — Discharge Instructions (Signed)
Your x-rays negative for fracture or dislocation, bone damage from the laceration.  Keep area clean and dry.  He can dress wound with Bactroban ointment.  Start doxycycline as directed to prevent infection.  As discussed, the more controlled your blood sugar is, the faster your body will heal.  Please follow-up with your primary care to monitor wound healing.  If experiencing cold fingertips, numbness, discoloration, go to the emergency department for further evaluation.

## 2017-11-18 NOTE — ED Triage Notes (Signed)
Pt has a laceration to her right 2nd finger that occurred two weeks ago.  Pt states there is flesh hanging out of it and it won't heal.  She reports redness, swelling and pain.  Pt had a band aid on it and did not want to take it off until the provider could see it.  She also reports some type of injury to her middle right finger one week ago.  She states she normally can't feel anything in that finger but she has some bruising and pain to the finger.

## 2018-02-13 ENCOUNTER — Other Ambulatory Visit: Payer: Self-pay | Admitting: Internal Medicine

## 2018-02-19 MED ORDER — LAMOTRIGINE 200 MG PO TABS: 200.0000 mg | ORAL_TABLET | Freq: Every day | ORAL | 3 refills | Status: DC

## 2018-02-19 NOTE — Addendum Note (Signed)
Addended by: Jone BasemanFLEEGER, Meagan Ancona D on: 02/19/2018 09:02 AM   Modules accepted: Orders

## 2018-02-19 NOTE — Telephone Encounter (Signed)
1st Rx failed.  Resent. Faten Frieson, Maryjo RochesterJessica [name], CMA

## 2018-03-04 ENCOUNTER — Other Ambulatory Visit: Payer: Self-pay | Admitting: Internal Medicine

## 2018-03-08 ENCOUNTER — Ambulatory Visit (HOSPITAL_COMMUNITY)
Admission: EM | Admit: 2018-03-08 | Discharge: 2018-03-08 | Disposition: A | Discharge status: Home / Self Care | Payer: Self-pay | Attending: Family Medicine | Admitting: Family Medicine

## 2018-03-08 ENCOUNTER — Encounter (HOSPITAL_COMMUNITY): Payer: Self-pay | Admitting: Emergency Medicine

## 2018-03-08 ENCOUNTER — Other Ambulatory Visit: Payer: Self-pay

## 2018-03-08 DIAGNOSIS — S76219A Strain of adductor muscle, fascia and tendon of unspecified thigh, initial encounter: Secondary | ICD-10-CM | POA: Insufficient documentation

## 2018-03-08 MED ORDER — PREDNISONE 10 MG (21) PO TBPK: ORAL_TABLET | Freq: Every day | ORAL | 0 refills | Status: DC

## 2018-03-08 NOTE — ED Provider Notes (Signed)
[facility]   [id number] 03/08/18 Arrival Time: 1021  ASSESSMENT & PLAN:  1. Groin strain, initial encounter    No indication for imaging today. Do not suspect this is a bone problem. Normal LE neurological exam. Discussed. May eventually need MRI.  Meds ordered this encounter  Medications  . predniSONE (STERAPRED UNI-PAK 21 TAB) 10 MG (21) TBPK tablet    Sig: Take by mouth daily. Take as directed.    Dispense:  21 tablet    Refill:  0   Encouraged mobility as she is able. Plans to discuss possible physical therapy referral with her PCP.  Follow-up Information    Myrene BuddyFletcher, Jacob, MD.   Specialty:  Family Medicine Why:  As needed. Contact information: [address] [place] [address] [phone]            Discharge Instructions     You may use over the counter ibuprofen or acetaminophen as needed in addition to your prescription today.      Reviewed expectations re: course of current medical issues. Questions answered. Outlined signs and symptoms indicating need for more acute intervention. Patient verbalized understanding. After Visit Summary given.  SUBJECTIVE: History from: patient. [name] PatrickRhonda N Goodlow is a 43 y.o. female who reports persistent moderate pain of her right inner thigh from groin to her knee; described as aching and pulling; begins in groin and travels toward her knee. Able to bear weight but with reported pain. History of similar "every few months". Usually takes ibuprofen and pain resolves over three days. Taking ibuprofen now without consistent relief. Onset: gradual, 3 days ago. Injury/trama: no. Symptoms have gradually worsened since beginning. Aggravating factors: movement and weight bearing. Alleviating factors: sitting still. Associated symptoms: none reported. Extremity sensation changes or weakness: none. No change in bowel/bladder habits.  Past Surgical History:  Procedure Laterality Date  . ABDOMINAL  HYSTERECTOMY    . CHOLECYSTECTOMY N/A 06/22/2017   Procedure: LAPAROSCOPIC CHOLECYSTECTOMY;  Surgeon: Kinsinger, De BlanchLuke Aaron, MD;  [facility];  Service: General;  Laterality: N/A;  . I&D EXTREMITY Right 09/14/2015   Procedure: IRRIGATION AND DEBRIDEMENT RIGHT MIDDLE FINGER;  Surgeon: Dominica SeverinWilliam Gramig, MD;  [facility];  Service: Orthopedics;  Laterality: Right;  . TUBAL LIGATION      No LMP recorded. Patient has had a hysterectomy.  ROS: As per HPI. All other systems negative.    OBJECTIVE:  Vitals:   03/08/18 1204  BP: 134/77  Pulse: 88  Resp: (!) 30  Temp: 98.1 F (36.7 C)  TempSrc: Oral  SpO2: 100%    Recheck RR: 18 and unlabored  General appearance: alert; no distress Resp: unlabored respirations Abd: morbidly obese; non-tender; soft Extremities: . LLE and RLE: warm and well perfused; poorly localized moderate tenderness over right inner thigh from groin to just above her knee; no specific knee pain with palpation; no knee effusion or swelling; without gross deformities; with no swelling; with no bruising; ROM: of RLE limited by reported discomfort of inner thigh; no soft tissue masses appreciated CV: brisk extremity capillary refill of RLE Skin: warm and dry; no visible rashes Neurologic: normal reflexes of RLE and LLE; normal sensation of RLE and LLE; normal strength of RLE and LLE Psychological: alert and cooperative; normal mood and affect  Allergies  Allergen Reactions  . Hydromorphone Hcl Anaphylaxis and Hives  . Meperidine Hcl Anaphylaxis and Hives  . Morphine Anaphylaxis and Hives    Cannot take "anything in this family"   . Lisinopril Cough  .  Sumatriptan Other (See Comments)    Makes headaches worse.   . Tape Rash    EKG LEADS BLISTER THE PATIENT'S SKIN AND LEAVE RED, RAISED AREAS!!  . Zolmitriptan Other (See Comments)    Headache    Past Medical History:  Diagnosis Date  . Allergy   . Anemia   . Bipolar 1 disorder [facility])    Sees Dr. Ledon Snare,  psychology,  [phone]  . Carpal tunnel syndrome   . Depression   . Diabetes mellitus without complication ([facility])   . Family history of adverse reaction to anesthesia    mother & son have difficulty waking  . Fatigue   . Lung nodule seen on imaging study 09/24/2013  . Sleep apnea    uses cpap   Social History   Socioeconomic History  . Marital status: Single    Spouse name: Not on file  . Number of children: 3  . Years of education: Not on file  . Highest education level: Not on file  Occupational History  . Not on file  Social Needs  . Financial resource strain: Not on file  . Food insecurity:    Worry: Not on file    Inability: Not on file  . Transportation needs:    Medical: Not on file    Non-medical: Not on file  Tobacco Use  . Smoking status: Never Smoker  . Smokeless tobacco: Never Used  Substance and Sexual Activity  . Alcohol use: Yes    Alcohol/week: 0.0 standard drinks    Comment: occ  . Drug use: No  . Sexual activity: Not on file  Lifestyle  . Physical activity:    Days per week: Not on file    Minutes per session: Not on file  . Stress: Not on file  Relationships  . Social connections:    Talks on phone: Not on file    Gets together: Not on file    Attends religious service: Not on file    Active member of club or organization: Not on file    Attends meetings of clubs or organizations: Not on file    Relationship status: Not on file  Other Topics Concern  . Not on file  Social History Narrative  . Not on file   Family History  Problem Relation Age of Onset  . Hypertension Mother   . Diabetes Mellitus II Mother   . Congestive Heart Failure Father   . Hypertension Father   . Diabetes Mellitus II Father    Past Surgical History:  Procedure Laterality Date  . ABDOMINAL HYSTERECTOMY    . CHOLECYSTECTOMY N/A 06/22/2017   Procedure: LAPAROSCOPIC CHOLECYSTECTOMY;  Surgeon: Kinsinger, De Blanch, MD;  [facility];  Service: General;   Laterality: N/A;  . I&D EXTREMITY Right 09/14/2015   Procedure: IRRIGATION AND DEBRIDEMENT RIGHT MIDDLE FINGER;  Surgeon: Dominica Severin, MD;  [facility];  Service: Orthopedics;  Laterality: Right;  . TUBAL LIGATION        Mardella Layman, MD 03/08/18 [phone]

## 2018-03-08 NOTE — Discharge Instructions (Addendum)
You may use over the counter ibuprofen or acetaminophen as needed in addition to your prescription today.

## 2018-03-08 NOTE — ED Triage Notes (Signed)
Pt reports a pain that goes from her right groin to her right medial knee.  Pt has had this for several years.  Her current episode started on Tuesday.

## 2018-03-11 ENCOUNTER — Other Ambulatory Visit: Payer: Self-pay | Admitting: Internal Medicine

## 2018-03-11 NOTE — Telephone Encounter (Signed)
Rx is not going thru as electronic.  Attempted twice.  Called in verbally.   Per [facility] Department, pt reports that she takes more than 5 units "depending on how many carbs she is eating that day"  Pt has appt on 03/22/18 so will forward to MD as FYI. Jeshurun Oaxaca, Maryjo Rochester, CMA

## 2018-03-22 ENCOUNTER — Ambulatory Visit (INDEPENDENT_AMBULATORY_CARE_PROVIDER_SITE_OTHER): Payer: Self-pay | Admitting: Family Medicine

## 2018-03-22 ENCOUNTER — Other Ambulatory Visit: Payer: Self-pay

## 2018-03-22 ENCOUNTER — Other Ambulatory Visit (HOSPITAL_COMMUNITY)
Admission: RE | Admit: 2018-03-22 | Discharge: 2018-03-22 | Disposition: A | Discharge status: Home / Self Care | Payer: Self-pay | Source: Ambulatory Visit | Attending: Family Medicine | Admitting: Family Medicine

## 2018-03-22 ENCOUNTER — Encounter: Payer: Self-pay | Admitting: Family Medicine

## 2018-03-22 VITALS — BP 128/80 | HR 90 | Temp 98.3°F | Ht 68.0 in | Wt 318.0 lb

## 2018-03-22 DIAGNOSIS — Z202 Contact with and (suspected) exposure to infections with a predominantly sexual mode of transmission: Secondary | ICD-10-CM | POA: Insufficient documentation

## 2018-03-22 DIAGNOSIS — E11628 Type 2 diabetes mellitus with other skin complications: Secondary | ICD-10-CM

## 2018-03-22 DIAGNOSIS — E559 Vitamin D deficiency, unspecified: Secondary | ICD-10-CM

## 2018-03-22 DIAGNOSIS — R609 Edema, unspecified: Secondary | ICD-10-CM

## 2018-03-22 DIAGNOSIS — E1165 Type 2 diabetes mellitus with hyperglycemia: Secondary | ICD-10-CM

## 2018-03-22 DIAGNOSIS — R1031 Right lower quadrant pain: Secondary | ICD-10-CM

## 2018-03-22 DIAGNOSIS — Z794 Long term (current) use of insulin: Secondary | ICD-10-CM

## 2018-03-22 LAB — POCT GLYCOSYLATED HEMOGLOBIN (HGB A1C): HBA1C, POC (CONTROLLED DIABETIC RANGE): 9.1 % — AB (ref 0.0–7.0)

## 2018-03-22 MED ORDER — VENLAFAXINE HCL ER 150 MG PO CP24: 150.0000 mg | ORAL_CAPSULE | Freq: Every day | ORAL | 6 refills | Status: DC

## 2018-03-22 MED ORDER — GABAPENTIN 300 MG PO CAPS: 300.0000 mg | ORAL_CAPSULE | Freq: Three times a day (TID) | ORAL | 3 refills | Status: DC

## 2018-03-22 MED ORDER — ONDANSETRON HCL 4 MG PO TABS: 4.0000 mg | ORAL_TABLET | Freq: Four times a day (QID) | ORAL | 0 refills | Status: DC | PRN

## 2018-03-22 MED ORDER — MELOXICAM 15 MG PO TABS: 15.0000 mg | ORAL_TABLET | Freq: Every day | ORAL | 0 refills | Status: DC

## 2018-03-22 MED ORDER — METFORMIN HCL ER (MOD) 1000 MG PO TB24: 1000.0000 mg | ORAL_TABLET | Freq: Every day | ORAL | 0 refills | Status: DC

## 2018-03-22 MED ORDER — ACCU-CHEK AVIVA PLUS W/DEVICE KIT: PACK | 0 refills | Status: DC

## 2018-03-22 MED ORDER — LAMOTRIGINE 100 MG PO TABS: ORAL_TABLET | ORAL | 0 refills | Status: DC

## 2018-03-22 MED ORDER — VITAMIN D 50 MCG (2000 UT) PO TABS: 2000.0000 [IU] | ORAL_TABLET | Freq: Every day | ORAL | 3 refills | Status: DC

## 2018-03-22 MED ORDER — INSULIN GLARGINE 100 UNIT/ML SOLOSTAR PEN: 60.0000 [IU] | PEN_INJECTOR | Freq: Every day | SUBCUTANEOUS | 0 refills | Status: DC

## 2018-03-22 MED ORDER — INSULIN LISPRO (1 UNIT DIAL) 100 UNIT/ML (KWIKPEN): PEN_INJECTOR | SUBCUTANEOUS | 0 refills | Status: DC

## 2018-03-22 NOTE — Patient Instructions (Signed)
It was great meeting you today! I am sorry you have been having so much trouble with your groin. We will try gabapentin and mobic to help. I also gave you some groin stretching exercises,  Regarding your leg swelling this can best be managed with compression stockings and elevation.  For your diabetes, I would recommend following up with Dr. Raymondo Band as your a1c has increased.

## 2018-03-25 ENCOUNTER — Encounter: Payer: Self-pay | Admitting: Family Medicine

## 2018-03-25 DIAGNOSIS — R609 Edema, unspecified: Secondary | ICD-10-CM | POA: Insufficient documentation

## 2018-03-25 DIAGNOSIS — Z202 Contact with and (suspected) exposure to infections with a predominantly sexual mode of transmission: Secondary | ICD-10-CM | POA: Insufficient documentation

## 2018-03-25 LAB — URINE CYTOLOGY ANCILLARY ONLY
CHLAMYDIA, DNA PROBE: NEGATIVE
NEISSERIA GONORRHEA: NEGATIVE

## 2018-03-25 NOTE — Assessment & Plan Note (Signed)
Likely pitting edema.  Reviewed most recent lab work and does not have any protein deficiency, kidney problems, liver problems to account for this.  Can try compression stockings and elevation of feet.  If these do not work can investigate further with likely echocardiogram.

## 2018-03-25 NOTE — Assessment & Plan Note (Signed)
Patient taking Lantus 60 units and Humalog 5 units with meals.  A1c increased from 8.6-9.1 despite all this insulin.  Came with some readings from her glucose monitor.  Explained to patient that I cannot safely alter her insulin dose unless she records these in a more complete manner.  Patient to follow-up with Dr. Raymondo Band in pharmacy clinic for further management.

## 2018-03-25 NOTE — Progress Notes (Signed)
HPI 43 year old female who presents for medication refills, leg swelling, groin pain, diabetes, STD check.  Patient states that she needs "every medication refilled".  Medication refills performed.  Explained to patient that she can call her pharmacy and they will since her refill request in the future.  Patient states that for the last couple months she has had increased leg swelling.  The swelling is most prominent when she sits for or stands for long periods of time.  She states that she does not really notice it being in the morning.  Patient states she has not been having good exercise or been eating very well recently. Exception his last 2 weeks after New Year's she has been eating a lot better.  She states that she has been intermittently checking her sugars and that these have ranged from the 100s to the 300s.  Patient's hemoglobin A1c worsened from 8.6-9.1.  Patient also desiring STD check.  Urine GC/chlamydia performed.  Patient is worried she may have been exposed to GC/chlamydia recently.  Patient also wanted to discuss groin pain.  She is initially seen on 03/08/2018 and was diagnosed with a groin strain.  The pain occurs in her right groin and happens intermittently.  Occasionally is so intense she cannot sit straight or walk.  She was given a steroid Dosepak at urgent care at that time.  It has not helped much.  CC: Medication refill, leg swelling, groin pain, diabetes   ROS:   Review of Systems See HPI for ROS.   CC, SH/smoking status, and VS noted  Objective: BP 128/80   Pulse 90   Temp 98.3 F (36.8 C) (Oral)   Ht _0  (1.727 m)   Wt (!) 318 lb (144.2 kg)   SpO2 96%   BMI 48.35 kg/m  Gen: 43 year old, obese African-American female, resting comfortably in bed, no acute distress CV: RRR, no murmur.  Trace to 1+ pitting edema bilateral lower extremities Resp: CTAB, no wheezes, non-labored Abd: SNTND, BS present, no guarding or organomegaly Ext: No edema,  warm Neuro: Alert and oriented, Speech clear, No gross deficits   Assessment and plan:  Diabetes mellitus ([place]) Patient taking Lantus 60 units and Humalog 5 units with meals.  A1c increased from 8.6-9.1 despite all this insulin.  Came with some readings from her glucose monitor.  Explained to patient that I cannot safely alter her insulin dose unless she records these in a more complete manner.  Patient to follow-up with Dr. Valentina Lucks in pharmacy clinic for further management.  STD exposure Possible STD exposure.  GC/chlamydia from urine negative.  Edema Likely pitting edema.  Reviewed most recent lab work and does not have any protein deficiency, kidney problems, liver problems to account for this.  Can try compression stockings and elevation of feet.  If these do not work can investigate further with likely echocardiogram.  Right inguinal pain Patient with recurrent right inguinal pain.  Recently seen in urgent care and prescribed steroid Dosepak, without improvement. Per chart review patient has had this problem since at least 2016.  At that time was diagnosed with strain of adductor magnus and given NSAIDs.  Possible etiologies could be right hip, inguinal canal, or musculoskeletal structures in that area.  Gave rehab exercises, meloxicam, gabapentin. -Meloxicam 15 mg daily for 30 days -Gabapentin 300 mg 3 times daily -Rehab exercises for right inguinal pain -Ice and heat alternation -Rest as much possible    Orders Placed This Encounter  Procedures  . POCT  glycosylated hemoglobin (Hb A1C)    Meds ordered this encounter  Medications  . Blood Glucose Monitoring Suppl (ACCU-CHEK AVIVA PLUS) w/Device KIT    Sig: Use to check blood sugar three times daily or as directed.    Dispense:  1 kit    Refill:  0    Dx: E11.65, Z79.4  . Cholecalciferol (VITAMIN D) 50 MCG (2000 UT) tablet    Sig: Take 1 tablet (2,000 Units total) by mouth daily.    Dispense:  30 tablet    Refill:  3  .  insulin lispro (HUMALOG KWIKPEN) 100 UNIT/ML KwikPen    Sig: INJECT 5 UNITS INTO THE SKIN IF BLOOD SUGAR IS ABOVE 200 3 TIMES A DAY BEFORE MEALS.    Dispense:  6 mL    Refill:  0  . Insulin Glargine (LANTUS SOLOSTAR) 100 UNIT/ML Solostar Pen    Sig: Inject 60 Units into the skin daily before breakfast.    Dispense:  10 pen    Refill:  0  . lamoTRIgine (LAMICTAL) 100 MG tablet    Sig: TAKE 2 TABLETS (200MG TOTAL) BY MOUTH DAILY.    Dispense:  60 tablet    Refill:  0    $  . metFORMIN (GLUMETZA) 1000 MG (MOD) 24 hr tablet    Sig: Take 1 tablet (1,000 mg total) by mouth at bedtime.    Dispense:  90 tablet    Refill:  0  . ondansetron (ZOFRAN) 4 MG tablet    Sig: Take 1 tablet (4 mg total) by mouth every 6 (six) hours as needed for nausea.    Dispense:  20 tablet    Refill:  0  . venlafaxine XR (EFFEXOR-XR) 150 MG 24 hr capsule    Sig: Take 1 capsule (150 mg total) by mouth daily.    Dispense:  30 capsule    Refill:  6  . DISCONTD: meloxicam (MOBIC) 15 MG tablet    Sig: Take 1 tablet (15 mg total) by mouth daily.    Dispense:  30 tablet    Refill:  0  . DISCONTD: gabapentin (NEURONTIN) 300 MG capsule    Sig: Take 1 capsule (300 mg total) by mouth 3 (three) times daily.    Dispense:  90 capsule    Refill:  3  . meloxicam (MOBIC) 15 MG tablet    Sig: Take 1 tablet (15 mg total) by mouth daily.    Dispense:  30 tablet    Refill:  0  . gabapentin (NEURONTIN) 300 MG capsule    Sig: Take 1 capsule (300 mg total) by mouth 3 (three) times daily.    Dispense:  90 capsule    Refill:  3     Guadalupe [name] MD PGY-2 Family Medicine Resident  03/25/2018 11:13 PM

## 2018-03-25 NOTE — Assessment & Plan Note (Addendum)
Patient with recurrent right inguinal pain.  Recently seen in urgent care and prescribed steroid Dosepak, without improvement. Per chart review patient has had this problem since at least 2016.  At that time was diagnosed with strain of adductor magnus and given NSAIDs.  Possible etiologies could be right hip, inguinal canal, or musculoskeletal structures in that area.  Gave rehab exercises, meloxicam, gabapentin. -Meloxicam 15 mg daily for 30 days -Gabapentin 300 mg 3 times daily -Rehab exercises for right inguinal pain -Ice and heat alternation -Rest as much possible

## 2018-03-25 NOTE — Assessment & Plan Note (Signed)
Possible STD exposure.  GC/chlamydia from urine negative.

## 2018-03-29 ENCOUNTER — Encounter: Payer: Self-pay | Admitting: Family Medicine

## 2018-05-20 ENCOUNTER — Other Ambulatory Visit: Payer: Self-pay | Admitting: Family Medicine

## 2018-05-20 DIAGNOSIS — Z794 Long term (current) use of insulin: Principal | ICD-10-CM

## 2018-05-20 DIAGNOSIS — E11628 Type 2 diabetes mellitus with other skin complications: Secondary | ICD-10-CM

## 2018-05-22 ENCOUNTER — Other Ambulatory Visit: Payer: Self-pay | Admitting: Family Medicine

## 2018-05-25 MED FILL — VENLAFAXINE HCL ER 150 MG C: 150 | 30 days supply | Qty: 30 | Fill #0 | Status: TO

## 2018-06-03 ENCOUNTER — Other Ambulatory Visit: Payer: Self-pay | Admitting: Family Medicine

## 2018-06-03 DIAGNOSIS — Z794 Long term (current) use of insulin: Principal | ICD-10-CM

## 2018-06-03 DIAGNOSIS — E11628 Type 2 diabetes mellitus with other skin complications: Secondary | ICD-10-CM

## 2018-06-26 ENCOUNTER — Telehealth: Payer: Self-pay | Admitting: Family Medicine

## 2018-06-26 DIAGNOSIS — Z794 Long term (current) use of insulin: Principal | ICD-10-CM

## 2018-06-26 DIAGNOSIS — E11628 Type 2 diabetes mellitus with other skin complications: Secondary | ICD-10-CM

## 2018-06-26 MED ORDER — EMPAGLIFLOZIN 10 MG PO TABS: 10.0000 mg | ORAL_TABLET | Freq: Every day | ORAL | 3 refills | Status: DC

## 2018-06-26 MED ORDER — INSULIN GLARGINE 100 UNIT/ML SOLOSTAR PEN: 35.0000 [IU] | PEN_INJECTOR | Freq: Two times a day (BID) | SUBCUTANEOUS | 11 refills | Status: DC

## 2018-06-26 MED ORDER — INSULIN LISPRO (1 UNIT DIAL) 100 UNIT/ML (KWIKPEN): 30.0000 [IU] | PEN_INJECTOR | Freq: Two times a day (BID) | SUBCUTANEOUS | 11 refills | Status: DC

## 2018-06-26 NOTE — Telephone Encounter (Signed)
Patient reported blood out of control.   Blood sugars reported : 179- 434 with majority ot readings in the 200 and 300s. She reports nocturia of 3-4 X per night.   She states she has been trying to eat healthier however she is currently out of work and admits to being less active.   Current insulin regimen:  Lantus 60 units once daily and Humalog 22-24 units prior to each of two meals daily.   She is willing to adjust her insulin regimen.   Instructed to increase Lantus to 35 units BID And increase mealtime insulin as well.  Provided basic sliding scale for Humalog as follows.   If pre-meal CBG is < 200 continue with same Humalog dose ~ 22 units.  If 201-299 take 25 units of Humalog If 300 or higher take 30 units of Humalog  Patient admits she has been running low on insulin and requested new prescription from [facility] for her insulins.   I agreed to supply samples for this medications from our office (Novolog or [place]) in 1:! Conversion discussed with patient.  She will plan to pick up tomorrow 4/23    She was also willing to go to [facility] and start paperwork to obtain Jardiance. 10mg  once daily.   All three medicaitons - new Rxs sent to [facility] at [facility]

## 2018-06-26 NOTE — Telephone Encounter (Signed)
Pt called wanting to talk to [facility]. Pt wanted to make an appt, but I told her Raymondo Band is calling a lot of his patients. Please call pt back to discuss her concerns.

## 2018-06-26 NOTE — Telephone Encounter (Signed)
Reviewed and agree.

## 2018-06-27 NOTE — Telephone Encounter (Signed)
Samples for FIASP insulin provided to patient (as replacement option for Humalog) Patient was made aware of 1:1 conversion for dosing.   Medication Samples have been provided to the patient.  Drug name: Fiasp (insulin Aspart)       Strength: 100units/ml        Qty: 4 pens  LOT: [id number]  Exp.Date: 01/04/2020  Dosing instructions: 20units prior to meals if CBGs< 200, take 25 units if CBGs 200-299, take 30 units if CBGs 300 or higher.    The patient has been instructed regarding the correct time, dose, and frequency of taking this medication, including desired effects and most common side effects.   [name] [name] 9:06 AM 06/27/2018

## 2018-07-02 MED FILL — VENLAFAXINE HCL ER 150 MG C: 150 | 30 days supply | Qty: 30 | Fill #0

## 2018-07-25 ENCOUNTER — Ambulatory Visit: Payer: Self-pay | Admitting: Family Medicine

## 2018-07-26 ENCOUNTER — Other Ambulatory Visit: Payer: Self-pay

## 2018-07-26 ENCOUNTER — Ambulatory Visit (INDEPENDENT_AMBULATORY_CARE_PROVIDER_SITE_OTHER): Payer: Self-pay | Admitting: Family Medicine

## 2018-07-26 VITALS — BP 130/72 | HR 89 | Ht 68.0 in | Wt 320.1 lb

## 2018-07-26 DIAGNOSIS — M19042 Primary osteoarthritis, left hand: Secondary | ICD-10-CM | POA: Insufficient documentation

## 2018-07-26 DIAGNOSIS — M67432 Ganglion, left wrist: Secondary | ICD-10-CM

## 2018-07-26 HISTORY — DX: Ganglion, left wrist: M67.432

## 2018-07-26 MED ORDER — DICLOFENAC SODIUM 1 % TD GEL: 4.0000 g | Freq: Four times a day (QID) | TRANSDERMAL | 3 refills | Status: DC

## 2018-07-26 NOTE — Assessment & Plan Note (Signed)
  Discussed tylenol, ibuprofen, topical voltaren, icing, rest. Offered injection today w/ US guidance, patient declined, but may consider in future. Follow up as needed

## 2018-07-26 NOTE — Assessment & Plan Note (Signed)
  Cyst is small and firm, doubt aspiration would be successful and given location on tendon sheath would refer to hand surgeon if patient wanted intervention, she declines citing financial reasons/no insurance. Follow up as needed

## 2018-07-26 NOTE — Patient Instructions (Addendum)
Use topical NSAID gel (voltaren or diclofenac) four times a day on the thumb/wrist area that hurts  Take tylenol [phone] mg three times a day  Take ibuprofen as needed on top of that as I know it makes you drowsy  Ice thumb/wrist liberally  Return to see us as needed  If you have questions or concerns please do not hesitate to call at [phone].  [name] PattyAngela Meeka Cartelli, DO PGY-3, [facility] 07/26/2018 11:34 AM   Osteoarthritis  Osteoarthritis is a type of arthritis that affects tissue that covers the ends of bones in joints (cartilage). Cartilage acts as a cushion between the bones and helps them move smoothly. Osteoarthritis results when cartilage in the joints gets worn down. Osteoarthritis is sometimes called "wear and tear" arthritis. Osteoarthritis is the most common form of arthritis. It often occurs in older people. It is a condition that gets worse over time (a progressive condition). Joints that are most often affected by this condition are in:  Fingers.  Toes.  Hips.  Knees.  Spine, including neck and lower back. What are the causes? This condition is caused by age-related wearing down of cartilage that covers the ends of bones. What increases the risk? The following factors may make you more likely to develop this condition:  Older age.  Being overweight or obese.  Overuse of joints, such as in athletes.  Past injury of a joint.  Past surgery on a joint.  Family history of osteoarthritis. What are the signs or symptoms? The main symptoms of this condition are pain, swelling, and stiffness in the joint. The joint may lose its shape over time. Small pieces of bone or cartilage may break off and float inside of the joint, which may cause more pain and damage to the joint. Small deposits of bone (osteophytes) may grow on the edges of the joint. Other symptoms may include:  A grating or scraping feeling inside the joint when you move it.  Popping  or creaking sounds when you move. Symptoms may affect one or more joints. Osteoarthritis in a major joint, such as your knee or hip, can make it painful to walk or exercise. If you have osteoarthritis in your hands, you might not be able to grip items, twist your hand, or control small movements of your hands and fingers (fine motor skills). How is this diagnosed? This condition may be diagnosed based on:  Your medical history.  A physical exam.  Your symptoms.  X-rays of the affected joint(s).  Blood tests to rule out other types of arthritis. How is this treated? There is no cure for this condition, but treatment can help to control pain and improve joint function. Treatment plans may include:  A prescribed exercise program that allows for rest and joint relief. You may work with a physical therapist.  A weight control plan.  Pain relief techniques, such as: ? Applying heat and cold to the joint. ? Electric pulses delivered to nerve endings under the skin (transcutaneous electrical nerve stimulation, or TENS). ? Massage. ? Certain nutritional supplements.  NSAIDs or prescription medicines to help relieve pain.  Medicine to help relieve pain and inflammation (corticosteroids). This can be given by mouth (orally) or as an injection.  Assistive devices, such as a brace, wrap, splint, specialized glove, or cane.  Surgery, such as: ? An osteotomy. This is done to reposition the bones and relieve pain or to remove loose pieces of bone and cartilage. ? Joint replacement surgery. You may  need this surgery if you have very bad (advanced) osteoarthritis. Follow these instructions at home: Activity  Rest your affected joints as directed by your health care provider.  Do not drive or use heavy machinery while taking prescription pain medicine.  Exercise as directed. Your health care provider or physical therapist may recommend specific types of exercise, such as: ? Strengthening  exercises. These are done to strengthen the muscles that support joints that are affected by arthritis. They can be performed with weights or with exercise bands to add resistance. ? Aerobic activities. These are exercises, such as brisk walking or water aerobics, that get your heart pumping. ? Range-of-motion activities. These keep your joints easy to move. ? Balance and agility exercises. Managing pain, stiffness, and swelling      If directed, apply heat to the affected area as often as told by your health care provider. Use the heat source that your health care provider recommends, such as a moist heat pack or a heating pad. ? If you have a removable assistive device, remove it as told by your health care provider. ? Place a towel between your skin and the heat source. If your health care provider tells you to keep the assistive device on while you apply heat, place a towel between the assistive device and the heat source. ? Leave the heat on for 20-30 minutes. ? Remove the heat if your skin turns bright red. This is especially important if you are unable to feel pain, heat, or cold. You may have a greater risk of getting burned.  If directed, put ice on the affected joint: ? If you have a removable assistive device, remove it as told by your health care provider. ? Put ice in a plastic bag. ? Place a towel between your skin and the bag. If your health care provider tells you to keep the assistive device on during icing, place a towel between the assistive device and the bag. ? Leave the ice on for 20 minutes, 2-3 times a day. General instructions  Take over-the-counter and prescription medicines only as told by your health care provider.  Maintain a healthy weight. Follow instructions from your health care provider for weight control. These may include dietary restrictions.  Do not use any products that contain nicotine or tobacco, such as cigarettes and e-cigarettes. These can delay  bone healing. If you need help quitting, ask your health care provider.  Use assistive devices as directed by your health care provider.  Keep all follow-up visits as told by your health care provider. This is important. Where to find more information  [employer] of Arthritis and Musculoskeletal and Skin Diseases: www.niams.http://www.myers.net/  [employer] on Aging: https://walker.com/  American College of Rheumatology: www.rheumatology.org Contact a health care provider if:  Your skin turns red.  You develop a rash.  You have pain that gets worse.  You have a fever along with joint or muscle aches. Get help right away if:  You lose a lot of weight.  You suddenly lose your appetite.  You have night sweats. Summary  Osteoarthritis is a type of arthritis that affects tissue covering the ends of bones in joints (cartilage).  This condition is caused by age-related wearing down of cartilage that covers the ends of bones.  The main symptom of this condition is pain, swelling, and stiffness in the joint.  There is no cure for this condition, but treatment can help to control pain and improve joint function. This information  is not intended to replace advice given to you by your health care provider. Make sure you discuss any questions you have with your health care provider. Document Released: 02/20/2005 Document Revised: 11/27/2016 Document Reviewed: 10/25/2015 Elsevier Interactive Patient Education  2019 [employer].

## 2018-07-26 NOTE — Progress Notes (Signed)
    Subjective:    Patient ID: [name] [name], female    DOB: 11/23/1975, 43 y.o.   MRN: [id number]   CC: left thumb and wrist pain  HPI:   Left wrist- has had a painful bump on her left wrist come and go for years. There is currently a hard knot there that is painful especially when lifting something heavy.   Left thumb- has had a nodule come up on her left thumb months ago and is has gotten bigger and more painful since. She has pain with moving her thumb, gripping things, opening jars, etc. 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 She has been taking ibuprofen for pain with some relief.   Reports mom has arthritis but she is unsure what kind.  Smoking status reviewed- non-smoker  Review of Systems- no other joint swelling, rashes, muscle weakness   Objective:  BP 130/72   Pulse 89   Ht 5\' 8"  (1.727 m)   Wt (!) 320 lb 2 oz (145.2 kg)   SpO2 97%   BMI 48.67 kg/m  Vitals and nursing note reviewed  General: well nourished, in no acute distress HEENT: normocephalic, MMM Cardiac: Regular rate Respiratory: no increased work of breathing Extremities: right thumb DIP with nodule, limited ROM of right first MCP joint. Small firm mobile nodule on right wrist.  Skin: warm and dry, no rashes noted Neuro: alert and oriented, no focal deficits  Limited bedside US reveals joint space narrowing and bone spurs of right first MCP joint as well as small ganglion cyst over tendon sheath in 1st compartment of wrist Assessment & Plan:    Primary osteoarthritis of left hand  Discussed tylenol, ibuprofen, topical voltaren, icing, rest. Offered injection today w/ US guidance, patient declined, but may consider in future. Follow up as needed   Ganglion of left wrist  Cyst is small and firm, doubt aspiration would be successful and given location on tendon sheath would refer to hand surgeon if patient  wanted intervention, she declines citing financial reasons/no insurance. Follow up as needed     Return if symptoms worsen or fail to improve.   Dolores Patty, DO Family Medicine Resident PGY-3

## 2018-08-07 ENCOUNTER — Other Ambulatory Visit: Payer: Self-pay

## 2018-08-07 ENCOUNTER — Ambulatory Visit (INDEPENDENT_AMBULATORY_CARE_PROVIDER_SITE_OTHER): Payer: Self-pay | Admitting: Family Medicine

## 2018-08-07 ENCOUNTER — Encounter: Payer: Self-pay | Admitting: Family Medicine

## 2018-08-07 VITALS — BP 132/80 | HR 87 | Wt 322.6 lb

## 2018-08-07 DIAGNOSIS — E119 Type 2 diabetes mellitus without complications: Secondary | ICD-10-CM

## 2018-08-07 DIAGNOSIS — R350 Frequency of micturition: Secondary | ICD-10-CM

## 2018-08-07 DIAGNOSIS — Z794 Long term (current) use of insulin: Secondary | ICD-10-CM

## 2018-08-07 LAB — POCT URINALYSIS DIP (MANUAL ENTRY)
Bilirubin, UA: NEGATIVE
Glucose, UA: 500 mg/dL — AB
Ketones, POC UA: NEGATIVE mg/dL
Leukocytes, UA: NEGATIVE
Nitrite, UA: NEGATIVE
Spec Grav, UA: 1.015 (ref 1.010–1.025)
Urobilinogen, UA: 0.2 E.U./dL
pH, UA: 6.5 (ref 5.0–8.0)

## 2018-08-07 LAB — POCT GLYCOSYLATED HEMOGLOBIN (HGB A1C): HbA1c, POC (controlled diabetic range): 8.3 % — AB (ref 0.0–7.0)

## 2018-08-07 MED ORDER — CEPHALEXIN 500 MG PO CAPS: 500.0000 mg | ORAL_CAPSULE | Freq: Three times a day (TID) | ORAL | 0 refills | Status: DC

## 2018-08-07 MED ORDER — FLUCONAZOLE 150 MG PO TABS: 150.0000 mg | ORAL_TABLET | Freq: Once | ORAL | 0 refills | Status: AC

## 2018-08-07 NOTE — Progress Notes (Signed)
urine

## 2018-08-07 NOTE — Progress Notes (Signed)
HPI 43 year old female who presents for frequency urination.  States that this is been going on for last few days.  States that she has been frequently having of the bathroom, and spent "over an hour in the restroom yesterday".  Of note patient was recently started on Jardiance around 2 weeks ago.  She wonders if she might have a yeast infection or UTI.  States that she has been taking amount water and efforts to hydrate.  She has no other symptoms aside from the frequency.  No burning, painful, or itching.  No bleeding that she can tell.  Hemoglobin A1c performed, has improved to 8.3 from 9.1.  Patient is voicing surprised at this as she states that she has not been eating as well as she was hoping for and not exercising very much due to the coronavirus pandemic.  CC: Frequent urination   ROS:   Review of Systems See HPI for ROS.   CC, SH/smoking status, and VS noted  Objective: BP 132/80   Pulse 87   Wt (!) 322 lb 9.6 oz (146.3 kg)   SpO2 98%   BMI 49.05 kg/m  Gen: Well-appearing 43 year old African-American female, no acute distress, very pleasant  CV: RRR, no murmur Resp: CTAB, no wheezes, non-labored Abd: SNTND, BS present, no guarding or organomegaly Neuro: Alert and oriented, Speech clear, No gross deficits  UA: Greater than 500 glucose, otherwise unremarkable  Assessment and plan:  Urine frequency Discussed case with pharmacist Dr. Raymondo Band and attending Dr. Deirdre Priest.  All agreed that it would be unlikely for patient start having tremendous urinary frequency week and a half after starting Jardiance.  Patient has been drinking a lot of water and her specific gravity is quite dilute so it is possible that her UA is simply too dilute to reveal any underlying infection.  We will treat for urinary tract infection.  Will start Keflex 500 mg every 8 hours for 5 days.  Gave return precautions to patient. -Treat for UTI, Keflex 500 mg every 8 hours 5 days. -Unlikely to be caused  by [place], but will keep on differential.  Type 2 diabetes mellitus without complication, with long-term current use of insulin (HCC) A1c improved from 9.1-8.3.  Continue Lantus 35 units twice daily.  Continue Jardiance 10 mg daily.  Continue aspart 20units prior to meals if CBGs< 200, take 25 units if CBGs 200-299, take 30 units if CBGs 300 or higher.   Continue metformin ER 1 g daily.  Performed foot exam which was without any abnormality. -Continue Lantus 35 units twice daily -Continue Jardiance 10 mg daily -Continue metformin ER 1 g daily -Continue aspart sliding scale as above   Orders Placed This Encounter  Procedures  . POCT urinalysis dipstick  . HgB A1c    Meds ordered this encounter  Medications  . cephALEXin (KEFLEX) 500 MG capsule    Sig: Take 1 capsule (500 mg total) by mouth 3 (three) times daily.    Dispense:  15 capsule    Refill:  0  . fluconazole (DIFLUCAN) 150 MG tablet    Sig: Take 1 tablet (150 mg total) by mouth once for 1 dose.    Dispense:  1 tablet    Refill:  0  . insulin aspart (NOVOLOG) 100 UNIT/ML injection    Sig: Inject 20-30 Units into the skin 3 (three) times daily before meals. 20units prior to meals if CBGs< 200, take 25 units if CBGs 200-299, take 30 units if CBGs 300 or higher.  Dispense:  10 mL    Refill:  11    [name] BuddyJacob Aubreyanna Dorrough MD PGY-2 Family Medicine Resident  08/08/2018 9:38 AM

## 2018-08-07 NOTE — Patient Instructions (Addendum)
It was great seeing you again!  Sorry you had so much trouble with urinary frequency.  It is likely due to all the glucose coming out of your urine.  I discussed this with our pharmacy team and Dr. Deirdre Priest and we feel like his very uncommon for this to happen just because of Jardiance, so we will treat you for UTI.  Also gave you medication to help with a yeast infection as there is a possibility you may have this.  Medication is called cephalexin, you take it 3 times per day for 7 days.  The yeast medication is a one-time medication called Diflucan.

## 2018-08-08 ENCOUNTER — Other Ambulatory Visit: Payer: Self-pay | Admitting: Family Medicine

## 2018-08-08 ENCOUNTER — Encounter: Payer: Self-pay | Admitting: Family Medicine

## 2018-08-08 DIAGNOSIS — R350 Frequency of micturition: Secondary | ICD-10-CM | POA: Insufficient documentation

## 2018-08-08 MED ORDER — INSULIN ASPART 100 UNIT/ML ~~LOC~~ SOLN: 20.0000 [IU] | Freq: Three times a day (TID) | SUBCUTANEOUS | 11 refills | Status: DC

## 2018-08-08 NOTE — Assessment & Plan Note (Addendum)
A1c improved from 9.1-8.3.  Continue Lantus 35 units twice daily.  Continue Jardiance 10 mg daily.  Continue aspart 20units prior to meals if CBGs< 200, take 25 units if CBGs 200-299, take 30 units if CBGs 300 or higher.   Continue metformin ER 1 g daily.  Performed foot exam which was without any abnormality. -Continue Lantus 35 units twice daily -Continue Jardiance 10 mg daily -Continue metformin ER 1 g daily -Continue aspart sliding scale as above

## 2018-08-08 NOTE — Assessment & Plan Note (Signed)
Discussed case with pharmacist Dr. Raymondo Band and attending Dr. Deirdre Priest.  All agreed that it would be unlikely for patient start having tremendous urinary frequency week and a half after starting Jardiance.  Patient has been drinking a lot of water and her specific gravity is quite dilute so it is possible that her UA is simply too dilute to reveal any underlying infection.  We will treat for urinary tract infection.  Will start Keflex 500 mg every 8 hours for 5 days.  Gave return precautions to patient. -Treat for UTI, Keflex 500 mg every 8 hours 5 days. -Unlikely to be caused by [place], but will keep on differential.

## 2018-08-09 MED FILL — VENLAFAXINE HCL ER 150 MG C: 150 | 30 days supply | Qty: 30 | Fill #1

## 2018-08-21 ENCOUNTER — Telehealth: Payer: Self-pay | Admitting: *Deleted

## 2018-08-21 NOTE — Telephone Encounter (Signed)
Pt calling to let Dr. Valentina Lucks know that she is still having diarrhea from the metformin and she would like to discuss starting a different medication. Christen Bame, CMA

## 2018-08-22 NOTE — Telephone Encounter (Signed)
Patient reports increased urination with Jardiance.    She thought is was a urinary tract infection and had treatment.  She has continued to have polyuria.   Blood sugars have been much improved.  100-200 with majority of readings 150-200.  Also reports symptoms of hypolgycemia and one low reading of 80.    Reduced Lantus from 35 BID to 30 units BID.   Reduced Jardiance from 10mg  daily to 5mg  daily - reassess urinary frequency and blood sugar control by phone in 1 week.

## 2018-09-09 ENCOUNTER — Telehealth: Payer: Self-pay | Admitting: Pharmacist

## 2018-09-09 NOTE — Telephone Encounter (Signed)
-----   Message from Leavy Cella, [facility] sent at 08/29/2018  4:23 PM EDT ----- Regarding: SGLT2 inhib f/u F/U for tolerability with lower dose Jaridance 1/2 tab (5mg ) dose.   Did not find drug at her house previous week and asked for another week.

## 2018-09-09 NOTE — Telephone Encounter (Signed)
Phone call f/u to assess use of Jardiance 5mg .   Patient began conversation with statement "you don't want to talk to me today".  She explained she has had some stress and has been in pain (taking ibuprofen consistently) which may explain elevated blood sugars.   Denies trial of Jardiance.  Reports use of Lantus 30 units BID and Novolog 25-30 units typicall however she did take 40 units when her blood sugar was > 400.   She denies hypoglycemia.   Increased Lantus from 30 to 35 units, encouraged continued use of Novolog at current 25-30 units dosing prior to meals.   Encouraged her to reinitiate Jardiance at 5mg  (half tablet) (she stated she would consider but would not promise).   Plan phone call f/u in 3 days.

## 2018-09-13 ENCOUNTER — Telehealth: Payer: Self-pay | Admitting: Pharmacist

## 2018-09-13 NOTE — Telephone Encounter (Signed)
-----   Message from Leavy Cella, [facility] sent at 09/09/2018  9:36 AM EDT ----- Regarding: DM follow up

## 2018-09-13 NOTE — Telephone Encounter (Signed)
No answer.  Left message RE plan for blood sugar assessment and follow-up in the upcoming week.

## 2018-09-18 NOTE — Telephone Encounter (Signed)
Patient leaves VM on nurse line returning [place] call.   Does she need a FU apt with you or Jake? I can call her to schedule if so.

## 2018-09-19 MED ORDER — INSULIN ASPART 100 UNIT/ML ~~LOC~~ SOLN: 20.0000 [IU] | Freq: Three times a day (TID) | SUBCUTANEOUS | 11 refills | Status: DC

## 2018-09-19 MED ORDER — METFORMIN HCL ER (MOD) 1000 MG PO TB24: 1000.0000 mg | ORAL_TABLET | Freq: Every day | ORAL | 3 refills | Status: DC

## 2018-09-19 NOTE — Telephone Encounter (Signed)
Noted and agree. 

## 2018-09-19 NOTE — Telephone Encounter (Signed)
Phone call to patient reports CBGs  144, 155, 261, 118, 124, 195, 220, 269, 122 Denies any readings < 80.  Reports use of  Metformin 1000mg  daily (ran out recently).  Novolog 20-30 units prior to meals Lantus 36 units BID NOT interested in restarting trial of Jardiance at this time.   Spent > 12 minutes with dietary discussion and sliding scale for carbohydrate adjustment.  Patient encouraged to use Novolog 20-30 units with most meals and consider lower doses (10 or 15 units - not skipping dose) more routinely.   Refills sent to [facility] by patient request.   Novolog and Metformin

## 2018-09-23 ENCOUNTER — Telehealth: Payer: Self-pay | Admitting: *Deleted

## 2018-09-23 ENCOUNTER — Ambulatory Visit (INDEPENDENT_AMBULATORY_CARE_PROVIDER_SITE_OTHER): Payer: Self-pay | Admitting: Family Medicine

## 2018-09-23 ENCOUNTER — Other Ambulatory Visit: Payer: Self-pay

## 2018-09-23 ENCOUNTER — Other Ambulatory Visit (HOSPITAL_COMMUNITY)
Admission: RE | Admit: 2018-09-23 | Discharge: 2018-09-23 | Disposition: A | Discharge status: Home / Self Care | Payer: Self-pay | Source: Ambulatory Visit | Attending: Family Medicine | Admitting: Family Medicine

## 2018-09-23 VITALS — BP 125/80 | HR 91 | Wt 333.6 lb

## 2018-09-23 DIAGNOSIS — Z113 Encounter for screening for infections with a predominantly sexual mode of transmission: Secondary | ICD-10-CM | POA: Insufficient documentation

## 2018-09-23 DIAGNOSIS — A599 Trichomoniasis, unspecified: Secondary | ICD-10-CM

## 2018-09-23 DIAGNOSIS — N898 Other specified noninflammatory disorders of vagina: Secondary | ICD-10-CM

## 2018-09-23 LAB — POCT WET PREP (WET MOUNT): Clue Cells Wet Prep Whiff POC: NEGATIVE

## 2018-09-23 MED ORDER — INSULIN LISPRO (1 UNIT DIAL) 100 UNIT/ML (KWIKPEN): 30.0000 [IU] | PEN_INJECTOR | Freq: Three times a day (TID) | SUBCUTANEOUS | 11 refills | Status: DC

## 2018-09-23 MED ORDER — METRONIDAZOLE 500 MG PO TABS: 500.0000 mg | ORAL_TABLET | Freq: Two times a day (BID) | ORAL | 0 refills | Status: DC

## 2018-09-23 NOTE — Telephone Encounter (Deleted)
Sent new prescription for Humalog Pens to [facility].

## 2018-09-23 NOTE — Telephone Encounter (Signed)
New humalog script sent to health department.  Guadalupe [name] MD PGY-3 Family Medicine Resident

## 2018-09-23 NOTE — Telephone Encounter (Signed)
[name] from the [facility].  Pt is approved form humolog quickpens.  Can we send in a new scrip[t for that instead of novolog vials.  To Dr. Valentina Lucks. Christen Bame, CMA

## 2018-09-23 NOTE — Patient Instructions (Signed)
It was great meeting you today!  Your vaginal odor is likely due to something called trichomonas.  The treatment for this is Flagyl 500 mg twice a day for 7 days.  We also performed additional STD testing.  I will give a call with results.

## 2018-09-23 NOTE — Telephone Encounter (Signed)
Pt is requesting a script for diflucan as she feels like she will get a yeast infection because of the antibiotics given her today., Christen Bame, CMA

## 2018-09-24 LAB — HIV ANTIBODY (ROUTINE TESTING W REFLEX): HIV Screen 4th Generation wRfx: NONREACTIVE

## 2018-09-24 LAB — CERVICOVAGINAL ANCILLARY ONLY
Chlamydia: NEGATIVE
Neisseria Gonorrhea: NEGATIVE

## 2018-09-24 LAB — RPR: RPR Ser Ql: NONREACTIVE

## 2018-09-25 MED ORDER — FLUCONAZOLE 150 MG PO TABS: 150.0000 mg | ORAL_TABLET | Freq: Once | ORAL | 0 refills | Status: AC

## 2018-09-25 NOTE — Telephone Encounter (Signed)
Sent prescription for Diflucan  [name] [name] MD PGY-3 Family Medicine Resident

## 2018-09-26 ENCOUNTER — Encounter: Payer: Self-pay | Admitting: Family Medicine

## 2018-09-26 DIAGNOSIS — A599 Trichomoniasis, unspecified: Secondary | ICD-10-CM | POA: Insufficient documentation

## 2018-09-26 NOTE — Progress Notes (Signed)
   HPI 43 year old female who presents for a 2 week history of a fishy odor. She thinks that she has BV as this has presented like this before.  Patient states that she has only been sexually active with one partner and that they have continuously used protection. She is interested in having testing done for all stds.  CC: fishy odor   ROS:   Review of Systems See HPI for ROS.   CC, SH/smoking status, and VS noted  Objective: BP 125/80   Pulse 91   Wt (!) 333 lb 9.6 oz (151.3 kg)   SpO2 98%   BMI 50.72 kg/m  Gen: pleasant 43 year old AA female, no acute distress CV: RRR, no murmur Resp: CTAB, no wheezes, non-labored GU: speculum inserted easily. No foul smelling odor appreciated, no purulent discharge appreciated. Neuro: Alert and oriented, Speech clear, No gross deficits   Assessment and plan:  Trichomonas infection Wet prep positive for trichomonas, will treat with flagyl 500mg  bid for 7 days. No BV or yeast seen. GC/chlamydia, rpr, hiv all negative.   Orders Placed This Encounter  Procedures  . RPR  . HIV antibody (with reflex)  . POCT Wet Prep [facility])    Meds ordered this encounter  Medications  . DISCONTD: metroNIDAZOLE (FLAGYL) 500 MG tablet    Sig: Take 1 tablet (500 mg total) by mouth 2 (two) times daily.    Dispense:  14 tablet    Refill:  0  . metroNIDAZOLE (FLAGYL) 500 MG tablet    Sig: Take 1 tablet (500 mg total) by mouth 2 (two) times daily.    Dispense:  14 tablet    Refill:  0     Guadalupe [name] MD PGY-3 Family Medicine Resident  09/26/2018 12:16 PM

## 2018-09-26 NOTE — Assessment & Plan Note (Signed)
Wet prep positive for trichomonas, will treat with flagyl 500mg  bid for 7 days. No BV or yeast seen. GC/chlamydia, rpr, hiv all negative.

## 2018-10-10 ENCOUNTER — Telehealth: Payer: Self-pay | Admitting: Pharmacist

## 2018-10-10 DIAGNOSIS — E11628 Type 2 diabetes mellitus with other skin complications: Secondary | ICD-10-CM

## 2018-10-10 MED ORDER — LANTUS SOLOSTAR 100 UNIT/ML ~~LOC~~ SOPN: 30.0000 [IU] | PEN_INJECTOR | Freq: Two times a day (BID) | SUBCUTANEOUS | 11 refills | Status: DC

## 2018-10-10 NOTE — Telephone Encounter (Signed)
Phone Follow-Up for Diabetes.   States her blood sugars are doing much better - Most readings in the 100-200 range.  Denies readings < 100.   States diet has been much better - salads and protein shakes.   Regimen of Lantus 35 BID, Humalog 10-20 units prior to meals (rarely 30 units) and metformin.  Not taking Jardiance.  Discussed reducting Lantus dose from 35 BID to 30 units BID if fasting CBGs are < 100.   She expressed understanding of this potential adjustment.

## 2018-10-10 NOTE — Telephone Encounter (Signed)
-----   Message from Leavy Cella, [facility] sent at 09/19/2018  3:30 PM EDT ----- Regarding: DM follow up

## 2018-10-11 NOTE — Telephone Encounter (Signed)
Noted and agree. 

## 2018-10-24 ENCOUNTER — Telehealth: Payer: Self-pay | Admitting: Pharmacist

## 2018-10-24 NOTE — Telephone Encounter (Signed)
Diabetes follow up  Reports doing GREAT.  States fasting CBGs ~ 100 with no readings < 100 in the AM.    She reported a reading of 72 which happened mid morning after she failed to have her breakfast "protein shake".  Reports managing appropriately.   Taking Lantus 30 units BID  Advised to reduce to 25 units BID IF she notices fasting readings < 100.   Encouraged continued efforts with diet and adherence with medication regimen.    Follow up by phone in 2 weeks.

## 2018-10-24 NOTE — Telephone Encounter (Signed)
-----   Message from Leavy Cella, [facility] sent at 10/10/2018  5:51 PM EDT ----- Regarding: DM Follow up

## 2018-10-24 NOTE — Telephone Encounter (Deleted)
-----   Message from Peter G Koval, RPH sent at 10/10/2018  5:51 PM EDT ----- Regarding: DM Follow up   

## 2018-10-25 ENCOUNTER — Telehealth: Payer: Self-pay

## 2018-10-25 NOTE — Telephone Encounter (Signed)
Patient calls nurse line stating she can not afford to pick up her test strips at this time. Patient is wondering if we have samples here in the office?

## 2018-10-25 NOTE — Telephone Encounter (Signed)
Contacted RE availability of sample test strips.  Advised we do not have samples.    She reported feeling low today.  Stating she does not think she ate enough protein.   We discussed further reduction of her lantus from 30 to 25 units daily.  She will consider.

## 2018-11-01 ENCOUNTER — Telehealth: Payer: Self-pay | Admitting: *Deleted

## 2018-11-01 DIAGNOSIS — E11628 Type 2 diabetes mellitus with other skin complications: Secondary | ICD-10-CM

## 2018-11-01 MED ORDER — LANTUS SOLOSTAR 100 UNIT/ML ~~LOC~~ SOPN: 24.0000 [IU] | PEN_INJECTOR | Freq: Two times a day (BID) | SUBCUTANEOUS | Status: DC

## 2018-11-01 MED ORDER — INSULIN LISPRO (1 UNIT DIAL) 100 UNIT/ML (KWIKPEN): 15.0000 [IU] | PEN_INJECTOR | Freq: Three times a day (TID) | SUBCUTANEOUS | Status: DC

## 2018-11-01 NOTE — Telephone Encounter (Signed)
Patient called in follow-up of low blood sugar readings  States she has had a 74 today and has been running low enough to skip her meal time insulin doses.   Following a discussion on need to avoid low BG readings while trying to lose weight we agreed to decrease both insulin typesl  Lanuts was reduced from 32 to 24 units twice daily.  Humalog was adjusted from 20-25 units to 15 units prior to meals.   Plan follow up in ~ 1 week to assess control. Encouraged continued weight loss progress.

## 2018-11-01 NOTE — Telephone Encounter (Signed)
Pt lm on nurse line that her sugars have been dropping and she states that Dr. Valentina Lucks told her to call us if that happened so we could titrate her lantus.  Returned call to find out what her CBGs are running, but no answer.  LMOVM for her to callback. Christen Bame, CMA

## 2018-11-04 NOTE — Telephone Encounter (Signed)
Reviewed and agree.

## 2018-11-07 ENCOUNTER — Telehealth: Payer: Self-pay | Admitting: Pharmacist

## 2018-11-07 NOTE — Telephone Encounter (Signed)
F/U RE Dm control.   Patient reports low reading of 77.  States she has concerns over her blood sugar control with her new eating pattern.   She reports taking  Lantus:  24 units twice a day Humalog:  15 units with each meal twice a day.   Advised to decrease Lantus to 40 units ONCE daily (in the AM preferred).  Complaints of urinary frequency continues to be problematic.  State she does not have time to come in for a visit.   Follow-up planned in ~ 2 weeks.

## 2018-11-07 NOTE — Telephone Encounter (Signed)
-----   Message from Leavy Cella, [facility] sent at 11/01/2018  3:50 PM EDT ----- Regarding: DM

## 2018-11-21 ENCOUNTER — Telehealth: Payer: Self-pay | Admitting: Pharmacist

## 2018-11-21 NOTE — Telephone Encounter (Signed)
-----   Message from Leavy Cella, [facility] sent at 11/07/2018  2:31 PM EDT ----- Regarding: DM control

## 2018-11-22 NOTE — Telephone Encounter (Signed)
Patient contacted in follow-up of Diabetes Management.   Patient reports she has lost "a little-bit of weight" but also asks about a nutrition referral - we discussed options and she was willing to pursue the [place] and Diabetes Management option.  She was provided a phone contact #.   She reports blood sugars routinely in the 100-200 range, lowest 75 (did not eat for extended time period) and highest (> 400 when she "way too much sugar").    She is now taking 30 units BID of Lantus and 15-20 units of Humalog tiwce daily.   We made no changes to medication regimen today.  Ageed that nutrition visit would be a good next step. Encouraged continued weight loss.   Next visit planned with PCP, Dr. Kris Mouton, asked her to call and make appt.   I will plan to follow-up by phone in 2-3 weeks.

## 2018-11-27 ENCOUNTER — Ambulatory Visit (INDEPENDENT_AMBULATORY_CARE_PROVIDER_SITE_OTHER): Payer: Self-pay | Admitting: Family Medicine

## 2018-11-27 ENCOUNTER — Other Ambulatory Visit: Payer: Self-pay

## 2018-11-27 VITALS — BP 135/80 | HR 85 | Wt 330.6 lb

## 2018-11-27 DIAGNOSIS — E11628 Type 2 diabetes mellitus with other skin complications: Secondary | ICD-10-CM

## 2018-11-27 DIAGNOSIS — R2243 Localized swelling, mass and lump, lower limb, bilateral: Secondary | ICD-10-CM

## 2018-11-27 DIAGNOSIS — Z794 Long term (current) use of insulin: Secondary | ICD-10-CM

## 2018-11-27 LAB — POCT GLYCOSYLATED HEMOGLOBIN (HGB A1C): HbA1c, POC (controlled diabetic range): 8.3 % — AB (ref 0.0–7.0)

## 2018-11-27 MED ORDER — ACCU-CHEK AVIVA PLUS VI STRP: ORAL_STRIP | 12 refills | Status: DC

## 2018-11-27 NOTE — Patient Instructions (Addendum)
It was great seeing you again today!  Your A1c is stable at 8.3.  I will let Dr. Valentina Lucks know and I think he will be in touch with you in a couple weeks.  I think given that we are afraid you can have lows that we can hold off on changing things now.  I gave you a written prescription for test strips.  I think for your swelling elevating your legs when you are finally able get a chance to rest at night will be helpful.  He also pick up some over-the-counter compression stockings.  I gave you an example but there are many different brands and sizes out there.  Diameter of your legs is around 18.5 inches for both legs, a sales associate at wherever you go comply help you pick out a size that fits.

## 2018-11-28 LAB — COMPREHENSIVE METABOLIC PANEL
ALT: 15 IU/L (ref 0–32)
AST: 17 IU/L (ref 0–40)
Albumin/Globulin Ratio: 1.7 (ref 1.2–2.2)
Albumin: 4.5 g/dL (ref 3.8–4.8)
Alkaline Phosphatase: 92 IU/L (ref 39–117)
BUN/Creatinine Ratio: 16 (ref 9–23)
BUN: 12 mg/dL (ref 6–24)
Bilirubin Total: 0.2 mg/dL (ref 0.0–1.2)
CO2: 23 mmol/L (ref 20–29)
Calcium: 10 mg/dL (ref 8.7–10.2)
Chloride: 103 mmol/L (ref 96–106)
Creatinine, Ser: 0.77 mg/dL (ref 0.57–1.00)
GFR calc Af Amer: 109 mL/min/{1.73_m2} (ref >59)
GFR calc non Af Amer: 94 mL/min/{1.73_m2} (ref >59)
Globulin, Total: 2.6 g/dL (ref 1.5–4.5)
Glucose: 176 mg/dL — ABNORMAL HIGH (ref 65–99)
Potassium: 4 mmol/L (ref 3.5–5.2)
Sodium: 140 mmol/L (ref 134–144)
Total Protein: 7.1 g/dL (ref 6.0–8.5)

## 2018-12-02 ENCOUNTER — Telehealth: Payer: Self-pay | Admitting: Family Medicine

## 2018-12-02 DIAGNOSIS — R2243 Localized swelling, mass and lump, lower limb, bilateral: Secondary | ICD-10-CM | POA: Insufficient documentation

## 2018-12-02 DIAGNOSIS — M7989 Other specified soft tissue disorders: Secondary | ICD-10-CM | POA: Insufficient documentation

## 2018-12-02 NOTE — Assessment & Plan Note (Signed)
Patient with apparent dependent edema bilateral lower extremity.  She is most concerned about this problem today.  Does not think that she has had increased fluid intake or increased salt intake.  Will get CMP to evaluate liver and kidney function.  If negative abnormality could consider getting echocardiogram at next visit.  Recommended patient to get compression stockings and elevate legs much as possible.

## 2018-12-02 NOTE — Telephone Encounter (Signed)
Please let the patient know that her kidney and liver function test came back without any abnormality. Her kidneys and liver both look good, and she has no electrolyte abnormalities. No explanation for her leg swelling with these results.  [name] [name] MD PGY-3 Family Medicine Resident

## 2018-12-02 NOTE — Progress Notes (Signed)
   HPI 43 year old female who presents for diabetes management and swelling of her bilateral lower extremity.  Patient's A1c is stable at 8.3 from 8.3.  She has had 4 telephone visits with the pharmacy team with minor adjustments being made to her diabetic regimen since then.  Of note she has had a couple of hypoglycemic blood sugars.  Patient states that she is also having bilateral lower extremity swelling.  Not having any symptoms today.  But states that when she is on her feet for a long time swelling is so significant she has trouble standing due to the pain.  Also has trouble getting her shoes on.  She is not having any shortness of breath or respiratory issues.   CC: Bilateral lower extremity swelling, diabetes follow-up   ROS:   Review of Systems See HPI for ROS.   CC, SH/smoking status, and VS noted  Objective: BP 135/80   Pulse 85   Wt (!) 330 lb 9.6 oz (150 kg)   SpO2 98%   BMI 50.27 kg/m  Gen: 43 year old African-American female, no acute distress, resting comfortably CV: RRR, no murmur Resp: CTAB, no wheezes, non-labored Neuro: Alert and oriented, Speech clear, No gross deficits Bilateral lower extremity: No edema or signs of fluid overload noted.  Skin is warm and dry.  No neurologic deficit.  Assessment and plan:  Diabetes mellitus (HCC) A1c stable at 8.3.  Currently taking glargine 30 units twice daily.  Taking Humalog 15 to to 20 units twice daily.  Patient does not want to make any medication adjustments today.  Would like to follow-up Dr. Valentina Lucks in a couple weeks.  Foot exam performed.  Patient needs ophthalmology exam and urine microalbumin at subsequent visits.  Localized swelling of lower extremity, bilateral Patient with apparent dependent edema bilateral lower extremity.  She is most concerned about this problem today.  Does not think that she has had increased fluid intake or increased salt intake.  Will get CMP to evaluate liver and kidney function.  If  negative abnormality could consider getting echocardiogram at next visit.  Recommended patient to get compression stockings and elevate legs much as possible.   Orders Placed This Encounter  Procedures  . Comprehensive metabolic panel    Order Specific Question:   Has the patient fasted?    Answer:   No  . HgB A1c    Meds ordered this encounter  Medications  . glucose blood (ACCU-CHEK AVIVA PLUS) test strip    Sig: Use as instructed    Dispense:  100 each    Refill:  12    Guadalupe [name] MD PGY-3 Family Medicine Resident  12/02/2018 11:33 AM

## 2018-12-02 NOTE — Assessment & Plan Note (Signed)
A1c stable at 8.3.  Currently taking glargine 30 units twice daily.  Taking Humalog 15 to to 20 units twice daily.  Patient does not want to make any medication adjustments today.  Would like to follow-up Dr. Valentina Lucks in a couple weeks.  Foot exam performed.  Patient needs ophthalmology exam and urine microalbumin at subsequent visits.

## 2018-12-03 NOTE — Telephone Encounter (Signed)
Pt informed.  She wants to know what the next steps are since her legs are still swollen ( about the same).  Christen Bame, CMA

## 2018-12-06 ENCOUNTER — Other Ambulatory Visit: Payer: Self-pay | Admitting: Family Medicine

## 2018-12-11 ENCOUNTER — Telehealth: Payer: Self-pay | Admitting: Pharmacist

## 2018-12-11 NOTE — Telephone Encounter (Signed)
Attempted call RE Dm follow up.  Left message that I would attempt call in the next two days.   Plan to attempt call again soon.

## 2018-12-19 ENCOUNTER — Telehealth: Payer: Self-pay | Admitting: Pharmacist

## 2018-12-19 DIAGNOSIS — E11628 Type 2 diabetes mellitus with other skin complications: Secondary | ICD-10-CM

## 2018-12-19 MED ORDER — LANTUS SOLOSTAR 100 UNIT/ML ~~LOC~~ SOPN: 30.0000 [IU] | PEN_INJECTOR | Freq: Two times a day (BID) | SUBCUTANEOUS | Status: DC

## 2018-12-19 MED ORDER — INSULIN LISPRO (1 UNIT DIAL) 100 UNIT/ML (KWIKPEN): 20.0000 [IU] | PEN_INJECTOR | Freq: Three times a day (TID) | SUBCUTANEOUS | Status: DC

## 2018-12-19 NOTE — Telephone Encounter (Deleted)
-----   Message from Leavy Cella, [facility] sent at 12/13/2018 12:41 PM EDT ----- Regarding: DM control

## 2018-12-19 NOTE — Telephone Encounter (Signed)
Reviewed and agree.

## 2018-12-19 NOTE — Telephone Encounter (Signed)
Follow-up of blood sugar control and insulin dosing.   Patient reports recent poor control due to running out of her supply of medication.  She now has her supply in hand and reports control is 135-290 with majority of readings 190-250.    Continue Lantus at dose of 30 units twice daily.   Increased Humalog dose to 20 units if pre-prandial BG i200 or less AND 25 units if s > 200.   Plan phone follow-up in 2-3 weeks to reassess control.

## 2019-01-09 ENCOUNTER — Telehealth: Payer: Self-pay | Admitting: Pharmacist

## 2019-01-09 DIAGNOSIS — E11628 Type 2 diabetes mellitus with other skin complications: Secondary | ICD-10-CM

## 2019-01-09 MED ORDER — LANTUS SOLOSTAR 100 UNIT/ML ~~LOC~~ SOPN: 25.0000 [IU] | PEN_INJECTOR | Freq: Two times a day (BID) | SUBCUTANEOUS | Status: DC

## 2019-01-09 MED ORDER — INSULIN LISPRO (1 UNIT DIAL) 100 UNIT/ML (KWIKPEN): 25.0000 [IU] | PEN_INJECTOR | Freq: Three times a day (TID) | SUBCUTANEOUS | 99 refills | Status: DC

## 2019-01-09 NOTE — Telephone Encounter (Signed)
-----   Message from Leavy Cella, [facility] sent at 12/19/2018  2:42 PM EDT ----- Regarding: DM  - insulin adjustment

## 2019-01-09 NOTE — Telephone Encounter (Signed)
Noted and agree. 

## 2019-01-09 NOTE — Telephone Encounter (Signed)
Phone call follow-up RE Diabetes control.   Patient reports she has had a decrease in appetite and has focused mainly on drinking her calories.   She expressed stress with school which she recognizes will improve next month when classes are over.   She reports blood sugars are "low" but reports readings of 190-250 with majority of readings in the low 200s.   Consistently eats dinner Tuesday PM, Wednesday PM, and lunch on Monday.   Discussed current doses and decided to:  Take Lantus 25 units twice daily  Take Humalog 25 units prior to most meals, if pre-meal CBG is < 200 we discussed using dose of 15 units.   Encouraged her to attempt consistent meal intake and wished her success with finishing classes for the semester.  Plan to follow-up by phone in 2 weeks.

## 2019-01-23 ENCOUNTER — Other Ambulatory Visit: Payer: Self-pay | Admitting: Family Medicine

## 2019-01-23 ENCOUNTER — Telehealth: Payer: Self-pay | Admitting: Pharmacist

## 2019-01-23 NOTE — Telephone Encounter (Signed)
Phone Follow-up RE diabetes control.   She was very positive about her current control.   Patient was busy but shared that her blood sugars are improved with use of higher dose of mealtime insulin 25 units.   We discussed need for follow-up with non face-to-face encounters as our office preference for the near future.  She expressed understanding and was willing to handle additional visits via the telephone/virtual route.   Continued basal/bolus insulins at same doses.    Follow-up planned in 2 weeks.

## 2019-01-24 ENCOUNTER — Other Ambulatory Visit: Payer: Self-pay | Admitting: Family Medicine

## 2019-02-05 ENCOUNTER — Telehealth: Payer: Self-pay | Admitting: Pharmacist

## 2019-02-05 NOTE — Telephone Encounter (Signed)
Phone follow-up RE diabetes - blood sugar control.   Patient denies any symptoms or readings c/w hypoglycemia.  Her lowest blood sugar reading reported was 117 and highest was 304.  She reported the majority of her readings are in the 100-200 range.  We agreed to make no changes to her medication regimen today.  Her school semester has finished and she was excited that her stress level was now less.    I plan to follow-up with her by phone in 2-3 weeks.

## 2019-02-08 ENCOUNTER — Other Ambulatory Visit: Payer: Self-pay

## 2019-02-08 DIAGNOSIS — Z20822 Contact with and (suspected) exposure to covid-19: Secondary | ICD-10-CM

## 2019-02-10 ENCOUNTER — Other Ambulatory Visit: Payer: Self-pay | Admitting: Family Medicine

## 2019-02-11 LAB — NOVEL CORONAVIRUS, NAA: SARS-CoV-2, NAA: NOT DETECTED

## 2019-02-25 ENCOUNTER — Ambulatory Visit (HOSPITAL_COMMUNITY)
Admission: EM | Admit: 2019-02-25 | Discharge: 2019-02-25 | Disposition: A | Discharge status: Home / Self Care | Payer: Self-pay | Attending: Family Medicine | Admitting: Family Medicine

## 2019-02-25 ENCOUNTER — Encounter (HOSPITAL_COMMUNITY): Payer: Self-pay

## 2019-02-25 DIAGNOSIS — G5602 Carpal tunnel syndrome, left upper limb: Secondary | ICD-10-CM

## 2019-02-25 DIAGNOSIS — M67432 Ganglion, left wrist: Secondary | ICD-10-CM

## 2019-02-25 DIAGNOSIS — G5622 Lesion of ulnar nerve, left upper limb: Secondary | ICD-10-CM

## 2019-02-25 DIAGNOSIS — M654 Radial styloid tenosynovitis [de Quervain]: Secondary | ICD-10-CM

## 2019-02-25 MED ORDER — NAPROXEN 500 MG PO TABS: 500.0000 mg | ORAL_TABLET | Freq: Two times a day (BID) | ORAL | 0 refills | Status: DC

## 2019-02-25 MED ORDER — PREDNISONE 20 MG PO TABS: 20.0000 mg | ORAL_TABLET | Freq: Two times a day (BID) | ORAL | 0 refills | Status: DC

## 2019-02-25 NOTE — ED Triage Notes (Signed)
Pt presents to the UC with left wrist pain x 2 months. Pt states having tingling and numbness in the left ring finger and left pinky finger x 4 days. Pt report having electric shock pain going from her left wrist to the elbow.

## 2019-02-25 NOTE — ED Provider Notes (Signed)
[facility]    CSN: [id number] Arrival date & time: 02/25/19  1744      History   Chief Complaint Chief Complaint  Patient presents with  . Appointment    1800  . Wrist Pain    HPI [name] [name] is a 43 y.o. female.   HPI  Patient is a poorly controlled diabetic with hemoglobin A1c that stays above 8. She states she has a long history of carpal tunnel syndrome both hands left greater than right.  She is chosen not to have this operated upon She currently has pain and numbness in her left hand.  It has become severe over the last 4 days.  She states that in addition to her thumb index and long finger causing symptoms, she is also having symptoms in her ring and small finger.  She has pain that radiates up into her forearm.  She has pain around her elbow. She has a known ganglion in her left wrist.  She has pain with movement of her wrist. She works as a Psychologist, clinical.  She uses both hands fairly frequently, but has no repetitive motion.  She is right-hand dominant She has not tried any medication for the pain.  Past Medical History:  Diagnosis Date  . Allergy   . Anemia   . Bipolar 1 disorder [facility])    Sees Dr. Pablo Ledger, psychology,  [phone]  . Carpal tunnel syndrome   . Depression   . Diabetes mellitus without complication ([place])   . Family history of adverse reaction to anesthesia    mother & son have difficulty waking  . Fatigue   . Lung nodule seen on imaging study 09/24/2013  . Sleep apnea    uses cpap    Patient Active Problem List   Diagnosis Date Noted  . Localized swelling of lower extremity, bilateral 12/02/2018  . Trichomonas infection 09/26/2018  . Urine frequency 08/08/2018  . Primary osteoarthritis of left hand 07/26/2018  . Ganglion of left wrist 07/26/2018  . OSA on CPAP 02/09/2016  . Diabetes mellitus with hyperglycemia ([place]) 11/25/2015  . Type 2 diabetes mellitus without complication, with long-term current use of insulin  ([place])   . Diabetes mellitus ([place]) 07/11/2012  . 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 Vitamin D deficiency 09/20/2007  . MENOPAUSE, SURGICAL 04/24/2007  . Diaphragmatic hernia 11/20/2006  . Hyperlipidemia 05/03/2006  . OBESITY, NOS 05/03/2006  . ANEMIA, IRON DEFICIENCY, UNSPEC. 05/03/2006  . Bipolar I disorder ([place]) 05/03/2006  . ANXIETY 05/03/2006  . HYPERTENSION, BENIGN SYSTEMIC 05/03/2006  . PEPTIC ULCER DIS., UNSPEC. W/O OBSTRUCTION 05/03/2006    Past Surgical History:  Procedure Laterality Date  . ABDOMINAL HYSTERECTOMY    . CHOLECYSTECTOMY N/A 06/22/2017   Procedure: LAPAROSCOPIC CHOLECYSTECTOMY;  Surgeon: Kinsinger, Arta Bruce, MD;  Location: [facility];  Service: General;  Laterality: N/A;  . I & D EXTREMITY Right 09/14/2015   Procedure: IRRIGATION AND DEBRIDEMENT RIGHT MIDDLE FINGER;  Surgeon: Roseanne Kaufman, MD;  Location: [facility];  Service: Orthopedics;  Laterality: Right;  . TUBAL LIGATION      OB History   No obstetric history on file.      Home Medications    Prior to Admission medications   Medication Sig Start Date End Date Taking? Authorizing Provider  Blood Glucose Monitoring Suppl (ACCU-CHEK AVIVA PLUS) w/Device KIT Use to check blood sugar three times daily or as directed. 03/22/18   Guadalupe [name], MD  glucose blood (ACCU-CHEK AVIVA PLUS) test strip Use as instructed  11/27/18   Guadalupe [name], MD  ibuprofen (ADVIL,MOTRIN) 200 MG tablet Take 200-400 mg by mouth every 6 (six) hours as needed (for pain or headaches).    [provider]  Insulin Glargine (LANTUS SOLOSTAR) 100 UNIT/ML Solostar Pen Inject 25 Units into the skin 2 (two) times daily before a meal. Decrease to 30 units twice daily if blood sugars are less than 100. 01/09/19   Hensel, Jamal Collin, MD  insulin lispro (HUMALOG KWIKPEN) 100 UNIT/ML KwikPen Inject 0.25 mLs (25 Units total) into the skin 3 (three) times daily before meals. 01/09/19   Zenia Resides, MD  lamoTRIgine (LAMICTAL) 100 MG tablet TAKE  2 TABLETS (200MG TOTAL) BY MOUTH DAILY. 02/11/19   Guadalupe [name], MD  metFORMIN (GLUMETZA) 1000 MG (MOD) 24 hr tablet Take 1 tablet (1,000 mg total) by mouth at bedtime. 09/19/18   Zenia Resides, MD  naproxen (NAPROSYN) 500 MG tablet Take 1 tablet (500 mg total) by mouth 2 (two) times daily. 02/25/19   Raylene Everts, MD  predniSONE (DELTASONE) 20 MG tablet Take 1 tablet (20 mg total) by mouth 2 (two) times daily with a meal. 02/25/19   Raylene Everts, MD  venlafaxine XR (EFFEXOR-XR) 150 MG 24 hr capsule TAKE 1 CAPSULE (150 MG TOTAL) BY MOUTH DAILY. 01/27/19   Guadalupe [name], MD  gabapentin (NEURONTIN) 300 MG capsule Take 1 capsule (300 mg total) by mouth 3 (three) times daily. 03/22/18 02/25/19  Guadalupe [name], MD    Family History Family History  Problem Relation Age of Onset  . Hypertension Mother   . Diabetes Mellitus II Mother   . Congestive Heart Failure Father   . Hypertension Father   . Diabetes Mellitus II Father     Social History Social History   Tobacco Use  . Smoking status: Never Smoker  . Smokeless tobacco: Never Used  Substance Use Topics  . Alcohol use: Yes    Alcohol/week: 0.0 standard drinks    Comment: occ  . Drug use: No     Allergies   Hydromorphone hcl, Meperidine hcl, Morphine, Lisinopril, Sumatriptan, Tape, and Zolmitriptan   Review of Systems Review of Systems  Constitutional: Negative for chills and fever.  HENT: Negative for congestion and hearing loss.   Eyes: Negative for pain.  Respiratory: Negative for cough and shortness of breath.   Cardiovascular: Negative for chest pain and leg swelling.  Gastrointestinal: Negative for abdominal pain, constipation and diarrhea.  Genitourinary: Negative for dysuria and frequency.  Musculoskeletal: Negative for myalgias.  Neurological: Positive for weakness and numbness. Negative for dizziness, seizures and headaches.  Psychiatric/Behavioral: The patient is not nervous/anxious.       Physical Exam Triage Vital Signs ED Triage Vitals  Enc Vitals Group     BP 02/25/19 1829 140/89     Pulse Rate 02/25/19 1829 82     Resp 02/25/19 1829 15     Temp 02/25/19 1829 98.3 F (36.8 C)     Temp Source 02/25/19 1829 Oral     SpO2 02/25/19 1829 100 %     Weight --      Height --      Head Circumference --      Peak Flow --      Pain Score 02/25/19 1827 8     Pain Loc --      Pain Edu? --      Excl. in [place]? --    No data found.  Updated Vital Signs BP 140/89 (BP  Location: Right Arm)   Pulse 82   Temp 98.3 F (36.8 C) (Oral)   Resp 15   SpO2 100%   Visual Acuity Right Eye Distance:   Left Eye Distance:   Bilateral Distance:    Right Eye Near:   Left Eye Near:    Bilateral Near:     Physical Exam Vitals reviewed.  Constitutional:      General: She is not in acute distress.    Appearance: She is well-developed. She is obese.     Comments: Appears uncomfortable.  Holds left arm close to body  HENT:     Head: Normocephalic and atraumatic.     Mouth/Throat:     Comments: Mask in place Eyes:     Conjunctiva/sclera: Conjunctivae normal.     Pupils: Pupils are equal, round, and reactive to light.  Cardiovascular:     Rate and Rhythm: Normal rate and regular rhythm.     Heart sounds: Normal heart sounds.  Pulmonary:     Effort: Pulmonary effort is normal. No respiratory distress.     Breath sounds: Normal breath sounds.  Abdominal:     General: There is no distension.     Palpations: Abdomen is soft.  Musculoskeletal:        General: Normal range of motion.     Cervical back: Normal range of motion.     Comments: Left upper extremity is examined.  There is a flattened thenar eminence.  Consistent with longstanding carpal tunnel syndrome.  Numbness in the ring long and thumb.  Grip strength is weak.  Finkelstein's positive.  Phalen's positive.  Tinel's positive.  Tenderness also in the Guyon's canal.  Skin:    General: Skin is warm and dry.   Neurological:     Mental Status: She is alert.     Sensory: Sensory deficit present.     Motor: Weakness present.  Psychiatric:        Mood and Affect: Mood normal.        Behavior: Behavior normal.      UC Treatments / Results  Labs (all labs ordered are listed, but only abnormal results are displayed) Labs Reviewed - No data to display  EKG   Radiology No results found.  Procedures Procedures (including critical care time)  Medications Ordered in UC Medications - No data to display  Initial Impression / Assessment and Plan / UC Course  I have reviewed the triage vital signs and the nursing notes.  Pertinent labs & imaging results that were available during my care of the patient were reviewed by me and considered in my medical decision making (see chart for details).     I explained to the patient that she has evidence of multiple problems in her left upper extremity.  I showed her the physical exam findings consistent with her ganglion, de Quervain's, carpal tunnel syndrome, cubital tunnel syndrome.  I told her she continues to ignore her symptoms that she is going to lose the use of her hands, left first then right. Discussed etiology Discussed the impact of poorly controlled diabetes and obesity Reviewed need for referral and then treatment Final Clinical Impressions(s) / UC Diagnoses   Final diagnoses:  Radial styloid tenosynovitis (de quervain)  Ganglion of left wrist  Carpal tunnel syndrome on left  Cubital tunnel syndrome on left     Discharge Instructions     Limit use of left hand Take the 5 d of prednisone Watch your sugar Take naproxen 2  x a day after prednisone CALL HAND DOCTOR ( neurosurgery, plastic surgery or hand specialist/ortho )    ED Prescriptions    Medication Sig Dispense Auth. Provider   predniSONE (DELTASONE) 20 MG tablet Take 1 tablet (20 mg total) by mouth 2 (two) times daily with a meal. 10 tablet Raylene Everts, MD    naproxen (NAPROSYN) 500 MG tablet Take 1 tablet (500 mg total) by mouth 2 (two) times daily. 30 tablet Raylene Everts, MD     PDMP not reviewed this encounter.   Raylene Everts, MD 02/25/19 Thom Chimes

## 2019-02-25 NOTE — Discharge Instructions (Addendum)
Limit use of left hand Take the 5 d of prednisone Watch your sugar Take naproxen 2 x a day after prednisone CALL HAND DOCTOR ( neurosurgery, plastic surgery or hand specialist/ortho )

## 2019-03-15 ENCOUNTER — Other Ambulatory Visit: Payer: Self-pay | Admitting: *Deleted

## 2019-03-15 DIAGNOSIS — Z1152 Encounter for screening for COVID-19: Secondary | ICD-10-CM

## 2019-03-16 LAB — SPECIMEN STATUS REPORT

## 2019-03-16 LAB — NOVEL CORONAVIRUS, NAA: SARS-CoV-2, NAA: NOT DETECTED

## 2019-03-17 ENCOUNTER — Other Ambulatory Visit: Payer: Self-pay

## 2019-03-17 MED ORDER — INSULIN LISPRO (1 UNIT DIAL) 100 UNIT/ML (KWIKPEN): 25.0000 [IU] | PEN_INJECTOR | Freq: Three times a day (TID) | SUBCUTANEOUS | 99 refills | Status: DC

## 2019-03-17 NOTE — Telephone Encounter (Signed)
Pt calls nurse line stating that she needs refill to be sent in to the health department. Patient was seen in urgent care on 12/22 and given a steroids (prednisone). Due to taking steroids, patient has had to increase the amount of insulin and now requires a refill sooner than expected.   To PCP  Veronda Prude, RN

## 2019-03-20 ENCOUNTER — Telehealth: Payer: Self-pay | Admitting: Pharmacist

## 2019-03-20 NOTE — Telephone Encounter (Signed)
Call to F/U DM control  No answer - left message requesting call back to discuss current control of DM.   No additional Phone F/U planned at this time.

## 2019-03-21 ENCOUNTER — Other Ambulatory Visit: Payer: Self-pay

## 2019-03-21 ENCOUNTER — Telehealth: Payer: Self-pay

## 2019-03-21 ENCOUNTER — Telehealth: Payer: Self-pay | Admitting: Pharmacist

## 2019-03-21 DIAGNOSIS — E11628 Type 2 diabetes mellitus with other skin complications: Secondary | ICD-10-CM

## 2019-03-21 MED ORDER — INSULIN LISPRO (1 UNIT DIAL) 100 UNIT/ML (KWIKPEN): 20.0000 [IU] | PEN_INJECTOR | Freq: Three times a day (TID) | SUBCUTANEOUS | 6 refills | Status: DC

## 2019-03-21 MED ORDER — LANTUS SOLOSTAR 100 UNIT/ML ~~LOC~~ SOPN: 35.0000 [IU] | PEN_INJECTOR | Freq: Two times a day (BID) | SUBCUTANEOUS | Status: DC

## 2019-03-21 MED ORDER — INSULIN LISPRO (1 UNIT DIAL) 100 UNIT/ML (KWIKPEN): 20.0000 [IU] | PEN_INJECTOR | Freq: Three times a day (TID) | SUBCUTANEOUS | Status: DC

## 2019-03-21 MED ORDER — LANTUS SOLOSTAR 100 UNIT/ML ~~LOC~~ SOPN: 35.0000 [IU] | PEN_INJECTOR | Freq: Two times a day (BID) | SUBCUTANEOUS | 6 refills | Status: DC

## 2019-03-21 NOTE — Telephone Encounter (Signed)
[facility] called. They did not get the rx for the Humalog and the Lantus that was sent over today. Please resend. I am sending this to both Dr. Leveda Anna and Dr. Raymondo Band.. Sunday Spillers, CMA

## 2019-03-21 NOTE — Telephone Encounter (Signed)
Patient returned call and shared that her blood sugars have been "very high".   She reported a prednisone burst in late December and a steroid shot 7 days ago.   She has seen readings >300 consistently and frequently > 400mg /dl  She adjusted her own insulin and is currently taking: Lantus 60 units once daily Humalog 15-25 units prior to meals. (twice daily typically).   We discussed that steroid shot should be waning in effect on blood glucose over the next several days.   Adjusted Lantus dose to 35 units TWICE Daily  And Humalog to 20-30 units prior to meals Patient verbalized understanding of plan, is aware of S/Sx of hypoglycemia and plans to call me in 6 days for reevaluation and adjustment by phone.

## 2019-03-21 NOTE — Telephone Encounter (Signed)
Noted and agree. 

## 2019-03-22 ENCOUNTER — Other Ambulatory Visit: Payer: Self-pay

## 2019-03-22 DIAGNOSIS — Z20822 Contact with and (suspected) exposure to covid-19: Secondary | ICD-10-CM

## 2019-03-23 LAB — NOVEL CORONAVIRUS, NAA: SARS-CoV-2, NAA: NOT DETECTED

## 2019-03-25 ENCOUNTER — Telehealth: Payer: Self-pay

## 2019-03-25 NOTE — Telephone Encounter (Signed)
Pt calling nurse line regarding questions pertaining to returning to work after exposure to COVID. Per patient, three members in household have tested positive. Pt continues to test negative and does not currently have any symptoms. Pt asking when it is safe to return to work due to members of family continuing to test positive.  To PCP  Veronda Prude, RN

## 2019-03-27 ENCOUNTER — Other Ambulatory Visit: Payer: Self-pay

## 2019-03-27 ENCOUNTER — Telehealth (INDEPENDENT_AMBULATORY_CARE_PROVIDER_SITE_OTHER): Payer: Self-pay | Admitting: Family Medicine

## 2019-03-27 DIAGNOSIS — Z20822 Contact with and (suspected) exposure to covid-19: Secondary | ICD-10-CM

## 2019-03-27 NOTE — Assessment & Plan Note (Signed)
Advised Ms. control to continue her efforts at isolating herself from those with Covid in her household, but it is almost impossible to completely isolate when sharing a household with someone infected.  If she and her family members remain asymptomatic, she may go back to work on 2/1, which is 14 days after the last positive result in her family.

## 2019-03-27 NOTE — Telephone Encounter (Signed)
Virtual visit made for patient to discuss when to return to work safely.   Veronda Prude, RN

## 2019-03-27 NOTE — Progress Notes (Signed)
[facility] Telemedicine Visit  Patient consented to have virtual visit. Method of visit: Video  Encounter participants: Patient: [name] [name] - located at home Provider: Lennox Solders - located at [facility] Others (if applicable): none  Chief Complaint: Covid exposure  HPI:  Ms. Yott reports that several family members in her household has had Covid although she tested negative on 1/16.  She had a daughter who had loss of taste, nausea, vomiting, and a cough, but the symptoms have resolved.  One of her sons tested positive on Saturday 1/16 but is asymptomatic.  They are staying apart in her household and disinfecting all surfaces and washing hands frequently, but they still drive together at times and share rooms at times.  She is currently quarantining for 14 days after the last positive result on 1/16, so she is hoping to return to work on 2/1.  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  Overall everyone is doing well with her family.  She does not need a note or medications today.  ROS: per HPI  Pertinent PMHx: Type 2 diabetes, obesity, hyperlipidemia, anemia  Exam:  Respiratory: No cough or shortness of breath on video  Assessment/Plan:  Close exposure to COVID-19 virus Advised Ms. control to continue her efforts at isolating herself from those with Covid in her household, but it is almost impossible to completely isolate when sharing a household with someone infected.  If she and her family members remain asymptomatic, she may go back to work on 2/1, which is 14 days after the last positive result in her family.    Time spent during visit with patient: 10 minutes

## 2019-04-03 ENCOUNTER — Telehealth: Payer: Self-pay | Admitting: *Deleted

## 2019-04-03 NOTE — Telephone Encounter (Signed)
Attempted phone call to assess glycemic control and discuss therapy following notification from [facility] department sharing non-adherence with metformin refill data.   No answer, left message requesting call back.  I will try again early next week if I do not hear from patient.

## 2019-04-03 NOTE — Telephone Encounter (Signed)
[facility] calls to report a possible non-compliance with medications.  Last fill of Metformin ER 500mg  BID was last filled for a 30 day supply in 09/2018 but has not been filed since.    They would like to know if pt is still on this medication.  10/2018, CMA

## 2019-04-08 ENCOUNTER — Telehealth: Payer: Self-pay | Admitting: Pharmacist

## 2019-04-08 MED ORDER — METFORMIN HCL ER 500 MG PO TB24: 500.0000 mg | ORAL_TABLET | Freq: Every day | ORAL | 3 refills | Status: DC

## 2019-04-08 MED ORDER — METFORMIN HCL ER 500 MG PO TB24: 500.0000 mg | ORAL_TABLET | Freq: Two times a day (BID) | ORAL | 3 refills | Status: DC

## 2019-04-08 MED ORDER — METFORMIN HCL ER (MOD) 1000 MG PO TB24: 1000.0000 mg | ORAL_TABLET | Freq: Every day | ORAL | 3 refills | Status: DC

## 2019-04-08 NOTE — Telephone Encounter (Signed)
[facility] calling again to check if patient is to receive refill of metformin, due to medication not being dispensed since July 2020.   Veronda Prude, RN

## 2019-04-08 NOTE — Addendum Note (Signed)
Addended by: Kathrin Ruddy on: 04/08/2019 02:59 PM   Modules accepted: Orders

## 2019-04-08 NOTE — Telephone Encounter (Signed)
Reclarified dosing as 2 x 500mg  XR  QHS  NOT BID as previously sent.  I apologized for the confusion.

## 2019-04-08 NOTE — Telephone Encounter (Addendum)
Call returned from pharmacy requesting metformin ER dose interchange.   Sent new RX.  Deleted glumetza from patient med list.  Metformin 500 ER dose is 2 tablets TWICE daily.

## 2019-04-08 NOTE — Addendum Note (Signed)
Addended by: Kathrin Ruddy on: 04/08/2019 02:43 PM   Modules accepted: Orders

## 2019-04-08 NOTE — Telephone Encounter (Signed)
Phoned patient and discussed blood sugar control.  She reports taking Lantus 35 units twice daily and Humalog 25-30 units prior to meals.  Her 14 and 28 day average blood sugars are 225-230.    She admitted non-adherence with Metformin and requests additional refill at [facility].  She states she will restart and be adherent.  Following discussion we agreed to keep her insulin doses unchanged as she is restarting metformin.    She was open to a follow-up call in 10-14 days.  I will plan to reassess control after restart of metformin.

## 2019-04-22 ENCOUNTER — Telehealth: Payer: Self-pay | Admitting: Pharmacist

## 2019-04-22 DIAGNOSIS — Z794 Long term (current) use of insulin: Secondary | ICD-10-CM

## 2019-04-22 DIAGNOSIS — E119 Type 2 diabetes mellitus without complications: Secondary | ICD-10-CM

## 2019-04-22 DIAGNOSIS — E11628 Type 2 diabetes mellitus with other skin complications: Secondary | ICD-10-CM

## 2019-04-22 MED ORDER — LANTUS SOLOSTAR 100 UNIT/ML ~~LOC~~ SOPN: 40.0000 [IU] | PEN_INJECTOR | Freq: Two times a day (BID) | SUBCUTANEOUS | Status: DC

## 2019-04-22 NOTE — Telephone Encounter (Signed)
-----   Message from Kathrin Ruddy, [facility] sent at 04/08/2019  2:21 PM EST ----- Regarding: DM control - restart metformin

## 2019-04-22 NOTE — Assessment & Plan Note (Signed)
Contacted RE diabetes control.   Patient reports continued poor control with blood glucose readings 200 - mid 300s.  Denies any readings in the 100s.   Patient does report restarting metformin and is tolerating well.  She denies any improvement in blood glucose readings since restarting or since last contact.   She reports currently taking  Lantus 35 units BID and Humalog SSI regimen of 25units if pre-meal is 200-300 and 30 units if pre-meal is > 300.   She believes her recent poor control is likely due to back pain (6/10 today).  She noted concern for weight gain.  She completed a steroid burst in Dec/Jan and is no longer taking any steroid tx.   Plan:  Increase Lantus (insulin glargine) from 35 to 40 units BID.   Continue same Humalog regimen at this time.  Reevaluated control with phone call in 6 days.

## 2019-04-22 NOTE — Telephone Encounter (Signed)
Noted and agree. 

## 2019-04-22 NOTE — Telephone Encounter (Signed)
Contacted RE diabetes control.   Patient reports continued poor control with blood glucose readings 200 - mid 300s.  Denies any readings in the 100s.   Patient does report restarting metformin and is tolerating well.  She denies any improvement in blood glucose readings since restarting or since last contact.   She reports currently taking  Lantus 35 units BID and Humalog SSI regimen of 25units if pre-meal is 200-300 and 30 units if pre-meal is > 300.   She believes her recent poor control is likely due to back pain (6/10 today).  She noted concern for weight gain.  She completed a steroid burst in Dec/Jan and is no longer taking any steroid tx.   Plan:  Increase Lantus (insulin glargine) from 35 to 40 units BID.   Continue same Humalog regimen at this time.  Reevaluated control with phone call in 6 days.  

## 2019-04-23 ENCOUNTER — Telehealth: Payer: Self-pay | Admitting: Pharmacist

## 2019-04-23 MED ORDER — INSULIN LISPRO (1 UNIT DIAL) 100 UNIT/ML (KWIKPEN): 30.0000 [IU] | PEN_INJECTOR | Freq: Three times a day (TID) | SUBCUTANEOUS | Status: DC

## 2019-04-23 NOTE — Telephone Encounter (Signed)
Patient called and immediately expressed surprise that she was talking to me.  She admitted she wanted to leave a voice mail.  Unsure why that was the case except for possibly embarrassment.   She reports continues blood sugar readings 200-mid 300s but expresses concern over how her blood sugar increased rapidly when she consumed some "regular" ginger ale.  She was alarmed at how high and fast her blood sugar changed.  We discussed the rapid change is the reason we use regular soda and juice to correct low blood sugars.  She was asked to limit regular soda and juice.  She denies any reading less than 100 AND states she can get her blood sugar to low 100s if she doesn't eat anything.   Her meal time Humalog dosing was also discussed.  She is doing a variable dose of Humalog based on pre-prandial.  We increased that dose to 30 units if blood sugar was 100-299 and 35 units if over 300.   She was willing to follow this plan for 5 days and then discuss additional options.  She verbalized understanding of treatment plan.

## 2019-04-24 NOTE — Telephone Encounter (Signed)
Noted and agree. 

## 2019-04-30 LAB — HM DIABETES EYE EXAM

## 2019-05-08 ENCOUNTER — Telehealth: Payer: Self-pay | Admitting: Pharmacist

## 2019-05-08 DIAGNOSIS — E11628 Type 2 diabetes mellitus with other skin complications: Secondary | ICD-10-CM

## 2019-05-08 MED ORDER — INSULIN LISPRO (1 UNIT DIAL) 100 UNIT/ML (KWIKPEN): 20.0000 [IU] | PEN_INJECTOR | Freq: Three times a day (TID) | SUBCUTANEOUS | 11 refills | Status: DC

## 2019-05-08 MED ORDER — LANTUS SOLOSTAR 100 UNIT/ML ~~LOC~~ SOPN: 40.0000 [IU] | PEN_INJECTOR | Freq: Two times a day (BID) | SUBCUTANEOUS | 11 refills | Status: DC

## 2019-05-08 NOTE — Telephone Encounter (Signed)
Reviewed and agree.

## 2019-05-08 NOTE — Assessment & Plan Note (Signed)
Patient desires no changes to her regimen as she is seeing numbers consistently in the 100-200 range.   I congratulated her on her control and agreed to send updated prescriptions to MAP at [facility]   Current regimen:  Lantus 40 units BID Novolog 15-20 units prior to meals (also used 10 units with "really good numbers").

## 2019-05-08 NOTE — Telephone Encounter (Signed)
Phone call from patient reporting improved blood glucose with recent adjustments in regimen AND improved adherence.   Patient desires no changes to her regimen as she is seeing numbers consistently in the 100-200 range.   I congratulated her on her control and agreed to send updated prescriptions to [facility] at [facility].   Current regimen:  Lantus 40 units BID Novolog 15-20 units prior to meals (also used 10 units with "really good numbers").    Follow-up planned by phone - 2 weeks to assess control.

## 2019-05-14 ENCOUNTER — Other Ambulatory Visit: Payer: Self-pay | Admitting: Family Medicine

## 2019-05-17 ENCOUNTER — Encounter (HOSPITAL_COMMUNITY): Payer: Self-pay | Admitting: Family Medicine

## 2019-05-17 ENCOUNTER — Other Ambulatory Visit: Payer: Self-pay

## 2019-05-17 ENCOUNTER — Telehealth: Payer: Self-pay | Admitting: Family Medicine

## 2019-05-17 ENCOUNTER — Ambulatory Visit (HOSPITAL_COMMUNITY)
Admission: EM | Admit: 2019-05-17 | Discharge: 2019-05-17 | Disposition: A | Discharge status: Home / Self Care | Payer: Self-pay | Attending: Family Medicine | Admitting: Family Medicine

## 2019-05-17 DIAGNOSIS — M722 Plantar fascial fibromatosis: Secondary | ICD-10-CM

## 2019-05-17 MED ORDER — PREDNISONE 20 MG PO TABS: ORAL_TABLET | ORAL | 0 refills | Status: DC

## 2019-05-17 NOTE — Telephone Encounter (Signed)
**  After Hours/ Emergency Line Call**  Received a call from Ranell Patrick.  Endorsing unilateral foot swelling and erythema with a "black spot."  Denies swelling into leg, chest pain, difficulty breathing, fevers. States that she has tried ibuprofen and to keep it elevated, but it is not helping and she is unable to bear weight 2/2 pain. Recommended that patient be seen at Urgent care for further evaluation.  She agreed and stated that she would go.  Red flags discussed.  Will forward to PCP.  Luis Abed, DO PGY-2, [facility] 05/17/2019 9:37 AM

## 2019-05-17 NOTE — ED Triage Notes (Signed)
seen provider only

## 2019-05-17 NOTE — ED Notes (Signed)
Saw patient after discharge

## 2019-05-17 NOTE — ED Provider Notes (Signed)
[place]    CSN: [id number] Arrival date & time: 05/17/19  1042      History   Chief Complaint No chief complaint on file.   HPI [name] [name] is a 44 y.o. female.   This a 44 year old [name] urgent care patient who presents with right foot pain.  She is an insulin dependent diabetic.  She has had this pain for about 5 days.  It is happened to her once before.  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  She has had no fever.  Patient states that the pain is in her arch and radiates up the back of the leg into the thigh.  It is worse with weightbearing.  She has been to the chiropractor to have a manipulation of her hip.  Patient's glucose control is good with numbers in the mid 100s.  Patient notes some associated swelling in the right lower extremity as well.  The swelling is worse at the end of the day but goes away overnight.     Past Medical History:  Diagnosis Date      . Anemia   . Bipolar 1 disorder [facility])    Sees Dr. Pablo Ledger, psychology,  [phone]  . Carpal tunnel syndrome   . Depression   . Diabetes mellitus without complication ([place])   . Family history of adverse reaction to anesthesia    mother & son have difficulty waking  . Fatigue   . Lung nodule seen on imaging study 09/24/2013  . Sleep apnea    uses cpap    Patient Active Problem List   Diagnosis Date Noted    03/27/2019    12/02/2018  .  09/26/2018  .  08/08/2018  . Primary osteoarthritis of left hand 07/26/2018  . Ganglion of left wrist 07/26/2018  . OSA on CPAP 02/09/2016    11/25/2015  . Type 2 diabetes mellitus without complication, with long-term current use of insulin ([place])     07/11/2012  . 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 Vitamin D deficiency 09/20/2007  . MENOPAUSE, SURGICAL 04/24/2007    11/20/2006  . Hyperlipidemia 05/03/2006  . OBESITY, NOS 05/03/2006  . ANEMIA, IRON  DEFICIENCY, UNSPEC. 05/03/2006  . Bipolar I disorder ([place]) 05/03/2006  .  05/03/2006  . HYPERTENSION, BENIGN SYSTEMIC 05/03/2006  .  05/03/2006    Past Surgical History:  Procedure Laterality Date  . ABDOMINAL HYSTERECTOMY    . CHOLECYSTECTOMY N/A 06/22/2017   Procedure: LAPAROSCOPIC CHOLECYSTECTOMY;  Surgeon: Kinsinger, Arta Bruce, MD;  Location: [place];  Service: General;  Laterality: N/A;  . I & D EXTREMITY Right 09/14/2015   Procedure: IRRIGATION AND DEBRIDEMENT RIGHT MIDDLE FINGER;  Surgeon: Roseanne Kaufman, MD;  Location: [place];  Service: Orthopedics;  Laterality: Right;  . TUBAL LIGATION      OB History   No obstetric history on file.      Home Medications    Prior to Admission medications   Medication Sig Start Date End Date Taking? Authorizing Provider  Blood Glucose Monitoring Suppl (ACCU-CHEK AVIVA PLUS) w/Device KIT Use to check blood sugar three times daily or as directed. 03/22/18   Guadalupe [name], MD  glucose blood (ACCU-CHEK AVIVA PLUS) test strip Use as instructed 11/27/18   Guadalupe [name], MD  insulin glargine (LANTUS SOLOSTAR) 100 UNIT/ML Solostar Pen Inject 40 Units into the skin 2 (two) times daily before a meal.  Decrease to 30 units twice daily if blood sugars are less than 100. 05/08/19   Hensel, Jamal Collin, MD  insulin lispro (HUMALOG KWIKPEN) 100 UNIT/ML KwikPen Inject 0.2 mLs (20 Units total) into the skin 3 (three) times daily before meals. 05/08/19   Zenia Resides, MD  lamoTRIgine (LAMICTAL) 100 MG tablet TAKE 2 TABLETS ('200MG'$  TOTAL) BY MOUTH DAILY. 02/11/19   Guadalupe [name], MD  metFORMIN (GLUCOPHAGE-XR) 500 MG 24 hr tablet Take 1 tablet (500 mg total) by mouth at bedtime. 04/08/19   Zenia Resides, MD  predniSONE (DELTASONE) 20 MG tablet one daily with food 05/17/19   Robyn Haber, MD  venlafaxine XR (EFFEXOR-XR) 150 MG 24 hr capsule TAKE 1 CAPSULE (150 MG TOTAL) BY MOUTH DAILY. 05/14/19   Guadalupe [name], MD  gabapentin (NEURONTIN) 300 MG capsule Take 1  capsule (300 mg total) by mouth 3 (three) times daily. 03/22/18 02/25/19  Guadalupe [name], MD    Family History Family History  Problem Relation Age of Onset  . Hypertension Mother   . Diabetes Mellitus II Mother   . Congestive Heart Failure Father   . Hypertension Father   . Diabetes Mellitus II Father     Social History Social History   Tobacco Use  . Smoking status: Never Smoker  . Smokeless tobacco: Never Used  Substance Use Topics  . Alcohol use: Yes    Alcohol/week: 0.0 standard drinks    Comment: occ  . Drug use: No     Allergies   Hydromorphone hcl, Meperidine hcl, Morphine, Lisinopril, Sumatriptan, Tape, and Zolmitriptan   Review of Systems Review of Systems  Musculoskeletal: Positive for gait problem.  All other systems reviewed and are negative.    Physical Exam Triage Vital Signs ED Triage Vitals  Enc Vitals Group     BP      Pulse      Resp      Temp      Temp src      SpO2      Weight      Height      Head Circumference      Peak Flow      Pain Score      Pain Loc      Pain Edu?      Excl. in [place]?    No data found.  Updated Vital Signs BP 114/89 (BP Location: Left Arm)   Pulse 95   Temp 98.7 F (37.1 C) (Oral)   Resp 18   SpO2 97%    Physical Exam Vitals and nursing note reviewed.  Constitutional:      General: She is not in acute distress.    Appearance: Normal appearance. She is obese. She is not ill-appearing.  Eyes:     Conjunctiva/sclera: Conjunctivae normal.  Cardiovascular:     Rate and Rhythm: Normal rate.  Pulmonary:     Effort: Pulmonary effort is normal.  Musculoskeletal:        General: Swelling and tenderness present. No deformity or signs of injury.     Cervical back: Normal range of motion and neck supple.  Skin:    General: Skin is warm and dry.  Neurological:     General: No focal deficit present.     Mental Status: She is alert and oriented to person, place, and time.  Psychiatric:        Mood and  Affect: Mood normal.        Thought Content: Thought content normal.  UC Treatments / Results  Labs (all labs ordered are listed, but only abnormal results are displayed) Labs Reviewed - No data to display   Radiology No results found.    Initial Impression / Assessment and Plan / UC Course  I have reviewed the triage vital signs and the nursing notes.  Pertinent labs & imaging results that were available during my care of the patient were reviewed by me and considered in my medical decision making (see chart for details).    Final Clinical Impressions(s) / UC Diagnoses   Final diagnoses:  Plantar fasciitis of right foot   Discharge Instructions   None    ED Prescriptions    Medication Sig Dispense Auth. Provider   predniSONE (DELTASONE) 20 MG tablet one daily with food 5 tablet Robyn Haber, MD     I have reviewed the PDMP during this encounter.   Robyn Haber, MD 05/17/19 1252

## 2019-06-11 ENCOUNTER — Other Ambulatory Visit: Payer: Self-pay | Admitting: Family Medicine

## 2019-06-25 ENCOUNTER — Telehealth: Payer: Self-pay | Admitting: Pharmacist

## 2019-06-25 MED ORDER — INSULIN LISPRO (1 UNIT DIAL) 100 UNIT/ML (KWIKPEN): 20.0000 [IU] | PEN_INJECTOR | Freq: Three times a day (TID) | SUBCUTANEOUS | 11 refills | Status: DC

## 2019-06-25 NOTE — Telephone Encounter (Signed)
Noted and agree. 

## 2019-06-25 NOTE — Telephone Encounter (Signed)
Call from patient requesting new supply of medication from [facility]. Med Assistance Program.  She states she is down to her last dose of Humalog.    She has been taking 30 units (not 20 units as prescribed) recently due to her blood sugars being higher with "pain".  Her blood sugars were reported to be ~ 150.   New prescription sent to pharmacy and followed up with call to [place] [facility].   They have a supply for her to pick-up.   I returned her call and we agreed to continue the 20 units on the prescription of Humalog AND continue Lantus 40 units BID as prescribed.   She was asked to call our office if she continues to required higher dose (30 units of Humalog prior to meals) to control blood sugars.   I

## 2019-07-10 ENCOUNTER — Other Ambulatory Visit: Payer: Self-pay | Admitting: Family Medicine

## 2019-07-22 ENCOUNTER — Telehealth: Payer: Self-pay | Admitting: Pharmacist

## 2019-07-22 NOTE — Telephone Encounter (Signed)
Patient calls and reports she is low on insulin supply.   She requests samples of Humalog insulin.  Medication Samples have been provided to the patient.  Drug name: Humalog Kwikpen       Strength: 100units/ml        Qty: 2 pens  LOT: [id number]  Exp.Date: 10/03/2020  Dosing instructions: 20 units prior to meals  The patient has been instructed regarding the correct time, dose, and frequency of taking this medication, including desired effects and most common side effects.    Patient will come to pick-up soon - left at front desk for pick-up.

## 2019-08-07 ENCOUNTER — Other Ambulatory Visit: Payer: Self-pay | Admitting: Family Medicine

## 2019-08-19 ENCOUNTER — Telehealth: Payer: Self-pay | Admitting: Pharmacist

## 2019-08-19 NOTE — Telephone Encounter (Signed)
Samples given to patient  

## 2019-08-19 NOTE — Telephone Encounter (Signed)
Patient called and requested samples of insulin as she has increased her dose AND is using up her insulin faster than the manufacturer supply.   She left a message requesting return call.   6/15 returned call.  Patient clarified that she has run out her insulin and has been using more, however her readings are in the 200 ans 300 range.   Medication Samples have been provided to the patient.  Drug name: Fiasp (insulin aspart)       Strength: 100units/ml        Qty: 4 pens  LOT: [id number]  Exp.Date: 06/03/2021  Dosing instructions: Same as Humalog (20 units prior to meals)   Medication Samples have been provided to the patient.  Drug name: Toujeo (insulin glarigine)       Strength: 300units/ml        Qty: 4 pens  LOT: [id number]  Exp.Date: 03/05/2021  Dosing instructions: take 40 units twice daily (same as lantus)  The patient has been instructed regarding the correct time, dose, and frequency of taking this medication, including desired effects and most common side effects.   [name] 12:23 PM 08/19/2019  Encouraged follow-up with PCP Agreed to send new updated prescription for rapid insulin to health department managed indigent care program (MAP).

## 2019-09-09 ENCOUNTER — Telehealth: Payer: Self-pay | Admitting: Pharmacist

## 2019-09-09 NOTE — Telephone Encounter (Signed)
Patient called and requested call back RE "omnipod" technology for her insulin.   I returned call and we discussed how pod technology will be linked to CGM (continous glucose monitoring) with the next version of the [facility] AND Dexcom devices.   She was willing to consider up to $75 per month (currently paying ~ $11-12 per month at the [facility].)   We discussed how this CGM technology is likely to work with the pod technology in the near future (next version).    She was provided a number to inquire about [facility] Igo for cash paying patients.  If she is able to obtain device and supplies, she will then schedule Rx clinic visit for set-up.

## 2019-09-29 ENCOUNTER — Telehealth: Payer: Self-pay | Admitting: Pharmacist

## 2019-09-30 ENCOUNTER — Other Ambulatory Visit: Payer: Self-pay

## 2019-09-30 MED ORDER — INSULIN LISPRO (1 UNIT DIAL) 100 UNIT/ML (KWIKPEN): 60.0000 [IU] | PEN_INJECTOR | Freq: Two times a day (BID) | SUBCUTANEOUS | 11 refills | Status: DC

## 2019-09-30 NOTE — Telephone Encounter (Signed)
Noted and agree. 

## 2019-09-30 NOTE — Telephone Encounter (Signed)
Patient called and left voice mail requesting that I call and clarify Humalog dosing with [facility].   I called her back and clarified that her doses of Humalog are highly variable.  She is attempting to adjust doses of Humalog to her blood sugars and carbohydrate in her meals.  She has been written for 20 units TID.   She reports that she is only eating 1-2 larger meals per day. She also reported that she has taken up to 60 units with large meals without any symptoms of low blood sugar.    We agreed that dosing her meal time insulin should continue to be based on a sliding scale and that dose of up to 60 units could be appropriate given her past needs to control her blood sugar.  We agreed that Humalog doses up to 60 units were OK on her sliding scale.  She agreed that a new prescription for Humalog 60 units BID was appropriate and should cover her needs.    I called [place] [facility] and provided new prescription for this higher dose by phone.  I encourage patient to follow-up soon with new PCP.  [facility] requested a refill for Lamotrigine.  I deferred this request which was faxed to our office to the PCP.

## 2019-10-02 MED ORDER — LAMOTRIGINE 100 MG PO TABS: ORAL_TABLET | ORAL | 0 refills | Status: DC

## 2019-10-08 ENCOUNTER — Other Ambulatory Visit: Payer: Self-pay | Admitting: Family Medicine

## 2019-10-27 ENCOUNTER — Encounter: Payer: Self-pay | Admitting: Emergency Medicine

## 2019-10-27 ENCOUNTER — Other Ambulatory Visit: Payer: Self-pay

## 2019-10-27 ENCOUNTER — Ambulatory Visit
Admission: EM | Admit: 2019-10-27 | Discharge: 2019-10-27 | Disposition: A | Discharge status: Home / Self Care | Payer: Self-pay | Attending: Emergency Medicine | Admitting: Emergency Medicine

## 2019-10-27 ENCOUNTER — Ambulatory Visit (INDEPENDENT_AMBULATORY_CARE_PROVIDER_SITE_OTHER): Payer: Self-pay

## 2019-10-27 ENCOUNTER — Telehealth: Payer: Self-pay

## 2019-10-27 DIAGNOSIS — R5383 Other fatigue: Secondary | ICD-10-CM

## 2019-10-27 DIAGNOSIS — R0602 Shortness of breath: Secondary | ICD-10-CM

## 2019-10-27 DIAGNOSIS — R0600 Dyspnea, unspecified: Secondary | ICD-10-CM

## 2019-10-27 DIAGNOSIS — R079 Chest pain, unspecified: Secondary | ICD-10-CM

## 2019-10-27 DIAGNOSIS — R0609 Other forms of dyspnea: Secondary | ICD-10-CM

## 2019-10-27 DIAGNOSIS — R002 Palpitations: Secondary | ICD-10-CM

## 2019-10-27 NOTE — ED Triage Notes (Signed)
Pt sts palpitations intermittently x 2 weeks; pt sts some tightness in chest and SOB at times

## 2019-10-27 NOTE — ED Provider Notes (Signed)
[facility]    CSN: [id number] Arrival date & time: 10/27/19  1836      History   Chief Complaint Chief Complaint  Patient presents with  . Palpitations    HPI [name] [name] is a 44 y.o. female is in for 2-week course of worsening dyspnea with exertion, palpitations.  States that sometimes she will feel lightheaded or dizzy with it occurs.  Tried scheduling appoint with her PCP, though did not have availability: Recommended UC/ER for further evaluation.  Patient denying active chest pain at this time.  No nausea, vomiting, hyperglycemia during these episodes.  Patient is able to check her sugars at home.  Reports compliance of medications as outlined below.  Denies lower leg swelling, shortness of breath or fever, cough.    Past Medical History:  Diagnosis Date  . Allergy   . Anemia   . Bipolar 1 disorder [facility])    Sees Dr. Pablo Ledger, psychology,  [phone]  . Carpal tunnel syndrome   . Depression   . Diabetes mellitus without complication ([place])   . Family history of adverse reaction to anesthesia    mother & son have difficulty waking  . Fatigue   . Lung nodule seen on imaging study 09/24/2013  . Sleep apnea    uses cpap    Patient Active Problem List   Diagnosis Date Noted  . Close exposure to COVID-19 virus 03/27/2019  . Localized swelling of lower extremity, bilateral 12/02/2018  . Trichomonas infection 09/26/2018  . Urine frequency 08/08/2018  . Primary osteoarthritis of left hand 07/26/2018  . Ganglion of left wrist 07/26/2018  . OSA on CPAP 02/09/2016  . Diabetes mellitus with hyperglycemia ([place]) 11/25/2015  . Type 2 diabetes mellitus without complication, with long-term current use of insulin ([place])   . Diabetes mellitus ([place]) 07/11/2012  . 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 Vitamin D deficiency 09/20/2007  . MENOPAUSE, SURGICAL 04/24/2007  . Diaphragmatic hernia 11/20/2006  . Hyperlipidemia 05/03/2006  . OBESITY, NOS 05/03/2006  . ANEMIA,  IRON DEFICIENCY, UNSPEC. 05/03/2006  . Bipolar I disorder ([place]) 05/03/2006  . ANXIETY 05/03/2006  . HYPERTENSION, BENIGN SYSTEMIC 05/03/2006  . PEPTIC ULCER DIS., UNSPEC. W/O OBSTRUCTION 05/03/2006    Past Surgical History:  Procedure Laterality Date  . ABDOMINAL HYSTERECTOMY    . CHOLECYSTECTOMY N/A 06/22/2017   Procedure: LAPAROSCOPIC CHOLECYSTECTOMY;  Surgeon: Kinsinger, Arta Bruce, MD;  Location: [facility];  Service: General;  Laterality: N/A;  . I & D EXTREMITY Right 09/14/2015   Procedure: IRRIGATION AND DEBRIDEMENT RIGHT MIDDLE FINGER;  Surgeon: Roseanne Kaufman, MD;  Location: [facility];  Service: Orthopedics;  Laterality: Right;  . TUBAL LIGATION      OB History   No obstetric history on file.      Home Medications    Prior to Admission medications   Medication Sig Start Date End Date Taking? Authorizing Provider  Blood Glucose Monitoring Suppl (ACCU-CHEK AVIVA PLUS) w/Device KIT Use to check blood sugar three times daily or as directed. 03/22/18   Guadalupe [name], MD  glucose blood (ACCU-CHEK AVIVA PLUS) test strip Use as instructed 11/27/18   Guadalupe [name], MD  insulin glargine (LANTUS SOLOSTAR) 100 UNIT/ML Solostar Pen Inject 40 Units into the skin 2 (two) times daily before a meal. Decrease to 30 units twice daily if blood sugars are less than 100. 05/08/19   Hensel, Jamal Collin, MD  insulin lispro (HUMALOG KWIKPEN) 100 UNIT/ML KwikPen Inject 0.6 mLs (60 Units total) into the skin 2 (two) times  daily before a meal. Using Sliding Scale. 09/30/19   Leavy Cella, RPH-CPP  lamoTRIgine (LAMICTAL) 100 MG tablet TAKE 2 TABLETS ($RemoveBe'200MG'akPVBtckx$  TOTAL) BY MOUTH DAILY. 10/09/19   Shary Key, DO  metFORMIN (GLUCOPHAGE-XR) 500 MG 24 hr tablet Take 1 tablet (500 mg total) by mouth at bedtime. 04/08/19   Zenia Resides, MD  predniSONE (DELTASONE) 20 MG tablet one daily with food 05/17/19   Robyn Haber, MD  venlafaxine XR (EFFEXOR-XR) 150 MG 24 hr capsule TAKE 1 CAPSULE (150 MG TOTAL) BY MOUTH  DAILY. 05/14/19   Guadalupe [name], MD  gabapentin (NEURONTIN) 300 MG capsule Take 1 capsule (300 mg total) by mouth 3 (three) times daily. 03/22/18 02/25/19  Guadalupe [name], MD    Family History Family History  Problem Relation Age of Onset  . Hypertension Mother   . Diabetes Mellitus II Mother   . Congestive Heart Failure Father   . Hypertension Father   . Diabetes Mellitus II Father     Social History Social History   Tobacco Use  . Smoking status: Never Smoker  . Smokeless tobacco: Never Used  Substance Use Topics  . Alcohol use: Yes    Alcohol/week: 0.0 standard drinks    Comment: occ  . Drug use: No     Allergies   Hydromorphone hcl, Meperidine hcl, Morphine, Lisinopril, Sumatriptan, Tape, and Zolmitriptan   Review of Systems As per HPI   Physical Exam Triage Vital Signs ED Triage Vitals  Enc Vitals Group     BP 10/27/19 1911 (!) 184/108     Pulse Rate 10/27/19 1911 93     Resp 10/27/19 1911 (!) 24     Temp 10/27/19 1911 98.7 F (37.1 C)     Temp Source 10/27/19 1911 Oral     SpO2 10/27/19 1911 96 %     Weight --      Height --      Head Circumference --      Peak Flow --      Pain Score 10/27/19 1912 3     Pain Loc --      Pain Edu? --      Excl. in [place]? --    No data found.  Updated Vital Signs BP (!) 146/90 (BP Location: Left Arm)   Pulse 93   Temp 98.7 F (37.1 C) (Oral)   Resp (!) 24   SpO2 96%   Visual Acuity Right Eye Distance:   Left Eye Distance:   Bilateral Distance:    Right Eye Near:   Left Eye Near:    Bilateral Near:     Physical Exam Vitals reviewed.  Constitutional:      General: She is not in acute distress.    Appearance: She is normal weight. She is not ill-appearing.  HENT:     Head: Normocephalic and atraumatic.  Eyes:     General: No scleral icterus.       Right eye: No discharge.        Left eye: No discharge.     Extraocular Movements: Extraocular movements intact.     Pupils: Pupils are equal, round,  and reactive to light.  Cardiovascular:     Rate and Rhythm: Normal rate and regular rhythm.     Pulses: Normal pulses.     Heart sounds: No murmur heard.   Pulmonary:     Effort: Pulmonary effort is normal. No respiratory distress.     Breath sounds: No wheezing.  Chest:  Chest wall: No tenderness.  Abdominal:     General: Abdomen is flat.     Palpations: Abdomen is soft.     Tenderness: There is no abdominal tenderness. There is no guarding.  Musculoskeletal:     Cervical back: Normal range of motion and neck supple. No muscular tenderness.  Lymphadenopathy:     Cervical: No cervical adenopathy.  Skin:    General: Skin is warm.     Capillary Refill: Capillary refill takes less than 2 seconds.     Coloration: Skin is not jaundiced or pale.     Findings: No rash.  Neurological:     General: No focal deficit present.     Mental Status: She is alert and oriented to person, place, and time.  Psychiatric:        Mood and Affect: Mood normal.        Thought Content: Thought content normal.      UC Treatments / Results  Labs (all labs ordered are listed, but only abnormal results are displayed) Labs Reviewed  COMPREHENSIVE METABOLIC PANEL - Abnormal; Notable for the following components:      Result Value   Glucose 144 (*)    All other components within normal limits   Narrative:    Performed at:  [address], [place], [place]  [id number] Lab Director: Rush Farmer MD, Phone:  [phone]  NOVEL CORONAVIRUS, NAA  CBC   Narrative:    Performed at:  [address], [place], [place]  [id number] Lab Director: Rush Farmer MD, Phone:  [phone]  TSH   Narrative:    Performed at:  [address] [place], [place]  [id number] Lab Director: Rush Farmer MD, Phone:  [phone]    EKG   Radiology DG Chest 2 View  Result Date: 10/27/2019 CLINICAL DATA:  Shortness of breath, chest pain  EXAM: CHEST - 2 VIEW COMPARISON:  06/21/2017 FINDINGS: Heart and mediastinal contours are within normal limits. No focal opacities or effusions. No acute bony abnormality. IMPRESSION: No active cardiopulmonary disease. Electronically Signed   By: Rolm Baptise M.D.   On: 10/27/2019 20:02    Procedures Procedures (including critical care time)  Medications Ordered in UC Medications - No data to display  Initial Impression / Assessment and Plan / UC Course  I have reviewed the triage vital signs and the nursing notes.  Pertinent labs & imaging results that were available during my care of the patient were reviewed by me and considered in my medical decision making (see chart for details).     Patient afebrile, nontoxic in office today.  Initially tachycardic, tachypneic which resolved with rest.  Patient is hypertensive.  Trending towards normal at time of discharge.  EKG done in office, reviewed by me compared to previous from 06/22/2017: NSR with ventricular at 93 bpm.  No QTC prolongation, ST elevation or depression.  Waveforms stable in all leads: Nonacute EKG. discussed patient's comorbidities/risk for cardiac etiology and possible further work-up in ER setting if symptoms persist/worsen.  Patient wanting to avoid ER due to Covid and we times.  Will obtain basic labs, have patient keep follow-up with PCP later this week.  ER return precautions discussed, pt verbalized understanding and is agreeable to plan. Final Clinical Impressions(s) / UC Diagnoses   Final diagnoses:  Palpitations  Chest pain, unspecified type  Dyspnea on exertion  Other fatigue     Discharge Instructions  Chest Xray normal. Blood work pending: Public affairs consultant for results. Keep track of symptoms and keep follow up appointment with PCP on Thursday. Go to ER in the mean time if you have ANY worsening symptoms!    ED Prescriptions    None     PDMP not reviewed this encounter.   [place], [place],  [place] 10/28/19 [phone]

## 2019-10-27 NOTE — Discharge Instructions (Addendum)
Chest Xray normal. Blood work pending: Designer, industrial/product for results. Keep track of symptoms and keep follow up appointment with PCP on Thursday. Go to ER in the mean time if you have ANY worsening symptoms!

## 2019-10-27 NOTE — Telephone Encounter (Signed)
Patient calls nurse line regarding extreme fatigue and intermittent heart palpitations occurring for the last two weeks. Patient does also report chest tightness when she is having the palpitations. Patient is wanting to schedule appointment for Thursday afternoon. Advised patient that UC or ED evaluation may be necessary due to symptoms. Patient declined at this time. Strict ED/return precautions given. Scheduled ATC for Thursday afternoon.    To PCP  Veronda Prude, RN

## 2019-10-28 ENCOUNTER — Encounter: Payer: Self-pay | Admitting: Emergency Medicine

## 2019-10-28 LAB — COMPREHENSIVE METABOLIC PANEL
ALT: 11 IU/L (ref 0–32)
AST: 12 IU/L (ref 0–40)
Albumin/Globulin Ratio: 1.5 (ref 1.2–2.2)
Albumin: 4.3 g/dL (ref 3.8–4.8)
Alkaline Phosphatase: 103 IU/L (ref 48–121)
BUN/Creatinine Ratio: 15 (ref 9–23)
BUN: 12 mg/dL (ref 6–24)
Bilirubin Total: <0.2 mg/dL (ref 0.0–1.2)
CO2: 25 mmol/L (ref 20–29)
Calcium: 9.7 mg/dL (ref 8.7–10.2)
Chloride: 105 mmol/L (ref 96–106)
Creatinine, Ser: 0.79 mg/dL (ref 0.57–1.00)
GFR calc Af Amer: 105 mL/min/{1.73_m2} (ref >59)
GFR calc non Af Amer: 91 mL/min/{1.73_m2} (ref >59)
Globulin, Total: 2.9 g/dL (ref 1.5–4.5)
Glucose: 144 mg/dL — ABNORMAL HIGH (ref 65–99)
Potassium: 4.2 mmol/L (ref 3.5–5.2)
Sodium: 143 mmol/L (ref 134–144)
Total Protein: 7.2 g/dL (ref 6.0–8.5)

## 2019-10-28 LAB — CBC
Hematocrit: 38.4 % (ref 34.0–46.6)
Hemoglobin: 13.1 g/dL (ref 11.1–15.9)
MCH: 28.9 pg (ref 26.6–33.0)
MCHC: 34.1 g/dL (ref 31.5–35.7)
MCV: 85 fL (ref 79–97)
Platelets: 286 10*3/uL (ref 150–450)
RBC: 4.53 x10E6/uL (ref 3.77–5.28)
RDW: 13.2 % (ref 11.7–15.4)
WBC: 8.3 10*3/uL (ref 3.4–10.8)

## 2019-10-28 LAB — TSH: TSH: 3.83 u[IU]/mL (ref 0.450–4.500)

## 2019-10-28 NOTE — Telephone Encounter (Signed)
Thanks for letting me know!

## 2019-10-29 LAB — NOVEL CORONAVIRUS, NAA: SARS-CoV-2, NAA: NOT DETECTED

## 2019-10-29 LAB — SARS-COV-2, NAA 2 DAY TAT

## 2019-10-30 ENCOUNTER — Ambulatory Visit: Payer: Self-pay

## 2019-11-04 ENCOUNTER — Other Ambulatory Visit: Payer: Self-pay | Admitting: Family Medicine

## 2019-11-05 ENCOUNTER — Other Ambulatory Visit: Payer: Self-pay

## 2019-11-05 DIAGNOSIS — Z20822 Contact with and (suspected) exposure to covid-19: Secondary | ICD-10-CM

## 2019-11-06 LAB — NOVEL CORONAVIRUS, NAA: SARS-CoV-2, NAA: DETECTED — AB

## 2019-11-07 ENCOUNTER — Telehealth: Payer: Self-pay | Admitting: Adult Health

## 2019-11-07 ENCOUNTER — Other Ambulatory Visit: Payer: Self-pay | Admitting: Nurse Practitioner

## 2019-11-07 DIAGNOSIS — E669 Obesity, unspecified: Secondary | ICD-10-CM

## 2019-11-07 DIAGNOSIS — I10 Essential (primary) hypertension: Secondary | ICD-10-CM

## 2019-11-07 DIAGNOSIS — E11628 Type 2 diabetes mellitus with other skin complications: Secondary | ICD-10-CM

## 2019-11-07 DIAGNOSIS — U071 COVID-19: Secondary | ICD-10-CM

## 2019-11-07 NOTE — Telephone Encounter (Signed)
Called patient regarding monoclonal antibody treatment for COVID 19 given to those who are at risk for complications and/or hospitalization of the virus.  Patient meets criteria based on: BMI greater than 25, diabetes mellitus, and HTN.  She would like to do some research on the treatment and will call back if interested.    Call back number given: [phone]   Lillard Anes, NP

## 2019-11-07 NOTE — Progress Notes (Signed)
I connected by phone with [name] [name] on 11/07/2019 at 4:04 PM to discuss the potential use of a new treatment for mild to moderate COVID-19 viral infection in non-hospitalized patients.  This patient is a 44 y.o. female that meets the FDA criteria for Emergency Use Authorization of COVID monoclonal antibody casirivimab/imdevimab.  Has a (+) direct SARS-CoV-2 viral test result  Has mild or moderate COVID-19   Is NOT hospitalized due to COVID-19  Is within 10 days of symptom onset  Has at least one of the high risk factor(s) for progression to severe COVID-19 and/or hospitalization as defined in EUA.  Specific high risk criteria : BMI > 25, Diabetes and Cardiovascular disease or hypertension   I have spoken and communicated the following to the patient or parent/caregiver regarding COVID monoclonal antibody treatment:  1. FDA has authorized the emergency use for the treatment of mild to moderate COVID-19 in adults and pediatric patients with positive results of direct SARS-CoV-2 viral testing who are 70 years of age and older weighing at least 40 kg, and who are at high risk for progressing to severe COVID-19 and/or hospitalization.  2. The significant known and potential risks and benefits of COVID monoclonal antibody, and the extent to which such potential risks and benefits are unknown.  3. Information on available alternative treatments and the risks and benefits of those alternatives, including clinical trials.  4. Patients treated with COVID monoclonal antibody should continue to self-isolate and use infection control measures (e.g., wear mask, isolate, social distance, avoid sharing personal items, clean and disinfect high touch surfaces, and frequent handwashing) according to CDC guidelines.   5. The patient or parent/caregiver has the option to accept or refuse COVID monoclonal antibody treatment.  After reviewing this information with the patient, The patient agreed to  proceed with receiving casirivimab\imdevimab infusion and will be provided a copy of the Fact sheet prior to receiving the infusion.  Sx onset 11/04/19. Set up for infusion on 11/08/19 14:00. Directions given to [name]. Pt is aware that insurance will be charged an infusion fee.   Sherril Cong 11/07/2019 4:04 PM

## 2019-11-08 ENCOUNTER — Ambulatory Visit (HOSPITAL_COMMUNITY)
Admission: RE | Admit: 2019-11-08 | Discharge: 2019-11-08 | Disposition: A | Discharge status: Home / Self Care | Payer: HRSA Program | Source: Ambulatory Visit | Attending: Pulmonary Disease | Admitting: Pulmonary Disease

## 2019-11-08 DIAGNOSIS — U071 COVID-19: Secondary | ICD-10-CM

## 2019-11-08 DIAGNOSIS — I10 Essential (primary) hypertension: Secondary | ICD-10-CM

## 2019-11-08 DIAGNOSIS — E11628 Type 2 diabetes mellitus with other skin complications: Secondary | ICD-10-CM | POA: Insufficient documentation

## 2019-11-08 DIAGNOSIS — E669 Obesity, unspecified: Secondary | ICD-10-CM | POA: Diagnosis present

## 2019-11-08 DIAGNOSIS — Z794 Long term (current) use of insulin: Secondary | ICD-10-CM | POA: Insufficient documentation

## 2019-11-08 MED ORDER — ACETAMINOPHEN 325 MG PO TABS
650.0000 mg | ORAL_TABLET | Freq: Once | ORAL | Status: AC
  Administered 2019-11-08: 650 mg via ORAL
  Filled 2019-11-08: qty 2

## 2019-11-08 MED ORDER — METHYLPREDNISOLONE SODIUM SUCC 125 MG IJ SOLR: 125.0000 mg | Freq: Once | INTRAMUSCULAR | Status: DC | PRN

## 2019-11-08 MED ORDER — ALBUTEROL SULFATE HFA 108 (90 BASE) MCG/ACT IN AERS: 2.0000 | INHALATION_SPRAY | Freq: Once | RESPIRATORY_TRACT | Status: DC | PRN

## 2019-11-08 MED ORDER — SODIUM CHLORIDE 0.9 % IV SOLN: INTRAVENOUS | Status: DC | PRN

## 2019-11-08 MED ORDER — FAMOTIDINE IN NACL 20-0.9 MG/50ML-% IV SOLN: 20.0000 mg | Freq: Once | INTRAVENOUS | Status: DC | PRN

## 2019-11-08 MED ORDER — DIPHENHYDRAMINE HCL 50 MG/ML IJ SOLN: 50.0000 mg | Freq: Once | INTRAMUSCULAR | Status: DC | PRN

## 2019-11-08 MED ORDER — SODIUM CHLORIDE 0.9 % IV SOLN
1200.0000 mg | Freq: Once | INTRAVENOUS | Status: AC
  Administered 2019-11-08: 1200 mg via INTRAVENOUS
  Filled 2019-11-08: qty 10

## 2019-11-08 MED ORDER — EPINEPHRINE 0.3 MG/0.3ML IJ SOAJ: 0.3000 mg | Freq: Once | INTRAMUSCULAR | Status: DC | PRN

## 2019-11-08 NOTE — Progress Notes (Signed)
  Diagnosis: COVID-19  Physician: Isaiah Serge MD  Procedure: [facility] Infusion [facility] Med: casirivimab\imdevimab infusion - Provided patient with casirivimab\imdevimab fact sheet for patients, parents and caregivers prior to infusion.  Complications: fever of 101, given tylenol  Discharge: Discharged home   [name] [name] 11/08/2019

## 2019-11-08 NOTE — Discharge Instructions (Signed)

## 2019-11-11 ENCOUNTER — Telehealth: Payer: Self-pay

## 2019-11-11 NOTE — Telephone Encounter (Signed)
Patient calls nurse line reporting positive covid result on 9/3. Patient reports she went to get tested on 9/1 due to cough and congestion. Patient states the cough and congestion have subsided, however it "hurts really bad" to breathe in and out. Patient does have a pulse ox and reports her o2 sats are stating between 94%-96%. Patient does reports some nausea, however no other symptoms. Patient is wanting to know if there is anything to take for the pain or if something can be called in for nausea. Patient advised to rest and to stay well hydrated and continue checking pulse ox. ED precautions given. Please advise.

## 2019-11-11 NOTE — Telephone Encounter (Signed)
She can take ibuprofen 200-400mg  every 6 hours as needed for her pain. I can prescribe Zofran for her nausea it looks like she has tolerated that medication in the past. She can take 1 tablet (4mg ) every 6 hours as needed for her nausea.   Thank you!

## 2019-12-09 ENCOUNTER — Other Ambulatory Visit: Payer: Self-pay | Admitting: Family Medicine

## 2019-12-11 ENCOUNTER — Other Ambulatory Visit (HOSPITAL_COMMUNITY): Payer: Self-pay | Admitting: Emergency Medicine

## 2020-01-08 ENCOUNTER — Other Ambulatory Visit: Payer: Self-pay | Admitting: Family Medicine

## 2020-02-17 ENCOUNTER — Other Ambulatory Visit: Payer: Self-pay | Admitting: Family Medicine

## 2020-03-31 ENCOUNTER — Other Ambulatory Visit: Payer: Self-pay | Admitting: Family Medicine

## 2020-03-31 DIAGNOSIS — E11628 Type 2 diabetes mellitus with other skin complications: Secondary | ICD-10-CM

## 2020-04-27 ENCOUNTER — Other Ambulatory Visit: Payer: Self-pay | Admitting: Family Medicine

## 2020-04-27 DIAGNOSIS — Z794 Long term (current) use of insulin: Secondary | ICD-10-CM

## 2020-04-27 DIAGNOSIS — E11628 Type 2 diabetes mellitus with other skin complications: Secondary | ICD-10-CM

## 2020-05-03 ENCOUNTER — Other Ambulatory Visit: Payer: Self-pay | Admitting: Family Medicine

## 2020-05-03 DIAGNOSIS — Z794 Long term (current) use of insulin: Secondary | ICD-10-CM

## 2020-05-03 DIAGNOSIS — E11628 Type 2 diabetes mellitus with other skin complications: Secondary | ICD-10-CM

## 2020-05-17 ENCOUNTER — Other Ambulatory Visit: Payer: Self-pay | Admitting: Family Medicine

## 2020-05-19 ENCOUNTER — Other Ambulatory Visit: Payer: Self-pay | Admitting: Family Medicine

## 2020-06-03 ENCOUNTER — Ambulatory Visit (INDEPENDENT_AMBULATORY_CARE_PROVIDER_SITE_OTHER): Payer: Self-pay | Admitting: Family Medicine

## 2020-06-03 ENCOUNTER — Other Ambulatory Visit: Payer: Self-pay

## 2020-06-03 VITALS — BP 110/68 | HR 95 | Temp 98.6°F

## 2020-06-03 DIAGNOSIS — J029 Acute pharyngitis, unspecified: Secondary | ICD-10-CM

## 2020-06-03 DIAGNOSIS — H6690 Otitis media, unspecified, unspecified ear: Secondary | ICD-10-CM

## 2020-06-03 MED ORDER — AMOXICILLIN 875 MG PO TABS: 875.0000 mg | ORAL_TABLET | Freq: Two times a day (BID) | ORAL | 0 refills | Status: AC

## 2020-06-03 MED ORDER — CETIRIZINE HCL 10 MG PO TABS: 10.0000 mg | ORAL_TABLET | Freq: Every day | ORAL | 11 refills | Status: DC

## 2020-06-03 MED ORDER — FLUCONAZOLE 150 MG PO TABS: 150.0000 mg | ORAL_TABLET | Freq: Once | ORAL | 0 refills | Status: AC

## 2020-06-03 NOTE — Patient Instructions (Signed)
It was nice to meet you today  For your earache, I will prescribe 7 days of amoxicillin for acute otitis media.  I would also like you to take Zyrtec every day during this time.  I have prescribed you 1 dose of fluconazole to use at the end of this if you develop any vaginal itching burning or discharge.  Please schedule a follow-up appoint with [place] in 2 weeks if this is not improved.  Have a great day,  Frederic Jericho, MD

## 2020-06-03 NOTE — Assessment & Plan Note (Signed)
2 days of right ear pain.  Right tympanic membrane appeared erythematous with effusion.  Could be viral related to her other URI symptoms, but will treat given the patient's pain and TM findings.  Amoxicillin 875 x 7 days twice daily

## 2020-06-03 NOTE — Assessment & Plan Note (Signed)
3 weeks of cough and sore throat with rhinorrhea and nasal congestion.  Throat tender to palpation but no swelling and no difficulty swallowing.  Likely viral, but patient also has history of allergies in her allergies may be why her symptoms are so prolonged.  Advised her to take Zyrtec once a day while she is taking her amoxicillin for her ear infection.  Patient refuses to take intranasal corticosteroids.  Advised to follow-up in 2 weeks if not improved.

## 2020-06-03 NOTE — Progress Notes (Signed)
    SUBJECTIVE:   CHIEF COMPLAINT / HPI:  Patient states that she has had coughing, sore throat, runny nose and congestion for the past 3 weeks.  Also endorses losing her voice.  States that first she developed a cough then sore throat.  Episodes of losing her voice usually occur later in the day and resolved by the next morning.  She has a dust mite allergy and recently cleaned out her house a week ago but symptoms predated this.  She takes Zyrtec before doing any cleaning that may trigger her allergies.  No history of reflux.  Over the past 2 days feels like her right ear is hurting.  Feels like it is stopped up as well.  She does not take intranasal steroid sprays because she cannot tolerate them.  She denies any fever, N/V/D.  PERTINENT  PMH / PSH: Allergies  OBJECTIVE:   BP 110/68   Pulse 95   Temp 98.6 F (37 C) (Axillary)   SpO2 96%   General: Alert, oriented.  No acute distress HEENT: Nasal turbinates slightly erythematous but not overly enlarged.  No excess mucus in the nasal cavities noted.  Normal oropharynx.  Left tympanic membrane normal.  Right ear canal filled with wax, this was removed with irrigation.  Tympanic membrane then visualized and appears to be erythematous with likely effusion.  Line CV: Regular rate and rhythm, Pulmonary: Lungs clear auscultation bilaterally  ASSESSMENT/PLAN:   Acute otitis media 2 days of right ear pain.  Right tympanic membrane appeared erythematous with effusion.  Could be viral related to her other URI symptoms, but will treat given the patient's pain and TM findings.  Amoxicillin 875 x 7 days twice daily  Pharyngitis 3 weeks of cough and sore throat with rhinorrhea and nasal congestion.  Throat tender to palpation but no swelling and no difficulty swallowing.  Likely viral, but patient also has history of allergies in her allergies may be why her symptoms are so prolonged.  Advised her to take Zyrtec once a day while she is taking her  amoxicillin for her ear infection.  Patient refuses to take intranasal corticosteroids.  Advised to follow-up in 2 weeks if not improved.     [name] Kitty, MD [facility]

## 2020-06-04 ENCOUNTER — Telehealth: Payer: Self-pay

## 2020-06-04 NOTE — Telephone Encounter (Signed)
Called patient to discuss further. No answer, left HIPAA compliant VM for patient to return call.   Veronda Prude, RN

## 2020-06-04 NOTE — Telephone Encounter (Signed)
It is about $10 at most pharmacies if she uses goodrx.  If she can get it for less than that at the health department I can write her a different prescription if she wants.

## 2020-06-04 NOTE — Telephone Encounter (Signed)
Pharmacy calls nurse line regarding issues with amoxicillin rx. [facility] pharmacy does not have 875 mg amoxicillin. They only carry the 500 mg tablets. Please advise if another alternative could be sent in, or if pharmacy should give rx back to patient to have filled at alternate pharmacy.   Veronda Prude, RN

## 2020-06-15 ENCOUNTER — Other Ambulatory Visit (HOSPITAL_COMMUNITY): Payer: Self-pay

## 2020-06-15 MED FILL — Venlafaxine HCl Cap ER 24HR 150 MG (Base Equivalent): ORAL | 30 days supply | Qty: 30 | Fill #0 | Status: AC

## 2020-06-16 NOTE — Progress Notes (Signed)
    SUBJECTIVE:   CHIEF COMPLAINT / HPI:   [name] [name] is a 45 yo who presents to meet PCP. Also wants to discuss mucus buildup in the back of her throat, and follow-up on right ear. Reports mucus build up in back of throat that have increased in the past couple of weeks. Does endorse recent infection at the end of last month for which she experienced 3 weeks of sore throat, cough, runny nose, and congestion.  Around the same time she complained of right ear pain, and was diagnosed with an ear infection and was prescribed amoxicillin 875 for 7 days twice daily.  Also endorses allergies for whichs she takes Zyrtec. Denies cough, fever, SOB.   DM2: Last a1c 8.3 on 11/2018. Reports fasting sugars 165 or over and feels that her sugars have been higher the last few months. Denies episodes of hypoglycemia.Takes metformin 500 daily and Lantus 42 BID    PERTINENT  PMH / PSH:  Type 2 diabetes, hypertension, hyperlipidemia, OSA on CPAP, bipolar 1, anxiety  OBJECTIVE:   BP 114/70   Pulse 95   Ht 5\' 8"  (1.727 m)   Wt (!) 314 lb 2 oz (142.5 kg)   SpO2 99%   BMI 47.76 kg/m    Physical exam  General: well appearing, NAD HEENT: Tympanic membranes clear bilaterally Cardiovascular: RRR, no murmurs Lungs: CTAB. Normal WOB Abdomen: soft, non-distended Skin: warm, dry. No edema  ASSESSMENT/PLAN:   No problem-specific Assessment & Plan notes found for this encounter.   Postnasal drip Patient reports frequent mucus dripping in the back of her throat.  Feels like she always needs to clear it but denies coughing.  Likely due to allergies or other environmental triggers, as well as recent URI could be contributing.  -Continue Zyrtec daily --F/u if symptoms do not improve   DM2 Last a1c 8.3 on 11/2018. Current POC A1c the same at 8.3. Discussed dietary and lifestyle modifications.  -Continue metformin and Lantus --Check lipid panel   Health maintenance -- Hep C screen   Return in the next 2-3  weeks to follow up on health maintenance items as well as mood   12/2018, DO [facility]

## 2020-06-17 ENCOUNTER — Other Ambulatory Visit: Payer: Self-pay

## 2020-06-17 ENCOUNTER — Encounter: Payer: Self-pay | Admitting: Family Medicine

## 2020-06-17 ENCOUNTER — Ambulatory Visit (INDEPENDENT_AMBULATORY_CARE_PROVIDER_SITE_OTHER): Payer: Self-pay | Admitting: Family Medicine

## 2020-06-17 VITALS — BP 114/70 | HR 95 | Ht 68.0 in | Wt 314.1 lb

## 2020-06-17 DIAGNOSIS — E119 Type 2 diabetes mellitus without complications: Secondary | ICD-10-CM

## 2020-06-17 DIAGNOSIS — Z794 Long term (current) use of insulin: Secondary | ICD-10-CM

## 2020-06-17 LAB — POCT GLYCOSYLATED HEMOGLOBIN (HGB A1C): HbA1c, POC (controlled diabetic range): 8.3 % — AB (ref 0.0–7.0)

## 2020-06-17 MED ORDER — METFORMIN HCL ER 500 MG PO TB24: 500.0000 mg | ORAL_TABLET | Freq: Every day | ORAL | 3 refills | Status: DC

## 2020-06-17 NOTE — Patient Instructions (Signed)
It was great seeing you today! Today we checked your Hgb a1c and it was 8.3. We are also checking your cholesterol and refilling your Metformin.    Please check-out at the front desk before leaving the clinic. I'd like to see you back in the next 2-3 weeks to take care of some of your health maintenance items we did not get to today, but if you need to be seen earlier than that for any new issues we're happy to fit you in, just give [place] a call!  Visit Reminders: - Stop by the pharmacy to pick up your prescriptions - Continue to work on your healthy eating habits and incorporating exercise into your daily life.   Regarding lab work today:  Due to recent changes in healthcare laws, you may see the results of your imaging and laboratory studies on MyChart before your provider has had a chance to review them.  I understand that in some cases there may be results that are confusing or concerning to you. Not all laboratory results come back in the same time frame and you may be waiting for multiple results in order to interpret others.  Please give [place] 72 hours in order for your provider to thoroughly review all the results before contacting the office for clarification of your results. If everything is normal, you will get a letter in the mail or a message in My Chart. Please give [place] a call if you do not hear from [place] after 2 weeks.   Feel free to call with any questions or concerns at any time, at [phone].   Take care,  Dr. Cora Collum [facility]   [place] Scrape &[employer]; Richardson Dopp of Respiratory Medicine (7th ed., pp. [phone]). Elsevier.">  Postnasal Drip Postnasal drip is the feeling of mucus going down the back of your throat. Mucus is a slimy substance that moistens and cleans your nose and throat, as well as the air pockets in face bones near your forehead and cheeks (sinuses). Small amounts of mucus pass from your nose and sinuses down the back of your throat all  the time. This is normal. When you produce too much mucus or the mucus gets too thick, you can feel it. Some common causes of postnasal drip include:  Having more mucus because of: ? A cold or the flu. ? Allergies. ? Cold air. ? Certain medicines.  Having more mucus that is thicker because of: ? A sinus or nasal infection. ? Dry air. ? A food allergy. Follow these instructions at home: Relieving discomfort  Gargle with a salt-water mixture 3-4 times a day or as needed. To make a salt-water mixture, completely dissolve -1 tsp of salt in 1 cup of warm water.  If the air in your home is dry, use a humidifier to add moisture to the air.  Use a saline spray or container (neti pot) to flush out the nose (nasal irrigation). These methods can help clear away mucus and keep the nasal passages moist.   General instructions  Take over-the-counter and prescription medicines only as told by your health care provider.  Follow instructions from your health care provider about eating or drinking restrictions. You may need to avoid caffeine.  Avoid things that you know you are allergic to (allergens), like dust, mold, pollen, pets, or certain foods.  Drink enough fluid to keep your urine pale yellow.  Keep all follow-up visits as told by your health care provider. This is important.  Contact a health care provider if:  You have a fever.  You have a sore throat.  You have difficulty swallowing.  You have headache.  You have sinus pain.  You have a cough that does not go away.  The mucus from your nose becomes thick and is green or yellow in color.  You have cold or flu symptoms that last more than 10 days. Summary  Postnasal drip is the feeling of mucus going down the back of your throat.  If your health care provider approves, use nasal irrigation or a nasal spray 2?4 times a day.  Avoid things that you know you are allergic to (allergens), like dust, mold, pollen, pets, or  certain foods. This information is not intended to replace advice given to you by your health care provider. Make sure you discuss any questions you have with your health care provider. Document Revised: 12/02/2019 Document Reviewed: 12/02/2019 Elsevier Patient Education  2021 [employer].

## 2020-06-18 LAB — CBC
Hematocrit: 36 % (ref 34.0–46.6)
Hemoglobin: 12 g/dL (ref 11.1–15.9)
MCH: 28.1 pg (ref 26.6–33.0)
MCHC: 33.3 g/dL (ref 31.5–35.7)
MCV: 84 fL (ref 79–97)
Platelets: 264 10*3/uL (ref 150–450)
RBC: 4.27 x10E6/uL (ref 3.77–5.28)
RDW: 12.8 % (ref 11.7–15.4)
WBC: 6 10*3/uL (ref 3.4–10.8)

## 2020-06-18 LAB — LIPID PANEL
Chol/HDL Ratio: 3.7 ratio (ref 0.0–4.4)
Cholesterol, Total: 157 mg/dL (ref 100–199)
HDL: 42 mg/dL (ref >39)
LDL Chol Calc (NIH): 100 mg/dL — ABNORMAL HIGH (ref 0–99)
Triglycerides: 78 mg/dL (ref 0–149)
VLDL Cholesterol Cal: 15 mg/dL (ref 5–40)

## 2020-06-18 LAB — HCV INTERPRETATION

## 2020-06-18 LAB — HCV AB W REFLEX TO QUANT PCR: HCV Ab: <0.1 s/co ratio (ref 0.0–0.9)

## 2020-07-05 ENCOUNTER — Other Ambulatory Visit (HOSPITAL_COMMUNITY): Payer: Self-pay

## 2020-07-16 ENCOUNTER — Other Ambulatory Visit (HOSPITAL_COMMUNITY): Payer: Self-pay

## 2020-07-16 MED FILL — Venlafaxine HCl Cap ER 24HR 150 MG (Base Equivalent): ORAL | 30 days supply | Qty: 30 | Fill #1 | Status: AC

## 2020-08-04 ENCOUNTER — Telehealth: Payer: Self-pay | Admitting: Pharmacist

## 2020-08-04 DIAGNOSIS — E11628 Type 2 diabetes mellitus with other skin complications: Secondary | ICD-10-CM

## 2020-08-04 MED ORDER — BASAGLAR KWIKPEN 100 UNIT/ML ~~LOC~~ SOPN: 35.0000 [IU] | PEN_INJECTOR | Freq: Two times a day (BID) | SUBCUTANEOUS | Status: DC

## 2020-08-04 MED ORDER — INSULIN LISPRO (1 UNIT DIAL) 100 UNIT/ML (KWIKPEN)
30.0000 [IU] | PEN_INJECTOR | Freq: Two times a day (BID) | SUBCUTANEOUS | Status: DC
Start: 2020-08-04 — End: 2020-09-03

## 2020-08-04 NOTE — Telephone Encounter (Signed)
Noted and agree. 

## 2020-08-04 NOTE — Assessment & Plan Note (Signed)
Patient called to request insulin samples.  She has recently moved to [facility] and can no longer use the [facility] - MAP program.   She plans to continue her care at [facility].   Agreed to supply samples of both basal and bolus insulin.  Of note, patient has experienced several low readings lately, necessitating hypoglycemic management.   Reduced dose of insulin glargine from 40 BID to 35 units BID.   Medication Samples have been provided to the patient.

## 2020-08-04 NOTE — Telephone Encounter (Signed)
Patient called to request insulin samples.  She has recently moved to [facility] and can no longer use the [facility] Department - MAP program.   She plans to continue her care at [facility].   Agreed to supply samples of both basal and bolus insulin.  Of note, patient has experienced several low readings lately, necessitating hypoglycemic management.   Reduced dose of insulin glargine from 40 BID to 35 units BID.   Medication Samples have been provided to the patient.  Drug name: Basaglar (insluin glargine)      Strength:100 units/mo Qty: 2 pens  LOT: [id number]  Exp.Date: 09/02/2021  Dosing instructions: 35 units BID  Drug name: Humalog (insulin lispro)       Strength: 100units/ml        Qty: 2 pens  LOT: [id number]  Exp.Date: 02/02/2022  Dosing instructions: 30-40 units prior to meals.    The patient has been instructed regarding the correct time, dose, and frequency of taking this medication, including desired effects and most common side effects.   [name] 11:18 AM 08/04/2020  Patient will pick up later this AM.   Patient was instructed that Jannette Spanner, CPhT will contact to help complete paperwork for manufacturer support from [employer].

## 2020-08-17 ENCOUNTER — Telehealth: Payer: Self-pay

## 2020-08-17 NOTE — Progress Notes (Signed)
Submitted application for [facility] & HUMALOG KWIKPEN to [employer] for patient assistance.   Phone: [phone]

## 2020-08-17 NOTE — Telephone Encounter (Signed)
Left voice message for pt to return call. Needing to verify she rec'd the patient assistance application she req'd to be mailed to her.   Callback [phone]

## 2020-08-18 NOTE — Progress Notes (Signed)
Received notification from [facility] [employer] regarding approval for [facility]. Patient assistance approved from 08/10/20 to 08/10/21.  Medication will ship to pt home.  Pt also already enrolled for assistance with Humalog. This was previously done elsewhere and will expire 03/02/21.   Medication also ships to pt's home.  Phone: [phone]

## 2020-08-18 NOTE — Progress Notes (Signed)
Subjective:   Patient ID: [name] [name]    DOB: [birth year], 45 y.o. female   MRN: [id number]  [name] [name] is a 45 y.o. female with a history of hypertension, OSA on CPAP, diaphragmatic hernia, ulcers, type 2 diabetes, eczema, vitamin D deficiency, history of trichomonas infection, obesity, menopause, lower extremity swelling, hyperlipidemia, bipolar disorder, anxiety, anemia here for diabetes and fatigue  Acute concerns: 1. Fatigue x 3-4 months. History of anemia with last Hgb of 12 on 06/2020. Last A1C 8.3 on April 2022 as well. Last TSH WNL in 10/2019. History of OSA. Not on CPAP x 3 years due to insurance.  History of Vitamin D deficiency in 2019 that was noted to be 15. Currently not taking Vitamin D.  Over the counter medications include Ibuprofen.  Notes that she sleeps okay - she wakes up 2-3 times per night to pee.  She does snore.  Endorses normal bowel movements.  Denies taking naps. Denies orthopnea, PND.  Has been off of her lamictal for bipolar disorder x 1 month.  Diabetes: Last three A1C's below. Currently on Basaglar 35U BID, Humalog 30-40U prior to meals, Metformin 1000mg  BID. Endorses compliance. Notes CBGs range 90-160, one reading of 200 and 300. Denies any hypoglycemia. Denies any polyuria, polydipsia, polyphagia.  Lab Results  Component Value Date   HGBA1C 8.3 (A) 06/17/2020   HGBA1C 8.3 (A) 11/27/2018   HGBA1C 8.3 (A) 08/07/2018   Review of Systems:  Per HPI.   Objective:   BP 135/89   Pulse 77   Wt (!) 309 lb 6.4 oz (140.3 kg)   SpO2 98%   BMI 47.04 kg/m  Vitals and nursing note reviewed.  General: pleasant middle aged woman, sitting comfortably in exam chair, well nourished, well developed, in no acute distress with non-toxic appearance HEENT: normocephalic, atraumatic Neck: supple, non-tender without lymphadenopathy, no thyromegaly CV: regular rate and rhythm without murmurs, rubs, or gallops Lungs: clear to auscultation bilaterally with  normal work of breathing on room air, speaking in full sentences Skin: warm, dry Extremities: warm and well perfused, normal tone MSK: gait normal Neuro: Alert and oriented, speech normal, no tremors  Assessment & Plan:   Fatigue Chronic x 3 months, however looks like she has had this in the past as well. Has history of sleep apnea without use of CPAP. Also history of bipolar disorder not currently on mood stabilizer due to insurance/financial difficulties. Has elevated PHQ-9 today, no SI/HI. Honestly, I dont have concern for thyroid disorder, particularly given normal thyroid in 2021 however given level of concern, TSH ordered today. Hgb normal in April 2022. History of Vitamin D deficiency but has not been taking Vitamin D. I suspect this is still low but level obtained to determine need for higher dose therapy. B12 level today. Overall, I suspect her poor sleep and depression are contributing more to her fatigue than an actual metabolic disorder. Labs to rule out alternative causes. She is working on May 2022 which hopefully will allow her to get the CPAP and back on her Lamictal.   Diabetes mellitus (HCC) Chronic. CBG's appear better controlled. Continues to lose weight - congratulated her on the success.  Follow up 1 month with PCP for diabetes check up.   Vitamin D deficiency Encouraged to start daily Vitamin D3 1000IU and multivitamin.   Orders Placed This Encounter  Procedures   Vitamin D, 25-hydroxy   Vitamin B12   TSH    Meds ordered this encounter  Medications  DISCONTD: metFORMIN (GLUMETZA) 1000 MG (MOD) 24 hr tablet    Sig: Take 1 tablet (1,000 mg total) by mouth daily.    Dispense:  90 tablet    Refill:  1   metFORMIN (GLUCOPHAGE-XR) 500 MG 24 hr tablet    Sig: Take 2 tablets (1,000 mg total) by mouth daily with breakfast.    Dispense:  360 tablet    Refill:  3     Orpah Cobb, DO PGY-3, [facility] 08/19/2020 12:58 PM

## 2020-08-19 ENCOUNTER — Other Ambulatory Visit (HOSPITAL_COMMUNITY): Payer: Self-pay

## 2020-08-19 ENCOUNTER — Other Ambulatory Visit: Payer: Self-pay

## 2020-08-19 ENCOUNTER — Ambulatory Visit (INDEPENDENT_AMBULATORY_CARE_PROVIDER_SITE_OTHER): Payer: Self-pay | Admitting: Family Medicine

## 2020-08-19 ENCOUNTER — Encounter: Payer: Self-pay | Admitting: Family Medicine

## 2020-08-19 VITALS — BP 135/89 | HR 77 | Wt 309.4 lb

## 2020-08-19 DIAGNOSIS — Z794 Long term (current) use of insulin: Secondary | ICD-10-CM

## 2020-08-19 DIAGNOSIS — R5383 Other fatigue: Secondary | ICD-10-CM

## 2020-08-19 DIAGNOSIS — E559 Vitamin D deficiency, unspecified: Secondary | ICD-10-CM

## 2020-08-19 DIAGNOSIS — E119 Type 2 diabetes mellitus without complications: Secondary | ICD-10-CM

## 2020-08-19 MED ORDER — METFORMIN HCL ER 500 MG PO TB24
1000.0000 mg | ORAL_TABLET | Freq: Every day | ORAL | 3 refills | Status: DC
  Filled 2020-08-19: qty 180, 90d supply, fill #0
  Filled 2020-11-25: qty 180, 90d supply, fill #1

## 2020-08-19 MED ORDER — METFORMIN HCL ER (MOD) 1000 MG PO TB24: 1000.0000 mg | ORAL_TABLET | Freq: Every day | ORAL | 1 refills | Status: DC

## 2020-08-19 MED FILL — Venlafaxine HCl Cap ER 24HR 150 MG (Base Equivalent): ORAL | 30 days supply | Qty: 30 | Fill #2 | Status: AC

## 2020-08-19 NOTE — Assessment & Plan Note (Addendum)
Chronic x 3 months, however looks like she has had this in the past as well. Has history of sleep apnea without use of CPAP. Also history of bipolar disorder not currently on mood stabilizer due to insurance/financial difficulties. Has elevated PHQ-9 today, no SI/HI. Honestly, I dont have concern for thyroid disorder, particularly given normal thyroid in 2021 however given level of concern, TSH ordered today. Hgb normal in April 2022. History of Vitamin D deficiency but has not been taking Vitamin D. I suspect this is still low but level obtained to determine need for higher dose therapy. B12 level today. Overall, I suspect her poor sleep and depression are contributing more to her fatigue than an actual metabolic disorder. Labs to rule out alternative causes. She is working on [employer] which hopefully will allow her to get the CPAP and back on her Lamictal.

## 2020-08-19 NOTE — Patient Instructions (Addendum)
It was a pleasure to see you today!  Thank you for choosing [facility] for your primary care.   Congratulations on losing weight.  Please start a daily Vitamin D3 1000IU.   We are checking some labs today, I will call you if they are abnormal will send you a MyChart message or a letter if they are normal.  If you do not hear about your labs in the next 2 weeks please let us know.  BRING ALL OF YOUR MEDICATIONS WITH YOU TO EVERY VISIT   You should return to our clinic in 1 month for diabetes  Best Wishes,   Orpah Cobb, DO

## 2020-08-19 NOTE — Assessment & Plan Note (Addendum)
Encouraged to start daily Vitamin D3 1000IU and multivitamin.

## 2020-08-19 NOTE — Assessment & Plan Note (Signed)
Chronic. CBG's appear better controlled. Continues to lose weight - congratulated her on the success.  Follow up 1 month with PCP for diabetes check up.

## 2020-08-20 ENCOUNTER — Encounter: Payer: Self-pay | Admitting: Family Medicine

## 2020-08-20 DIAGNOSIS — E119 Type 2 diabetes mellitus without complications: Secondary | ICD-10-CM

## 2020-08-20 LAB — TSH: TSH: 2.54 u[IU]/mL (ref 0.450–4.500)

## 2020-08-20 LAB — VITAMIN D 25 HYDROXY (VIT D DEFICIENCY, FRACTURES): Vit D, 25-Hydroxy: 14.8 ng/mL — ABNORMAL LOW (ref 30.0–100.0)

## 2020-08-20 LAB — VITAMIN B12: Vitamin B-12: 769 pg/mL (ref 232–1245)

## 2020-08-25 ENCOUNTER — Telehealth: Payer: Self-pay

## 2020-08-25 NOTE — Telephone Encounter (Signed)
Patient calls nurse line wanting to discuss Vitamin D. Patient advised to start taking Vitamin D 1000IU daily. Patient advised she can purchase this OTC. Patient advised to come in for a FU visit in one month to recheck her levels.

## 2020-08-27 ENCOUNTER — Other Ambulatory Visit: Payer: Self-pay | Admitting: Family Medicine

## 2020-08-31 ENCOUNTER — Other Ambulatory Visit: Payer: Self-pay

## 2020-08-31 ENCOUNTER — Other Ambulatory Visit: Payer: Self-pay | Admitting: Family Medicine

## 2020-08-31 NOTE — Telephone Encounter (Signed)
Pt is currently enrolled in patient assistance with [employer] through the [place] [facility] de[facility]. From 03/24/20-03/23/21 for medication Lamictal. Pt will now receive med to her home address instead of the health dept.

## 2020-08-31 NOTE — Progress Notes (Signed)
Pt came by office requesting sample of medication due to almost being out. She is currently enrolled in patient assistance with [employer] through [employer]. & will try to pick up her last fill of medication there before changing the shipping address to her home. This may take a few days, so we were able to provide her with a vial. I will be able to assist with her future refills with the company if needed. They no longer will service her at the health dept due to her moving out of [place].   Medication Samples have been provided to the patient.  Drug name: HUMALOG VIAL        Strength: 100U        Qty: 1 VIAL  LOT: [id number]  Exp.Date: 03/2021  Dosing instructions: INJECT 30-40 UNITS TWICE DAILY BEFORE MEALS   Lamount Cohen Devean Skoczylas 2:04 PM 08/31/2020

## 2020-09-01 LAB — MICROALBUMIN / CREATININE URINE RATIO
Creatinine, Urine: 154.3 mg/dL
Microalb/Creat Ratio: 51 mg/g creat — ABNORMAL HIGH (ref 0–29)
Microalbumin, Urine: 78.3 ug/mL

## 2020-09-02 ENCOUNTER — Telehealth: Payer: Self-pay

## 2020-09-02 NOTE — Telephone Encounter (Signed)
Patient LVM on nurse line requesting results from urine micro. Please advise.

## 2020-09-03 ENCOUNTER — Telehealth: Payer: Self-pay

## 2020-09-03 MED ORDER — INSULIN LISPRO (1 UNIT DIAL) 100 UNIT/ML (KWIKPEN): 30.0000 [IU] | PEN_INJECTOR | Freq: Two times a day (BID) | SUBCUTANEOUS | 11 refills | Status: DC

## 2020-09-03 NOTE — Telephone Encounter (Signed)
Patient contacted me directly via office phone to request support with new prescription for HUMALOG.   She states the [place] [facility] requested a new prescription and then they would dispense her Humalog to her.   New Prescription sent for Humalog.   Patient aware to contact them later this AM.

## 2020-09-03 NOTE — Telephone Encounter (Signed)
Patient calls nurse line requesting to speak with Durward Mallard regarding medication assistance program.   Please return call to patient at [phone].   Veronda Prude, RN

## 2020-09-08 ENCOUNTER — Other Ambulatory Visit: Payer: Self-pay

## 2020-09-08 MED ORDER — LAMOTRIGINE 100 MG PO TABS
ORAL_TABLET | ORAL | 0 refills | Status: DC
Start: 2020-09-08 — End: 2021-08-11

## 2020-09-16 ENCOUNTER — Telehealth: Payer: Self-pay | Admitting: Pharmacist

## 2020-09-16 NOTE — Telephone Encounter (Signed)
Patient calls my office directly reporting she has a blood sugar of 82  She states recent blood glucose readings in the low 100s.   She states that usually she has jittery feelings with "low readings" and feels "tired" with high readings.  Currently she reports feeling very tired and has a reading of 82.    She has not yet eaten lunch.  We discussed that eating something soon (early lunch) should be effective to avoid symptomatic low.  Patient expressed appreciation.

## 2020-09-20 ENCOUNTER — Other Ambulatory Visit: Payer: Self-pay | Admitting: Family Medicine

## 2020-09-20 ENCOUNTER — Other Ambulatory Visit (HOSPITAL_COMMUNITY): Payer: Self-pay

## 2020-09-20 MED ORDER — VENLAFAXINE HCL ER 150 MG PO CP24
ORAL_CAPSULE | Freq: Every day | ORAL | 3 refills | Status: DC
  Filled 2020-09-20: qty 30, 30d supply, fill #0
  Filled 2020-10-25: qty 30, 30d supply, fill #1
  Filled 2020-11-25: qty 30, 30d supply, fill #2
  Filled 2021-01-03: qty 30, 30d supply, fill #3

## 2020-09-22 ENCOUNTER — Other Ambulatory Visit (HOSPITAL_COMMUNITY): Payer: Self-pay

## 2020-09-22 ENCOUNTER — Ambulatory Visit (INDEPENDENT_AMBULATORY_CARE_PROVIDER_SITE_OTHER): Payer: Self-pay | Admitting: Family Medicine

## 2020-09-22 ENCOUNTER — Other Ambulatory Visit: Payer: Self-pay

## 2020-09-22 VITALS — BP 125/75 | HR 98 | Ht 68.23 in | Wt 306.0 lb

## 2020-09-22 DIAGNOSIS — Z794 Long term (current) use of insulin: Secondary | ICD-10-CM

## 2020-09-22 DIAGNOSIS — R350 Frequency of micturition: Secondary | ICD-10-CM

## 2020-09-22 DIAGNOSIS — M549 Dorsalgia, unspecified: Secondary | ICD-10-CM

## 2020-09-22 DIAGNOSIS — M25562 Pain in left knee: Secondary | ICD-10-CM | POA: Insufficient documentation

## 2020-09-22 DIAGNOSIS — E559 Vitamin D deficiency, unspecified: Secondary | ICD-10-CM

## 2020-09-22 DIAGNOSIS — E119 Type 2 diabetes mellitus without complications: Secondary | ICD-10-CM

## 2020-09-22 LAB — POCT GLYCOSYLATED HEMOGLOBIN (HGB A1C): HbA1c, POC (controlled diabetic range): 7.1 % — AB (ref 0.0–7.0)

## 2020-09-22 LAB — POCT URINALYSIS DIP (MANUAL ENTRY)
Bilirubin, UA: NEGATIVE
Blood, UA: NEGATIVE
Glucose, UA: NEGATIVE mg/dL
Ketones, POC UA: NEGATIVE mg/dL
Leukocytes, UA: NEGATIVE
Nitrite, UA: NEGATIVE
Protein Ur, POC: NEGATIVE mg/dL
Spec Grav, UA: 1.025 (ref 1.010–1.025)
Urobilinogen, UA: 0.2 E.U./dL
pH, UA: 6 (ref 5.0–8.0)

## 2020-09-22 MED ORDER — DICLOFENAC SODIUM 1 % EX GEL
4.0000 g | Freq: Four times a day (QID) | CUTANEOUS | 2 refills | Status: DC
  Filled 2020-09-22: qty 400, 25d supply, fill #0
  Filled 2020-10-07: qty 400, 20d supply, fill #0

## 2020-09-22 MED ORDER — FLUTICASONE PROPIONATE 50 MCG/ACT NA SUSP
2.0000 | Freq: Every day | NASAL | 2 refills | Status: DC
  Filled 2020-09-22 – 2020-10-07 (×2): qty 16, 30d supply, fill #0

## 2020-09-22 MED ORDER — FLUTICASONE PROPIONATE 50 MCG/ACT NA SUSP: 2.0000 | Freq: Every day | NASAL | 2 refills | Status: DC

## 2020-09-22 MED ORDER — MELOXICAM 7.5 MG PO TABS: 7.5000 mg | ORAL_TABLET | Freq: Every day | ORAL | 0 refills | Status: DC

## 2020-09-22 MED ORDER — DICLOFENAC SODIUM 1 % EX GEL: 4.0000 g | Freq: Four times a day (QID) | CUTANEOUS | 2 refills | Status: DC

## 2020-09-22 MED ORDER — MELOXICAM 7.5 MG PO TABS
7.5000 mg | ORAL_TABLET | Freq: Every day | ORAL | 0 refills | Status: DC
  Filled 2020-09-22 – 2020-10-07 (×2): qty 30, 30d supply, fill #0

## 2020-09-22 NOTE — Assessment & Plan Note (Signed)
This acute onset knee pain is likely musculoskeletal in origin and may benefit from systemic as well as local anti-inflammatory treatments. Differential includes osteoarthritis, bursitis, meniscal injury, or other acute etiology. Further imaging will help narrow the diagnosis and plain film X-ray was ordered.  To manage symptoms and promote rehabilitation, meloxicam 7.5 mg PRN, Voltaren gel PRN, and a knee brace were all ordered. Recommended alternating heat and ice therapy at home and resting the knee whenever possible. Suggested pursuing PT if financial aid allows.

## 2020-09-22 NOTE — Patient Instructions (Addendum)
It was great seeing you today!  Great job with your weight loss! I am sorry you have been experiencing knee pain. We placed an order for an x-ray to be done at [facility] on [address]. You can walk in at any time to get this done.  I have also prescribed Voltaren gel to use on your left knee up to 4 times a day as needed.  I have also prescribed meloxicam 7.5 mg daily as needed.  I submitted an order for the knee brace, but if insurance does not cover it you get it at the pharmacy.  He can alternate between ice and heat at home.  Also recommend physical therapy when it makes sense for you financially.  We are also checking your blood work to check on your kidney function. I will call you with any abnormal results   For your allergies I recommend using Flonase 2 sprays in each nostril daily. I have submitted a prescription in case that is a more formal option.  Also recommend getting a supplement with calcium and Vitamin D as well as magnesium 400mg  which will help with Vit D absorption as well.   Recommend returning for your Pap smear and to take care of some of your health maintenance items.   Please check-out at the front desk before leaving the clinic. I'd like to see you back in the next couple of weeks, but if you need to be seen earlier than that for any new issues we're happy to fit you in, just give a call!  Visit Reminders: - Stop by the pharmacy to pick up your prescriptions  - Continue to work on your healthy eating habits and incorporating exercise into your daily life.    Feel free to call with any questions or concerns at any time, at [phone].   Take care,  Dr. [phone] [facility]

## 2020-09-22 NOTE — Assessment & Plan Note (Addendum)
The patient's diabetes management seems to have improved. Her gradual weight loss (8 lbs over 3 months), decreased insulin use, and improved diet are positive changes. Her microalbumin/creatinine ratio from 2 weeks ago is slightly elevated at 51, but not very concerning. Hb A1c was obtained today and was 7.1, further signaling improvement of diabetes management. Polyuria and polydipsia may be symptoms of diabetes, or of other etiology.  Congratulated her on continuous weight loss and dietary improvements. Reassured her that positive health changes will follow. Encouraged further increase of exercise (possibly in the pool given current knee pain) and maintaining healthy diet.

## 2020-09-22 NOTE — Progress Notes (Addendum)
SUBJECTIVE:   CHIEF COMPLAINT / HPI:   [name] [name] is a 45 year old female with a history of T2DM, HLD, vitamin D deficiency, and HTN presenting for diabetes f/u and acute onset left knee pain.  Left knee pain The patient has been experiencing worsening left knee pain for the past 3 weeks. She has had to favor her right side, which has lately made her right knee hurt as well. She reports that one day she was standing and her left knee suddenly gave out. It has been hurting since. She has had no specific trauma or recent injury. She is often unable to stand without assistance and always has to at least lean on a desk or chair to stand. She limps when she walks and is unable to fully bear weight on her left leg.  The patient tried wearing an ACE wrap a few days ago and that seemed to help. She has been taking ibuprofen up to 800 mg per day for a few months for chronic back pain, but switched to Tylenol 2 weeks ago, which has been helping control her pain better. She is willing to try PT, but needs [facility] financial assistance before she can afford it. She has submitted the appropriate forms already.  Type 2 diabetes & urine frequency The patient reports improvement in her blood glucose readings and insulin use. Her glucose values average approximately 130 and have stayed below 200 for the past few months. She is taking less Basaglar (35 units 2 times a day) and rarely takes her Humalog (20 units a few times a week). She continues to take her metformin. She endorses an improvement in her lifestyle habits and she has lost weight. She is better able to tolerate lower blood glucose levels (she previously got cold sweats and felt ravenous when below 100 blood glucose).  She reports no neuropathy, which she previously felt. However, the patient has been urinating more frequently recently; over a dozen times a day (she loses count). She also reports feeling more thirsty than normal. Her lower back  has been acutely in pain, especially when straining during a BM or when urinating. She feels she is urinating more slowly right now than a few months ago.  Vitamin D deficiency The patient has been taking 1,000 mg of OTC vitamin D supplements since her last visit. She has been on a vegan diet since June 1st and has only begun eating cheese a few days ago. She has not been consuming any additional calcium.  Seasonal allergies The patient reports congestion and nasal discomfort due to allergies for the past few weeks.  ROS Pertinent positives: Tiredness, congestion, runny nose  Pertinent negatives: Bowel movement changes, fevers, chills, nausea, vomiting, diarrhea, nighttime diaphoresis   PERTINENT  PMH / PSH:  Family Hx of diabetes on the paternal side.  Current Medications: - Basaglar 35 units 2x/day - Humalog 20 units few times a week, PRN - Metformin 1,000 mg daily - Vitamin D3 supplement 1,000 mg daily  Vaccines: - The patient refuses the COVID vaccine at this time due to prior COVID infection.  OBJECTIVE:   BP 125/75   Pulse 98   Ht 5' 8.23" (1.733 m)   Wt (!) 306 lb (138.8 kg)   SpO2 97%   BMI 46.22 kg/m    General: Age-appropriate, resting comfortably in chair, NAD, WNWD, alert and at baseline. Neck: Supple. No LAD, thyroid smooth and not palpable. Cardiovascular: Regular rate and rhythm. Normal S1/S2. No  murmurs, rubs, or gallops appreciated. 2+ radial pulses. Pulmonary: Clear bilaterally to ascultation. No increased WOB, no accessory muscle usage. No wheezes, rales, or crackles. Genitourinary: Right-sided CVA tenderness. MSK: Left knee swelling on inspection. TTP over lateral and medial aspects of L knee (lateral > medial). Reduced L knee active ROM with extension and internal and external rotation. Psych: Pleasant and appropriate  ASSESSMENT/PLAN:   Knee pain, left This acute onset knee pain is likely musculoskeletal in origin and may benefit from systemic as  well as local anti-inflammatory treatments. Differential includes osteoarthritis, bursitis, meniscal injury, or other acute etiology. Further imaging will help narrow the diagnosis and plain film X-ray was ordered.  To manage symptoms and promote rehabilitation, meloxicam 7.5 mg PRN, Voltaren gel PRN, and a knee brace were all ordered. Recommended alternating heat and ice therapy at home and resting the knee whenever possible. Suggested pursuing PT if financial aid allows.  Type 2 diabetes mellitus without complication, with long-term current use of insulin (HCC) The patient's diabetes management seems to have improved. Her gradual weight loss (8 lbs over 3 months), decreased insulin use, and improved diet are positive changes. Her microalbumin/creatinine ratio from 2 weeks ago is slightly elevated at 51, but not very concerning. Hb A1c was obtained today and was 7.1, further signaling improvement of diabetes management. Polyuria and polydipsia may be symptoms of diabetes, or of other etiology.  Congratulated her on continuous weight loss and dietary improvements. Reassured her that positive health changes will follow. Encouraged further increase of exercise (possibly in the pool given current knee pain) and maintaining healthy diet.  Urine frequency The patient's polyuria along with CVA tenderness indicate potential kidney involvement. A UA was obtained to rule out STI causes for polyuria. The differential for CVA tenderness and polyuria includes pyelonephritis as well as other kidney issues. Nephrotic and nephritic syndromes are unlikely due to lack of edema and hematuria. Obtained BMP to further assess kidney function and evaluate the need for a new course of action.  Vitamin D deficiency The current vegan diet and lack of calcium sources is not ideal for vitamin D absorption. Recommended purchasing and taking an OTC calcium supplement along with the vitamin D supplement. Drawing labs to assess for  vitamin D deficiency again is unnecessary at this time as the last set of labs was very recent. Will reassess in the future.  Seasonal allergies Prescribed fluticasone to help manage nasal congestion and allergy symptoms.   [facility] doctor, Medical Student [facility]    I was personally present and performed or re-performed the history, physical exam and medical decision making activities of this service and have verified that the service and findings are accurately documented in the student's note.  Cora Collum, DO                  09/23/2020, 10:02 PM

## 2020-09-22 NOTE — Assessment & Plan Note (Signed)
The current vegan diet and lack of calcium sources is not ideal for vitamin D absorption. Recommended purchasing and taking an OTC calcium supplement along with the vitamin D supplement. Drawing labs to assess for vitamin D deficiency again is unnecessary at this time as the last set of labs was very recent. Will reassess in the future.

## 2020-09-22 NOTE — Assessment & Plan Note (Signed)
The patient's polyuria along with CVA tenderness indicate potential kidney involvement. A UA was obtained to rule out STI causes for polyuria. The differential for CVA tenderness and polyuria includes pyelonephritis as well as other kidney issues. Nephrotic and nephritic syndromes are unlikely due to lack of edema and hematuria. Obtained BMP to further assess kidney function and evaluate the need for a new course of action.

## 2020-09-23 LAB — BASIC METABOLIC PANEL
BUN/Creatinine Ratio: 13 (ref 9–23)
BUN: 9 mg/dL (ref 6–24)
CO2: 24 mmol/L (ref 20–29)
Calcium: 9.7 mg/dL (ref 8.7–10.2)
Chloride: 105 mmol/L (ref 96–106)
Creatinine, Ser: 0.7 mg/dL (ref 0.57–1.00)
Glucose: 105 mg/dL — ABNORMAL HIGH (ref 65–99)
Potassium: 4 mmol/L (ref 3.5–5.2)
Sodium: 144 mmol/L (ref 134–144)
eGFR: 108 mL/min/{1.73_m2} (ref >59)

## 2020-09-30 ENCOUNTER — Other Ambulatory Visit (HOSPITAL_COMMUNITY): Payer: Self-pay

## 2020-10-07 ENCOUNTER — Telehealth: Payer: Self-pay

## 2020-10-07 ENCOUNTER — Other Ambulatory Visit (HOSPITAL_COMMUNITY): Payer: Self-pay

## 2020-10-07 NOTE — Telephone Encounter (Signed)
Patient calls nurse line requesting to speak with provider regarding kidney function labs.   Patient is still experiencing fatigue and would like to discuss if any lab findings could be contributing to this.   Please advise.   Veronda Prude, RN

## 2020-10-25 ENCOUNTER — Other Ambulatory Visit (HOSPITAL_COMMUNITY): Payer: Self-pay

## 2020-10-28 ENCOUNTER — Telehealth: Payer: Self-pay | Admitting: Pharmacist

## 2020-10-28 NOTE — Telephone Encounter (Signed)
Patient called my direct line this AM requesting a letter to allow her to donate plasma.   I shared that I would not be the correct person to provide clearance for this letter.  I offered to share the request for a letter clearing the patient to donate plasma with her PCP.   She stated the letter should be addressed to [facility] plasma and needed to state that her diabetes was under good control.    She asked that this letter be  forwarded to her to share with the business by providing it in MyChart link.   I stated that I would share this request with her PCP.

## 2020-11-04 ENCOUNTER — Encounter: Payer: Self-pay | Admitting: Family Medicine

## 2020-11-16 ENCOUNTER — Other Ambulatory Visit (HOSPITAL_COMMUNITY): Payer: Self-pay

## 2020-11-16 ENCOUNTER — Other Ambulatory Visit: Payer: Self-pay | Admitting: Family Medicine

## 2020-11-17 ENCOUNTER — Other Ambulatory Visit (HOSPITAL_COMMUNITY): Payer: Self-pay

## 2020-11-17 MED ORDER — MELOXICAM 7.5 MG PO TABS
7.5000 mg | ORAL_TABLET | Freq: Every day | ORAL | 0 refills | Status: DC
  Filled 2020-11-17: qty 30, 30d supply, fill #0

## 2020-11-22 ENCOUNTER — Telehealth: Payer: Self-pay

## 2020-11-22 DIAGNOSIS — M25561 Pain in right knee: Secondary | ICD-10-CM

## 2020-11-22 NOTE — Addendum Note (Signed)
Addended by: Cora Collum on: 11/22/2020 04:51 PM   Modules accepted: Orders

## 2020-11-22 NOTE — Telephone Encounter (Signed)
Patient calls nurse line stating she has not yet had knee xray done. Patient is requesting we change the order to bilateral verses just the left. Patient reports she has been overcompensating with her right knee now and has been experiencing a lot of pain. Please advise.

## 2020-11-23 NOTE — Telephone Encounter (Signed)
Patient informed. 

## 2020-11-25 ENCOUNTER — Ambulatory Visit
Admission: RE | Admit: 2020-11-25 | Discharge: 2020-11-25 | Disposition: A | Discharge status: Home / Self Care | Payer: No Typology Code available for payment source | Source: Ambulatory Visit | Attending: Family Medicine | Admitting: Family Medicine

## 2020-11-25 ENCOUNTER — Other Ambulatory Visit (HOSPITAL_COMMUNITY): Payer: Self-pay

## 2020-11-25 DIAGNOSIS — M25561 Pain in right knee: Secondary | ICD-10-CM

## 2020-11-26 ENCOUNTER — Telehealth: Payer: Self-pay

## 2020-11-26 ENCOUNTER — Other Ambulatory Visit: Payer: Self-pay | Admitting: Family Medicine

## 2020-11-26 DIAGNOSIS — M25561 Pain in right knee: Secondary | ICD-10-CM

## 2020-11-26 DIAGNOSIS — M25562 Pain in left knee: Secondary | ICD-10-CM

## 2020-11-26 MED ORDER — NAPROXEN 500 MG PO TABS: 500.0000 mg | ORAL_TABLET | Freq: Two times a day (BID) | ORAL | 0 refills | Status: DC

## 2020-11-26 MED ORDER — KNEE COMPRESSION SLEEVE/L/XL MISC: 0 refills | Status: DC

## 2020-11-26 NOTE — Telephone Encounter (Signed)
Patient calls nurse line requesting results of recent imaging. Please advise.

## 2020-11-26 NOTE — Progress Notes (Signed)
  Called and discussed knee x ray results with patient. Reviewed imaging with Dr. Jennette Kettle. Reassured her that the there were no acute fractures or dislocation. Discussed degenerative changes. She states she is not getting any relief from Meloxicam so will try Naproxen instead. Recommended continuing with icing, using Voltaren gel, and encouraged physical therapy. Will place referral. Discussed weight loss though she states she lost weight and knee pain worsened which has been discouraging for her.  She requests a Rx for a knee sleeve. She has a brace but it does not allow her to be mobile.   Additionally she states she needs a note saying she is diabetic and she purchases test strips and needles so that insurance can cover it. Will leave at front desk for her.

## 2020-12-29 ENCOUNTER — Telehealth: Payer: Self-pay

## 2020-12-29 NOTE — Telephone Encounter (Signed)
Patient calls nurse line reporting bilateral LE swelling. Patient reports symptoms started over the last few days and this is a new problem for her. Patient reports pain and puffiness. Patient denies SOB or chest pain. Patient also reports two open sores one of her back and one on her breast. Patient denies any trauma to the areas, "I just happened to notice them." Patient denies any signs of infection, no fever or chills or malodorous discharge. Patient scheduled for tomorrow.   ED precautions given in the meantime.

## 2020-12-30 ENCOUNTER — Ambulatory Visit (INDEPENDENT_AMBULATORY_CARE_PROVIDER_SITE_OTHER): Payer: Self-pay | Admitting: Family Medicine

## 2020-12-30 ENCOUNTER — Other Ambulatory Visit: Payer: Self-pay

## 2020-12-30 VITALS — Wt 302.0 lb

## 2020-12-30 DIAGNOSIS — L989 Disorder of the skin and subcutaneous tissue, unspecified: Secondary | ICD-10-CM

## 2020-12-30 DIAGNOSIS — Z1231 Encounter for screening mammogram for malignant neoplasm of breast: Secondary | ICD-10-CM

## 2020-12-30 DIAGNOSIS — M7989 Other specified soft tissue disorders: Secondary | ICD-10-CM

## 2020-12-30 DIAGNOSIS — R82998 Other abnormal findings in urine: Secondary | ICD-10-CM

## 2020-12-30 LAB — POCT URINALYSIS DIP (MANUAL ENTRY)
Bilirubin, UA: NEGATIVE
Blood, UA: NEGATIVE
Glucose, UA: NEGATIVE mg/dL
Ketones, POC UA: NEGATIVE mg/dL
Leukocytes, UA: NEGATIVE
Nitrite, UA: NEGATIVE
Protein Ur, POC: 30 mg/dL — AB
Spec Grav, UA: 1.025
Urobilinogen, UA: 0.2 U/dL
pH, UA: 6

## 2020-12-30 LAB — POCT UA - MICROSCOPIC ONLY

## 2020-12-30 NOTE — Patient Instructions (Signed)
It was wonderful to meet you today. Thank you for allowing me to be a part of your care. Below is a short summary of what we discussed at your visit today:  Leg swelling I have sent your urine and blood for some tests. If the results are normal, I will send you a letter or MyChart message. If the results are abnormal, I will give you a call.    Sores Stop using neosporin Start using vaseline and a bandage to cover Let's keep a watchful eye on it, as it is already healing on its own Please call the breast center and make an appointment for mammogram [phone])  Return precautions If you develop fever, chills, muscle aches, please come back and see [place].  If you develop shortness of breath, please come back and see [place] Call us and come back if these worsen, develop redness and swelling, drain pus, or have swelling streaking around it    Please bring all of your medications to every appointment!  If you have any questions or concerns, please do not hesitate to contact us via phone or MyChart message.   Fayette Pho, MD

## 2020-12-30 NOTE — Progress Notes (Signed)
    SUBJECTIVE:   CHIEF COMPLAINT / HPI:   Bilateral ankle swelling - 4 days duration - didn't improve with elevation - no pain to walk - no recent trips, long car rides, air travel - no aggravating or relieving factors - pain occasionally radiates proximally - recently started taking B12, vitamin D3 a couple weeks ago - no recent SOB, orthopnea - has been at her dad's bedside in hospital, so sleeping in recliner there  Open sores on back and breast - noticed one on back last week - noticed one on breast Tuesday - start with sharp pain that feels like something is stinging or stabbing, then looks and finds this open wound - no changing in size since she found them - no fevers, chills, muscle aches - no surrounding erythema or streaking - some clear ish brownish discharge - has changed color since finding it, was white at first and is now more reddish pink and erythematous - no black eschar centers, no sloughing skin - she does report some darker urine, but not sure if her recent B12 and vitamin D supplements    PERTINENT  PMH / PSH: HTN, OSA, T2DM, OA, eczema, HLD, obesity, fatigue, anxiety, bipolar disorder  OBJECTIVE:   BP (P) 136/78   Pulse (P) 85   Wt (!) 302 lb (137 kg)   SpO2 100%   BMI 45.61 kg/m    PHQ-9:  Depression screen [facility] 2/9 12/30/2020 09/22/2020 06/17/2020  Decreased Interest 0 0 1  Down, Depressed, Hopeless 0 0 1  PHQ - 2 Score 0 0 2  Altered sleeping 0 1 1  Tired, decreased energy 1 3 1   Change in appetite 1 0 1  Feeling bad or failure about yourself  0 0 1  Trouble concentrating 1 0 1  Moving slowly or fidgety/restless 0 0 1  Suicidal thoughts 0 0 0  PHQ-9 Score 3 4 8   Difficult doing work/chores - - Somewhat difficult  Some recent data might be hidden     GAD-7: No flowsheet data found.   Physical Exam Cardiovascular: Regular rate and rhythm, S1 and S2 present, no murmurs auscultated Respiratory: Lung fields clear to auscultation  bilaterally Extremities: 1+ pitting bilateral lower extremity edema, palpable pedal and pretibial pulses bilaterally Skin: two punctate lesions with central wound healing one on breast one on back, no surrounding erythema, warmth or swelling, no streaking erythema      Photo from patient's phone, showing lesion on 10/25 when initially discovered    ASSESSMENT/PLAN:   Leg swelling New problem, stable over last 4 days. Unclear etiology. No konwn cardiac conditions or medications that would explain. No red flag symptoms. Will obtain CBC, CMP, BNP, TSH, UA+micro. Most likely due to dietary salt intake and increased dependent positioning. Less likely HF, hypoalbuminemia, or liver dysfunction. Will follow up labs.   Skin lesion Two new lesions, appear to be improving. Patient has never experienced before. Unclear etiology. Does not appear to be infectious in nature. Less likely pressure-related given locations (left flank, right lateral breast). Could be related to insect or spider bite but not a congruent presentation. Not likely to be related to breast malignancy given additional location on flank, but will send for her routine screening mammogram. Recommend stopping antibiotic ointment, using vaseline, and normal wound care. Return precautions covered, see AVS.      , MD [facility]

## 2020-12-31 LAB — COMPREHENSIVE METABOLIC PANEL
ALT: 12 IU/L (ref 0–32)
AST: 13 IU/L (ref 0–40)
Albumin/Globulin Ratio: 1.6 (ref 1.2–2.2)
Albumin: 4.1 g/dL (ref 3.8–4.8)
Alkaline Phosphatase: 97 IU/L (ref 44–121)
BUN/Creatinine Ratio: 13 (ref 9–23)
BUN: 8 mg/dL (ref 6–24)
Bilirubin Total: <0.2 mg/dL (ref 0.0–1.2)
CO2: 26 mmol/L (ref 20–29)
Calcium: 9.1 mg/dL (ref 8.7–10.2)
Chloride: 105 mmol/L (ref 96–106)
Creatinine, Ser: 0.63 mg/dL (ref 0.57–1.00)
Globulin, Total: 2.5 g/dL (ref 1.5–4.5)
Glucose: 143 mg/dL — ABNORMAL HIGH (ref 70–99)
Potassium: 3.8 mmol/L (ref 3.5–5.2)
Sodium: 143 mmol/L (ref 134–144)
Total Protein: 6.6 g/dL (ref 6.0–8.5)
eGFR: 111 mL/min/{1.73_m2} (ref >59)

## 2020-12-31 LAB — TSH: TSH: 2.28 u[IU]/mL (ref 0.450–4.500)

## 2020-12-31 LAB — CBC
Hematocrit: 35 % (ref 34.0–46.6)
Hemoglobin: 11.6 g/dL (ref 11.1–15.9)
MCH: 28.2 pg (ref 26.6–33.0)
MCHC: 33.1 g/dL (ref 31.5–35.7)
MCV: 85 fL (ref 79–97)
Platelets: 258 10*3/uL (ref 150–450)
RBC: 4.11 x10E6/uL (ref 3.77–5.28)
RDW: 12.9 % (ref 11.7–15.4)
WBC: 6.2 10*3/uL (ref 3.4–10.8)

## 2020-12-31 LAB — BRAIN NATRIURETIC PEPTIDE: BNP: 28 pg/mL (ref 0.0–100.0)

## 2021-01-03 ENCOUNTER — Other Ambulatory Visit (HOSPITAL_COMMUNITY): Payer: Self-pay

## 2021-01-03 ENCOUNTER — Other Ambulatory Visit: Payer: Self-pay | Admitting: Family Medicine

## 2021-01-04 ENCOUNTER — Telehealth: Payer: Self-pay

## 2021-01-04 NOTE — Telephone Encounter (Signed)
Patient calls nurse line requesting urine results from 10/27. Patient reports she saw them in mychart. Patient reports she still has swelling in her legs and ankles. Patient would like to discuss a plan. Please advise.

## 2021-01-05 ENCOUNTER — Other Ambulatory Visit (HOSPITAL_COMMUNITY): Payer: Self-pay

## 2021-01-06 ENCOUNTER — Other Ambulatory Visit (HOSPITAL_COMMUNITY): Payer: Self-pay

## 2021-01-06 ENCOUNTER — Other Ambulatory Visit: Payer: Self-pay | Admitting: Family Medicine

## 2021-01-07 ENCOUNTER — Other Ambulatory Visit (HOSPITAL_COMMUNITY): Payer: Self-pay

## 2021-01-07 DIAGNOSIS — L989 Disorder of the skin and subcutaneous tissue, unspecified: Secondary | ICD-10-CM | POA: Insufficient documentation

## 2021-01-07 MED ORDER — MELOXICAM 7.5 MG PO TABS
7.5000 mg | ORAL_TABLET | Freq: Every day | ORAL | 0 refills | Status: DC
  Filled 2021-01-07: qty 30, 30d supply, fill #0

## 2021-01-07 NOTE — Assessment & Plan Note (Signed)
New problem, stable over last 4 days. Unclear etiology. No konwn cardiac conditions or medications that would explain. No red flag symptoms. Will obtain CBC, CMP, BNP, TSH, UA+micro. Most likely due to dietary salt intake and increased dependent positioning. Less likely HF, hypoalbuminemia, or liver dysfunction. Will follow up labs.

## 2021-01-07 NOTE — Assessment & Plan Note (Signed)
Two new lesions, appear to be improving. Patient has never experienced before. Unclear etiology. Does not appear to be infectious in nature. Less likely pressure-related given locations (left flank, right lateral breast). Could be related to insect or spider bite but not a congruent presentation. Not likely to be related to breast malignancy given additional location on flank, but will send for her routine screening mammogram. Recommend stopping antibiotic ointment, using vaseline, and normal wound care. Return precautions covered, see AVS.

## 2021-01-24 ENCOUNTER — Other Ambulatory Visit (HOSPITAL_COMMUNITY): Payer: Self-pay

## 2021-01-24 ENCOUNTER — Other Ambulatory Visit: Payer: Self-pay | Admitting: Family Medicine

## 2021-01-24 MED ORDER — VENLAFAXINE HCL ER 150 MG PO CP24
150.0000 mg | ORAL_CAPSULE | Freq: Every day | ORAL | 3 refills | Status: DC
  Filled 2021-01-24: qty 30, fill #0
  Filled 2021-01-24: qty 30, 30d supply, fill #0
  Filled 2021-03-10: qty 30, 30d supply, fill #1
  Filled 2021-04-05: qty 30, 30d supply, fill #2
  Filled 2021-05-05: qty 30, 30d supply, fill #3

## 2021-02-14 ENCOUNTER — Other Ambulatory Visit (HOSPITAL_COMMUNITY): Payer: Self-pay

## 2021-02-14 ENCOUNTER — Other Ambulatory Visit: Payer: Self-pay | Admitting: Family Medicine

## 2021-02-15 ENCOUNTER — Other Ambulatory Visit (HOSPITAL_COMMUNITY): Payer: Self-pay

## 2021-02-17 ENCOUNTER — Other Ambulatory Visit: Payer: Self-pay | Admitting: Family Medicine

## 2021-02-17 ENCOUNTER — Other Ambulatory Visit (HOSPITAL_COMMUNITY): Payer: Self-pay

## 2021-02-18 ENCOUNTER — Other Ambulatory Visit (HOSPITAL_COMMUNITY): Payer: Self-pay

## 2021-02-20 MED ORDER — MELOXICAM 7.5 MG PO TABS
7.5000 mg | ORAL_TABLET | Freq: Every day | ORAL | 0 refills | Status: DC
  Filled 2021-02-20: qty 30, 30d supply, fill #0

## 2021-02-21 ENCOUNTER — Other Ambulatory Visit (HOSPITAL_COMMUNITY): Payer: Self-pay

## 2021-02-22 ENCOUNTER — Other Ambulatory Visit (HOSPITAL_COMMUNITY): Payer: Self-pay

## 2021-02-22 ENCOUNTER — Ambulatory Visit (INDEPENDENT_AMBULATORY_CARE_PROVIDER_SITE_OTHER): Payer: Self-pay | Admitting: Family Medicine

## 2021-02-22 ENCOUNTER — Other Ambulatory Visit: Payer: Self-pay

## 2021-02-22 VITALS — BP 112/72 | HR 84 | Wt 296.0 lb

## 2021-02-22 DIAGNOSIS — G8929 Other chronic pain: Secondary | ICD-10-CM

## 2021-02-22 DIAGNOSIS — R399 Unspecified symptoms and signs involving the genitourinary system: Secondary | ICD-10-CM

## 2021-02-22 DIAGNOSIS — M549 Dorsalgia, unspecified: Secondary | ICD-10-CM

## 2021-02-22 LAB — POCT URINALYSIS DIP (MANUAL ENTRY)
Bilirubin, UA: NEGATIVE
Blood, UA: NEGATIVE
Glucose, UA: NEGATIVE mg/dL
Ketones, POC UA: NEGATIVE mg/dL
Leukocytes, UA: NEGATIVE
Nitrite, UA: NEGATIVE
Protein Ur, POC: 100 mg/dL — AB
Spec Grav, UA: >=1.03 — AB (ref 1.010–1.025)
Urobilinogen, UA: 0.2 E.U./dL
pH, UA: 6.5 (ref 5.0–8.0)

## 2021-02-22 LAB — POCT UA - MICROSCOPIC ONLY

## 2021-02-22 MED ORDER — CETIRIZINE HCL 10 MG PO TABS
10.0000 mg | ORAL_TABLET | Freq: Every day | ORAL | 11 refills | Status: DC
  Filled 2021-02-22 – 2021-10-21 (×3): qty 30, 30d supply, fill #0

## 2021-02-22 MED ORDER — FLUTICASONE PROPIONATE 50 MCG/ACT NA SUSP
2.0000 | Freq: Every day | NASAL | 2 refills | Status: DC
  Filled 2021-02-22 – 2021-10-21 (×3): qty 16, 30d supply, fill #0

## 2021-02-22 NOTE — Progress Notes (Signed)
° ° °  SUBJECTIVE:   CHIEF COMPLAINT / HPI:   [name] is a 45 yo who presents for kidney concerns.   Patient reports concern for kidney pain. States her back has been increasingly bothering which states has been ongoing for over a year. She feels this back pain is because of her kidneys. She states her back pain increased this past week because didn't have Meloxicam. She confirmed that she is not taking the Naproxen or Ibuprofen.   She also reports chlorine smelling sweat which has been going on for over a year. Does feel like she's sweating more than normal as well. States she eats a lot of well seasones food so can smell what she has been eating such as garlic, cumin States urine has been dark. States she has been drinking over 44 ounces of water a day. No pain or burning with urination. No vaginal symptoms. Pain with Bms when back is hurting.    OBJECTIVE:   BP 112/72    Pulse 84    Wt 296 lb (134.3 kg)    SpO2 98%    BMI 44.71 kg/m    Physical exam General: well appearing, NAD Cardiovascular: RRR, no murmurs Lungs: CTAB. Normal WOB Abdomen: soft, non-distended, non-tender Skin: warm, dry. No edema MSK: No erythema or edema of thoracic or lumbar regions. Tenderness to palpation midline as well as along paraspinal muscles bilaterally. Normal ROM  ASSESSMENT/PLAN:   No problem-specific Assessment & Plan notes found for this encounter.   Back pain Patient presents with over 1 year of back pain that she attributes to her kidneys hurting. Improved with Meloxicam. Also concerned about chlorine smelling sweat and dark urine, wondering if it is her kidneys and liver not functioning properly. On exam back without any erythema, edema or skin changes. Tender to palpation midline and along paraspinal muscles bilaterally in thoracic and lumbar regions. Provided reassurance that Cr and liver function have been normal. UA performed which was negative for infection or hematuria. Given dark urine  recommended increasing water intake with goal of half of body weight in ounces of water.  Discussed with patient that I think her back pain is more musculoskeletal in origin, but that I would refer her to the nephrologist per her request. If she gets the clearance from them that everything is ok with her kidneys I recommended following back up with [place] in clinic so we can discuss supportive care measures for her back pain.     Cora Collum, DO [facility]

## 2021-02-22 NOTE — Patient Instructions (Signed)
It was great seeing you today!  You came in for back pain and we will go ahead and place a referral to nephrology to rule out your kidneys causing this pain. I do suspect that this is more likely to be musculoskeletal back pain. After your nephrology appointment if everything looks good with your kidneys and you are still having back pain please give [place] a call at the clinic so we can have a visit to discuss.   Visit Reminders: - Stop by the pharmacy to pick up your prescriptions  - Continue to work on your healthy eating habits and incorporating exercise into your daily life.   Feel free to call with any questions or concerns at any time, at [phone].   Take care,  Dr. Cora Collum [facility]

## 2021-03-01 ENCOUNTER — Other Ambulatory Visit (HOSPITAL_COMMUNITY): Payer: Self-pay

## 2021-03-10 ENCOUNTER — Other Ambulatory Visit (HOSPITAL_COMMUNITY): Payer: Self-pay

## 2021-03-10 ENCOUNTER — Other Ambulatory Visit: Payer: Self-pay | Admitting: Family Medicine

## 2021-03-11 ENCOUNTER — Other Ambulatory Visit (HOSPITAL_COMMUNITY): Payer: Self-pay

## 2021-03-11 MED ORDER — MELOXICAM 7.5 MG PO TABS
7.5000 mg | ORAL_TABLET | Freq: Every day | ORAL | 3 refills | Status: DC
  Filled 2021-03-11 – 2021-03-28 (×2): qty 30, 30d supply, fill #0
  Filled 2021-05-05: qty 30, 30d supply, fill #1
  Filled 2021-06-16: qty 30, 30d supply, fill #2
  Filled 2021-07-19: qty 30, 30d supply, fill #3

## 2021-03-15 ENCOUNTER — Other Ambulatory Visit: Payer: Self-pay | Admitting: Family Medicine

## 2021-03-15 DIAGNOSIS — M549 Dorsalgia, unspecified: Secondary | ICD-10-CM

## 2021-03-17 ENCOUNTER — Other Ambulatory Visit: Payer: Self-pay | Admitting: *Deleted

## 2021-03-17 DIAGNOSIS — E119 Type 2 diabetes mellitus without complications: Secondary | ICD-10-CM

## 2021-03-17 DIAGNOSIS — Z794 Long term (current) use of insulin: Secondary | ICD-10-CM

## 2021-03-17 MED ORDER — METFORMIN HCL ER 500 MG PO TB24: 1000.0000 mg | ORAL_TABLET | Freq: Every day | ORAL | 3 refills | Status: DC

## 2021-03-24 ENCOUNTER — Telehealth: Payer: Self-pay | Admitting: *Deleted

## 2021-03-24 NOTE — Telephone Encounter (Signed)
Pt calling in wanting dr Arby Barrette to call her about her kidneys and which dr she is supposed to see first. She hasnt heard anything. Please advise. Anesa Fronek Kennon Holter, CMA

## 2021-03-28 ENCOUNTER — Other Ambulatory Visit (HOSPITAL_COMMUNITY): Payer: Self-pay

## 2021-03-29 ENCOUNTER — Other Ambulatory Visit (HOSPITAL_COMMUNITY): Payer: Self-pay

## 2021-03-30 ENCOUNTER — Other Ambulatory Visit (HOSPITAL_COMMUNITY): Payer: Self-pay

## 2021-04-01 ENCOUNTER — Other Ambulatory Visit (HOSPITAL_COMMUNITY): Payer: Self-pay

## 2021-04-01 ENCOUNTER — Telehealth: Payer: Self-pay

## 2021-04-01 DIAGNOSIS — E119 Type 2 diabetes mellitus without complications: Secondary | ICD-10-CM

## 2021-04-01 DIAGNOSIS — Z794 Long term (current) use of insulin: Secondary | ICD-10-CM

## 2021-04-01 MED ORDER — METFORMIN HCL ER 500 MG PO TB24
1000.0000 mg | ORAL_TABLET | Freq: Every day | ORAL | 3 refills | Status: DC
  Filled 2021-04-01: qty 180, 90d supply, fill #0
  Filled 2021-08-18: qty 180, 90d supply, fill #1
  Filled 2021-12-01: qty 180, 90d supply, fill #2
  Filled 2022-02-21: qty 180, 90d supply, fill #3

## 2021-04-01 NOTE — Telephone Encounter (Signed)
Patient calls nurse line requesting a refill on metformin.   Per chart review medication was sent in on 1/12, however was set to print.   Will resend.

## 2021-04-05 ENCOUNTER — Other Ambulatory Visit (HOSPITAL_COMMUNITY): Payer: Self-pay

## 2021-04-07 ENCOUNTER — Ambulatory Visit: Payer: No Typology Code available for payment source | Admitting: Student

## 2021-04-08 ENCOUNTER — Other Ambulatory Visit (HOSPITAL_COMMUNITY): Payer: Self-pay

## 2021-04-12 ENCOUNTER — Ambulatory Visit (INDEPENDENT_AMBULATORY_CARE_PROVIDER_SITE_OTHER): Payer: Self-pay | Admitting: Family Medicine

## 2021-04-12 ENCOUNTER — Other Ambulatory Visit: Payer: Self-pay

## 2021-04-12 ENCOUNTER — Encounter: Payer: Self-pay | Admitting: Family Medicine

## 2021-04-12 ENCOUNTER — Other Ambulatory Visit (HOSPITAL_COMMUNITY): Payer: Self-pay

## 2021-04-12 VITALS — BP 128/88 | HR 85 | Wt 289.4 lb

## 2021-04-12 DIAGNOSIS — Z6841 Body Mass Index (BMI) 40.0 and over, adult: Secondary | ICD-10-CM

## 2021-04-12 DIAGNOSIS — L608 Other nail disorders: Secondary | ICD-10-CM

## 2021-04-12 MED ORDER — SEMAGLUTIDE(0.25 OR 0.5MG/DOS) 2 MG/1.5ML ~~LOC~~ SOPN
0.2500 mg | PEN_INJECTOR | SUBCUTANEOUS | 0 refills | Status: DC
  Filled 2021-04-12: qty 3, 112d supply, fill #0

## 2021-04-12 MED ORDER — SEMAGLUTIDE(0.25 OR 0.5MG/DOS) 2 MG/1.5ML ~~LOC~~ SOPN: 0.2500 mg | PEN_INJECTOR | SUBCUTANEOUS | 0 refills | Status: DC

## 2021-04-12 NOTE — Progress Notes (Addendum)
° ° °  SUBJECTIVE:   CHIEF COMPLAINT / HPI:   Ms. Poche is a 46 yo who presents for removal of the right middle finger nail. She states that In 2017 she had MRSA and had nail removed at that time. When it grew back she reports it grew to the side, and that when she injurs her nail will start to have bleeding under the skin. She states she does not like how it looks. She endorses chronic decreased sensation of finger and states she also has carpal tunnel.   Patient also reports wanting to lose weight, particularly to help with the back pain she is experiencing. She endorses trying to get in exercise and watch what she eats. She wants to know about exercise she can do at home to tone.   OBJECTIVE:   BP 128/88    Pulse 85    Wt 289 lb 6.4 oz (131.3 kg)    SpO2 100%    BMI 43.71 kg/m    General: alert, well appearing, NAD CV: RRR Resp: Normal WOB on RA  GI: soft, non distended Derm: no visible rashes or lesions Nails: R 3rd fingernail thickened and hyperpigmented. Non tender to palpation. Sensation and strength normal     ASSESSMENT/PLAN:   No problem-specific Assessment & Plan notes found for this encounter.   Melanonychia Patient with history of R middle finger abscess with extensor tendon tenosynovitis s/p I&D and nail plate removal , as well as extensor tendon tenolysis tenosynovectomy in 2017. She desires removal due to the appearance of her nail after it grew backm and the frequent bleeding when she hits it against something. Given the bleeding and hyperpigmentation, can not rule out melanoma. When nail removed will likely send for pathology. Due to time constraints, recommended being seen at Dermatology clinic which she was agreeable to.    Obesity BMI 43.71. Discussed eating balanced meals, incorporating more vegetables, and less carbs. Discussed some at home exercises to do for building muscle. Will start patient on Ozempic 0.25mg . Discussed with patient side effects, and how to  use the pen. Advised to follow up in the next 3 weeks to see how she is tolerating the medication and will increase dose at that point   Cora Collum, DO [facility]

## 2021-04-12 NOTE — Patient Instructions (Signed)
It was great seeing you today!  You came in for nail removal and we have scheduled you for our dermatology clinic this Thursday 2/9 @ 3:30pm  Feel free to call with any questions or concerns at any time, at [phone].   Take care,  Dr. Cora Collum [facility]

## 2021-04-13 ENCOUNTER — Other Ambulatory Visit (HOSPITAL_COMMUNITY): Payer: Self-pay

## 2021-04-14 ENCOUNTER — Ambulatory Visit (INDEPENDENT_AMBULATORY_CARE_PROVIDER_SITE_OTHER): Payer: Self-pay | Admitting: Family Medicine

## 2021-04-14 ENCOUNTER — Other Ambulatory Visit: Payer: Self-pay

## 2021-04-14 VITALS — BP 125/91 | HR 81 | Wt 288.0 lb

## 2021-04-14 DIAGNOSIS — L609 Nail disorder, unspecified: Secondary | ICD-10-CM | POA: Insufficient documentation

## 2021-04-14 NOTE — Progress Notes (Signed)
° ° °  SUBJECTIVE:   CHIEF COMPLAINT / HPI:   Nail Complaint [name] [name] is a 46 y.o. female with PMHx of right third finger MRSA abscess with extensor tenosynovitis in 11/2015 with nail plate removal. Since then, she has had continued pain to her finger.  Feels that her nail is growing "to the side".  She describes her back twice a week and has significant pain when she does this.  She has decreased sensation in all of her fingers due to her carpal tunnel.  States that she cannot afford carpal tunnel release surgery.  She would like her nail removed today.  PERTINENT  PMH / PSH: HTN, HLD, Bipolar I disorder   OBJECTIVE:   BP (!) 125/91    Pulse 81    Wt 288 lb (130.6 kg)    SpO2 100%    BMI 43.50 kg/m    General: NAD, pleasant, able to participate in exam Respiratory: Breathing comfortably on room air Skin: warm and dry, right hand 3rd finger with disfigured and discolored nail. Fingernail tip feels calloused and dry. Irregular nail tip. Darker longitudinal pigmentation of lateral nail consistent with melanonychia  Psych: Normal affect and mood     ASSESSMENT/PLAN:   Nail abnormality Ongoing, chronic issue since 2017.  Given her skin abnormalities with callused nail, I worry about her healing from nail removal and worry for increased risk of additional injury without her nail given her decreased sensation from her chronic and untreated carpal tunnel.  We extensively discussed the risks and benefits of proceeding and at this time the risks outweigh benefits.  Recommend referral to previous hand surgeon who saw her in 2017 to discuss future options.  At this time recommend continued skin hydration with Vaseline multiple times a day. We were able to trim the nail slightly today without any complications.  -Amb referral hand surgery -Vaseline to nail tip frequently  -Try to keep nail evenly trimmed     Sabino Dick, DO [facility]

## 2021-04-14 NOTE — Patient Instructions (Addendum)
It was wonderful to see you today.  Today we talked about:  -I am sending a referral to hand surgery. They will call you for an appointment. -Today we were able to trim your nail, but unfortunately cannot recommend complete removal at this time. -Remember to lubricate your nail with vaseline multiple times a day.    Thank you for choosing [facility].   Please call [phone] with any questions about today's appointment.  Please be sure to schedule follow up at the front  desk before you leave today.   Sabino Dick, DO PGY-2 Family Medicine

## 2021-04-14 NOTE — Assessment & Plan Note (Signed)
Ongoing, chronic issue since 2017.  Given her skin abnormalities with callused nail, I worry about her healing from nail removal and worry for increased risk of additional injury without her nail given her decreased sensation from her chronic and untreated carpal tunnel.  We extensively discussed the risks and benefits of proceeding and at this time the risks outweigh benefits.  Recommend referral to previous hand surgeon who saw her in 2017 to discuss future options.  At this time recommend continued skin hydration with Vaseline multiple times a day. We were able to trim the nail slightly today without any complications.  -Amb referral hand surgery -Vaseline to nail tip frequently  -Try to keep nail evenly trimmed

## 2021-05-05 ENCOUNTER — Other Ambulatory Visit (HOSPITAL_COMMUNITY): Payer: Self-pay

## 2021-05-12 ENCOUNTER — Other Ambulatory Visit: Payer: Self-pay

## 2021-05-12 ENCOUNTER — Ambulatory Visit (INDEPENDENT_AMBULATORY_CARE_PROVIDER_SITE_OTHER): Payer: Self-pay | Admitting: Family Medicine

## 2021-05-12 ENCOUNTER — Other Ambulatory Visit (HOSPITAL_COMMUNITY): Payer: Self-pay

## 2021-05-12 VITALS — BP 139/99 | HR 77 | Ht 68.0 in | Wt 288.0 lb

## 2021-05-12 DIAGNOSIS — E1165 Type 2 diabetes mellitus with hyperglycemia: Secondary | ICD-10-CM

## 2021-05-12 DIAGNOSIS — E1142 Type 2 diabetes mellitus with diabetic polyneuropathy: Secondary | ICD-10-CM

## 2021-05-12 DIAGNOSIS — Z794 Long term (current) use of insulin: Secondary | ICD-10-CM

## 2021-05-12 DIAGNOSIS — G4733 Obstructive sleep apnea (adult) (pediatric): Secondary | ICD-10-CM

## 2021-05-12 DIAGNOSIS — Z9989 Dependence on other enabling machines and devices: Secondary | ICD-10-CM

## 2021-05-12 DIAGNOSIS — E119 Type 2 diabetes mellitus without complications: Secondary | ICD-10-CM

## 2021-05-12 DIAGNOSIS — Z9189 Other specified personal risk factors, not elsewhere classified: Secondary | ICD-10-CM

## 2021-05-12 LAB — POCT GLYCOSYLATED HEMOGLOBIN (HGB A1C): HbA1c, POC (controlled diabetic range): 8 % — AB (ref 0.0–7.0)

## 2021-05-12 MED ORDER — IBUPROFEN 800 MG PO TABS
800.0000 mg | ORAL_TABLET | Freq: Three times a day (TID) | ORAL | 0 refills | Status: DC
  Filled 2021-05-12: qty 63, 21d supply, fill #0

## 2021-05-12 MED ORDER — PREGABALIN 25 MG PO CAPS
25.0000 mg | ORAL_CAPSULE | Freq: Two times a day (BID) | ORAL | 0 refills | Status: DC
  Filled 2021-05-12: qty 60, 30d supply, fill #0

## 2021-05-12 NOTE — Progress Notes (Signed)
? ?  SUBJECTIVE:  ? ?CHIEF COMPLAINT / HPI:  ? ?Chief Complaint  ?Patient presents with  ? Cold Feet  ? feet tingling   ? ? ?Tingling feet ?[name] [name] is a 46 y.o. female here for tingling in her hands and feet.  Notes that her feet has been freezing when she takes her shoes off.  There is also a burning sensation in her feet.    Symptoms are better with heat and worse with cold.  Has history of neuropathy and is not taking anything for it. ? ?Diabetes ?Reports home blood sugars 89-2 20s.  Today it was 218.  In the last months she is only used her insulin once.  She uses insulin as needed.  States that she knows when she needs to use her insulin.  Takes metformin as prescribed.  Notes that she has lost weight.  Recently, has been eating more candy. She has been eating more candy and feels like this is making her neuropathy worse.   ? ? ?PERTINENT  PMH / PSH: reviewed and updated as appropriate  ? ?OBJECTIVE:  ? ?BP (!) 139/99   Pulse 77   Ht 5\' 8"  (1.727 m)   Wt 288 lb (130.6 kg)   SpO2 100%   BMI 43.79 kg/m?   ? ?GEN: well appearing female, in no acute distress  ?CV: regular rate and rhythm ?RESP: no increased work of breathing, clear to ascultation bilaterally ?ABD: Obese abdomen ?MSK: Bilateral feet sensation grossly intact to light pinprick, DP pulses equal, hair present on the lower leg ?SKIN: warm, dry,  ?PSYCH: Flat affect, appropriate speech and behavior  ? ? ?ASSESSMENT/PLAN:  ? ?Diabetes mellitus with hyperglycemia (HCC) ?Uncontrolled.  A1c 8.0 from previous 7.1.  She is not adherent to her insulin.  Took insulin once in the last month.  Recommended discontinuing insulin and starting oral medications to lower her blood sugars however patient was resistant to this.  Decided to decrease glargine dose to 10 units daily.  Discontinue short acting insulin. Encouraged continued diet rich in vegetables and complex carbs.  Heart healthy carb modified diet. Counseled on need to continue exercising.   ? ?Polyneuropathy due to type 2 diabetes mellitus (HCC) ?Reports gabapentin made her sleepy.  Discontinue gabapentin.  We will trial pregabalin 25 mg twice daily. ? ?OSA on CPAP ?No longer has a CPAP.  Sleep study ordered. ?  ? ? ? , DO ?PGY-3, [facility] ?05/12/2021  ? ? ? ? ? ? ? ? ?

## 2021-05-12 NOTE — Patient Instructions (Addendum)
For tingling in your feet consider purchasing hot packs from the dollar store or heated socks on [employer]. We started Pregablin today.  Take your first dose 1-2 hours before bedtime to see if it makes your sleepy.  ? ?For diabetes: Decrease your long acting insulin to 10 units. Stop taking Humalog.  Look out for a phone call from the pharmacy team regarding Ozempic.   ? ?Someone should contact you about scheduling a sleep study. If you do not receive a phone call in the next 2 weeks, call us at [phone].  ? ? ? ? ? ? ?

## 2021-05-16 ENCOUNTER — Encounter: Payer: Self-pay | Admitting: Family Medicine

## 2021-05-16 DIAGNOSIS — E1142 Type 2 diabetes mellitus with diabetic polyneuropathy: Secondary | ICD-10-CM | POA: Insufficient documentation

## 2021-05-16 MED ORDER — INSULIN GLARGINE SOLOSTAR 100 UNIT/ML ~~LOC~~ SOPN: 10.0000 [IU] | PEN_INJECTOR | Freq: Every day | SUBCUTANEOUS | 1 refills | Status: DC

## 2021-05-16 NOTE — Assessment & Plan Note (Addendum)
Uncontrolled.  A1c 8.0 from previous 7.1.  She is not adherent to her insulin.  Took insulin once in the last month.  Recommended discontinuing insulin and starting oral medications to lower her blood sugars however patient was resistant to this.  Decided to decrease glargine dose to 10 units daily.  Discontinue short acting insulin. Encouraged continued diet rich in vegetables and complex carbs.  Heart healthy carb modified diet. Counseled on need to continue exercising.  ?

## 2021-05-16 NOTE — Assessment & Plan Note (Signed)
No longer has a CPAP.  Sleep study ordered. ?

## 2021-05-16 NOTE — Assessment & Plan Note (Signed)
Reports gabapentin made her sleepy.  Discontinue gabapentin.  We will trial pregabalin 25 mg twice daily. ?

## 2021-06-07 ENCOUNTER — Telehealth: Payer: Self-pay

## 2021-06-07 NOTE — Telephone Encounter (Signed)
Returned pt phone call, left call back number [phone] ?

## 2021-06-07 NOTE — Telephone Encounter (Signed)
Pt returned phone call. Provided pt pap info for ozempic, will mail application to pt's email for signature. Pt says another medication will need assistance as well but was unsure of which medication.  ?

## 2021-06-16 ENCOUNTER — Other Ambulatory Visit: Payer: Self-pay | Admitting: Family Medicine

## 2021-06-16 ENCOUNTER — Other Ambulatory Visit (HOSPITAL_COMMUNITY): Payer: Self-pay

## 2021-06-16 MED ORDER — VENLAFAXINE HCL ER 150 MG PO CP24
150.0000 mg | ORAL_CAPSULE | Freq: Every day | ORAL | 3 refills | Status: DC
  Filled 2021-06-16: qty 30, 30d supply, fill #0
  Filled 2021-07-19: qty 30, 30d supply, fill #1
  Filled 2021-08-18: qty 30, 30d supply, fill #2
  Filled 2021-09-07 – 2021-09-16 (×2): qty 30, 30d supply, fill #3

## 2021-07-02 ENCOUNTER — Other Ambulatory Visit: Payer: Self-pay | Admitting: Family Medicine

## 2021-07-02 MED ORDER — OZEMPIC (0.25 OR 0.5 MG/DOSE) 2 MG/1.5ML ~~LOC~~ SOPN: 0.2500 mg | PEN_INJECTOR | SUBCUTANEOUS | Status: DC

## 2021-07-12 ENCOUNTER — Telehealth: Payer: Self-pay

## 2021-07-12 NOTE — Telephone Encounter (Signed)
Received notification from [employer] regarding approval for OZEMPIC. Patient assistance approved from 07/08/21 to 07/02/22. ? ?A 4 MONTH SUPPLY OF MEDICATION IS PROCESSING TO SHIP TO OFFICE ? ?Phone: [phone] ? ?

## 2021-07-19 ENCOUNTER — Other Ambulatory Visit (HOSPITAL_COMMUNITY): Payer: Self-pay

## 2021-07-19 ENCOUNTER — Telehealth: Payer: Self-pay

## 2021-07-19 NOTE — Telephone Encounter (Signed)
Informed pt her ozempic is ready for pickup. ? ?5 boxes of ozempic pens (0.25mg  or 0.5mg  dose pens) are labeled and ready for pickup in med room fridge ? ?

## 2021-07-19 NOTE — Telephone Encounter (Signed)
Medication picked up.

## 2021-07-29 ENCOUNTER — Ambulatory Visit (INDEPENDENT_AMBULATORY_CARE_PROVIDER_SITE_OTHER): Payer: Self-pay | Admitting: Family Medicine

## 2021-07-29 ENCOUNTER — Other Ambulatory Visit (HOSPITAL_COMMUNITY): Payer: Self-pay

## 2021-07-29 VITALS — BP 140/80 | HR 91 | Ht 68.0 in | Wt 293.2 lb

## 2021-07-29 DIAGNOSIS — Z794 Long term (current) use of insulin: Secondary | ICD-10-CM

## 2021-07-29 DIAGNOSIS — M545 Low back pain, unspecified: Secondary | ICD-10-CM

## 2021-07-29 DIAGNOSIS — G8929 Other chronic pain: Secondary | ICD-10-CM

## 2021-07-29 DIAGNOSIS — E119 Type 2 diabetes mellitus without complications: Secondary | ICD-10-CM

## 2021-07-29 MED ORDER — ACETAMINOPHEN 500 MG PO TABS
500.0000 mg | ORAL_TABLET | Freq: Four times a day (QID) | ORAL | 0 refills | Status: DC | PRN
  Filled 2021-07-29: qty 30, 8d supply, fill #0

## 2021-07-29 NOTE — Progress Notes (Unsigned)
    SUBJECTIVE:   CHIEF COMPLAINT / HPI:   Diabetes Recently received ozempic and started on monday. Concerned about interaction with other diabetes medication. She has stopped her other diabetes medications. Denies any known side effects at this time with starting ozempic.   Low back pain Hx of lumbar arthritis and is seeing a chiropractor. She continues to have pain despite ibuprofen 800 mg and mobic. Denies fever, radiation of pain or saddle anesthesia.    PERTINENT  PMH / PSH: HTN  OBJECTIVE:   BP 140/80   Pulse 91   Ht 5\' 8"  (1.727 m)   Wt 293 lb 3.2 oz (133 kg)   BMI 44.58 kg/m   General: Appears well, no acute distress. Age appropriate. Cardiac: RRR, normal heart sounds, no murmurs Respiratory: CTAB, normal effort Abdomen: soft, nontender, nondistended MSK:  Hip:  - Inspection: No gross deformity, no swelling, erythema, or ecchymosis b/l - Palpation: TTP over lower lumbar region, specifically none over greater trochanter b/l - ROM: Normal range of motion on Flexion, extension, abduction, internal and external rotation b/l - Strength: Normal strength in all fields b/l - Neuro/vasc: NV intact distally b/l - Special Tests: Negative FABER and FADIR b/l.   Skin: Warm and dry, no rashes noted Neuro: alert and oriented, no focal deficits Psych: normal affect  ASSESSMENT/PLAN:   1. Type 2 diabetes mellitus without complication, with long-term current use of insulin (HCC) A1c 8 in march. Started ozempic a few days ago. Advised to continue other medications for this and return in June for repeat A1c.   2. Chronic bilateral low back pain without sciatica Hx of lumbar arthropathy. Sees a Restaurant manager, fast food. Last images in epic 2007. No red flags. Will stop NSAIDs since she is not seeing any relief. Will start tylenol arthritis. Consider new imaging if symptoms unresolved and develop any red flag symptoms.    Gerlene Fee, [place]

## 2021-07-29 NOTE — Patient Instructions (Addendum)
It was wonderful to see you today.  Please bring ALL of your medications with you to every visit.   Today we talked about:  -Continuing Ozempic, metformin, Humalog - Stop taking Mobic, ibuprofen.  Start Tylenol arthritis - Follow-up with Dr. Shanon Rosser to discuss ADHD and continued back pain  Please be sure to schedule follow up at the front  desk before you leave today.   Please call the clinic at [phone] if your symptoms worsen or you have any concerns. It was our pleasure to serve you.  Dr. Salvadore Dom

## 2021-07-29 NOTE — Progress Notes (Unsigned)
    SUBJECTIVE:   CHIEF COMPLAINT / HPI:   Diabetes Current Regimen: *** CBGs: ***  Last A1c: *** on ***  Denies polyuria, polydipsia, hypoglycemia *** Last Eye Exam: *** Statin: *** ACE/ARB: ***  Back pain  PERTINENT  PMH / PSH: ***  OBJECTIVE:   BP 140/80   Pulse 91   Ht 5\' 8"  (1.727 m)   Wt 293 lb 3.2 oz (133 kg)   BMI 44.58 kg/m   ***  ASSESSMENT/PLAN:   No problem-specific Assessment & Plan notes found for this encounter.     Gerlene Fee, [place]

## 2021-08-05 ENCOUNTER — Telehealth: Payer: Self-pay

## 2021-08-05 NOTE — Telephone Encounter (Signed)
Patient calls nurse line reporting continued back pain.   Patient reports she has been taking Tylenol 1 gram BID and Mobic once daily without relief. Patient reports she stopped these medications as of yesterday since they were not helping. Patient reports she took Ibuprofen 800mg  today and still rates her pain 10/10.  Patient denies any new or worrisome symptoms.   Patient advised per last visit new imaging may be needed. Patient stated, "I am paying for everything out of pocket and can not afford additional images." I mentioned scheduling a FU apt, however patient stated, "I was just there."   Patient is requesting something for pain management.   Will forward to provider who saw patient.

## 2021-08-09 ENCOUNTER — Encounter: Payer: Self-pay | Admitting: *Deleted

## 2021-08-09 NOTE — Telephone Encounter (Signed)
Patient calls nurse line again in regards to message sent last week.   Patient advised again to make an apt. Patient stated, "I don't know why I have to keep coming back in for the same problem."   Will forward to PCP and provider who saw patient.

## 2021-08-10 ENCOUNTER — Other Ambulatory Visit: Payer: Self-pay | Admitting: Family Medicine

## 2021-08-10 DIAGNOSIS — M545 Low back pain, unspecified: Secondary | ICD-10-CM

## 2021-08-11 ENCOUNTER — Other Ambulatory Visit: Payer: Self-pay

## 2021-08-11 ENCOUNTER — Other Ambulatory Visit (HOSPITAL_COMMUNITY): Payer: Self-pay

## 2021-08-11 NOTE — Telephone Encounter (Signed)
Patient calls nurse line requesting a refill on Lamictal.   Patient reports she has been getting this medication through medication assistance, however she is about to run out.   Patient asking for 1 month refill until she can reapply for assistance or whatever is needed.  Patient reports she left VM with Camille.

## 2021-08-12 ENCOUNTER — Other Ambulatory Visit (HOSPITAL_COMMUNITY): Payer: Self-pay

## 2021-08-12 MED ORDER — LAMOTRIGINE 100 MG PO TABS
200.0000 mg | ORAL_TABLET | Freq: Every day | ORAL | 0 refills | Status: DC
  Filled 2021-08-12: qty 60, 30d supply, fill #0

## 2021-08-12 NOTE — Telephone Encounter (Signed)
Patient calling again regarding this. Please advise

## 2021-08-12 NOTE — Telephone Encounter (Signed)
Patient calling again to check the status of medication refill.  

## 2021-08-15 ENCOUNTER — Telehealth: Payer: Self-pay

## 2021-08-15 ENCOUNTER — Other Ambulatory Visit (HOSPITAL_COMMUNITY): Payer: Self-pay

## 2021-08-15 NOTE — Telephone Encounter (Signed)
Returned pt's phone call.  Left voicemail informing patient she is due for renewal with the company ([employer]) for medication requested (lamictal).  Requested call back to discuss re-enrollment [phone]

## 2021-08-18 ENCOUNTER — Telehealth: Payer: Self-pay | Admitting: Physician Assistant

## 2021-08-18 ENCOUNTER — Ambulatory Visit: Payer: Self-pay

## 2021-08-18 ENCOUNTER — Ambulatory Visit (INDEPENDENT_AMBULATORY_CARE_PROVIDER_SITE_OTHER): Payer: Self-pay | Admitting: Physician Assistant

## 2021-08-18 ENCOUNTER — Other Ambulatory Visit: Payer: Self-pay | Admitting: Family Medicine

## 2021-08-18 ENCOUNTER — Other Ambulatory Visit (HOSPITAL_COMMUNITY): Payer: Self-pay

## 2021-08-18 DIAGNOSIS — M545 Low back pain, unspecified: Secondary | ICD-10-CM

## 2021-08-18 MED ORDER — METHOCARBAMOL 500 MG PO TABS
500.0000 mg | ORAL_TABLET | Freq: Every evening | ORAL | 2 refills | Status: DC | PRN
  Filled 2021-08-18: qty 20, 20d supply, fill #0

## 2021-08-18 MED ORDER — METHYLPREDNISOLONE 4 MG PO TBPK
ORAL_TABLET | ORAL | 0 refills | Status: DC
  Filled 2021-08-18: qty 21, 6d supply, fill #0

## 2021-08-18 NOTE — Telephone Encounter (Signed)
Patient came back in today and dropped off CD with Sibyl Parr asked for they will be in the black tray with a stick note on them

## 2021-08-18 NOTE — Telephone Encounter (Signed)
Felicia ( Pharmacy Tech) called from [facility] outpatient pharmacy asking to change methocarbomal to generic brand. Pt is at pharmacy. Please call Felicia back at [phone].

## 2021-08-18 NOTE — Progress Notes (Signed)
Office Visit Note   Patient: [name] [name]           Date of Birth: Apr 20, 1975           MRN: [id number] Visit Date: 08/18/2021              Requested by: Latrelle Dodrill, MD [address] [place],  [place] [address] PCP: Cora Collum, DO   Assessment & Plan: Visit Diagnoses:  1. Low back pain, unspecified back pain laterality, unspecified chronicity, unspecified whether sciatica present     Plan: Impression is chronic right-sided low back pain with right lower extremity radiculopathy.  At this point, I have discussed starting her on a steroid taper muscle relaxer as she has not tried these in the past.  Also discussed physical therapy but she is not interested as she has not had good relief from her home exercise program.  She will let us know if her symptoms do not improve with the steroid pack and muscle relaxer.  Call with concerns or questions in meantime.  Follow-Up Instructions: Return if symptoms worsen or fail to improve.   Orders:  Orders Placed This Encounter  Procedures   XR Lumbar Spine 2-3 Views   Meds ordered this encounter  Medications   methylPREDNISolone (MEDROL DOSEPAK) 4 MG TBPK tablet    Sig: Take as directed    Dispense:  21 tablet    Refill:  0   methocarbamol (ROBAXIN) 500 MG tablet    Sig: Take 1 tablet (500 mg total) by mouth at bedtime as needed for muscle spasms.    Dispense:  20 tablet    Refill:  2      Procedures: No procedures performed   Clinical Data: No additional findings.   Subjective: No chief complaint on file.   HPI patient is a pleasant 46 year old female who comes in today with right-sided low back pain for the past 10 years which is progressively worsened over the past 6 months.  She denies any injury or change in activity.  The pain she has is the right lower back and radiates down the right leg and into the foot.  She describes this as a constant ache worse with bending over as well as going from a  seated to standing position.  She denies any weakness to the right lower extremity but does note paresthesias.  No bowel or bladder change or saddle paresthesias.  She has been seen by chiropractor in the past where she has been provided with a home exercise program which has not helped.  She has tried using a heating pad as well as taking Tylenol and ibuprofen at home without relief.  No previous epidural steroid injection or facet block.  Of note, patient does have x-rays from another provider's office for which she will be bringing over at some point this week.  Review of Systems as detailed in HPI.  All others reviewed and are negative.   Objective: Vital Signs: There were no vitals taken for this visit.  Physical Exam well-developed well-nourished female no acute distress.  Alert and oriented x3.  Ortho Exam lumbar spine exam shows right-sided paraspinous tenderness.  Positive straight leg raise.  Pain with lumbar extension.  Specialty Comments:  No specialty comments available.  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 W/O OBSTRUCTION 05/03/2006   Past Medical History:  Diagnosis Date   Allergy    Anemia    Bipolar 1 disorder [facility])    Sees Dr. Ledon Snare, psychology,  [phone]   Carpal tunnel syndrome    Depression    Diabetes mellitus without complication [facility])    Family history of adverse reaction to anesthesia    mother & son have difficulty waking   Fatigue    Lung nodule seen on imaging study 09/24/2013   Sleep apnea    uses cpap    Family History  Problem Relation Age of Onset   Hypertension Mother    Diabetes Mellitus II Mother    Congestive Heart Failure Father    Hypertension Father    Diabetes Mellitus II Father     Past Surgical History:  Procedure Laterality Date   ABDOMINAL HYSTERECTOMY     CHOLECYSTECTOMY N/A 06/22/2017   Procedure: LAPAROSCOPIC CHOLECYSTECTOMY;  Surgeon: Rodman Pickle, MD;  Location: [facility] OR;  Service: General;  Laterality: N/A;   I & D EXTREMITY Right 09/14/2015   Procedure: IRRIGATION AND DEBRIDEMENT RIGHT MIDDLE FINGER;  Surgeon: Dominica Severin, MD;  Location: [facility] OR;  Service: Orthopedics;  Laterality: Right;   TUBAL LIGATION     Social History   Occupational History   Not on file  Tobacco Use   Smoking status: Never   Smokeless tobacco: Never  Substance and Sexual Activity   Alcohol use: Yes    Alcohol/week: 0.0 standard drinks of alcohol    Comment: occ   Drug use: No   Sexual activity: Not on file

## 2021-08-19 NOTE — Telephone Encounter (Signed)
Ok to do

## 2021-08-22 ENCOUNTER — Other Ambulatory Visit (HOSPITAL_COMMUNITY): Payer: Self-pay

## 2021-08-23 ENCOUNTER — Other Ambulatory Visit (HOSPITAL_COMMUNITY): Payer: Self-pay

## 2021-09-07 ENCOUNTER — Other Ambulatory Visit (HOSPITAL_COMMUNITY): Payer: Self-pay

## 2021-09-15 ENCOUNTER — Other Ambulatory Visit (HOSPITAL_COMMUNITY): Payer: Self-pay

## 2021-09-16 ENCOUNTER — Other Ambulatory Visit: Payer: Self-pay | Admitting: Family Medicine

## 2021-09-16 ENCOUNTER — Other Ambulatory Visit (HOSPITAL_COMMUNITY): Payer: Self-pay

## 2021-09-19 ENCOUNTER — Other Ambulatory Visit (HOSPITAL_COMMUNITY): Payer: Self-pay

## 2021-09-19 MED ORDER — MELOXICAM 7.5 MG PO TABS
7.5000 mg | ORAL_TABLET | Freq: Every day | ORAL | 3 refills | Status: DC
  Filled 2021-09-19: qty 30, 30d supply, fill #0
  Filled 2021-10-14: qty 30, 30d supply, fill #1
  Filled 2021-12-01: qty 30, 30d supply, fill #2
  Filled 2022-01-02: qty 30, 30d supply, fill #3

## 2021-09-19 MED ORDER — LAMOTRIGINE 100 MG PO TABS
200.0000 mg | ORAL_TABLET | Freq: Every day | ORAL | 0 refills | Status: DC
  Filled 2021-09-19: qty 60, 30d supply, fill #0

## 2021-10-03 ENCOUNTER — Telehealth: Payer: Self-pay

## 2021-10-03 NOTE — Telephone Encounter (Signed)
Patient LVM on nurse line asking if Dr. Idalia Needle had spoken with Dr. Delton See regarding increasing her Effexor dosage.   Returned call to patient. Patient is requesting that Dr.  Idalia Needle call and speak with Dr. Franchot Erichsen (psychiatrist)  He would like her opinion on increasing dosage and if patient should stay on capsules vs being switched to tablets.   Patient provided me with the following contact number for his office: [phone].  Forwarding to PCP.   Veronda Prude, RN

## 2021-10-04 ENCOUNTER — Ambulatory Visit: Payer: No Typology Code available for payment source

## 2021-10-05 ENCOUNTER — Other Ambulatory Visit (HOSPITAL_COMMUNITY): Payer: Self-pay

## 2021-10-07 ENCOUNTER — Other Ambulatory Visit: Payer: Self-pay | Admitting: Family Medicine

## 2021-10-07 ENCOUNTER — Telehealth: Payer: Self-pay

## 2021-10-07 MED ORDER — LAMOTRIGINE 100 MG PO TABS: 200.0000 mg | ORAL_TABLET | Freq: Every day | ORAL | 1 refills | Status: DC

## 2021-10-07 MED ORDER — OZEMPIC (0.25 OR 0.5 MG/DOSE) 2 MG/1.5ML ~~LOC~~ SOPN
0.5000 mg | PEN_INJECTOR | SUBCUTANEOUS | 0 refills | Status: DC
Start: 2021-10-07 — End: 2021-10-21

## 2021-10-07 NOTE — Telephone Encounter (Signed)
Patient calls nurse line in regards to Ozempic.   Patient reports she has been .25mg  for ~ 1 month now and she is ready to increase to next dose.   Patient reports some nausea, however reports "nothing that isn't manageable."   Please send in dose increase if appropriate. Patient has an apt scheduled with PCP in September.   Will forward to PCP.

## 2021-10-13 NOTE — Telephone Encounter (Signed)
Received VM from Dr. Delton See regarding patient and medication management.   He is requesting returned call at [phone] in order to discuss patient's med management further.   Veronda Prude, RN

## 2021-10-14 ENCOUNTER — Other Ambulatory Visit (HOSPITAL_COMMUNITY): Payer: Self-pay

## 2021-10-14 NOTE — Telephone Encounter (Signed)
Patient returns call to nurse line regarding previous message. Advised that Dr. Idalia Needle was out of the office this week and we would provide update upon her return.  Patient appreciative.   Veronda Prude, RN

## 2021-10-18 ENCOUNTER — Telehealth: Payer: Self-pay

## 2021-10-18 NOTE — Telephone Encounter (Signed)
10/05/21: Submitted application via online portal ([employer] patient portal) for LAMICTAL to [employer] for patient assistance.   Phone: [phone]  10/05/21: Received notification from [employer] regarding patient assistance DENIAL for LAMICTAL.  10/18/21: Called to speak with [employer] rep. Patient had duplicate accounts and missing info. application will now be reprocessed & patient should receive outcome via mail. I will follow up with [employer] as well in 2-3 business days

## 2021-10-20 NOTE — Telephone Encounter (Signed)
Faxed 1mg  dose increase to [employer] 09/28/21.  Shipment should have fulfilled 09/30/21.  Called to f/u on shipment, [employer] having issues with provider state license number & name. Will get another form completed and signed from different provider.

## 2021-10-21 ENCOUNTER — Other Ambulatory Visit: Payer: Self-pay

## 2021-10-21 ENCOUNTER — Other Ambulatory Visit: Payer: Self-pay | Admitting: Family Medicine

## 2021-10-21 ENCOUNTER — Other Ambulatory Visit (HOSPITAL_COMMUNITY): Payer: Self-pay

## 2021-10-21 ENCOUNTER — Other Ambulatory Visit: Payer: Self-pay | Admitting: Physician Assistant

## 2021-10-21 MED ORDER — SEMAGLUTIDE (1 MG/DOSE) 4 MG/3ML ~~LOC~~ SOPN: 1.0000 mg | PEN_INJECTOR | SUBCUTANEOUS | 0 refills | Status: DC

## 2021-10-21 MED ORDER — VENLAFAXINE HCL ER 225 MG PO TB24: 225.0000 mg | ORAL_TABLET | Freq: Every day | ORAL | 3 refills | Status: DC

## 2021-10-21 NOTE — Telephone Encounter (Signed)
Dr. Delton See returned phone call to nurse line. Informed that Dr. Idalia Needle sent in 225 mg of Venlafaxine.   He will follow up with patient later this month.   Veronda Prude, RN

## 2021-10-21 NOTE — Telephone Encounter (Signed)
Patient returns call to nurse line to check status of prescription.   I also called Dr. Lindaann Slough office and LVM.   Patient was unsure who was supposed to be sending in this refill. It looks like PCP sent in last refill. Please clarify if this medication will be being handled by Dr. Delton See.   Veronda Prude, RN

## 2021-10-21 NOTE — Progress Notes (Signed)
Spoke to patient on the phone about her mood and wanting to increase venlafaxine.  She spoke with her psychologist who tried to call me and we have been playing phone tag.  Patient states she has been having a lot of lows recently with her mood, and after talking to psych discussed that she probably should have had a dose increase previously.  She denies SI/HI.  We will increase venlafaxine from 150 mg to 225 mg daily.  I have follow-up already scheduled for next month, and we will check in on her mood at that time.  She is also due for a dose increase of her Ozempic for her next refill, so will send in 1 mg weekly.

## 2021-10-24 ENCOUNTER — Other Ambulatory Visit: Payer: Self-pay | Admitting: Family Medicine

## 2021-10-24 ENCOUNTER — Other Ambulatory Visit (HOSPITAL_COMMUNITY): Payer: Self-pay

## 2021-10-24 ENCOUNTER — Telehealth: Payer: Self-pay | Admitting: Family Medicine

## 2021-10-24 MED ORDER — VENLAFAXINE HCL ER 225 MG PO TB24
225.0000 mg | ORAL_TABLET | Freq: Every day | ORAL | 3 refills | Status: DC
  Filled 2021-10-24: qty 30, 30d supply, fill #0

## 2021-10-24 NOTE — Telephone Encounter (Signed)
Patient calls nurse line regarding prescription for Venlafaxine. She reports that is is much more expensive ($500) for the 225 mg capsule.   Patient states that pharmacist told her it would be cheaper to get a prescription for 150 mg tablets and 75 mg tablets.   Will forward to PCP.   Please send to [facility] pharmacy, if appropriate.   Veronda Prude, RN

## 2021-10-24 NOTE — Telephone Encounter (Signed)
Patient walked in to request refill of:  Name of Medication(s):  Lamictal Last date of OV:  07/29/21 Pharmacy: [facility]  Will route refill request to Clinic RN.  Discussed with patient policy to call pharmacy for future refills.  Also, discussed refills may take up to 48 hours to approve or deny.  [name] [name]

## 2021-10-25 ENCOUNTER — Other Ambulatory Visit (HOSPITAL_COMMUNITY): Payer: Self-pay

## 2021-10-25 MED ORDER — LAMOTRIGINE 100 MG PO TABS
200.0000 mg | ORAL_TABLET | Freq: Every day | ORAL | 0 refills | Status: DC
Start: 2021-10-25 — End: 2022-03-08
  Filled 2021-10-25 – 2022-02-03 (×2): qty 60, 30d supply, fill #0

## 2021-10-26 ENCOUNTER — Other Ambulatory Visit (HOSPITAL_COMMUNITY): Payer: Self-pay

## 2021-10-27 ENCOUNTER — Other Ambulatory Visit (HOSPITAL_COMMUNITY): Payer: Self-pay

## 2021-10-27 ENCOUNTER — Other Ambulatory Visit: Payer: Self-pay | Admitting: Family Medicine

## 2021-10-27 MED ORDER — VENLAFAXINE HCL ER 150 MG PO CP24
150.0000 mg | ORAL_CAPSULE | Freq: Every day | ORAL | 3 refills | Status: DC
  Filled 2021-10-27: qty 30, 30d supply, fill #0
  Filled 2021-12-01: qty 30, 30d supply, fill #1
  Filled 2022-01-02: qty 30, 30d supply, fill #2
  Filled 2022-02-03: qty 30, 30d supply, fill #3

## 2021-10-27 MED ORDER — VENLAFAXINE HCL ER 75 MG PO TB24
75.0000 mg | ORAL_TABLET | Freq: Every day | ORAL | 6 refills | Status: DC
  Filled 2021-10-27: qty 30, 30d supply, fill #0

## 2021-10-28 ENCOUNTER — Telehealth: Payer: Self-pay | Admitting: Family Medicine

## 2021-10-28 ENCOUNTER — Other Ambulatory Visit (HOSPITAL_COMMUNITY): Payer: Self-pay

## 2021-10-28 ENCOUNTER — Other Ambulatory Visit: Payer: Self-pay | Admitting: Family Medicine

## 2021-10-28 MED ORDER — VENLAFAXINE HCL ER 75 MG PO CP24: 75.0000 mg | ORAL_CAPSULE | Freq: Every day | ORAL | 0 refills | Status: DC

## 2021-10-28 MED ORDER — VENLAFAXINE HCL ER 75 MG PO CP24
75.0000 mg | ORAL_CAPSULE | Freq: Every day | ORAL | 0 refills | Status: DC
  Filled 2021-10-28: qty 90, 90d supply, fill #0

## 2021-10-28 NOTE — Telephone Encounter (Signed)
Patient came in stating that the prescription that was just sent in for her was not correct. She says that it was a tablet, but she needs it as a capsule.

## 2021-11-22 ENCOUNTER — Ambulatory Visit (INDEPENDENT_AMBULATORY_CARE_PROVIDER_SITE_OTHER): Payer: Self-pay | Admitting: Family Medicine

## 2021-11-22 VITALS — BP 137/83 | HR 87 | Ht 68.0 in | Wt 286.0 lb

## 2021-11-22 DIAGNOSIS — M7989 Other specified soft tissue disorders: Secondary | ICD-10-CM

## 2021-11-22 DIAGNOSIS — E1140 Type 2 diabetes mellitus with diabetic neuropathy, unspecified: Secondary | ICD-10-CM

## 2021-11-22 LAB — POCT GLYCOSYLATED HEMOGLOBIN (HGB A1C): HbA1c, POC (controlled diabetic range): 6.6 % (ref 0.0–7.0)

## 2021-11-22 NOTE — Patient Instructions (Signed)
It was great seeing you today!  Today we talked about your feet swelling, which is likely due to being on your feet more recently as well as increased salt and processed foods in your diet.  Cutting out the processed foods, and using compression socks as well as putting her feet up intermittently should be helpful.  As far as your ADHD please have your psychologist fax [place] the medical records to that we can discuss at follow-up.  Our fax number is [phone]  I will see you back after you see your psychologist, but if you need to be seen earlier than that for any new issues we're happy to fit you in, just give [place] a call!  Feel free to call with any questions or concerns at any time, at [phone].   Take care,  Dr. Shary Key [facility]

## 2021-11-22 NOTE — Progress Notes (Signed)
    SUBJECTIVE:   CHIEF COMPLAINT / HPI:   Patient presents with feet swelling for the past week. Swelling has been on and off for the last few months. Woke up yesterday and feet were swelling. Has also been on her feet more with moving and also has been eating more processed food. Has had leg swelling in the past without any known etiology  When high anxiety gets nauseous and almost throws up.  Venlfaxine was recently increased and she is feeling better   Patient wants to be treated for ADHD diagnosed in 2002. Feels she hasnt been able to accomplish tasks. Feels her high anxiety comes from not getting tasks done. Feels like she is all over the place   Left side of leg nerve pain up and down leg   PERTINENT  PMH / PSH: Hypertension, OSA, diabetes  OBJECTIVE:   BP 137/83   Pulse 87   Ht 5\' 8"  (1.727 m)   Wt 286 lb (129.7 kg)   SpO2 100%   BMI 43.49 kg/m    Physical exam General: well appearing, NAD Cardiovascular: RRR, no murmurs Lungs: CTAB. Normal WOB Abdomen: soft, non-distended, non-tender Skin: warm, dry. No edema  ASSESSMENT/PLAN:   Bilateral swelling of feet Patient with bilateral feet swelling at the end of the day, relieved when she wakes up in the morning. Potentially fluid retention due to increased salt intake and possibly venous insufficiency or lymphatic issues. Unlikely cardiac related given she has no history of cardiac conditions and no other symptoms. Recommended compression socks and elevating feet when resting.  Return precautions discussed.  Type 2 diabetes mellitus with diabetic neuropathy, unspecified (HCC) A1c 6.6 down from 8.0. Microalbumin/Cr 31 down from 51 one year ago. Continue Metformin 1000mg  daily, Ozempic 1mg  weekly, and lifestyle changes   For ADHD treatment recommended she have her psychologist send her records of her ADHD diagnosis since I do not see it in our system and we can discuss further at follow up. Patient agreeable with plan     [place]

## 2021-11-23 LAB — MICROALBUMIN / CREATININE URINE RATIO
Creatinine, Urine: 189.8 mg/dL
Microalb/Creat Ratio: 31 mg/g creat — ABNORMAL HIGH (ref 0–29)
Microalbumin, Urine: 58.9 ug/mL

## 2021-11-24 DIAGNOSIS — M7989 Other specified soft tissue disorders: Secondary | ICD-10-CM | POA: Insufficient documentation

## 2021-11-24 NOTE — Assessment & Plan Note (Signed)
Patient with bilateral feet swelling at the end of the day, relieved when she wakes up in the morning. Potentially fluid retention due to increased salt intake and possibly venous insufficiency or lymphatic issues. Unlikely cardiac related given she has no history of cardiac conditions and no other symptoms. Recommended compression socks and elevating feet when resting.  Return precautions discussed.

## 2021-11-24 NOTE — Assessment & Plan Note (Signed)
A1c 6.6 down from 8.0. Microalbumin/Cr 31 down from 51 one year ago. Continue Metformin 1000mg  daily, Ozempic 1mg  weekly, and lifestyle changes

## 2021-12-01 ENCOUNTER — Other Ambulatory Visit (HOSPITAL_COMMUNITY): Payer: Self-pay

## 2021-12-02 ENCOUNTER — Other Ambulatory Visit (HOSPITAL_COMMUNITY): Payer: Self-pay

## 2021-12-02 NOTE — Telephone Encounter (Signed)
Received notification from [place] regarding approval for [employer]. Patient assistance approved from 10/18/21 to 10/18/22.  MEDICATION SHIPS TO PT'S HOME  Phone: [phone]

## 2021-12-07 ENCOUNTER — Telehealth: Payer: Self-pay

## 2021-12-07 NOTE — Telephone Encounter (Signed)
Toy Cookey patients psychologist calls nurse line requesting to PCP in regards to patients ADHD diagnoses.   You can return his call at [phone].  Will forward to PCP.

## 2021-12-13 ENCOUNTER — Telehealth: Payer: Self-pay

## 2021-12-13 NOTE — Telephone Encounter (Signed)
Pt calling about ozempic shipment.  Informed pt her ozempic order came in today & is ready for pickup - 4 boxes of ozempic are labeled and ready in med room fridge.

## 2021-12-13 NOTE — Telephone Encounter (Signed)
Patient presents to clinic. Provided with medication per note from [place].   [name] [name] C Fumio Vandam, RN  

## 2021-12-16 ENCOUNTER — Telehealth: Payer: Self-pay | Admitting: Family Medicine

## 2021-12-16 NOTE — Telephone Encounter (Signed)
Called patient's psychologist Toy Cookey and discussed her ADHD.  He states that he had extensive diagnostic appointment for her ADHD.  As she and I discussed at our visit it seems to be that she is not able to follow-up on things, get work done, so will then lay in bed and avoid doing certain tasks.  Feels that she would benefit from a stimulant and we discussed that an extended release Adderall starting at a low dose may be beneficial.  I will discussed with patient at follow-up appointment about starting this.

## 2021-12-23 NOTE — Telephone Encounter (Signed)
Patient returns call to nurse line regarding starting medication for ADHD.   Scheduled patient appointment on 10/31 to discuss starting medication.   Talbot Grumbling, RN

## 2022-01-02 ENCOUNTER — Other Ambulatory Visit (HOSPITAL_COMMUNITY): Payer: Self-pay

## 2022-01-03 ENCOUNTER — Other Ambulatory Visit (HOSPITAL_COMMUNITY): Payer: Self-pay

## 2022-01-03 ENCOUNTER — Encounter: Payer: Self-pay | Admitting: Family Medicine

## 2022-01-03 ENCOUNTER — Ambulatory Visit (INDEPENDENT_AMBULATORY_CARE_PROVIDER_SITE_OTHER): Payer: Self-pay | Admitting: Family Medicine

## 2022-01-03 DIAGNOSIS — F900 Attention-deficit hyperactivity disorder, predominantly inattentive type: Secondary | ICD-10-CM

## 2022-01-03 DIAGNOSIS — F909 Attention-deficit hyperactivity disorder, unspecified type: Secondary | ICD-10-CM | POA: Insufficient documentation

## 2022-01-03 MED ORDER — AMPHETAMINE-DEXTROAMPHET ER 10 MG PO CP24
10.0000 mg | ORAL_CAPSULE | Freq: Every day | ORAL | 0 refills | Status: DC
  Filled 2022-01-03 – 2022-01-06 (×2): qty 30, 30d supply, fill #0

## 2022-01-03 NOTE — Progress Notes (Signed)
    SUBJECTIVE:   CHIEF COMPLAINT / HPI:   Presents to discuss starting ADHD medication.  Has been diagnosed and evaluated by her psychologist Toy Cookey whom I spoke with a couple of weeks ago.  As discussed in my phone note and previous clinic visit patient has been struggling with getting her work done, accomplishing tasks, and organization. Hoping to be able to  complete projects with less distraction. Has ideas but cant focus. Would like it sent to [facility] outpatient pharmacy and if its too expensive will see if Rosendo Gros can get it discounted   OBJECTIVE:   BP 128/80   Pulse 84   Ht 5' 8.5" (1.74 m)   Wt 285 lb 9.6 oz (129.5 kg)   SpO2 98%   BMI 42.79 kg/m    Physical exam General: well appearing, NAD Cardiovascular: RRR, no murmurs Lungs: CTAB. Normal WOB Abdomen: soft, non-distended, non-tender Skin: warm, dry. No edema  ASSESSMENT/PLAN:   No problem-specific Assessment & Plan notes found for this encounter.   ADHD After discussion with patient and her psychologist we are going to trial Adderall 10mg  XR in the morning. PDMP reviewed and appropriate. Discussed potential side effects include GI upset, palpitations, irritability, reduction in appetite, sleep impairment. Advised her to schedule follow up in 1 month to see how she is tolerating medication before I refill it. Will also check on the goals we discussed to see if the medication is helping her accomplish those. Patient agreeable with plan.   [place]

## 2022-01-03 NOTE — Patient Instructions (Signed)
It was great seeing you today!  We are starting you on Adderall 10mg  XR once daily for your ADHD. Some of the side effects include GI upset, palpitations, irritability, reduction in appetite, sleep impairment. Let me know if you are not able to tolerate the medication and we can try something different.  Please check-out at the front desk before leaving the clinic. Schedule to see me either virtual or in person in 1 month to see if the medication is helping and to see how you are tolerating it, but if you need to be seen earlier than that for any new issues we're happy to fit you in, just give [place] a call!  Visit Reminders: - Stop by the pharmacy to pick up your prescriptions  - Continue to work on your healthy eating habits and incorporating exercise into your daily life.   Feel free to call with any questions or concerns at any time, at [phone].   Take care,  Dr. Shary Key [facility]

## 2022-01-03 NOTE — Assessment & Plan Note (Signed)
After discussion with patient and her psychologist we are going to trial Adderall 10mg  XR in the morning. Discussed potential side effects include GI upset, palpitations, irritability, reduction in appetite, sleep impairment. Advised her to schedule follow up in 1 month to see how she is tolerating medication before I refill it. Will also check on the goals we discussed to see if the medication is helping her accomplish those. Patient agreeable with plan.

## 2022-01-04 ENCOUNTER — Telehealth: Payer: Self-pay

## 2022-01-04 NOTE — Telephone Encounter (Signed)
Patient called requesting possible financial assistance for adderall medication.  Informed pt there are no assistance programs for this generic medication and patient may want to look into using a [place] discount card. Pt expressed thanks and understanding.

## 2022-01-06 ENCOUNTER — Other Ambulatory Visit (HOSPITAL_COMMUNITY): Payer: Self-pay

## 2022-01-18 ENCOUNTER — Telehealth: Payer: Self-pay

## 2022-01-18 NOTE — Telephone Encounter (Signed)
Patient LVM on nurse line reporting the ineffectiveness of Adderall XR.   She reports she has been taking for several days and has not noticed a difference. She reports her coworkers have made comments in regards to the medication not working. She reports she has also reached our to her [facility] provider to advise as well.   Patient scheduled for 11/30 to discuss.

## 2022-01-19 ENCOUNTER — Other Ambulatory Visit (HOSPITAL_COMMUNITY): Payer: Self-pay

## 2022-01-19 ENCOUNTER — Other Ambulatory Visit: Payer: Self-pay | Admitting: Family Medicine

## 2022-01-20 ENCOUNTER — Other Ambulatory Visit (HOSPITAL_COMMUNITY): Payer: Self-pay

## 2022-01-20 MED ORDER — MELOXICAM 7.5 MG PO TABS
7.5000 mg | ORAL_TABLET | Freq: Every day | ORAL | 3 refills | Status: DC
Start: 2022-01-20 — End: 2022-06-05
  Filled 2022-01-20 – 2022-02-03 (×2): qty 30, 30d supply, fill #0
  Filled 2022-02-21: qty 30, 30d supply, fill #1
  Filled 2022-04-10: qty 30, 30d supply, fill #2
  Filled 2022-05-04: qty 30, 30d supply, fill #3

## 2022-01-20 MED FILL — Venlafaxine HCl Cap ER 24HR 75 MG (Base Equivalent): ORAL | 30 days supply | Qty: 30 | Fill #0 | Status: AC

## 2022-01-23 ENCOUNTER — Other Ambulatory Visit (HOSPITAL_COMMUNITY): Payer: Self-pay

## 2022-02-02 ENCOUNTER — Other Ambulatory Visit (HOSPITAL_COMMUNITY): Payer: Self-pay

## 2022-02-02 ENCOUNTER — Encounter: Payer: Self-pay | Admitting: Family Medicine

## 2022-02-02 ENCOUNTER — Ambulatory Visit (INDEPENDENT_AMBULATORY_CARE_PROVIDER_SITE_OTHER): Payer: Self-pay | Admitting: Family Medicine

## 2022-02-02 VITALS — BP 146/97 | HR 88 | Ht 68.5 in | Wt 284.4 lb

## 2022-02-02 DIAGNOSIS — F900 Attention-deficit hyperactivity disorder, predominantly inattentive type: Secondary | ICD-10-CM

## 2022-02-02 MED ORDER — AMPHETAMINE-DEXTROAMPHET ER 15 MG PO CP24
15.0000 mg | ORAL_CAPSULE | ORAL | 0 refills | Status: DC
  Filled 2022-02-02: qty 30, 30d supply, fill #0

## 2022-02-02 NOTE — Progress Notes (Addendum)
    SUBJECTIVE:   CHIEF COMPLAINT / HPI:   ADHD: On Adderall XR 10mg  but does not notice a difference with her work . States she is taking is every day at 5am but is having to take a Redbull in the middle of the day to help her focus. Sees Dr. International aid/development worker (psychology) who diagnosed her formally with ADHD. States she is seeing him every month and last saw him at the beginning of Nov. Next appointment with him coming up soon. Denies side effects of GI upset, palpitations, irritability, reduction in appetite, sleep impairment. Discussed taking medication holiday when not working.  PERTINENT  PMH / PSH: ADHD  OBJECTIVE:   BP (!) 146/97   Pulse 88   Ht 5' 8.5" (1.74 m)   Wt 284 lb 6.4 oz (129 kg)   SpO2 100%   BMI 42.61 kg/m    General: NAD, pleasant, able to participate in exam Cardiac: RRR, no murmurs. Respiratory: CTAB, normal effort, No wheezes, rales or rhonchi Abdomen: Bowel sounds present, nontender, nondistended Extremities: no edema or cyanosis. Skin: warm and dry, no rashes noted Neuro: alert, no obvious focal deficits Psych: Normal affect and mood  ASSESSMENT/PLAN:   ADHD Symptoms uncontrolled on Adderall XR 10mg  and denies side effects. Increase to Adderall XR 15mg , PDMP reviewed and appropriate. Unable to see notes from Dr. Dec (psychology) but he has been in communication with Dr. about care plan. Continue to follow with Dr. and have scheduled follow up with Dr. Delton See in 1 month for continuity of care.  BP mildly elevated, patient in foot pain and "irritated" but advised to monitor.  Dr. Shanon Rosser, DO [facility]

## 2022-02-02 NOTE — Assessment & Plan Note (Addendum)
Symptoms uncontrolled on Adderall XR 10mg  and denies side effects. Increase to Adderall XR 15mg , PDMP reviewed and appropriate. Unable to see notes from Dr. (psychology) but he has been in communication with Dr. about care plan. Continue to follow with Dr. Delton See and have scheduled follow up with Dr. Shanon Rosser in 1 month for continuity of care.

## 2022-02-02 NOTE — Patient Instructions (Signed)
It was wonderful to see you today.  Please bring ALL of your medications with you to every visit.   Today we talked about:  We increased your Adderall XR to 15mg  to be taken daily. Please follow up with Dr. as scheduled. Please follow up with Dr. Delton See in 1 month to assess how the medication is working for you.  Please follow up in 1 month with Dr. Shanon Rosser  Thank you for choosing [facility].   Please call [phone] with any questions about today's appointment.  Please be sure to schedule follow up at the front desk before you leave today.   [phone], DO Family Medicine

## 2022-02-02 NOTE — Addendum Note (Signed)
Addended by: Elberta Fortis on: 02/02/2022 02:39 PM   Modules accepted: Orders

## 2022-02-03 ENCOUNTER — Other Ambulatory Visit (HOSPITAL_COMMUNITY): Payer: Self-pay

## 2022-02-21 ENCOUNTER — Other Ambulatory Visit: Payer: Self-pay | Admitting: Family Medicine

## 2022-02-21 ENCOUNTER — Other Ambulatory Visit: Payer: Self-pay

## 2022-02-21 MED FILL — Venlafaxine HCl Cap ER 24HR 75 MG (Base Equivalent): ORAL | 30 days supply | Qty: 30 | Fill #1 | Status: AC

## 2022-02-22 ENCOUNTER — Other Ambulatory Visit (HOSPITAL_COMMUNITY): Payer: Self-pay

## 2022-02-22 MED ORDER — VENLAFAXINE HCL ER 150 MG PO CP24
150.0000 mg | ORAL_CAPSULE | Freq: Every day | ORAL | 3 refills | Status: DC
  Filled 2022-02-22: qty 30, 30d supply, fill #0
  Filled 2022-04-10: qty 30, 30d supply, fill #1
  Filled 2022-05-04: qty 30, 30d supply, fill #2
  Filled 2022-06-05: qty 30, 30d supply, fill #3

## 2022-03-08 ENCOUNTER — Telehealth: Payer: Self-pay

## 2022-03-08 ENCOUNTER — Other Ambulatory Visit (HOSPITAL_COMMUNITY): Payer: Self-pay

## 2022-03-08 ENCOUNTER — Other Ambulatory Visit: Payer: Self-pay | Admitting: Family Medicine

## 2022-03-08 MED ORDER — LAMOTRIGINE 100 MG PO TABS
200.0000 mg | ORAL_TABLET | Freq: Every day | ORAL | 0 refills | Status: DC
  Filled 2022-03-08: qty 60, 30d supply, fill #0

## 2022-03-08 NOTE — Telephone Encounter (Signed)
Patient calls nurse line reporting she has stopped taking Adderall.   She reports her last dose was was ~3 weeks ago. She reports the medication was not working for her and she remained fatigued. She reports she is back to drinking coffee and energy drinks.   She would like to discuss with her therapist before making an apt to discuss alternatives.   Will forward to PCP.

## 2022-03-30 ENCOUNTER — Telehealth: Payer: Self-pay

## 2022-03-30 NOTE — Telephone Encounter (Signed)
Informed patient her ozempic pens from [facility] are ready for pickup.  Labeled and ready in med room fridge

## 2022-04-10 ENCOUNTER — Other Ambulatory Visit: Payer: Self-pay | Admitting: Family Medicine

## 2022-04-10 ENCOUNTER — Other Ambulatory Visit (HOSPITAL_COMMUNITY): Payer: Self-pay

## 2022-04-10 MED FILL — Venlafaxine HCl Cap ER 24HR 75 MG (Base Equivalent): ORAL | 30 days supply | Qty: 30 | Fill #2 | Status: AC

## 2022-04-11 ENCOUNTER — Other Ambulatory Visit (HOSPITAL_COMMUNITY): Payer: Self-pay

## 2022-04-11 MED ORDER — LAMOTRIGINE 100 MG PO TABS
200.0000 mg | ORAL_TABLET | Freq: Every day | ORAL | 0 refills | Status: DC
  Filled 2022-04-11: qty 60, 30d supply, fill #0

## 2022-04-11 NOTE — Telephone Encounter (Signed)
Medication given to patient

## 2022-04-20 LAB — HM DIABETES EYE EXAM

## 2022-05-04 ENCOUNTER — Other Ambulatory Visit: Payer: Self-pay | Admitting: Family Medicine

## 2022-05-04 ENCOUNTER — Other Ambulatory Visit: Payer: Self-pay

## 2022-05-04 ENCOUNTER — Other Ambulatory Visit (HOSPITAL_COMMUNITY): Payer: Self-pay

## 2022-05-04 MED ORDER — VENLAFAXINE HCL ER 75 MG PO CP24
75.0000 mg | ORAL_CAPSULE | Freq: Every day | ORAL | 3 refills | Status: DC
  Filled 2022-05-04: qty 90, 90d supply, fill #0
  Filled 2022-06-05 – 2022-07-04 (×2): qty 90, 90d supply, fill #1
  Filled 2022-08-02 – 2022-09-20 (×4): qty 90, 90d supply, fill #2

## 2022-05-08 ENCOUNTER — Telehealth: Payer: Self-pay

## 2022-05-08 NOTE — Telephone Encounter (Signed)
-----   Message from [place], [place] sent at 05/08/2022 10:50 AM EST ----- Regarding: HTN - Telephone Patient attempted to be outreached by Darrall Dears, PharmD Candidate on 05/08/2022 to discuss hypertension. Left voicemail for patient to return our call at their convenience at [phone].

## 2022-05-09 ENCOUNTER — Other Ambulatory Visit (HOSPITAL_COMMUNITY): Payer: Self-pay

## 2022-05-11 ENCOUNTER — Other Ambulatory Visit: Payer: Self-pay

## 2022-05-12 ENCOUNTER — Other Ambulatory Visit (HOSPITAL_COMMUNITY): Payer: Self-pay

## 2022-05-12 ENCOUNTER — Other Ambulatory Visit: Payer: Self-pay | Admitting: Family Medicine

## 2022-05-12 MED ORDER — LAMOTRIGINE 100 MG PO TABS
200.0000 mg | ORAL_TABLET | Freq: Every day | ORAL | 0 refills | Status: DC
  Filled 2022-05-12: qty 60, 30d supply, fill #0

## 2022-05-15 ENCOUNTER — Other Ambulatory Visit (HOSPITAL_COMMUNITY): Payer: Self-pay

## 2022-05-22 ENCOUNTER — Ambulatory Visit (INDEPENDENT_AMBULATORY_CARE_PROVIDER_SITE_OTHER): Payer: Self-pay | Admitting: Family Medicine

## 2022-05-22 ENCOUNTER — Encounter: Payer: Self-pay | Admitting: Family Medicine

## 2022-05-22 ENCOUNTER — Other Ambulatory Visit (HOSPITAL_COMMUNITY): Payer: Self-pay

## 2022-05-22 VITALS — BP 110/70 | HR 75 | Wt 281.0 lb

## 2022-05-22 DIAGNOSIS — M7989 Other specified soft tissue disorders: Secondary | ICD-10-CM

## 2022-05-22 DIAGNOSIS — F900 Attention-deficit hyperactivity disorder, predominantly inattentive type: Secondary | ICD-10-CM

## 2022-05-22 DIAGNOSIS — E1140 Type 2 diabetes mellitus with diabetic neuropathy, unspecified: Secondary | ICD-10-CM

## 2022-05-22 DIAGNOSIS — Z794 Long term (current) use of insulin: Secondary | ICD-10-CM

## 2022-05-22 LAB — POCT GLYCOSYLATED HEMOGLOBIN (HGB A1C): HbA1c, POC (controlled diabetic range): 5.9 % (ref 0.0–7.0)

## 2022-05-22 MED ORDER — METHYLPHENIDATE HCL ER (OSM) 18 MG PO TBCR
18.0000 mg | EXTENDED_RELEASE_TABLET | Freq: Every day | ORAL | 0 refills | Status: DC
  Filled 2022-05-22 – 2022-05-24 (×2): qty 30, 30d supply, fill #0

## 2022-05-22 MED ORDER — METFORMIN HCL 500 MG PO TABS
1000.0000 mg | ORAL_TABLET | Freq: Every day | ORAL | 3 refills | Status: DC
  Filled 2022-05-22: qty 60, 30d supply, fill #0
  Filled 2022-06-05 – 2022-06-27 (×3): qty 60, 30d supply, fill #1
  Filled 2022-07-04: qty 60, 30d supply, fill #2
  Filled 2022-08-02 – 2022-08-28 (×2): qty 60, 30d supply, fill #3

## 2022-05-22 MED ORDER — SEMAGLUTIDE(0.25 OR 0.5MG/DOS) 2 MG/3ML ~~LOC~~ SOPN
2.0000 mg | PEN_INJECTOR | SUBCUTANEOUS | 3 refills | Status: DC
  Filled 2022-05-22 – 2022-07-04 (×2): qty 3, 28d supply, fill #0

## 2022-05-22 NOTE — Progress Notes (Unsigned)
    SUBJECTIVE:   CHIEF COMPLAINT / HPI:   Patient presents for medication discussion and swelling.   Feels the Adderall she was taking was making her sleepy. Drinking about 2 redbulls or starbucks doubleshot instead. Has ongoing issues focusing. Feels tired during the day. Last night went to bed at 12am and woke up at 5:30am. States her sleeping habits havent changed. Has a sound machine to help with sleep. Declines sleep study because she doesn't have insurance. Would like to try a different medication other than Adderall.   Feels like she has been retaining fluid for the past couple of weeks. Feels it in her feet and legs. Feels its swollen during the day and in the morning. Not feeling more SOB. No chest pain. Denies any other symptoms.    States for sciatica she has been taking Metformin which has helped. Chiropractor is working on it.    PERTINENT  PMH / PSH: Reviewed   OBJECTIVE:   BP 110/70   Pulse 75   Wt 281 lb (127.5 kg)   SpO2 98%   BMI 42.10 kg/m    Physical exam General: well appearing, NAD Cardiovascular: RRR, no murmurs Lungs: CTAB. Normal WOB Abdomen: soft, non-distended, non-tender Skin: warm, dry. No edema  ASSESSMENT/PLAN:   Diabetes mellitus (HCC) A1c today 5.9, down from 6.6 six months ago.  Currently taking Ozempic 1mg  a week, Metformin 1000mg  daily.  Tolerating Ozempic well, will increase to 2 mg a week to also help with weight loss.  Requests to go back to short acting metformin, she liked the GI side effects.  ADHD Patient felt the Adderall was making her tired.  Would like to switch medications, so we will trial Concerta 18 mg daily and can increase the dose if tolerating.  Recommended up in a couple weeks to see how she is doing with the new medication  Leg swelling Patient endorses intermittent lower extremity swelling the past couple of weeks.  Denies chest pain, shortness of breath or other symptoms.  Did not appreciate any edema on exam today.  She has had concerns about this before, labs were done which was unremarkable and attributed likely to dietary salt intake and dependent positioning.  Low suspicion for cardiac or renal causes of fluid retention. Advised her that when this does happen to elevate her legs.  Recommended keeping a log of when she notes the swelling. If persistent can consider repeating labs at follow up.     [place]

## 2022-05-22 NOTE — Patient Instructions (Signed)
It was great seeing you today!  We have switched your medication from Adderall to Concerta which she will take once daily in the morning.  We have also increased your Ozempic to 2 mg a week.  I change your metformin back to the type you are taking before.  Please let me know if this medicine does not help, or if you have any side effects such as increased nausea ,stomach upset, etc.  Please keep a record of when you notice increased swelling  Lets follow-up in a few weeks to see how you are tolerating the medicine and to check on your swelling  Visit Reminders: - Stop by the pharmacy to pick up your prescriptions  - Continue to work on your healthy eating habits and incorporating exercise into your daily life.   Feel free to call with any questions or concerns at any time, at [phone].   Take care,  Dr. Shary Key [facility]  Methylphenidate Extended-Release Tablets What is this medication? METHYLPHENIDATE (meth il FEN i date) treats attention-deficit hyperactivity disorder (ADHD). It works by improving focus and reducing impulsive behavior. It may also be used to treat narcolepsy. It works by promoting wakefulness. It belongs to a group of medications called stimulants. This medicine may be used for other purposes; ask your health care provider or pharmacist if you have questions. COMMON BRAND NAME(S): Concerta, Metadate ER, Methylin, RELEXXII, Ritalin SR What should I tell my care team before I take this medication? They need to know if you have any of these conditions: Anxiety or panic attacks Circulation problems in fingers and toes Glaucoma Heart disease or a heart defect High blood pressure History of substance use disorder Liver disease Mental health condition Motor tics, family history or diagnosis of Tourette's syndrome Seizures Stroke Suicidal thoughts, plans, or attempt by you or a family member Thyroid disease An unusual or allergic  reaction to methylphenidate, other medications, foods, dyes, or preservatives Pregnant or trying to get pregnant Breast-feeding How should I use this medication? Take this medication by mouth with water. Take it as directed on the prescription label at the same time every day. Do not crush, cut, or chew this medication. Swallow the tablets whole. You can take this medication with or without food. If you take your medication more than once a day, try to take your last dose at least 8 hours before bedtime. This well help prevent the medication from interfering with your sleep. A special MedGuide will be given to you by the pharmacist with each prescription and refill. Be sure to read this information carefully each time. Talk to your care team about the use of this medication in children. While it may be prescribed for children as young as 6 years for selected conditions, precautions do apply. Overdosage: If you think you have taken too much of this medicine contact a poison control center or emergency room at once. NOTE: This medicine is only for you. Do not share this medicine with others. What if I miss a dose? If you miss a dose, take it as soon as you can. If it is almost time for your next dose, take only that dose. Do not take double or extra doses. What may interact with this medication? Do not take this medication with any of the following: MAOIs, such as Marplan, Nardil, and Parnate Ozanimod This medication may also interact with the following: Certain medications for blood pressure, heart disease, irregular heartbeat Certain medications for depression,  anxiety, or other mental health conditions Certain medications that cause drowsiness before a procedure, such as isoflurane Linezolid Methylene blue Opioids Risperidone St. John's wort This list may not describe all possible interactions. Give your health care provider a list of all the medicines, herbs, non-prescription drugs, or  dietary supplements you use. Also tell them if you smoke, drink alcohol, or use illegal drugs. Some items may interact with your medicine. What should I watch for while using this medication? Visit your care team for regular checks on your progress. Tell your care team if your symptoms do not start to get better or if they get worse. This medication requires a new prescription from your care team every time it is filled at the pharmacy. This medication can be abused and cause your brain and body to depend on it after high doses or long term use. Your care team will assess your risk and monitor you closely during treatment. Long term use of this medication may cause your brain and body to depend on it. You may be able to take breaks from this medication during weekends, holidays, or summer vacations. Talk to your care team about what works for you. If your care team wants you to stop this medication permanently, the dose may be slowly lowered over time to reduce the risk of side effects. Tell your care team if this medication loses its effects, or if you feel you need to take more than the prescribed amount. Do not change your dose without talking to your care team. Do not take this medication close to bedtime. It may prevent you from sleeping. Loss of appetite is common when starting this medication. Eating small, frequent meals or snacks can help. Talk to your care team if appetite loss persists. Children should have height and weight checked often while taking this medication. Tell your care team right away if you notice unexplained wounds on your fingers and toes while taking this medication. You should also tell your care team if you experience numbness or pain, changes in the skin color, or sensitivity to temperature in your fingers or toes. Contact your care team right away if you have an erection that lasts longer than 4 hours or if it becomes painful. This may be a sign of a serious problem and  must be treated right away to prevent permanent damage. If you are going to need surgery, a MRI, CT, or other procedure, tell your care team that you are using this medication. You may need to stop taking this medication before the procedure. The tablet shell for some brands of this medication does not dissolve. This is normal. The tablet shell may appear whole in the stool. This is not a cause for concern. What side effects may I notice from receiving this medication? Side effects that you should report to your care team as soon as possible: Allergic reactions--skin rash, itching, hives, swelling of the face, lips, tongue, or throat Heart attack--pain or tightness in the chest, shoulders, arms, or jaw, nausea, shortness of breath, cold or clammy skin, feeling faint or lightheaded Heart rhythm changes--fast or irregular heartbeat, dizziness, feeling faint or lightheaded, chest pain, trouble breathing Increase in blood pressure Irritability, confusion, fast or irregular heartbeat, muscle stiffness, twitching muscles, sweating, high fever, seizure, chills, vomiting, diarrhea, which may be signs of serotonin syndrome Mood and behavior changes--anxiety, nervousness, confusion, hallucinations, irritability, hostility, thoughts of suicide or self-harm, worsening mood, feelings of depression Prolonged or painful erection Raynaud syndrome--cool, numb, or  painful fingers or toes that may change color from pale, to blue, to red Seizures Stroke--sudden numbness or weakness of the face, arm, or leg, trouble speaking, confusion, trouble walking, loss of balance or coordination, dizziness, severe headache, change in vision Sudden eye pain or change in vision such as blurry vision, seeing halos around lights, vision loss Side effects that usually do not require medical attention (report these to your care team if they continue or are bothersome): Dry mouth Headache Loss of appetite with weight  loss Nausea Stomach pain Trouble sleeping This list may not describe all possible side effects. Call your doctor for medical advice about side effects. You may report side effects to FDA at 1-800-FDA-1088. Where should I keep my medication? Keep out of the reach of children and pets. This medication can be abused. Keep it in a safe place to protect it from theft. Do not share it with anyone. It is only for you. Selling or giving away this medication is dangerous and against the law. Store at room temperature between 15 and 30 degrees C (59 and 86 degrees F). Protect from light and moisture. Keep container tightly closed. Get rid of any unused medication after the expiration date. This medication may cause harm and death if it is taken by other adults, children, or pets. It is important to get rid of the medication as soon as you no longer need it or it is expired. You can do this in two ways: Take the medication to a medication take-back program. Check with your pharmacy or law enforcement to find a location. If you cannot return the medication, check the label or package insert to see if the medication should be thrown out in the garbage or flushed down the toilet. If you are not sure, ask your care team. If it is safe to put it in the trash, take the medication out of the container. Mix the medication with cat litter, dirt, coffee grounds, or other unwanted substance. Seal the mixture in a bag or container. Put it in the trash. NOTE: This sheet is a summary. It may not cover all possible information. If you have questions about this medicine, talk to your doctor, pharmacist, or health care provider.  2023 Elsevier/Gold Standard (2004-03-24 00:00:00)

## 2022-05-24 ENCOUNTER — Other Ambulatory Visit (HOSPITAL_COMMUNITY): Payer: Self-pay

## 2022-05-24 NOTE — Assessment & Plan Note (Addendum)
A1c today 5.9, down from 6.6 six months ago.  Currently taking Ozempic 1mg  a week, Metformin 1000mg  daily.  Tolerating Ozempic well, will increase to 2 mg a week to also help with weight loss.  Requests to go back to short acting metformin, she liked the GI side effects.

## 2022-05-24 NOTE — Assessment & Plan Note (Signed)
Patient felt the Adderall was making her tired.  Would like to switch medications, so we will trial Concerta 18 mg daily and can increase the dose if tolerating.  Recommended up in a couple weeks to see how she is doing with the new medication

## 2022-05-24 NOTE — Assessment & Plan Note (Signed)
Patient endorses intermittent lower extremity swelling the past couple of weeks.  Denies chest pain, shortness of breath or other symptoms.  Did not appreciate any edema on exam today. She has had concerns about this before, labs were done which was unremarkable and attributed likely to dietary salt intake and dependent positioning.  Low suspicion for cardiac or renal causes of fluid retention. Advised her that when this does happen to elevate her legs.  Recommended keeping a log of when she notes the swelling. If persistent can consider repeating labs at follow up.

## 2022-06-05 ENCOUNTER — Other Ambulatory Visit (HOSPITAL_COMMUNITY): Payer: Self-pay

## 2022-06-05 ENCOUNTER — Other Ambulatory Visit: Payer: Self-pay | Admitting: Family Medicine

## 2022-06-05 ENCOUNTER — Other Ambulatory Visit: Payer: Self-pay

## 2022-06-05 MED ORDER — MELOXICAM 7.5 MG PO TABS
7.5000 mg | ORAL_TABLET | Freq: Every day | ORAL | 3 refills | Status: DC
  Filled 2022-06-05: qty 30, 30d supply, fill #0
  Filled 2022-07-04: qty 30, 30d supply, fill #1
  Filled 2022-08-02 – 2022-08-28 (×2): qty 30, 30d supply, fill #2
  Filled 2022-09-20: qty 30, 30d supply, fill #3

## 2022-06-05 MED ORDER — LAMOTRIGINE 100 MG PO TABS
200.0000 mg | ORAL_TABLET | Freq: Every day | ORAL | 0 refills | Status: DC
  Filled 2022-06-05: qty 60, 30d supply, fill #0

## 2022-06-14 ENCOUNTER — Telehealth: Payer: Self-pay

## 2022-06-14 NOTE — Telephone Encounter (Signed)
Pt left message inquiring about next ozempic shipment and possible medical financial assistance.   Called pt back, no answer. Left message informing patient her last shipment in January shouldve been for 2-3 months (but will f/u with what she rec'd). Let pt know I will request last refill for pens, since enrollment ends 07/02/22 & I will attempt to process her re-enrollment online. If unsuccessful, she will receiver an application in the mail.  Suggested patient apply for medicaid for help with medical assistance.

## 2022-06-27 ENCOUNTER — Other Ambulatory Visit (HOSPITAL_COMMUNITY): Payer: Self-pay

## 2022-07-04 ENCOUNTER — Other Ambulatory Visit (HOSPITAL_COMMUNITY): Payer: Self-pay

## 2022-07-04 ENCOUNTER — Other Ambulatory Visit: Payer: Self-pay

## 2022-07-04 ENCOUNTER — Other Ambulatory Visit: Payer: Self-pay | Admitting: Family Medicine

## 2022-07-04 NOTE — Telephone Encounter (Signed)
Submitted re-enrollment application for OZEMPIC to [employer] for patient assistance via online portal.  Phone: [phone]

## 2022-07-10 ENCOUNTER — Other Ambulatory Visit (HOSPITAL_COMMUNITY): Payer: Self-pay

## 2022-07-10 ENCOUNTER — Other Ambulatory Visit: Payer: Self-pay | Admitting: Family Medicine

## 2022-07-10 ENCOUNTER — Telehealth: Payer: Self-pay | Admitting: *Deleted

## 2022-07-10 NOTE — Telephone Encounter (Signed)
Received notification from [employer] regarding approval for OZEMPIC. Patient assistance approved from 07/06/22 to 06/30/23.  Phone: [phone]

## 2022-07-10 NOTE — Telephone Encounter (Signed)
Patient left message on referral line stating that she is ready to be set up for her sleep study.  She would like for the referral to be sent to [facility].  Will send to MD to update.  Kimiah Hibner,CMA

## 2022-07-11 ENCOUNTER — Other Ambulatory Visit (HOSPITAL_COMMUNITY): Payer: Self-pay

## 2022-07-11 ENCOUNTER — Other Ambulatory Visit: Payer: Self-pay | Admitting: Family Medicine

## 2022-07-11 DIAGNOSIS — G4733 Obstructive sleep apnea (adult) (pediatric): Secondary | ICD-10-CM

## 2022-07-11 MED ORDER — LAMOTRIGINE 100 MG PO TABS
200.0000 mg | ORAL_TABLET | Freq: Every day | ORAL | 0 refills | Status: DC
  Filled 2022-07-11: qty 60, 30d supply, fill #0

## 2022-07-18 ENCOUNTER — Other Ambulatory Visit: Payer: Self-pay | Admitting: Family Medicine

## 2022-07-18 ENCOUNTER — Other Ambulatory Visit (HOSPITAL_COMMUNITY): Payer: Self-pay

## 2022-07-18 MED ORDER — VENLAFAXINE HCL ER 150 MG PO CP24
150.0000 mg | ORAL_CAPSULE | Freq: Every day | ORAL | 3 refills | Status: DC
  Filled 2022-07-18: qty 30, 30d supply, fill #0
  Filled 2022-08-02 – 2022-08-17 (×2): qty 30, 30d supply, fill #1
  Filled 2022-09-20: qty 30, 30d supply, fill #2
  Filled 2022-10-16: qty 30, 30d supply, fill #3

## 2022-07-24 NOTE — Telephone Encounter (Signed)
Patient presents to clinic for medication shipment.   Provided with labeled medication from med room fridge.   Veronda Prude, RN

## 2022-07-28 ENCOUNTER — Telehealth: Payer: Self-pay

## 2022-07-28 NOTE — Telephone Encounter (Signed)
Patient calls nurse line requesting to make an apt.   She reports she has been experiencing SOB with exertion. She reports symptoms have been present for a "few days." She is unsure what could be causing this.  She denies any chest pains, edema, dizziness or headaches.   She was speaking in full sentences while on the phone.   We do not have any apts today, unfortunately. Patient scheduled for next available on 5/28.  Strict ED precautions given for the weekend.

## 2022-08-01 ENCOUNTER — Ambulatory Visit (INDEPENDENT_AMBULATORY_CARE_PROVIDER_SITE_OTHER): Payer: Self-pay | Admitting: Family Medicine

## 2022-08-01 ENCOUNTER — Other Ambulatory Visit (HOSPITAL_COMMUNITY): Payer: Self-pay

## 2022-08-01 VITALS — BP 129/82 | HR 88 | Ht 68.5 in | Wt 288.0 lb

## 2022-08-01 DIAGNOSIS — E1140 Type 2 diabetes mellitus with diabetic neuropathy, unspecified: Secondary | ICD-10-CM

## 2022-08-01 MED ORDER — SEMAGLUTIDE(0.25 OR 0.5MG/DOS) 2 MG/3ML ~~LOC~~ SOPN
1.5000 mg | PEN_INJECTOR | SUBCUTANEOUS | 3 refills | Status: DC
  Filled 2022-08-01: qty 3, 28d supply, fill #0
  Filled 2022-08-02: qty 3, fill #0

## 2022-08-01 NOTE — Patient Instructions (Signed)
It was great seeing you today!  Today we discussed your pain, this may be from the weight. Since you are concerned about the increase in ozempic being the only change over the past few weeks, we can decrease your dose back down to 1.5 mg weekly.   Please follow up with Dr. Idalia Needle in 1 month, we can check another A1c at that time.  As we discussed, please eat a balanced diet and stay as physically active as you can. Incorporating vegetables into at least 1-2 meals a day can really make a difference.   Please follow up at your next scheduled appointment, if anything arises between now and then, please don't hesitate to contact our office.   Thank you for allowing [place] to be a part of your medical care!  Thank you, Dr. Robyne Peers

## 2022-08-01 NOTE — Progress Notes (Signed)
    SUBJECTIVE:   CHIEF COMPLAINT / HPI:   Patient initially scheduled for shortness of breath that she was experiencing last week but this has resolved. Denies shortness of breath or chest pain. Today she wants to discuss generalized body pain that has been going on for years. She thought this was due to not having enough water, now she feels that it has gotten worse over the past 2 weeks. It feels like a muscle soreness, worst pain seems to be her whole body. She is unable to pinpoint one specific area. 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 Dose of ozempic was increased to 2 mg 2 weeks ago and she believes that this may be contributing. Seeing psychiatrist and goes to therapy, is on venlafaxine which she has been on this for years. Has been off of insulin, only on metformin.   OBJECTIVE:   BP 129/82   Pulse 88   Ht 5' 8.5" (1.74 m)   Wt 288 lb (130.6 kg)   SpO2 100%   BMI 43.15 kg/m   General: Patient well-appearing, in no acute distress. CV: RRR, no murmurs or gallops auscultated Resp: CTAB, no wheezing, rales or rhonchi noted, breathing comfortably on room air without evidence of respiratory distress MSK: no tender points noted Neuro: 5/5 UE and LE strength, gross sensation intact, normal tone, normal gait   ASSESSMENT/PLAN:   Diabetes mellitus (HCC) -generalized pain possibly secondary to obesity, unsure of connection to ozempic although since this seems to be the only new change, we will go back down on ozempic to 1.5 mg weekly -recent A1c 5.9, at goal and reassurance provided. Congratulated patient on her hard work. -continue remainder of DM regimen -extensively discussed importance of lifestyle modifications which she seems to be doing a lot of as well, diet and exercise counseling and handout provided -follow up in 1 month with PCP for repeat A1c      [name] Getty Robyne Peers, DO [facility]

## 2022-08-01 NOTE — Assessment & Plan Note (Signed)
-  generalized pain possibly secondary to obesity, unsure of connection to ozempic although since this seems to be the only new change, we will go back down on ozempic to 1.5 mg weekly -recent A1c 5.9, at goal and reassurance provided. Congratulated patient on her hard work. -continue remainder of DM regimen -extensively discussed importance of lifestyle modifications which she seems to be doing a lot of as well, diet and exercise counseling and handout provided -follow up in 1 month with PCP for repeat A1c

## 2022-08-02 ENCOUNTER — Other Ambulatory Visit (HOSPITAL_COMMUNITY): Payer: Self-pay

## 2022-08-02 ENCOUNTER — Other Ambulatory Visit: Payer: Self-pay

## 2022-08-02 ENCOUNTER — Other Ambulatory Visit: Payer: Self-pay | Admitting: Family Medicine

## 2022-08-02 MED ORDER — METHYLPHENIDATE HCL ER (OSM) 18 MG PO TBCR
18.0000 mg | EXTENDED_RELEASE_TABLET | Freq: Every day | ORAL | 0 refills | Status: DC
  Filled 2022-08-02: qty 30, 30d supply, fill #0

## 2022-08-02 MED ORDER — LAMOTRIGINE 100 MG PO TABS
200.0000 mg | ORAL_TABLET | Freq: Every day | ORAL | 0 refills | Status: DC
  Filled 2022-08-02 – 2022-08-17 (×2): qty 60, 30d supply, fill #0

## 2022-08-14 ENCOUNTER — Other Ambulatory Visit (HOSPITAL_COMMUNITY): Payer: Self-pay

## 2022-08-15 ENCOUNTER — Other Ambulatory Visit (HOSPITAL_COMMUNITY): Payer: Self-pay

## 2022-08-17 ENCOUNTER — Other Ambulatory Visit: Payer: Self-pay

## 2022-08-17 ENCOUNTER — Other Ambulatory Visit (HOSPITAL_COMMUNITY): Payer: Self-pay

## 2022-08-28 ENCOUNTER — Other Ambulatory Visit: Payer: Self-pay

## 2022-08-28 ENCOUNTER — Other Ambulatory Visit (HOSPITAL_COMMUNITY): Payer: Self-pay

## 2022-08-28 ENCOUNTER — Ambulatory Visit (INDEPENDENT_AMBULATORY_CARE_PROVIDER_SITE_OTHER): Payer: Self-pay | Admitting: Family Medicine

## 2022-08-28 ENCOUNTER — Encounter: Payer: Self-pay | Admitting: Family Medicine

## 2022-08-28 VITALS — BP 130/104 | HR 93 | Ht 68.0 in | Wt 288.8 lb

## 2022-08-28 DIAGNOSIS — M545 Low back pain, unspecified: Secondary | ICD-10-CM

## 2022-08-28 DIAGNOSIS — E11628 Type 2 diabetes mellitus with other skin complications: Secondary | ICD-10-CM

## 2022-08-28 DIAGNOSIS — G8929 Other chronic pain: Secondary | ICD-10-CM

## 2022-08-28 DIAGNOSIS — Z794 Long term (current) use of insulin: Secondary | ICD-10-CM

## 2022-08-28 LAB — POCT GLYCOSYLATED HEMOGLOBIN (HGB A1C): HbA1c, POC (controlled diabetic range): 6.2 % (ref 0.0–7.0)

## 2022-08-28 MED ORDER — LAMOTRIGINE 100 MG PO TABS
200.0000 mg | ORAL_TABLET | Freq: Every day | ORAL | 2 refills | Status: DC
  Filled 2022-08-28 – 2022-09-20 (×2): qty 60, 30d supply, fill #0
  Filled 2022-10-16: qty 60, 30d supply, fill #1
  Filled 2022-11-15: qty 60, 30d supply, fill #2

## 2022-08-28 NOTE — Progress Notes (Unsigned)
    SUBJECTIVE:   CHIEF COMPLAINT / HPI:   Patient wants to talk about weight and back pain  Still having a lot of back pain and body pain. No injury or fall. Has followed with Ortho in the past but pays out of pocket so likely unable to afford it. Sees a chiropractor for her back. States pain has been going on for a couple of months. Started exercises today. Concerned because she has gained weight despite being on Ozempic.    PERTINENT  PMH / PSH: Reviewed   OBJECTIVE:   BP (!) 130/104   Pulse 93   Ht 5\' 8"  (1.727 m)   Wt 288 lb 12.8 oz (131 kg)   SpO2 100%   BMI 43.91 kg/m    Physical exam General: well appearing, NAD Cardiovascular: RRR, no murmurs Lungs: CTAB. Normal WOB Abdomen: soft, non-distended, non-tender Skin: warm, dry. No edema MSK Lumbar spine: no gross deformity or asymmetry, swelling or ecchymosis. No TTP over the spinous processes. Negative straight leg raise   ASSESSMENT/PLAN:   Diabetes mellitus (HCC) A1c 6.2 slightly up from 5.9 three months ago. Currently taking Ozempic 2mg  a week, Metformin 1000mg  daily.  Will continue current regimen. If weight loss continues to stall may want to consider Mounjaro in the future.   Back pain Suspect weight contributing and deconditioning. No red flag symptoms. Discussed with patient. Declined PT so recommended exercises at home and keeping nutrition log. Refilled Meloxicam. Return precautions discussed.    Refilled Lamictal per patient request   Cora Collum, DO [facility]

## 2022-08-28 NOTE — Patient Instructions (Addendum)
It was great seeing you today!  Im sorry you have still have been having back pain. You have refills of the Meloxicam at the pharmacy  Continue exercises, and logging your food.  Please check-out at the front desk before leaving the clinic. Return to see your new PCP in the next couple of weeks and bring all your medications with you at that time, but if you need to be seen earlier than that for any new issues we're happy to fit you in, just give [place] a call!  Visit Reminders: - Stop by the pharmacy to pick up your prescriptions  - Continue to work on your healthy eating habits and incorporating exercise into your daily life.   Feel free to call with any questions or concerns at any time, at [phone].   Take care,  Dr. Cora Collum [facility]

## 2022-08-29 DIAGNOSIS — M549 Dorsalgia, unspecified: Secondary | ICD-10-CM | POA: Insufficient documentation

## 2022-08-29 NOTE — Assessment & Plan Note (Signed)
A1c 6.2 slightly up from 5.9 three months ago. Currently taking Ozempic 2mg  a week, Metformin 1000mg  daily.  Will continue current regimen. If weight loss continues to stall may want to consider Mounjaro in the future.

## 2022-08-29 NOTE — Assessment & Plan Note (Addendum)
Suspect weight contributing and deconditioning. No red flag symptoms. Discussed with patient. Declined PT so recommended exercises at home and keeping nutrition log. Refilled Meloxicam. Return precautions discussed.

## 2022-09-20 ENCOUNTER — Other Ambulatory Visit (HOSPITAL_COMMUNITY): Payer: Self-pay

## 2022-09-20 ENCOUNTER — Other Ambulatory Visit: Payer: Self-pay | Admitting: Family Medicine

## 2022-09-20 ENCOUNTER — Other Ambulatory Visit: Payer: Self-pay

## 2022-09-21 ENCOUNTER — Other Ambulatory Visit (HOSPITAL_COMMUNITY): Payer: Self-pay

## 2022-09-21 MED ORDER — METFORMIN HCL 500 MG PO TABS
1000.0000 mg | ORAL_TABLET | Freq: Every day | ORAL | 3 refills | Status: DC
Start: 2022-09-21 — End: 2023-01-05
  Filled 2022-09-21: qty 60, 30d supply, fill #0
  Filled 2022-10-16: qty 60, 30d supply, fill #1
  Filled 2022-11-21: qty 60, 30d supply, fill #2
  Filled 2023-01-03: qty 60, 30d supply, fill #3

## 2022-09-27 ENCOUNTER — Encounter: Payer: Self-pay | Admitting: Family Medicine

## 2022-09-27 ENCOUNTER — Ambulatory Visit (INDEPENDENT_AMBULATORY_CARE_PROVIDER_SITE_OTHER): Payer: Self-pay | Admitting: Family Medicine

## 2022-09-27 ENCOUNTER — Other Ambulatory Visit: Payer: Self-pay

## 2022-09-27 VITALS — BP 135/79 | HR 91 | Ht 68.0 in | Wt 286.2 lb

## 2022-09-27 DIAGNOSIS — Z794 Long term (current) use of insulin: Secondary | ICD-10-CM

## 2022-09-27 DIAGNOSIS — E11628 Type 2 diabetes mellitus with other skin complications: Secondary | ICD-10-CM

## 2022-09-27 NOTE — Assessment & Plan Note (Signed)
Diabetes longstanding and currently appears well controlled with use of Ozempic (semaglutide)  2mg  weekly and metformin.  Patient has been experiencing symptoms which may be true or relative hypoglycemia however no glucose readings verify her glucose at the time of symptoms.  -Decreased dose of GLP-1  Ozempic (semaglutide)  with next dose.  Reduce from 2mg  to ~ 1mg  with use of ~ 20 clicks on the 2mg  pen patient has in her possession currently.  -Continued metformin 1000mg  daily.  - Discussed symptoms may be related to dose of Ozempic.  -Extensively discussed pathophysiology of diabetes, recommended lifestyle interventions, dietary effects on blood sugar control.  -Counseled on s/sx of and management of hypoglycemia.  - Patient will return in 5 days for CGM brief placement to allow interpretation of symptoms throughout the day with symptom correlation.  Sample sensor placement short-term planned.  Patient has new phone - reader not needed.

## 2022-09-27 NOTE — Progress Notes (Signed)
   Asked by Dr. McDiarmid to evaluate and manage medication related issue.     S:     Chief Complaint  Patient presents with   Follow-up    ozempic   47 y.o. female who presents for diabetes evaluation, education, and management. Patient arrives in fair spirits and presents without any assistance.   I have seen this patient in the past.   PMH is significant for diabetes.  Recently Ozempic (semaglutide) was increased from 1mg  to 2mg  weekly.  She gets this medication from the [facility] program.   Current diabetes medications include: Metformin 1000mg  daily and Ozempic (semaglutide) 2mg  weekly on Friday.   Patient reports hypoglycemic symptoms and events.  She has not been checking glucose readings to assess level of glucose with the symptoms.   States she has supplies for this checking blood glucose.   She is willing to use CGM for evaluation.    O:   Review of Systems  Constitutional:  Positive for malaise/fatigue.  Gastrointestinal:  Positive for nausea.    Physical Exam Constitutional:      Appearance: Normal appearance.  Pulmonary:     Effort: Pulmonary effort is normal.  Neurological:     Mental Status: She is alert.  Psychiatric:        Behavior: Behavior normal.        Thought Content: Thought content normal.        Judgment: Judgment normal.   PHQ9 - 3  Lab Results  Component Value Date   HGBA1C 6.2 08/28/2022   Vitals:   09/27/22 1619  BP: 135/79  Pulse: 91  SpO2: 100%    A/P: Diabetes longstanding and currently appears well controlled with use of Ozempic (semaglutide)  2mg  weekly and metformin.  Patient has been experiencing symptoms which may be true or relative hypoglycemia however no glucose readings verify her glucose at the time of symptoms.  -Decreased dose of GLP-1  Ozempic (semaglutide)  with next dose.  Reduce from 2mg  to ~ 1mg  with use of ~ 20 clicks on the 2mg  pen patient has in her possession currently.  -Continued metformin 1000mg  daily.  -  Discussed symptoms may be related to dose of Ozempic.  -Extensively discussed pathophysiology of diabetes, recommended lifestyle interventions, dietary effects on blood sugar control.  -Counseled on s/sx of and management of hypoglycemia.  - Patient will return in 5 days for CGM brief placement to allow interpretation of symptoms throughout the day with symptom correlation.  Sample sensor placement short-term planned.  Patient has new phone - reader not needed.   Written patient instructions provided. Patient verbalized understanding of treatment plan.  Total time in face to face counseling 24 minutes.    Follow-up:  Pharmacist 5 days. PCP clinic visit in PRN.

## 2022-09-27 NOTE — Patient Instructions (Signed)
Try to avoid skipping meals over the next few days.  Try to eat something every 4-5 hours during the day.   Next dose of Ozempic (semaglutide).   -Reduce your dose to 1mg  (by using 20 clicks)  See you next Monday.

## 2022-09-28 NOTE — Progress Notes (Signed)
Reviewed and agree with Dr Koval's plan.   

## 2022-10-02 ENCOUNTER — Ambulatory Visit (INDEPENDENT_AMBULATORY_CARE_PROVIDER_SITE_OTHER): Payer: Self-pay | Admitting: Pharmacist

## 2022-10-02 VITALS — Wt 287.8 lb

## 2022-10-02 DIAGNOSIS — E1165 Type 2 diabetes mellitus with hyperglycemia: Secondary | ICD-10-CM

## 2022-10-02 DIAGNOSIS — Z794 Long term (current) use of insulin: Secondary | ICD-10-CM

## 2022-10-02 NOTE — Assessment & Plan Note (Signed)
Diabetes longstanding with improved control on GLP therapy however GI and general symptoms may be related to high dose Ozempic (semaglutide).  -Patient verbalized on-going nausea but reports she is able to eat multiple meals per day without mod/severe GI distress.  -Continue Ozempic (semaglutide) 1.5mg  (less than full 2mg  dose) - Majority of visit time spend in CGM download, education and placement. Patient established contact with sharing app.

## 2022-10-02 NOTE — Patient Instructions (Addendum)
Please continue using your CGM for the next 14 days.  We will be able to see you control with use of the the lower dose of Ozempic (semaglutide).   Next visit with new PCP.    Sensor Application If using the App, you can tap Help in the Main Menu to access an in-app tutorial on applying a Sensor. See below for instructions on how to download the app. Apply Sensors only on the back of your upper arm. If placed in other areas, the Sensor may not function properly and could give you inaccurate readings. Avoid areas with scars, moles, stretch marks, or lumps.   Select an area of skin that generally stays flat during your normal daily activities (no bending or folding). Choose a site that is at least 1 inch (2.5 cm) away from any injection sites. To prevent discomfort or skin irritation, you should select a different site other than the one most recently used. Wash application site using a plain soap, dry, and then clean with an alcohol wipe. This will help remove any oily residue that may prevent the sensor from sticking properly. Allow site to air dry before proceeding. Note: The area MUST be clean and dry, or the Sensor may not stay on for the full wear duration specified by your Sensor insert. 4. Unscrew the cap from the Sensor Applicator and set the cap aside.  5. Place the Sensor Applicator over the prepared site and push down firmly to apply the Sensor to your body. 6. Gently pull the Sensor Applicator away from your body. The Sensor should now be attached to your skin. 7. Make sure the Sensor is secure after application. Put the cap back on the Sensor Applicator. Discard the used Engineer, agricultural according to local regulations.  What If My Sensor Falls Off or What If My Sensor Isn't Working? Call [employer] Customer Care Team at [phone] Available 7 days a week from 8AM-8PM EST, excluding holidays If yo have multiple sensors fall off prior to 14 days of use, contact [facility] at [phone]   The App Download the FreeStyle [place] 3 App in your phone's app store   Load the app and select get started now Create an account  Tap scan new sensor Follow the prompts on the screen. If your sensor does not sync, try moving your phone slowly around the sensor. Phone cases may affect scanning. This will be the only time you have to scan the sensor until you apply a new sensor in 14 days.  There will be a 60 minute start up period until the app will display your glucose reading   How To Share Your Readings With [place] Once in the app, go to settings -> connected apps -> LibreView -> Enter Practice ID -> [id number]

## 2022-10-02 NOTE — Progress Notes (Signed)
    S:     Chief Complaint  Patient presents with   Medication Management    Glucose monitor   47 y.o. female who presents for diabetes evaluation, education, and management. Patient arrives in  good spirits and presents without any assistance.  Patient was referred and last seen 09/27/2022.  PMH is significant for recently well controlled diabetes with use of GLP therapy.  At last visit, dose of Ozempic (semaglutide) was reduced in attempt to minimize adverse symptoms including GI symptoms.  She was asked to return for short-term CGM placement to assess control and frequency of any true low blood glucose readings.   Current diabetes medications include: Ozempic (semaglutide) ~1.5mg  (taking 20 clicks of the 2mg  pen.   Majority of visit was for CGM education.   O:   Review of Systems  Constitutional:  Positive for malaise/fatigue.  Gastrointestinal:  Positive for nausea (mild).    Physical Exam Constitutional:      Appearance: Normal appearance.  Pulmonary:     Effort: Pulmonary effort is normal.  Neurological:     Mental Status: She is alert.  Psychiatric:        Mood and Affect: Mood normal.        Behavior: Behavior normal.        Thought Content: Thought content normal.        Judgment: Judgment normal.     7 day average blood glucose: low 100s and 90s in the AM   Lab Results  Component Value Date   HGBA1C 6.2 08/28/2022   A/P: Diabetes longstanding with improved control on GLP therapy however GI and general symptoms may be related to high dose Ozempic (semaglutide).  -Patient verbalized on-going nausea but reports she is able to eat multiple meals per day without mod/severe GI distress.  -Continue Ozempic (semaglutide) 1.5mg  (less than full 2mg  dose) - Majority of visit time spend in CGM download, education and placement. Patient established contact with sharing app.  Written patient instructions provided. Patient verbalized understanding of treatment plan.   Total time in face to face counseling 22 minutes.    Follow-up:  Pharmacist PRN. PCP clinic visit in 2 weeks to meet new provider and provide follow-up on GI symptoms with lower    dose Ozempic.

## 2022-10-04 NOTE — Progress Notes (Signed)
Reviewed and agree with Dr Koval's plan.   

## 2022-10-10 ENCOUNTER — Telehealth: Payer: Self-pay | Admitting: Pharmacist

## 2022-10-10 NOTE — Telephone Encounter (Signed)
Patient contacted office late on Monday evening - stating CGM sensor had become dislodged from site and wanted assistance to replace sensor.     Call returned to patient in AM of 8/6  Patient reported she had replaced sensor.   Current Medications include: Ozempic (semaglutide) 1.5mg  (counting clicks) weekly.  Patient denies any significant medication related side effects. Review of CGM data shows 97% of glucose readings - in Target Range.     Medication Plan: -continue    Ozempic as previous.     Total time with patient call and documentation of interaction: 12 minutes.  F/U Phone call planned: None

## 2022-10-11 NOTE — Telephone Encounter (Signed)
Reviewed and agree with Dr Koval's plan.   

## 2022-10-13 ENCOUNTER — Telehealth: Payer: Self-pay | Admitting: Pharmacist

## 2022-10-13 MED ORDER — OZEMPIC (2 MG/DOSE) 8 MG/3ML ~~LOC~~ SOPN: 1.5000 mg | PEN_INJECTOR | SUBCUTANEOUS | Status: DC

## 2022-10-13 NOTE — Telephone Encounter (Signed)
Reviewed and agree with Dr Koval's plan.   

## 2022-10-13 NOTE — Telephone Encounter (Signed)
Patient called and reported she received sample sensor (#1) from the company and placed the sensor.  States it worked for ~ 2 hours then sent message of Child psychotherapist.   Since last contact patient reports less nausea but still has symptoms with low readings.    Review of her CGM report shows excellent control with GMI 6.3,  Average glucose 123 and low variability.  She has multiple readings in the 60s and has symptoms of lows (nausea) when her lows occur.  Medication Plan: -decrease to    Ozempic  from 2mg  minus 20 clicks to 2mg  minus 25 clicks.   Patient injects next dose on Saturday (tomorrow) Plan to follow-up with symptoms at PCP visit on Monday 8/13   Total time with patient call and documentation of interaction: 13 minutes.

## 2022-10-16 ENCOUNTER — Ambulatory Visit (INDEPENDENT_AMBULATORY_CARE_PROVIDER_SITE_OTHER): Payer: Self-pay | Admitting: Student

## 2022-10-16 ENCOUNTER — Other Ambulatory Visit: Payer: Self-pay

## 2022-10-16 ENCOUNTER — Other Ambulatory Visit (HOSPITAL_COMMUNITY): Payer: Self-pay

## 2022-10-16 ENCOUNTER — Encounter: Payer: Self-pay | Admitting: Student

## 2022-10-16 VITALS — BP 124/64 | HR 90 | Ht 68.0 in | Wt 282.4 lb

## 2022-10-16 DIAGNOSIS — E11628 Type 2 diabetes mellitus with other skin complications: Secondary | ICD-10-CM

## 2022-10-16 DIAGNOSIS — M545 Low back pain, unspecified: Secondary | ICD-10-CM

## 2022-10-16 DIAGNOSIS — Z794 Long term (current) use of insulin: Secondary | ICD-10-CM

## 2022-10-16 DIAGNOSIS — G8929 Other chronic pain: Secondary | ICD-10-CM

## 2022-10-16 MED ORDER — POLYETHYLENE GLYCOL 3350 17 GM/SCOOP PO POWD
17.0000 g | Freq: Every day | ORAL | 3 refills | Status: DC
Start: 2022-10-16 — End: 2023-02-22
  Filled 2022-10-16: qty 952, 56d supply, fill #0

## 2022-10-16 NOTE — Patient Instructions (Addendum)
For your back pain when you need to poop, starting with daily MiraLax is a good idea. We also really need to make sure you don't have colon cancer.   To get colon cancer screening free you can call [phone].  For the mammogram, you can apply for a scholarship with the application I'm giving you. If you have any further questions, the folks at the number above will be able to help you.   For your Ozempic, I want you to cut back to 15 clicks TOTAL. Please send me a MyChart message if you keep having lows.   Eliezer Mccoy, MD

## 2022-10-16 NOTE — Assessment & Plan Note (Signed)
Her CGM report is beautiful.  97% time in range and no lows documented there.  I do wonder if her symptoms when she hits the 60s are due to her having been so high for so long.  Obviously would not expect semaglutide to cause true lows.  I do wonder if normoglycemia is something that she will acclimate to at the time.  However, I also think it is reasonable to try cutting back a bit on the amount of Ozempic that she is getting while also being mindful she is certainly getting a weight loss benefit from this Ozempic and we do not want to attenuate this too much by decreasing her dose. -Decrease Ozempic from 20 clicks to 15 clicks of the 2 mg pen - BMP and lipid panel today. She is hesitant to start a statin without compelling evidence of need.

## 2022-10-16 NOTE — Progress Notes (Cosign Needed Addendum)
    SUBJECTIVE:   CHIEF COMPLAINT / HPI:   DM2 Well-controlled. Most recent A1c was 6.2% two months ago. Currently on metformin and Ozempic (20 clicks on the 2mg  pen, ~0.56mg /weekly). Having symptomatic lows when she hits the 60s. Becomes sweaty and shaky. Not currently on a statin. Very resistant to adding any new medication right now. "I'm already on too much."   Back Pain Only when she has to poop. No change in stool consistency or bloody BM.  Does have a daily BM but thinks she may be constipated. Asking about the utility of a "colon cleanse."  She has not had a colonoscopy due to and affordability.  She tells me that she has no insurance and is self-pay and that a colonoscopy is simply out of reach. No fevers, weakness, saddle anesthesia.   PERTINENT  PMH / PSH: DM2  OBJECTIVE:   BP 124/64   Pulse 90   Ht 5\' 8"  (1.727 m)   Wt 282 lb 6.4 oz (128.1 kg)   SpO2 100%   BMI 42.94 kg/m   Physical Exam Vitals reviewed.  Constitutional:      General: She is not in acute distress. Cardiovascular:     Rate and Rhythm: Normal rate and regular rhythm.     Heart sounds: No murmur heard. Pulmonary:     Effort: Pulmonary effort is normal. No respiratory distress.     Breath sounds: No wheezing.  Abdominal:     General: There is no distension.     Tenderness: There is no abdominal tenderness.  Musculoskeletal:        General: No swelling or deformity.     Comments: Back is free of deformity or midline tenderness.   Neurological:     General: No focal deficit present.        ASSESSMENT/PLAN:   Diabetes mellitus (HCC) Her CGM report is beautiful.  97% time in range and no lows documented there.  I do wonder if her symptoms when she hits the 60s are due to her having been so high for so long.  Obviously would not expect semaglutide to cause true lows.  I do wonder if normoglycemia is something that she will acclimate to at the time.  However, I also think it is reasonable to try  cutting back a bit on the amount of Ozempic that she is getting while also being mindful she is certainly getting a weight loss benefit from this Ozempic and we do not want to attenuate this too much by decreasing her dose. -Decrease Ozempic from 20 clicks to 15 clicks of the 2 mg pen - BMP and lipid panel today. She is hesitant to start a statin without compelling evidence of need.   Back pain The temporal association with bowel movements is curious and points to constipation as a likely contributor here. Ideally would get a colonoscopy. She has her reservations about costs, therefore have connected her with the cancer center community outreach program who can assist her with a free FIT Test, though obviously a positive test would require endoscopic follow-up. Do not think she would benefit from a colon cleanse.  - MiraLax daily - She will call [facility] for FIT Testing      J Dorothyann Gibbs, MD [facility]

## 2022-10-17 ENCOUNTER — Other Ambulatory Visit (HOSPITAL_COMMUNITY): Payer: Self-pay

## 2022-10-17 ENCOUNTER — Other Ambulatory Visit: Payer: Self-pay

## 2022-10-17 MED ORDER — MELOXICAM 7.5 MG PO TABS
7.5000 mg | ORAL_TABLET | Freq: Every day | ORAL | 3 refills | Status: DC
Start: 2022-10-17 — End: 2023-03-26
  Filled 2022-10-17: qty 30, 30d supply, fill #0
  Filled 2022-12-04: qty 30, 30d supply, fill #1
  Filled 2023-01-03: qty 30, 30d supply, fill #2
  Filled 2023-02-09: qty 30, 30d supply, fill #3

## 2022-10-17 NOTE — Assessment & Plan Note (Addendum)
The temporal association with bowel movements is curious and points to constipation as a likely contributor here. Ideally would get a colonoscopy. She has her reservations about costs, therefore have connected her with the cancer center community outreach program who can assist her with a free FIT Test, though obviously a positive test would require endoscopic follow-up. Do not think she would benefit from a colon cleanse.  - MiraLax daily - She will call [facility] for FIT Testing

## 2022-11-14 ENCOUNTER — Encounter: Payer: Self-pay | Admitting: Student

## 2022-11-14 ENCOUNTER — Other Ambulatory Visit: Payer: Self-pay

## 2022-11-14 ENCOUNTER — Ambulatory Visit (INDEPENDENT_AMBULATORY_CARE_PROVIDER_SITE_OTHER): Payer: Self-pay | Admitting: Student

## 2022-11-14 VITALS — BP 119/74 | HR 82 | Wt 290.2 lb

## 2022-11-14 DIAGNOSIS — E11628 Type 2 diabetes mellitus with other skin complications: Secondary | ICD-10-CM

## 2022-11-14 DIAGNOSIS — Z6841 Body Mass Index (BMI) 40.0 and over, adult: Secondary | ICD-10-CM

## 2022-11-14 DIAGNOSIS — Z794 Long term (current) use of insulin: Secondary | ICD-10-CM

## 2022-11-14 DIAGNOSIS — M25511 Pain in right shoulder: Secondary | ICD-10-CM

## 2022-11-14 NOTE — Progress Notes (Unsigned)
    SUBJECTIVE:   CHIEF COMPLAINT / HPI:   DM2 At our last visit we bumped her down to Ozempic 15 clicks of 2mg  pen weekly.   Back Pain Wasw associated with BM. Started MiraLax last visit. Also tried to get her connected with the [facility] for free FIT testing.   Due for PAP  PERTINENT  PMH / PSH: ***  OBJECTIVE:   There were no vitals taken for this visit.  ***  ASSESSMENT/PLAN:   No problem-specific Assessment & Plan notes found for this encounter.     Eliezer Mccoy, MD [facility]

## 2022-11-14 NOTE — Patient Instructions (Addendum)
Colon Cancer Screening  Call [phone] for FIT testing  Let's go to 18 clicks weekly.   I am giving you some shoulder exercises. IF you can get free X-rays from your chiropractor, I'd love to see the images. I am fairly certain you have what is called rotator cuff impingement. This is an overuse syndrome. There is likely some local inflammation right now making things worse. If you are able to put it in a sling while at work and give it some rest, we can see if this will knock things down. If not, come back to see me and we can do an injection in there.  Call the cancer center at the number above for your FIT Testing AND call the [place] [facility] program to discuss a free Pap smear.   For weight management, I want you to download the Cronometer app and target >150g protein daily and at least 30g fiber daily. This should help with both weight and constipation. This protein number will be hard but not impossible to hit with your vegetarian diet. Beans and legumes are a MUST.  Eliezer Mccoy, MD

## 2022-11-15 ENCOUNTER — Other Ambulatory Visit (HOSPITAL_COMMUNITY): Payer: Self-pay

## 2022-11-16 DIAGNOSIS — M25511 Pain in right shoulder: Secondary | ICD-10-CM | POA: Insufficient documentation

## 2022-11-16 DIAGNOSIS — G8929 Other chronic pain: Secondary | ICD-10-CM | POA: Insufficient documentation

## 2022-11-16 NOTE — Assessment & Plan Note (Signed)
Pleased that we have eliminated the symptomatic lows that she was having! However, will continue to do fine titration of her Ozempic to try and dial in the balance between optimizing weight loss and avoiding these events. Delicate balance.  - Increase Ozempic to 18 clicks of the 2mg  pen which should be exactly 0.5mg /week - Will make some diet changes as discussed below

## 2022-11-16 NOTE — Assessment & Plan Note (Signed)
Symptom history and exam are consistent with an impingement syndrome/rotator cuff arthropathy. Do not suspect full-thickness tear at this point. A bit limited in our diagnostic options by her uninsured status, but she is going to try to get some free X Rays from her chiropractor and figure out how to get these to me. For now will focus on relative rest and rehab exercises. 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  - Home exercise program exercises given. Unfortunately expect that PT would be financially unfeasible at present

## 2022-11-16 NOTE — Assessment & Plan Note (Addendum)
Unfortunately has not tolerated uptitration of her GLP-1a. Discussed some targeted diet interventions today to include: - Tracking intake using Cronometer app - Targeting >150g protein daily - Targeting >30g fiber daily  - Short interval follow-up, will see back in just 3-4 weeks

## 2022-11-17 ENCOUNTER — Other Ambulatory Visit (HOSPITAL_COMMUNITY): Payer: Self-pay

## 2022-11-21 ENCOUNTER — Other Ambulatory Visit (HOSPITAL_COMMUNITY): Payer: Self-pay

## 2022-12-04 ENCOUNTER — Other Ambulatory Visit (HOSPITAL_COMMUNITY): Payer: Self-pay

## 2022-12-04 ENCOUNTER — Other Ambulatory Visit: Payer: Self-pay | Admitting: Family Medicine

## 2022-12-04 MED ORDER — VENLAFAXINE HCL ER 150 MG PO CP24
150.0000 mg | ORAL_CAPSULE | Freq: Every day | ORAL | 3 refills | Status: DC
  Filled 2022-12-04: qty 30, 30d supply, fill #0
  Filled 2023-01-03: qty 30, 30d supply, fill #1
  Filled 2023-01-15: qty 30, 30d supply, fill #2
  Filled 2023-03-06: qty 30, 30d supply, fill #3

## 2022-12-05 ENCOUNTER — Other Ambulatory Visit (HOSPITAL_COMMUNITY): Payer: Self-pay

## 2022-12-05 ENCOUNTER — Other Ambulatory Visit: Payer: Self-pay

## 2022-12-05 ENCOUNTER — Ambulatory Visit (INDEPENDENT_AMBULATORY_CARE_PROVIDER_SITE_OTHER): Payer: Self-pay | Admitting: Student

## 2022-12-05 ENCOUNTER — Encounter: Payer: Self-pay | Admitting: Student

## 2022-12-05 VITALS — BP 129/89 | HR 84 | Ht 68.0 in | Wt 291.4 lb

## 2022-12-05 DIAGNOSIS — E11628 Type 2 diabetes mellitus with other skin complications: Secondary | ICD-10-CM

## 2022-12-05 DIAGNOSIS — M25511 Pain in right shoulder: Secondary | ICD-10-CM

## 2022-12-05 DIAGNOSIS — Z794 Long term (current) use of insulin: Secondary | ICD-10-CM

## 2022-12-05 NOTE — Progress Notes (Unsigned)
    SUBJECTIVE:   CHIEF COMPLAINT / HPI:   Diabetes  Weight Loss Efforts We have been adjusting her Ozempic dose by clicks. She was recently reduced all the way down to 15 clicks of the 2mg  pen due to symptoms she attributed to hypoglycemia when she was using the full 2mg  dose. She has worn a CGM when she was using 20 clicks and her glucose readings were beautiful with 97% time in range, 0% low or very low. She has uptitrated herself back up to 20 clicks and continues to feel well. She is frustrated that her weight loss has stopped with the dose reduction and is motivated to titrate back up on the pen. She is hopeful to get back up to 36 clicks (1mg /week).  We discussed downloading the Cronometer app and introducing tracking at our last visit but she has not done this.  She is not on a statin. This is a long and ongoing discussion between [place]. She is aware of the potential benefits to her health but remains resistant to starting.     Right Shoulder Pain We discussed this at our last visit. Thought to be impingement syndrome/rotator cuff arthropathy without full thickness tear. She has plans to see her chiropractor later this week who she believes can get her free X Rays. Defers shoulder injection today in hopes of not clouding the clinical picture prior to her chiropractic appointment later this week.   OBJECTIVE:   BP 129/89   Pulse 84   Ht 5\' 8"  (1.727 m)   Wt 291 lb 6.4 oz (132.2 kg)   SpO2 100%   BMI 44.31 kg/m   General: alert & oriented, no apparent distress, well groomed HEENT: normocephalic, atraumatic, EOM grossly intact, oral mucosa moist, neck supple Respiratory: normal respiratory effort GI: non-distended Skin: no rashes, no jaundice Psych: appropriate mood and affect MSK: Wearing a sling for right shoulder. Remains anterior tenderness, FROM but ++painful arch on active abduction    ASSESSMENT/PLAN:   Diabetes mellitus (HCC) -Increasing her Ozempic to 36 clicks of her  2mg  pen (1mg /week).  -New CGM placed in clinic today, she will let me know if she has persistent highs or lows -F/u in 4 weeks -Revisited Cronometer app and tracking. Goals of 150g protein and 30g fiber daily. Will be hard to hit protein goals on her vegetarian diet.  -I continue to encourage a statin, she continues to decline.   Acute pain of right shoulder Symptoms basically unchanged over past three weeks. Happy to inject shoulder after she sees her chiropractor if she'd like. Hopefully she can find a way to share his XR images.    Health Maintenance Uninsured. Provided her the number for [facility] to help get her set up for FIT testing and mammogram scholarship.   Eliezer Mccoy, MD [facility]

## 2022-12-05 NOTE — Patient Instructions (Addendum)
Once you get your Xrays and such, feel free to make an appointment for a shoulder injection.   I do recommend that we start you on a statin medicine for your cholesterol. Keep thinking about this!! I'll keep reminding you.   I think trying 36 clicks (1mg /week) is very reasonable. If you have symptoms, I want you to check your blood sugars. Please let me know if you're having any highs or lows (>250 or <70), you can send me a [employer].   Colon Cancer Screening  Call [phone] for FIT testing  Mammography   [facility] [email or web address]  For weight management, I want you to download the Cronometer app and target >150g protein daily and at least 30g fiber daily. This should help with both weight and constipation. This protein number will be hard but not impossible to hit with your vegetarian diet. Beans and legumes are a MUST.   Come back to see me in 1 month.   Eliezer Mccoy, MD

## 2022-12-07 NOTE — Assessment & Plan Note (Signed)
Symptoms basically unchanged over past three weeks. Happy to inject shoulder after she sees her chiropractor if she'd like. Hopefully she can find a way to share his XR images.

## 2022-12-07 NOTE — Assessment & Plan Note (Addendum)
-  Increasing her Ozempic to 36 clicks of her 2mg  pen (1mg /week).  -New CGM placed in clinic today, she will let me know if she has persistent highs or lows -F/u in 4 weeks -Revisited Cronometer app and tracking. Goals of 150g protein and 30g fiber daily. Will be hard to hit protein goals on her vegetarian diet.  -I continue to encourage a statin, she continues to decline.

## 2022-12-13 ENCOUNTER — Other Ambulatory Visit: Payer: Self-pay

## 2022-12-14 ENCOUNTER — Other Ambulatory Visit (HOSPITAL_COMMUNITY): Payer: Self-pay

## 2022-12-14 ENCOUNTER — Other Ambulatory Visit: Payer: Self-pay | Admitting: Family Medicine

## 2022-12-15 ENCOUNTER — Other Ambulatory Visit: Payer: Self-pay

## 2022-12-15 ENCOUNTER — Other Ambulatory Visit (HOSPITAL_COMMUNITY): Payer: Self-pay

## 2022-12-15 MED ORDER — LAMOTRIGINE 100 MG PO TABS
200.0000 mg | ORAL_TABLET | Freq: Every day | ORAL | 2 refills | Status: DC
Start: 2022-12-15 — End: 2023-03-26
  Filled 2022-12-15: qty 60, 30d supply, fill #0
  Filled 2023-01-03: qty 60, 30d supply, fill #1
  Filled 2023-03-02: qty 60, 30d supply, fill #2

## 2022-12-25 ENCOUNTER — Other Ambulatory Visit (HOSPITAL_COMMUNITY): Payer: Self-pay

## 2022-12-27 ENCOUNTER — Encounter: Payer: Self-pay | Admitting: Neurology

## 2022-12-27 ENCOUNTER — Ambulatory Visit (INDEPENDENT_AMBULATORY_CARE_PROVIDER_SITE_OTHER): Payer: Self-pay | Admitting: Neurology

## 2022-12-27 VITALS — BP 128/80 | HR 76 | Ht 68.0 in | Wt 290.0 lb

## 2022-12-27 DIAGNOSIS — E662 Morbid (severe) obesity with alveolar hypoventilation: Secondary | ICD-10-CM

## 2022-12-27 DIAGNOSIS — Z6841 Body Mass Index (BMI) 40.0 and over, adult: Secondary | ICD-10-CM

## 2022-12-27 DIAGNOSIS — R0683 Snoring: Secondary | ICD-10-CM | POA: Insufficient documentation

## 2022-12-27 DIAGNOSIS — E66813 Obesity, class 3: Secondary | ICD-10-CM

## 2022-12-27 DIAGNOSIS — G4719 Other hypersomnia: Secondary | ICD-10-CM | POA: Insufficient documentation

## 2022-12-27 DIAGNOSIS — F515 Nightmare disorder: Secondary | ICD-10-CM | POA: Insufficient documentation

## 2022-12-27 DIAGNOSIS — G47419 Narcolepsy without cataplexy: Secondary | ICD-10-CM | POA: Insufficient documentation

## 2022-12-27 NOTE — Patient Instructions (Signed)
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 Large neck and BMI loud snoring. , dry mouth sometimes morning headaches.    2) nightmare sleep disorder , stress exacerbates the frequency      [name] is a patient who has struggled much of her adult life with excessive daytime sleepiness and was seen here 7 years ago for a similar set of symptoms.  She at that time surprisingly tested negative for sleep apnea in an attended polysomnography study.  Since then she has not had CPAP.  Previously she carries a diagnosis of sleep apnea and was treated with positive airway pressure therapy.   As outlined above I think she has very high risk factors for still having apnea at least obesity hypoventilation and upper airway resistance syndrome.  I like for her to undergo a home sleep test to screen for apnea.   If the AHI is 5 or above I will treat this apnea until we can see if it reduces her Epworth score or not.  Should her hypersomnia persist, I would be willing to invite her for a PSG and MSLT to follow.   The patient is to factor not a shift worker and currently she does not carries a diagnosis of obstructive sleep apnea so I am restricted in giving her stimulant medications that could help her not to fall asleep and especially make her commute safer.  Modafinil is usually used for patients but they need an underlying diagnosis. At this time I could only order generic Ritalin or generic Adderall to allow her less fatigue and less sleepiness in daytime. She stated she has failed these medications.       I plan to follow up either personally or through our NP within 3-4 months.

## 2022-12-27 NOTE — Progress Notes (Signed)
SLEEP MEDICINE CLINIC    Provider:  Melvyn Novas, MD  Primary Care Physician:  Alicia Amel, MD [address] [place] [address]     Referring Provider: Latrelle Dodrill, Md [address] [place],  [place] [address]          Chief Complaint according to [name]   [name] presents with:                HISTORY OF PRESENT ILLNESS:  [name] [name] is a 47 y.o. female [name] who is seen upon referral on 12/27/2022 from Dr Marisue Humble  for a new evaluation of sleepiness, after a nearly 7 year hiatus.  She has undergone CPAP titrations, MSLT and a split night had been ordered in 02-2016 at [facility] - she did not qualify for a new CPAP as her AHI was 0.    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    I have [name] pleasure of seeing [name] [name] on 12/27/22 a right -handed AA female with a concern of daytime sleepiness.    [name] [name] had a sleep study with me in [name] year 2017  with a result of an AHI ( Apnea Hypopnea index)  of )/h (!!!),    Sleep relevant medical history: [name] [name] is a 47 y.o. female , seen here as a [facility]  for a sleep consultation,   [name] [name] was diagnosed with obstructive sleep apnea in [facility] approximately 7 years ago and she has been on CPAP ever since. Also she has continued to use CPAP compliantly she has developed hypersomnia and she is sleepier now than she has been years before treatment. She is falling asleep uncontrollably. She has a history of morbid obesity, diabetes, depression, carpal tunnel syndrome, frequent sinusitis, allergic rhinitis and stuffy nose -and she suffered from a staph infection in Summer of this year and was treated with intravenous antibiotics for prolonged course (MRSA , three recurrences) .  Hospitalized 3 times. I do not have access to her original study but to a compliance download. [name] [name] is using a Estate agent. Her 30 day compliance shows 70% compliance but only 57% with over 4 hours of nightly use. Average usage for days on CPAP of 7 hours and 17 minutes but there were 9 days without using CPAP. This month was unusual because she had struggled with a sinus infection. [name] mean pressure on her CPAP machine is 13.8 cm water, average pressure for 90% of user time is 15.8 cm water and [name] residual average AHI is 3.3 per hour.     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 Her bedtime is early. She states she falls asleep promptly she stays asleep. She does not feel that her sleep is fragmented. She will have up to 3 nocturia breaks at night. Her bedroom is quiet, cool and dark. She prefers to sleep on either side, and uses multiple pillows. She does not report heartburn or gastric reflux. Her family reports that she still snores without using CPAP. She has struggled with frequent upper respiratory infections and allergic obstructions of [name] nasal airway.  She reports vivid dreams especially when she had a stressful day. She sometimes wakes up with a sensation that she cannot breathe. She does not report sleep paralysis, no cataplexy, but reports  dream intrusion and hypnagogic, hypnopompic hallucinations. She reports such visit life like dreams that it is hard to distinguish reality from dream. She also reports that she can be almost immediately dreaming once in bed and asleep. She wakes up spontaneously by 5 AM  (but has a back up alarm clock), and walks for exercise form 5.30 to 6.30 AM , managing to walk 1.2 miles.    Sleep medical history and family sleep history:  Father has OSA, son and daughter have OSA.  Sleep habits are as  follows: [name] [name]'s dinner time is between 6-7 PM. [name] [name] goes to bed at 7-9 PM and continues to sleep for 7-8 hours, wakes  at 5.30 AM to get ready for work. [place]er to [place] , sleepy while driving. Sleepy in church.   [name] preferred sleep position is right side, with [name] support of 3 pillows. Dreams are reportedly nightmarish- frequent/vivid.  She wakes up with a feeling of not breathing , wants to scream and can't.  She reports not feeling refreshed or restored in AM, with symptoms such as dry mouth, morning headaches, and residual fatigue.  Naps are taken unscheduled -frequently, lasting from 15 to 30 minutes and are refreshing and don't interfere with nocturnal sleep.    Review of Systems: Out of a complete 14 system review, [name] [name] complains of only [name] following symptoms, and all other reviewed systems are negative.:  Fatigue, sleepiness , snoring, Nocturia    How likely are you to doze in [name] following situations: 0 = not likely, 1 = slight chance, 2 = moderate chance, 3 = high chance   Sitting and Reading? Watching Television? Sitting inactive in a public place (theater or meeting)? As a passenger in a car for an hour without a break? Lying down in [name] afternoon when circumstances permit? Sitting and talking to someone? Sitting quietly after lunch without alcohol? In a car, while stopped for a few minutes in traffic?   Total = 21/ 24 points   FSS endorsed at 54/ 63 points.    In 2017 ESS was 17 ,  Fatigue severity score 46  , depression score 3/15    Social History   Socioeconomic History   Marital status: Single    Spouse name: Not on file   Number of children: 3   Years of education: Not on file   Highest education level: Early childhood education, 3 years.  Occupational History   Not on file  Tobacco Use   Smoking status: Never   Smokeless tobacco: Never  Substance and Sexual Activity   Alcohol use: Yes    Alcohol/week: 0.0 standard drinks of alcohol     Comment: occ   Drug use: No   Sexual activity: Not on file  Other Topics Concern   Not on file  Social History Narrative   Not on file   Social Determinants of Health   Financial Resource Strain: Not on file  Food Insecurity: Not on file  Transportation Needs: Not on file  Physical Activity: Not on file  Stress: Not on file  Social Connections: Unknown (07/19/2021)   [facility]   Social Network    Social Network: Not on file    Family History  Problem Relation Age of Onset   Hypertension  Mother    Diabetes Mellitus II Mother    Congestive Heart Failure Father    Hypertension Father    Diabetes Mellitus II  Son with Asperger's   Also with OSA  Father had OSA  Father     Past Medical History:  Diagnosis Date   Allergy    Anemia    Bipolar 1 disorder [facility])    Sees Dr. Ledon Snare, psychology,  [phone]   Carpal tunnel syndrome    Depression    Diabetes mellitus without complication [facility])    Family history of adverse reaction to anesthesia    mother & son have difficulty waking   Fatigue    Ganglion of left wrist 07/26/2018   Lung nodule seen on imaging study 09/24/2013   Sleep apnea dx was reversed in 2017 - AHI was 0 /h on test PSG         Past Surgical History:  Procedure Laterality Date   ABDOMINAL HYSTERECTOMY     CHOLECYSTECTOMY N/A 06/22/2017   Procedure: LAPAROSCOPIC CHOLECYSTECTOMY;  Surgeon: Rodman Pickle, MD;  Location: [facility] OR;  Service: General;  Laterality: N/A;   I & D EXTREMITY Right 09/14/2015   Procedure: IRRIGATION AND DEBRIDEMENT RIGHT MIDDLE FINGER;  Surgeon: Dominica Severin, MD;  Location: [facility] OR;  Service: Orthopedics;  Laterality: Right;   TUBAL LIGATION       Current Outpatient Medications on File Prior to Visit  Medication Sig Dispense Refill   cetirizine (ZYRTEC) 10 MG tablet Take 1 tablet (10 mg total) by mouth daily. 30 tablet 11   lamoTRIgine (LAMICTAL) 100 MG tablet Take 2 tablets (200 mg  total) by mouth daily. 60 tablet 2   meloxicam (MOBIC) 7.5 MG tablet Take 1 tablet (7.5 mg total) by mouth daily. 30 tablet 3   metFORMIN (GLUCOPHAGE) 500 MG tablet Take 2 tablets (1,000 mg total) by mouth daily with breakfast. 60 tablet 3   polyethylene glycol powder (GLYCOLAX/MIRALAX) 17 GM/SCOOP powder Mix and take 17 g by mouth daily. 850 g 3   Semaglutide, 2 MG/DOSE, (OZEMPIC, 2 MG/DOSE,) 8 MG/3ML SOPN Inject 1.5 mg into [name] skin once a week. Take 25 clicks less from [name] 2 mg dose     venlafaxine XR (EFFEXOR-XR) 150 MG 24 hr capsule Take 1 capsule (150 mg total) by mouth daily. 30 capsule 3   venlafaxine XR (EFFEXOR-XR) 75 MG 24 hr capsule Take 1 capsule (75 mg total) by mouth daily. 90 capsule 0   No current facility-administered medications on file prior to visit.    Allergies  Allergen Reactions   Hydromorphone Hcl Anaphylaxis and Hives   Meperidine Hcl Anaphylaxis and Hives   Morphine Anaphylaxis and Hives    Cannot take "anything in this family"    Gramineae Pollens Other (See Comments)    Stuffy nose   Lisinopril Cough   Sumatriptan Other (See Comments)    Makes headaches worse.    Tape Rash    EKG LEADS BLISTER [name] [name]'S SKIN AND LEAVE RED, RAISED AREAS!!   Zolmitriptan Other (See Comments)    Headache     DIAGNOSTIC DATA (LABS, IMAGING, TESTING) - I reviewed [name] records, labs, notes, testing and imaging myself where available.  Lab Results  Component Value Date   WBC 6.2 12/30/2020   HGB 11.6 12/30/2020   HCT 35.0 12/30/2020   MCV 85 12/30/2020   PLT 258 12/30/2020      Component Value Date/Time   NA 142 10/16/2022 1653   K 3.9  10/16/2022 1653   CL 103 10/16/2022 1653   CO2 26 10/16/2022 1653   GLUCOSE 92 10/16/2022 1653   GLUCOSE 167 (H) 06/24/2017 0659   BUN 11 10/16/2022 1653   CREATININE 0.72 10/16/2022 1653   CREATININE 0.66 11/09/2015 1617   CALCIUM 9.8 10/16/2022 1653   PROT 6.6 12/30/2020 1438   ALBUMIN 4.1 12/30/2020 1438   AST 13  12/30/2020 1438   ALT 12 12/30/2020 1438   ALKPHOS 97 12/30/2020 1438   BILITOT <0.2 12/30/2020 1438   GFRNONAA 91 10/27/2019 2005   GFRNONAA >89 06/24/2012 0919   GFRAA 105 10/27/2019 2005   GFRAA >89 06/24/2012 0919   Lab Results  Component Value Date   CHOL 220 (H) 10/16/2022   HDL 67 10/16/2022   LDLCALC 117 (H) 10/16/2022   LDLDIRECT 130 (H) 08/30/2009   TRIG 207 (H) 10/16/2022   CHOLHDL 3.3 10/16/2022   Lab Results  Component Value Date   HGBA1C 6.2 08/28/2022   Lab Results  Component Value Date   VITAMINB12 769 08/19/2020   Lab Results  Component Value Date   TSH 2.280 12/30/2020    PHYSICAL EXAM:  Today's Vitals   12/27/22 0841  BP: 128/80  Pulse: 76  Weight: 290 lb (131.5 kg)  Height: 5\' 8"  (1.727 m)   Body mass index is 44.09 kg/m.   Wt Readings from Last 3 Encounters:  12/27/22 290 lb (131.5 kg)  12/05/22 291 lb 6.4 oz (132.2 kg)  11/14/22 290 lb 3.2 oz (131.6 kg)     Ht Readings from Last 3 Encounters:  12/27/22 5\' 8"  (1.727 m)  12/05/22 5\' 8"  (1.727 m)  10/16/22 5\' 8"  (1.727 m)      General: [name] [name] is awake, alert and appears not in acute distress. [name] [name] is well groomed. Head: Normocephalic, atraumatic. Neck is supple.  Mallampati 3,  neck circumference:17 inches . Nasal airflow barley patent.  Voice is hoarse. Dental status: biological  Cardiovascular:  Regular rate and cardiac rhythm by pulse,  without distended neck veins. Respiratory: Lungs are clear to auscultation.  Skin:  With evidence of ankle edema,no rash. Trunk: [name] [name]'s posture is erect.   NEUROLOGIC EXAM: [name] [name] is tired, fatigued  oriented to place and time.   Memory subjective described as intact.  Attention span & concentration ability appears normal.  Speech is fluent,  with dysphonia .  Mood and affect are appropriate.   Cranial nerves: no loss of smell or taste reported  Pupils are equal and briskly reactive to light. Funduscopic exam  deferred.  Extraocular movements in vertical and horizontal planes were intact and without nystagmus. No Diplopia. Visual fields by finger perimetry are intact. Hearing was intact to soft voice and finger rubbing.    Facial sensation intact to fine touch.  Facial motor strength is symmetric and tongue and uvula move midline.  Neck ROM : rotation, tilt and flexion extension were normal for age and shoulder shrug was symmetrical.    Motor exam:  Symmetric bulk, tone and ROM.   Normal tone without cog wheeling, symmetric grip strength .    Gait and station: [name] could rise unassisted from a seated position, walked without assistive device.   Deep tendon reflexes: in [name]  upper and lower extremities are symmetric and intact.   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 Large  neck and BMI loud snoring. , dry mouth sometimes morning headaches.   2) nightmare sleep disorder , stress exacerbates [name] frequency    [name] [name] is a [name] who has struggled much of her adult life with excessive daytime sleepiness and was seen here 7 years ago for a similar set of symptoms.  She at that time surprisingly tested negative for sleep apnea in an attended polysomnography study.  Since then she has not had CPAP.  Previously she carries a diagnosis of sleep apnea and was treated with positive airway pressure therapy.  As outlined above I think she has very high risk factors for still having apnea at least obesity hypoventilation and upper airway resistance syndrome.  I like for her to undergo a home sleep test to screen for apnea.  If [name] AHI is 5 or above I will treat this apnea until we can see if it reduces her Epworth score or not.  Should her hypersomnia persist, I would be willing to invite her for a PSG and MSLT to follow.  [name] [name] is to  factor not a shift worker and currently she does not carries a diagnosis of obstructive sleep apnea so I am restricted in giving her stimulant medications that could help her not to fall asleep and especially make her commute safer.  Modafinil is usually used for patients but they need an underlying diagnosis. At this time I could only order generic Ritalin or generic Adderall to allow her less fatigue and less sleepiness in daytime. She stated she has failed these medications.      I plan to follow up either personally or through our NP within 3-4 months.   I would like to thank Alicia Amel, MD and Latrelle Dodrill, Md [address] [place],  [place] [address] for allowing me to meet with and to take care of this pleasant [name].   CC: I will share my notes with PCP.  After spending a total time of  45  minutes face to face and additional time for physical and neurologic examination, review of laboratory studies,  personal review of imaging studies, reports and results of other testing and review of referral information / records as far as provided in visit,   Electronically signed by: Melvyn Novas, MD 12/27/2022 9:10 AM  [facility] Board certified by [name] [employer] of Sleep Medicine and Diplomate of [name] of [employer] Medicine. Board certified In Neurology through [name] ABPN, Fellow of [name] [employer].

## 2023-01-03 ENCOUNTER — Other Ambulatory Visit (HOSPITAL_COMMUNITY): Payer: Self-pay

## 2023-01-04 ENCOUNTER — Telehealth: Payer: Self-pay

## 2023-01-04 NOTE — Telephone Encounter (Signed)
Pt would like a MyChart msg from the nurse to discuss costs related to CPAP machine and DME company - pt is paying out of pocket and is scheduled for a sleep study next week and would just like to be aware of potential upcoming costs. Phone is not currently working so instead of calling she is requesting a MyChart message to discuss these costs. Questions about which DME company is used and also if payment assistance of any kind is available.

## 2023-01-05 ENCOUNTER — Ambulatory Visit (INDEPENDENT_AMBULATORY_CARE_PROVIDER_SITE_OTHER): Payer: Self-pay | Admitting: Student

## 2023-01-05 VITALS — BP 116/78 | HR 81 | Ht 68.0 in | Wt 290.8 lb

## 2023-01-05 DIAGNOSIS — E11628 Type 2 diabetes mellitus with other skin complications: Secondary | ICD-10-CM

## 2023-01-05 DIAGNOSIS — Z794 Long term (current) use of insulin: Secondary | ICD-10-CM

## 2023-01-05 LAB — POCT GLYCOSYLATED HEMOGLOBIN (HGB A1C): HbA1c, POC (controlled diabetic range): 6.3 % (ref 0.0–7.0)

## 2023-01-05 MED ORDER — OZEMPIC (2 MG/DOSE) 8 MG/3ML ~~LOC~~ SOPN
1.3800 mg | PEN_INJECTOR | SUBCUTANEOUS | Status: DC
Start: 2023-01-05 — End: 2023-02-22

## 2023-01-05 NOTE — Assessment & Plan Note (Addendum)
A1c is quite well controlled today at 6.3% on 36 clicks of the 2mg  pen. However, given that we are hopeful for improved weight loss, we will uptitrate to 50 clicks weekly. She will monitor for symptoms. She tells me she is on her last pen, so I will discuss her care with our pharmacy team to determine if we need to get more Ozempic pens for her vs. Apply for [facility] patient assistance.  - Increase Ozempic to 50 clicks of the 2mg  pen weekly - Will discuss Mounjaro transition with our pharmacy team  - She again declines a statin today  - Long discussion about increased protein and decreased carbohydrate intake to address both her DM and her weight' - Discussed adding resistance training to her health behaviors

## 2023-01-05 NOTE — Progress Notes (Signed)
    SUBJECTIVE:   CHIEF COMPLAINT / HPI:   Diabetes  Weight Loss Efforts Here today to "see what my weight and A1c is doing."  At our last visit we increased her Ozempic dosing to 36 clicks from a 2mg  pen (1mg /week). Since then she has been feeling generally well, has not had any of the symptomatic lows that she was having on the higher doses. A1c remains well-controlled at 6.3% today.  She is discouraged however at her stalled weight loss efforts.  Weight is stable at 290 pounds today but she was really hoping to see more weight loss.  Long discussion today about her diet, she is a vegetarian which makes increasing her protein intake quite difficult.  She did Engineer, maintenance app but found searching for the foods that she was needing to be cumbersome so she has just been keeping notes of what she has been eating.  On review, she is only eating about 2-3 meals a day with some snacks interspersed in between.  Her diet is very carb heavy.  Given her vegetarian diet, she is certainly getting high fiber, but protein is a challenge.  She does not eat eggs and "has not had beans in a minute."  She would be interested in transitioning from Ozempic to [place].  Though medication affordability is a major concern here given her uninsured status. She receives her Ozempic through [employer] patient assistance program.  She continues to decline a statin.  Health Maintenance Has not yet called the cancer center's community outreach program to get set up for FIT testing and mammography.    OBJECTIVE:   BP 116/78   Pulse 81   Ht 5\' 8"  (1.727 m)   Wt 290 lb 12.8 oz (131.9 kg)   SpO2 99%   BMI 44.22 kg/m   General: alert & oriented, no apparent distress, well groomed HEENT: normocephalic, atraumatic, EOM grossly intact, oral mucosa moist, neck supple Respiratory: normal respiratory effort GI: non-distended Skin: no rashes, no jaundice Psych: appropriate mood and affect, though a bit  discouraged when discussing weight loss   ASSESSMENT/PLAN:   Diabetes mellitus (HCC) A1c is quite well controlled today at 6.3% on 36 clicks of the 2mg  pen. However, given that we are hopeful for improved weight loss, we will uptitrate to 50 clicks weekly. She will monitor for symptoms. She tells me she is on her last pen, so I will discuss her care with our pharmacy team to determine if we need to get more Ozempic pens for her vs. Apply for [facility] patient assistance.  - Increase Ozempic to 50 clicks of the 2mg  pen weekly - Will discuss Mounjaro transition with our pharmacy team  - She again declines a statin today  - Long discussion about increased protein and decreased carbohydrate intake to address both her DM and her weight' - Discussed adding resistance training to her health behaviors    Health Maintenance Again provided the number to the Cancer [employer] program for FIT testing and mammography scholarships   J Dorothyann Gibbs, MD [facility]

## 2023-01-05 NOTE — Patient Instructions (Addendum)
Congrats on the EXCELLENT A1c control.   Let's go to 50 clicks daily. AND I will talk to [facility] about Mounjaro. If this is an option, I will call you and talk through dosing.   I think we're going to have to reallllllly lean in on diet. You may have a bit of a harder time given that you are vegetarian. But we can work around this.   I think that we need to really focus on protein in your diet. Veggie burgers and veggie "chicken" is a fine option, especially the ones that have ~35-40grams. Beans are a better option because they're a whole food and not processed. Cheese is a fine options. Nuts are *okay* but have a fair amount of fat per gram of protein.   Keep thinking about cholesterol medicine. Mammography   [facility] Scholarship Fund [email or web address]  Colon Cancer Screening  Call [phone] for FIT testing   J Dorothyann Gibbs, MD

## 2023-01-08 ENCOUNTER — Ambulatory Visit: Payer: Self-pay | Admitting: Neurology

## 2023-01-08 ENCOUNTER — Other Ambulatory Visit (HOSPITAL_COMMUNITY): Payer: Self-pay

## 2023-01-08 DIAGNOSIS — G47419 Narcolepsy without cataplexy: Secondary | ICD-10-CM

## 2023-01-08 DIAGNOSIS — R0683 Snoring: Secondary | ICD-10-CM

## 2023-01-08 DIAGNOSIS — G4733 Obstructive sleep apnea (adult) (pediatric): Secondary | ICD-10-CM

## 2023-01-08 DIAGNOSIS — F515 Nightmare disorder: Secondary | ICD-10-CM

## 2023-01-08 DIAGNOSIS — G4719 Other hypersomnia: Secondary | ICD-10-CM

## 2023-01-08 DIAGNOSIS — E662 Morbid (severe) obesity with alveolar hypoventilation: Secondary | ICD-10-CM

## 2023-01-11 ENCOUNTER — Ambulatory Visit: Payer: No Typology Code available for payment source | Admitting: Student

## 2023-01-11 NOTE — Progress Notes (Deleted)
    SUBJECTIVE:   CHIEF COMPLAINT / HPI:   Right Shoulder Pain Has been an issue since at least August/September.  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  On my assessment in September, I felt this was likely an impingement syndrome and offered steroid injection at the time, she deferred because she did not want to have "a false sense of security" and ended up causing a worse injury.  PERTINENT  PMH / PSH: ***  OBJECTIVE:   There were no vitals taken for this visit.  ***  ASSESSMENT/PLAN:   No problem-specific Assessment & Plan notes found for this encounter.     Eliezer Mccoy, MD [facility]

## 2023-01-15 ENCOUNTER — Other Ambulatory Visit (HOSPITAL_COMMUNITY): Payer: Self-pay

## 2023-01-15 ENCOUNTER — Encounter: Payer: Self-pay | Admitting: Student

## 2023-01-15 ENCOUNTER — Encounter: Payer: Self-pay | Admitting: Neurology

## 2023-01-17 ENCOUNTER — Other Ambulatory Visit: Payer: Self-pay

## 2023-01-17 ENCOUNTER — Other Ambulatory Visit (HOSPITAL_COMMUNITY): Payer: Self-pay

## 2023-01-18 ENCOUNTER — Other Ambulatory Visit (HOSPITAL_COMMUNITY): Payer: Self-pay

## 2023-01-18 NOTE — Progress Notes (Signed)
[facility]  [name] [name] 47 year old female 12-Aug-1975   HOME SLEEP TEST REPORT ( by Watch PAT)   STUDY DATE:  01-19-2023    ORDERING CLINICIAN: Melvyn Novas, MD  REFERRING CLINICIAN: Dr Marisue Humble, MD & Pollie Meyer Estevan Ryder, MD    CLINICAL INFORMATION/HISTORY: [name] [name] is a 47 y.o. female patient who is seen upon referral on 12/27/2022 from Dr Marisue Humble  for a new evaluation of sleepiness, after a nearly 7 year hiatus.  She has undergone CPAP titrations, MSLT and a split night had been ordered in 02-2016 at [facility] - she did not qualify for a new CPAP as her AHI was 0. She continued to use a machine however- The patient is using a Estate agent. Her 30 day compliance shows 70% compliance but only 57% with over 4 hours of nightly use. Average usage for days on CPAP of 7 hours and 17 minutes but there were 9 days without using CPAP. This month was unusual because she had struggled with a sinus infection. The mean pressure on her CPAP machine is 13.8 cm water, average pressure for 90% of user time is 15.8 cm water and the residual average AHI is 3.3 per hour.      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 Excessive daytime sleepiness, HTN , nasal congestion and obesity BMI 40..  As outlined above I think she has very high risk factors for still having apnea at least obesity hypoventilation and upper airway resistance syndrome.  I like for her to undergo a home sleep test to screen for apnea.   If the AHI is 5 or above I will treat this apnea until we can see if it reduces her Epworth score or not.  Should her hypersomnia persist, I would be willing to invite her for a PSG and MSLT to follow. 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 Modafinil is usually used for patients but they need an underlying diagnosis.      Epworth sleepiness score:  21/ 24 points  FSS endorsed at 54/ 63 points.  In 2017 , her ESS was 17/24 points  ,  Fatigue severity score 46/63   , depression score 3/15   BMI:  44.09 kg/m   Neck Circumference: 17"   FINDINGS:   Sleep Summary:   Total Recording Time (hours, min):   8 hours 49 minutes  Total Sleep Time (hours, min):    6 hours 58 minutes             Percent REM (%):    21.4%                                    Respiratory Indices by AASM criteria:   Calculated pAHI (per hour): 16.1/h   without central apneas being recognized.                        REM pAHI:  14.4/h                                              NREM pAHI: 16.6/h                            Supine AHI: The patient slept almost exclusively supine with an AHI of 16.2/h  ,  AHI on her on her left side was 14.3/h , only present for 22 minutes.  Snoring was present for less than 20% of the recorded sleep time and reached a mean volume of 41 dB.                                              Oxygen  saturation Statistics:    O2 Saturation Range (%): Between the nadir at 87% with a maximum saturation of 99% with a mean saturation of 93%                                      O2 Saturation (minutes) <89%: 0 minutes         Pulse Rate Statistics:   Pulse Mean (bpm):   79 bpm              Pulse Range:      Between 65 and 115 bpm           IMPRESSION:  This HST confirms the presence of mild to moderate obstructive sleep apnea which was not REM sleep dependent. There was no associated hypoxia and no bradycardia.   RECOMMENDATION: I would recommend to use CPAP therapy even for this moderate degree of sleep apnea to improve daytime sleepiness.  An auto titration CPAP  device between 5 and 15 cm water pressure should be used with 2 cm expiratory pressure relief, heated humidification and a mask to be fitted.  I will also add the generic form of Provigil to her medication regimen at 200 mg a day.  These are tablets that the patient can break in half if she needs a lower dose.  The diagnosis of obstructive sleep apnea allows me to prescribe this medication for patients with residual hypersomnia.    INTERPRETING PHYSICIAN:   Melvyn Novas, MD  [facility] Sleep Board certified by The [employer] of Sleep Medicine and Diplomate of the [employer] of Sleep Medicine. Board certified In Neurology through the ABPN, Fellow of the [employer] of Neurology.

## 2023-01-19 ENCOUNTER — Other Ambulatory Visit: Payer: Self-pay

## 2023-01-19 ENCOUNTER — Encounter (HOSPITAL_COMMUNITY): Payer: Self-pay

## 2023-01-19 ENCOUNTER — Other Ambulatory Visit (HOSPITAL_COMMUNITY): Payer: Self-pay

## 2023-01-21 MED ORDER — MODAFINIL 200 MG PO TABS
100.0000 mg | ORAL_TABLET | Freq: Every day | ORAL | 2 refills | Status: DC
Start: 2023-01-21 — End: 2023-07-24
  Filled 2023-01-21: qty 45, 90d supply, fill #0
  Filled 2023-05-24 – 2023-05-25 (×2): qty 15, 30d supply, fill #1
  Filled 2023-05-31 – 2023-06-04 (×2): qty 45, 90d supply, fill #1
  Filled 2023-06-15 – 2023-06-20 (×2): qty 15, 30d supply, fill #1

## 2023-01-21 NOTE — Procedures (Signed)
[facility]  [name] [name] 47 year old female 12-Aug-1975   HOME SLEEP TEST REPORT ( by Watch PAT)   STUDY DATE:  01-19-2023    ORDERING CLINICIAN: Melvyn Novas, MD  REFERRING CLINICIAN: Dr Marisue Humble, MD & Pollie Meyer Estevan Ryder, MD    CLINICAL INFORMATION/HISTORY: [name] [name] is a 47 y.o. female patient who is seen upon referral on 12/27/2022 from Dr Marisue Humble  for a new evaluation of sleepiness, after a nearly 7 year hiatus.  She has undergone CPAP titrations, MSLT and a split night had been ordered in 02-2016 at [facility] - she did not qualify for a new CPAP as her AHI was 0. She continued to use a machine however- The patient is using a Estate agent. Her 30 day compliance shows 70% compliance but only 57% with over 4 hours of nightly use. Average usage for days on CPAP of 7 hours and 17 minutes but there were 9 days without using CPAP. This month was unusual because she had struggled with a sinus infection. The mean pressure on her CPAP machine is 13.8 cm water, average pressure for 90% of user time is 15.8 cm water and the residual average AHI is 3.3 per hour.      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 Excessive daytime sleepiness, HTN , nasal congestion and obesity BMI 40..  As outlined above I think she has very high risk factors for still having apnea at least obesity hypoventilation and upper airway resistance syndrome.  I like for her to undergo a home sleep test to screen for apnea.   If the AHI is 5 or above I will treat this apnea until we can see if it reduces her Epworth score or not.  Should her hypersomnia persist, I would be willing to invite her for a PSG and MSLT to follow. 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 Modafinil is usually used for patients but they need an underlying diagnosis.      Epworth sleepiness score:  21/ 24 points  FSS endorsed at 54/ 63 points.  In 2017 , her ESS was 17/24 points  ,  Fatigue severity score 46/63   , depression score 3/15   BMI:  44.09 kg/m   Neck Circumference: 17"   FINDINGS:   Sleep Summary:   Total Recording Time (hours, min):   8 hours 49 minutes  Total Sleep Time (hours, min):    6 hours 58 minutes             Percent REM (%):    21.4%                                    Respiratory Indices by AASM criteria:   Calculated pAHI (per hour): 16.1/h   without central apneas being recognized.                        REM pAHI:  14.4/h                                              NREM pAHI: 16.6/h                            Supine AHI: The patient slept almost exclusively supine with an AHI of 16.2/h  ,  AHI on her on her left side was 14.3/h , only present for 22 minutes.  Snoring was present for less than 20% of the recorded sleep time and reached a mean volume of 41 dB.                                              Oxygen  saturation Statistics:    O2 Saturation Range (%): Between the nadir at 87% with a maximum saturation of 99% with a mean saturation of 93%                                      O2 Saturation (minutes) <89%: 0 minutes         Pulse Rate Statistics:   Pulse Mean (bpm):   79 bpm              Pulse Range:      Between 65 and 115 bpm           IMPRESSION:  This HST confirms the presence of mild to moderate obstructive sleep apnea which was not REM sleep dependent. There was no associated hypoxia and no bradycardia.   RECOMMENDATION: I would recommend to use CPAP therapy even for this moderate degree of sleep apnea to improve daytime sleepiness.  An auto titration CPAP  device between 5 and 15 cm water pressure should be used with 2 cm expiratory pressure relief, heated humidification and a mask to be fitted.  I will also add the generic form of Provigil to her medication regimen at 200 mg a day.  These are tablets that the patient can break in half if she needs a lower dose.  The diagnosis of obstructive sleep apnea allows me to prescribe this medication for patients with residual hypersomnia.    INTERPRETING PHYSICIAN:   Melvyn Novas, MD  [facility] Sleep Board certified by The [employer] of Sleep Medicine and Diplomate of the [employer] of Sleep Medicine. Board certified In Neurology through the ABPN, Fellow of the [employer] Neu[employer].

## 2023-01-21 NOTE — Addendum Note (Signed)
Addended by: Melvyn Novas on: 01/21/2023 07:05 PM   Modules accepted: Orders

## 2023-01-22 ENCOUNTER — Other Ambulatory Visit (HOSPITAL_COMMUNITY): Payer: Self-pay

## 2023-01-22 ENCOUNTER — Telehealth: Payer: Self-pay | Admitting: Neurology

## 2023-01-22 MED ORDER — VENLAFAXINE HCL ER 75 MG PO CP24
75.0000 mg | ORAL_CAPSULE | Freq: Every day | ORAL | 0 refills | Status: DC
  Filled 2023-01-22: qty 90, 90d supply, fill #0

## 2023-01-22 NOTE — Telephone Encounter (Signed)
-----   Message from Irmo Dohmeier sent at 01/21/2023  7:05 PM EST ----- Mild- moderate degree of OSA confirmed, patient will need to start CPAP therapy and I will add Modafinil for her EDS- to safely drive to work and back. 200 mg tab can be split in half for daily use.  (21/ 24 points  FSS endorsed at 54/ 63 points.  recommend to use CPAP therapy even for this moderate degree of sleep apnea to improve daytime sleepiness.  An auto titration CPAP device between 5 and 15 cm water pressure should be used with 2 cm expiratory pressure relief, heated humidification and a mask to be fitted. Please consider that pt is uninsured.

## 2023-01-22 NOTE — Telephone Encounter (Signed)
I called pt. I advised pt that Dr. Vickey Huger reviewed their sleep study results and found that pt has mild to moderate sleep apnea. Dr. Vickey Huger recommends that pt starts auto CPAP. I reviewed PAP compliance expectations with the pt. Pt is agreeable to starting a CPAP. I advised pt that an order will be sent to a DME, [facility], and [facility] will call the pt within about one week after they file with the pt's insurance. [facility] will show the pt how to use the machine, fit for masks, and troubleshoot the CPAP if needed. A follow up appt will need to be made with NP or Dr. Vickey Huger within 31-90 days from the date you are set up. Pt verbalized understanding to arrive 15 minutes early and bring their CPAP. Pt verbalized understanding of results. Pt had no questions at this time but was encouraged to call back if questions arise. I have sent the order to [employer] [facility] and have received confirmation that they have received the order.

## 2023-01-24 ENCOUNTER — Other Ambulatory Visit (HOSPITAL_COMMUNITY): Payer: Self-pay

## 2023-02-05 ENCOUNTER — Other Ambulatory Visit (HOSPITAL_COMMUNITY): Payer: Self-pay

## 2023-02-06 ENCOUNTER — Other Ambulatory Visit: Payer: Self-pay | Admitting: Student

## 2023-02-06 DIAGNOSIS — G4733 Obstructive sleep apnea (adult) (pediatric): Secondary | ICD-10-CM

## 2023-02-09 ENCOUNTER — Other Ambulatory Visit (HOSPITAL_COMMUNITY): Payer: Self-pay

## 2023-02-10 ENCOUNTER — Other Ambulatory Visit (HOSPITAL_COMMUNITY): Payer: Self-pay

## 2023-02-19 ENCOUNTER — Telehealth: Payer: Self-pay

## 2023-02-19 NOTE — Telephone Encounter (Signed)
Community message sent to [employer] for CPAP machine.   Will await response.   Talbot Grumbling, RN

## 2023-02-20 NOTE — Telephone Encounter (Signed)
Receipt confirmed by [employer].   Beyounce Dickens C Christopherjame Carnell, RN  

## 2023-02-22 ENCOUNTER — Ambulatory Visit (INDEPENDENT_AMBULATORY_CARE_PROVIDER_SITE_OTHER): Payer: Self-pay | Admitting: Pharmacist

## 2023-02-22 ENCOUNTER — Encounter: Payer: Self-pay | Admitting: Pharmacist

## 2023-02-22 VITALS — BP 141/106 | HR 88 | Wt 294.0 lb

## 2023-02-22 DIAGNOSIS — Z794 Long term (current) use of insulin: Secondary | ICD-10-CM

## 2023-02-22 DIAGNOSIS — E11628 Type 2 diabetes mellitus with other skin complications: Secondary | ICD-10-CM

## 2023-02-22 NOTE — Assessment & Plan Note (Signed)
Diabetes longstanding currently doing well while taking no medications.  We reviewed weight loss history and how this improves insulin sensitivity and glucose control. Celebrated level of glucose control without any medications and weight loss over the past 5-10 years. -Continue diet, activity and stress reduction strategies to maintain diabetes control.  -Extensively discussed pathophysiology of diabetes, recommended lifestyle interventions, dietary effects on blood sugar control.

## 2023-02-22 NOTE — Patient Instructions (Signed)
It was nice to see you today!  Your [name] [name] 3 report looks GREAT!   Your weight loss  Your goal blood sugar is 80-130 before eating and less than 180 after eating.  Medication Changes: Continue all other medication the same.   Monitor blood sugars at home and keep a log (glucometer or piece of paper) to bring with you to your next visit.  Keep up the good work with diet and exercise. Aim for a diet full of vegetables, fruit and lean meats (chicken, [place], fish). Try to limit salt intake by eating fresh or frozen vegetables (instead of canned), rinse canned vegetables prior to cooking and do not add any additional salt to meals.

## 2023-02-22 NOTE — Progress Notes (Signed)
    S:     Chief Complaint  Patient presents with   Medication Management    Diabetes   47 y.o. female who presents for diabetes evaluation, education, and management. Patient arrives in  good spirits and presents without  any assistance.   Patient was referred and last seen by Primary Care Provider, Dr. Marisue Humble, on 01/05/2023.   PMH is significant for Diabetes, Hypertension, Hyperlipidemia, Anxiety, ADHD  At visit with daughter last week I provided this patient with a LIbre 3 sensor to wear for the week prior to our visit.  She was concerned on arrival that the report would be "bad"/   However, patient noticed several desserts like pumpkin pie did not make her sugar spike.   Current diabetes medications include: None Current hypertension medications include: None Current hyperlipidemia medications include: None  Patient-reported exercise habits: Outside everyday   O:   Review of Systems  Cardiovascular:  Positive for leg swelling (intermittently).    Physical Exam Pulmonary:     Effort: Pulmonary effort is normal.  Neurological:     Mental Status: She is alert. Mental status is at baseline.  Psychiatric:        Mood and Affect: Mood normal.        Thought Content: Thought content normal.        Judgment: Judgment normal.    Libre3  CGM Download today 02/22/2023  % Time CGM is active: 100% for 1 week Average Glucose: 148 mg/dL Glucose Management Indicator: 6.9  Glucose Variability: 20% (goal <36%) Time in Goal:  - Time in range 70-180: 85% - Time above range: 15% (all 15% were 180-250) - Time below range: 0% Observed patterns:  Minimal variability   Lab Results  Component Value Date   HGBA1C 6.3 01/05/2023   Vitals:   02/22/23 1625  BP: (!) 141/106  Pulse: 88  SpO2: 100%    Lipid Panel     Component Value Date/Time   CHOL 220 (H) 10/16/2022 1653   TRIG 207 (H) 10/16/2022 1653   HDL 67 10/16/2022 1653   CHOLHDL 3.3 10/16/2022 1653   CHOLHDL 3.9  11/09/2015 1617   VLDL 62 (H) 11/09/2015 1617   LDLCALC 117 (H) 10/16/2022 1653   LDLDIRECT 130 (H) 08/30/2009 2217    A/P: Diabetes longstanding currently doing well while taking no medications.  We reviewed weight loss history and how this improves insulin sensitivity and glucose control. Celebrated level of glucose control without any medications and weight loss over the past 5-10 years. -Continue diet, activity and stress reduction strategies to maintain diabetes control.  -Extensively discussed pathophysiology of diabetes, recommended lifestyle interventions, dietary effects on blood sugar control.   Hypertension history, currently not taking any medication.  Patient believes elevated readings were due to an energy drink prior to her visit today.   - Continue to follow / re-assess blood pressure at next visit - Encouraged minimization of energy drinks.   Written patient instructions provided. Patient verbalized understanding of treatment plan.  Total time in face to face counseling 29 minutes.    Follow-up:  Pharmacist PRN PCP clinic visit in 04/2023

## 2023-02-23 ENCOUNTER — Other Ambulatory Visit: Payer: Self-pay | Admitting: Student

## 2023-02-23 DIAGNOSIS — G4733 Obstructive sleep apnea (adult) (pediatric): Secondary | ICD-10-CM

## 2023-02-23 NOTE — Progress Notes (Signed)
Reviewed and agree with Dr Koval's plan.   

## 2023-03-02 ENCOUNTER — Ambulatory Visit (INDEPENDENT_AMBULATORY_CARE_PROVIDER_SITE_OTHER): Payer: Self-pay | Admitting: Family Medicine

## 2023-03-02 ENCOUNTER — Telehealth: Payer: Self-pay

## 2023-03-02 ENCOUNTER — Encounter (HOSPITAL_COMMUNITY): Payer: Self-pay

## 2023-03-02 ENCOUNTER — Other Ambulatory Visit (HOSPITAL_COMMUNITY): Payer: Self-pay

## 2023-03-02 VITALS — BP 142/97 | HR 89 | Temp 98.6°F | Wt 291.0 lb

## 2023-03-02 DIAGNOSIS — E11628 Type 2 diabetes mellitus with other skin complications: Secondary | ICD-10-CM

## 2023-03-02 DIAGNOSIS — Z794 Long term (current) use of insulin: Secondary | ICD-10-CM

## 2023-03-02 LAB — POCT GLUCOSE FINGERSTICK: Glucose: 165 — AB (ref 70–99)

## 2023-03-02 MED ORDER — ONETOUCH VERIO W/DEVICE KIT
PACK | 0 refills | Status: DC
  Filled 2023-03-02: qty 1, fill #0

## 2023-03-02 MED ORDER — ONETOUCH VERIO VI STRP
ORAL_STRIP | Freq: Three times a day (TID) | 3 refills | Status: DC
  Filled 2023-03-02: qty 100, fill #0

## 2023-03-02 MED ORDER — ONETOUCH DELICA LANCING DEV MISC
0 refills | Status: DC
  Filled 2023-03-02: qty 1, fill #0

## 2023-03-02 MED ORDER — ONETOUCH DELICA LANCETS 33G MISC
Freq: Three times a day (TID) | 3 refills | Status: DC
  Filled 2023-03-02: qty 100, fill #0

## 2023-03-02 NOTE — Progress Notes (Cosign Needed)
    SUBJECTIVE:   CHIEF COMPLAINT / HPI: Sugars are high  Per Dr. Macky Lower last note 1 week ago, longstanding controlled diabetes on no medications. Recently had to stop Ozempic as free program ended. Since then says sugars have been higher. Has been on [place]. Sugars are higher since earlier this week. States diet has not been different despite the holidays.   Took daughter's diabetic medicine Novolog and a long acting.  Endorses no vomiting does have some mild nausea.  No increased urinary frequency or thirst.  No abdominal pain.  Was unable to get refill of Lamictal since before holidays. Has been out for around 3-4 days.    PERTINENT  PMH / PSH: Type 2 diabetes  OBJECTIVE:   BP (!) 142/97   Pulse 89   Temp 98.6 F (37 C) (Oral)   Wt 291 lb (132 kg)   SpO2 100%   BMI 44.25 kg/m   General: NAD  Neuro: A&O Cardiovascular: RRR, no murmurs, no peripheral edema Respiratory: normal WOB on RA, CTAB, no wheezes, ronchi or rales Abdomen: soft, NTTP, no rebound or guarding Extremities: Moving all 4 extremities equally   ASSESSMENT/PLAN:   Assessment & Plan Type 2 diabetes mellitus with other skin complication, with long-term current use of insulin (HCC) Last A1c 6.3 in early November.  Repeat in early February.  Vital signs stable, low suspicion for recurrent DKA.  Counseled on not taking daughter's insulin.  Will follow-up in 1 week sent sugar checking supplies to patient's pharmacy.  Also instructed that this is too expensive to get over-the-counter supplies from [place] of the ReliOn brand.  Discussed recording fasting blood sugars in the morning and bring into follow-up.  Potentially add metformin versus Jardiance at that visit given loss of Ozempic.  Return in about 1 week (around 03/09/2023).  Celine Mans, MD [facility]

## 2023-03-02 NOTE — Patient Instructions (Signed)
It was great to see you! Thank you for allowing me to participate in your care!  Our plans for today:  -Please measure your sugar daily in the morning when you get up write this down and bring into your appointment next week. -Please schedule an appointment to follow-up in 1 week.   Please arrive 15 minutes PRIOR to your next scheduled appointment time! If you do not, this affects OTHER patients' care.  Take care and seek immediate care sooner if you develop any concerns.   Celine Mans, MD, PGY-2 [facility] 2:48 PM 03/02/2023  [facility]

## 2023-03-02 NOTE — Assessment & Plan Note (Addendum)
Last A1c 6.3 in early November.  Repeat in early February.  Vital signs stable, low suspicion for recurrent DKA.  Counseled on not taking daughter's insulin.  Will follow-up in 1 week sent sugar checking supplies to patient's pharmacy.  Also instructed that this is too expensive to get over-the-counter supplies from [place] of the ReliOn brand.  Discussed recording fasting blood sugars in the morning and bring into follow-up.  Potentially add metformin versus Jardiance at that visit given loss of Ozempic.

## 2023-03-02 NOTE — Telephone Encounter (Signed)
Patient calls nurse line regarding concerns with increased blood sugar levels.   She reports that she stopped taking Ozempic about two weeks ago. For the last 4 days she has been experiencing higher levels. She reports that these have been ranging mostly in the 200's, however, highest was 355.  She does report taking her daughter's novolog when levels have been elevated.   She also reports that she feels that vision is "not normal" when her levels have been elevated.   She asked that I send message to Dr. Raymondo Band regarding this concern. Advised patient that I would recommend being seen today for further evaluation of elevated blood sugar levels.   Scheduled patient this afternoon in [facility] clinic.   Veronda Prude, RN

## 2023-03-05 ENCOUNTER — Telehealth: Payer: Self-pay | Admitting: Pharmacist

## 2023-03-05 ENCOUNTER — Other Ambulatory Visit (HOSPITAL_COMMUNITY): Payer: Self-pay

## 2023-03-05 MED ORDER — FREESTYLE LIBRE 3 PLUS SENSOR MISC
11 refills | Status: DC
  Filled 2023-03-05 – 2023-03-09 (×2): qty 2, 30d supply, fill #0
  Filled 2023-04-04: qty 2, 30d supply, fill #1
  Filled 2023-05-05 – 2023-05-07 (×2): qty 2, 30d supply, fill #2
  Filled 2023-06-20: qty 2, 30d supply, fill #3
  Filled 2023-07-22 – 2023-08-07 (×4): qty 2, 30d supply, fill #4
  Filled 2023-09-09: qty 2, 30d supply, fill #5
  Filled 2023-10-23: qty 2, 30d supply, fill #6
  Filled 2023-12-13: qty 2, 30d supply, fill #7
  Filled 2024-01-11: qty 2, 30d supply, fill #8
  Filled 2024-02-11: qty 2, 30d supply, fill #9

## 2023-03-05 NOTE — Telephone Encounter (Signed)
Patient called and left message that her glucose readings were spiking after "every time she eats".   She reports that she has noticed a significant increase in her glucose readings following the end of her Ozempic (semaglutide) supply.   She is currently between insurances.  She does not qualify for Medicaid.   She is working through options with the insurance exchange.  She does not know if she will have any new insurance prior to March.    I shared that CGM supplies are ~ $75 per month with Libre sensors (without insurance) through the company.    She believed that would be acceptable and asked for sensors to be sent to her pharmacy.  New prescription for CGM sensors provided.   No medication change suggested as her most recent CGM report shows  70% TIR 70-180 and  24% 180-250  She has an appt with PCP 03/13/2023 - Dr. Marisue Humble.

## 2023-03-06 ENCOUNTER — Other Ambulatory Visit: Payer: Self-pay

## 2023-03-06 ENCOUNTER — Other Ambulatory Visit (HOSPITAL_COMMUNITY): Payer: Self-pay

## 2023-03-06 ENCOUNTER — Other Ambulatory Visit: Payer: Self-pay | Admitting: Student

## 2023-03-08 ENCOUNTER — Other Ambulatory Visit (HOSPITAL_COMMUNITY): Payer: Self-pay

## 2023-03-08 MED ORDER — VENLAFAXINE HCL ER 75 MG PO CP24
75.0000 mg | ORAL_CAPSULE | Freq: Every day | ORAL | 0 refills | Status: DC
  Filled 2023-03-08 – 2023-03-26 (×2): qty 90, 90d supply, fill #0

## 2023-03-09 ENCOUNTER — Telehealth: Payer: Self-pay | Admitting: Pharmacist

## 2023-03-09 ENCOUNTER — Other Ambulatory Visit (HOSPITAL_COMMUNITY): Payer: Self-pay

## 2023-03-09 NOTE — Telephone Encounter (Signed)
 Patient contacts office for follow-up of CGM cost  Patient reports her Libre 3 CGM sensors were quoted as $160  We discussed contacting customer service with [employer] to inquire about how to lower cost to something ~ $75 per month or less.   She is not currently taking ANY diabetes medication but would like to return to taking Ozempic  (semaglutide ) when her new insurance starts next month.   She plans to keep February appointment with me and return when she has insurance, next month.   Recent A1c 6.3 on 01/05/2023   Total time with patient call and documentation of interaction: 8 minutes.

## 2023-03-12 NOTE — Telephone Encounter (Signed)
 Reviewed and agree with Dr Macky Lower plan.

## 2023-03-13 ENCOUNTER — Encounter: Payer: Self-pay | Admitting: Student

## 2023-03-13 ENCOUNTER — Ambulatory Visit (INDEPENDENT_AMBULATORY_CARE_PROVIDER_SITE_OTHER): Payer: Self-pay | Admitting: Student

## 2023-03-13 VITALS — BP 120/72 | HR 97 | Ht 69.0 in | Wt 296.8 lb

## 2023-03-13 DIAGNOSIS — Z794 Long term (current) use of insulin: Secondary | ICD-10-CM

## 2023-03-13 DIAGNOSIS — G8929 Other chronic pain: Secondary | ICD-10-CM

## 2023-03-13 DIAGNOSIS — Z1231 Encounter for screening mammogram for malignant neoplasm of breast: Secondary | ICD-10-CM

## 2023-03-13 DIAGNOSIS — M25511 Pain in right shoulder: Secondary | ICD-10-CM

## 2023-03-13 DIAGNOSIS — E11628 Type 2 diabetes mellitus with other skin complications: Secondary | ICD-10-CM

## 2023-03-13 DIAGNOSIS — Z1211 Encounter for screening for malignant neoplasm of colon: Secondary | ICD-10-CM

## 2023-03-13 MED ORDER — METHYLPREDNISOLONE ACETATE 40 MG/ML IJ SUSP
40.0000 mg | Freq: Once | INTRAMUSCULAR | Status: AC
  Administered 2023-03-13: 40 mg via INTRAMUSCULAR

## 2023-03-13 MED ORDER — METFORMIN HCL ER 500 MG PO TB24: 500.0000 mg | ORAL_TABLET | Freq: Every day | ORAL | Status: DC

## 2023-03-13 NOTE — Patient Instructions (Addendum)
 [name], [name] [name] to see you today!  We injected some steroid in your shoulder. Let's see if this helps. This should also buy you some time with less pain for us  to get you plugged in with orthopedics once your insurance kicks in.  The steroid can increase your blood sugars for a little while.  I think until we can get you back on Mounjaro  in February, it make sense for you to restart taking the metformin .  Start taking just 500 mg a day and then you can slowly increase from there to a max of 2000 mg daily.  Send a MyChart message or call me around February 1st to remind me to send in your Mounjaro !   I have ordered your mammogram. This will be done at the [facility], right across the street from our clinic. You will need to call to make this appointment. Their number is [phone].  The address is [address], [place], [place] [address]   [name] Con Gasman, MD

## 2023-03-14 NOTE — Assessment & Plan Note (Signed)
 Continue to believe that she likely has an impingement syndrome versus rotator cuff arthropathy.  She is now amenable to a steroid injection therefore after informed consent was obtained, a steroid injection was performed using a posterior approach using 4mL 1% plain Lidocaine  and 40 mg of depomedrol. This was well tolerated.

## 2023-03-14 NOTE — Progress Notes (Signed)
 SUBJECTIVE:   CHIEF COMPLAINT / HPI:   Right Shoulder Pain Ongoing incisions at least August.  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  She has no history of injury to the joint.  She did get some x-rays with her chiropractor and brought me a disc, unfortunately I have not been able to view these images as we do not have a cable computer here in the office.  I first examined her in September and felt that symptoms were most consistent with an impingement syndrome/rotator cuff arthropathy.  Offered shoulder injection at that time but she deferred.  She is amenable to having an injection now.  She also notes that she will be getting insurance on February 1 and would be open to more dedicated imaging and further workup at that time.  Diabetes Type 2 Unfortunately, due to her uninsured status, she has not been on her Ozempic  in several weeks.  She was able to pay out-of-pocket and get some freestyle Libre 3s.  She shows me her data today.  It looks like her baseline glucose is hanging out around 150-180 but she does have some significant excursions to the high 200s to mid 300s post-prandially.  She is not currently taking anything for diabetes but does tell her that she has some metformin  at home that she could start until her insurance kicks in on February 1.  Healthcare Maintenance She is excited to be getting insurance back on February 1.  Is amenable to participating mammogram and colonoscopy once this kicks in.  OBJECTIVE:   BP 120/72   Pulse 97   Ht 5' 9 (1.753 m)   Wt 296 lb 12.8 oz (134.6 kg)   SpO2 100%   BMI 43.83 kg/m   Gen: well appearing and NAD  MSK: R shoulder without deformity or overlying skin changes.  She does have tenderness anteriorly over the subacromial space and AC joint.  There is likewise some tenderness to palpation over the subacromial space posteriorly. She has FROM in  all planes of motion but with a painful arc with abduction. + Neers.   ASSESSMENT/PLAN:   Chronic right shoulder pain Continue to believe that she likely has an impingement syndrome versus rotator cuff arthropathy.  She is now amenable to a steroid injection therefore after informed consent was obtained, a steroid injection was performed using a posterior approach using 4mL 1% plain Lidocaine  and 40 mg of depomedrol. This was well tolerated.   Diabetes mellitus (HCC) Her A1c is historicallly quite well-controlled (6.3% most recently).  I am hopeful we will be able to restart her GLP-1 agonist when her insurance kicks back in on February 1.  Given she is having some fairly significant blood pressure excursions into the 300s postprandially, I do think it is worthwhile having her restart her home metformin  for the next few weeks until we can get her back on her Ozempic . -Restart metformin , initially 500 mg a day and self titrate up to a max of 2000 mg daily -She will reach back out once her insurance kicks in and we will restart her Ozempic  at that time -Informed her that she may experience an increase in her blood sugars after the steroid injection today.   Healthcare Maintenance We will get the ball rolling on cancer screening in anticipation of her insurance kicking in on February 1.  Will go ahead and order a mammogram that she  can schedule and place a referral to GI for colonoscopy in the future.  JINNY Con Gasman, MD [facility]

## 2023-03-14 NOTE — Assessment & Plan Note (Signed)
 Her A1c is historicallly quite well-controlled (6.3% most recently).  I am hopeful we will be able to restart her GLP-1 agonist when her insurance kicks back in on February 1.  Given she is having some fairly significant blood pressure excursions into the 300s postprandially, I do think it is worthwhile having her restart her home metformin  for the next few weeks until we can get her back on her Ozempic . -Restart metformin , initially 500 mg a day and self titrate up to a max of 2000 mg daily -She will reach back out once her insurance kicks in and we will restart her Ozempic  at that time -Informed her that she may experience an increase in her blood sugars after the steroid injection today.

## 2023-03-15 ENCOUNTER — Ambulatory Visit: Payer: No Typology Code available for payment source | Admitting: Pharmacist

## 2023-03-26 ENCOUNTER — Other Ambulatory Visit: Payer: Self-pay | Admitting: Student

## 2023-03-26 ENCOUNTER — Other Ambulatory Visit (HOSPITAL_COMMUNITY): Payer: Self-pay

## 2023-03-26 MED ORDER — LAMOTRIGINE 100 MG PO TABS
200.0000 mg | ORAL_TABLET | Freq: Every day | ORAL | 2 refills | Status: DC
  Filled 2023-03-26: qty 60, 30d supply, fill #0
  Filled 2023-05-01 (×2): qty 60, 30d supply, fill #1
  Filled 2023-05-31: qty 60, 30d supply, fill #2

## 2023-03-26 MED ORDER — MELOXICAM 7.5 MG PO TABS
7.5000 mg | ORAL_TABLET | Freq: Every day | ORAL | 3 refills | Status: DC
  Filled 2023-03-26: qty 30, 30d supply, fill #0
  Filled 2023-04-23: qty 30, 30d supply, fill #1
  Filled 2023-05-24: qty 30, 30d supply, fill #2
  Filled 2023-06-20: qty 30, 30d supply, fill #3

## 2023-03-28 ENCOUNTER — Telehealth: Payer: Self-pay

## 2023-03-28 NOTE — Telephone Encounter (Signed)
Patient calls nurse line reporting UTI symptoms.   She reports symptoms started on Thursday with dark urine and dysuria. She reports a thick "sticky" discharge, however denies any irritation or odor.   She reports her CGM noted her sugar to be 315 this morning. She reports she "always" has UTI symptoms when her sugar is high.   She denise any vision changes, frequent urination, dizziness or fatigue.  She does report being more thirsty than usual.   She reports compliance with Metformin.   Patient scheduled for tomorrow for evaluation.   ED precautions discussed in the meantime.

## 2023-03-29 ENCOUNTER — Ambulatory Visit: Payer: No Typology Code available for payment source | Admitting: Family Medicine

## 2023-03-29 ENCOUNTER — Other Ambulatory Visit (HOSPITAL_COMMUNITY): Payer: Self-pay

## 2023-04-04 ENCOUNTER — Other Ambulatory Visit: Payer: Self-pay | Admitting: Student

## 2023-04-04 ENCOUNTER — Encounter: Payer: No Typology Code available for payment source | Admitting: Neurology

## 2023-04-04 ENCOUNTER — Other Ambulatory Visit (HOSPITAL_COMMUNITY): Payer: Self-pay

## 2023-04-05 ENCOUNTER — Other Ambulatory Visit (HOSPITAL_COMMUNITY): Payer: Self-pay

## 2023-04-05 MED ORDER — VENLAFAXINE HCL ER 150 MG PO CP24
150.0000 mg | ORAL_CAPSULE | Freq: Every day | ORAL | 3 refills | Status: DC
  Filled 2023-04-05: qty 30, 30d supply, fill #0
  Filled 2023-05-05 – 2023-05-07 (×2): qty 30, 30d supply, fill #1
  Filled 2023-06-20: qty 30, 30d supply, fill #2
  Filled 2023-07-22 – 2023-07-23 (×2): qty 30, 30d supply, fill #3

## 2023-04-09 ENCOUNTER — Telehealth: Payer: Self-pay

## 2023-04-09 NOTE — Telephone Encounter (Signed)
Patient calls nurse line reporting continued shoulder pain.   She reports the shoulder pain has now traveled down to her hand.  She reports she has been icing her shoulder and taking Meloxicam, however does not feel these measures are working anymore.   She has an apt with PCP tomorrow morning and would like to move forward with imaging.   She is unsure what else she can take for pain along with Meloxicam.   Advised will forward to PCP.

## 2023-04-10 ENCOUNTER — Ambulatory Visit
Admission: RE | Admit: 2023-04-10 | Discharge: 2023-04-10 | Disposition: A | Discharge status: Home / Self Care | Payer: No Typology Code available for payment source | Source: Ambulatory Visit | Attending: Family Medicine | Admitting: Family Medicine

## 2023-04-10 ENCOUNTER — Telehealth: Payer: Self-pay

## 2023-04-10 ENCOUNTER — Other Ambulatory Visit (HOSPITAL_COMMUNITY): Payer: Self-pay

## 2023-04-10 ENCOUNTER — Ambulatory Visit (INDEPENDENT_AMBULATORY_CARE_PROVIDER_SITE_OTHER): Payer: No Typology Code available for payment source | Admitting: Student

## 2023-04-10 ENCOUNTER — Encounter: Payer: Self-pay | Admitting: Student

## 2023-04-10 VITALS — BP 136/86 | HR 86 | Ht 68.0 in | Wt 290.8 lb

## 2023-04-10 DIAGNOSIS — E11628 Type 2 diabetes mellitus with other skin complications: Secondary | ICD-10-CM

## 2023-04-10 DIAGNOSIS — Z1211 Encounter for screening for malignant neoplasm of colon: Secondary | ICD-10-CM

## 2023-04-10 DIAGNOSIS — M6790 Unspecified disorder of synovium and tendon, unspecified site: Secondary | ICD-10-CM

## 2023-04-10 DIAGNOSIS — Z794 Long term (current) use of insulin: Secondary | ICD-10-CM

## 2023-04-10 DIAGNOSIS — G8929 Other chronic pain: Secondary | ICD-10-CM

## 2023-04-10 DIAGNOSIS — M25511 Pain in right shoulder: Secondary | ICD-10-CM

## 2023-04-10 LAB — POCT GLYCOSYLATED HEMOGLOBIN (HGB A1C): HbA1c, POC (controlled diabetic range): 8.6 % — AB (ref 0.0–7.0)

## 2023-04-10 MED ORDER — MOUNJARO 5 MG/0.5ML ~~LOC~~ SOAJ
5.0000 mg | SUBCUTANEOUS | 1 refills | Status: DC
  Filled 2023-04-10 – 2023-04-23 (×2): qty 2, 28d supply, fill #0

## 2023-04-10 NOTE — Assessment & Plan Note (Signed)
A1c is 8.6% today.  Up from 6.3%. -Increase metformin to 1000 mg twice daily -Start Mounjaro at 5 mg/week, follow-up in 4 weeks

## 2023-04-10 NOTE — Patient Instructions (Addendum)
 Please go to [facility] at [facility] to get your X-Ray done. They are open 7:30a-5p Monday-Friday. You do not need an appointment to get this done.   If this is negative, we will proceed with MRI.   Let's increase your Metformin  to 1000mg  twice daily. I'd also like tos tart you on Mounjaro  5mg /week.   Splinting that spot on your finger may help to reduce the irritation and pain. You can buddy tape the fingers together, is a palm splint like we showed you, or buy a prefabricated ulnar gutter splint.   The GI docs will call you for your colonoscopy  J Con Gasman, MD

## 2023-04-10 NOTE — Telephone Encounter (Signed)
Patient calls nurse line to inform Dr. Marisue Humble that she went and got shoulder X-ray.   Results still pending.   FYI  Veronda Prude, RN

## 2023-04-10 NOTE — Assessment & Plan Note (Signed)
 Remained most concerning for rotator cuff arthropathy versus impingement versus AC pathology.  Unfortunately did not respond as well as I had hoped to a subacromial steroid injection at our last visit. -X-ray today, if the x-ray is unrevealing, will need MRI

## 2023-04-10 NOTE — Progress Notes (Signed)
 SUBJECTIVE:   CHIEF COMPLAINT / HPI:   Diabetes A1c is up to 8.6% from 6.3% previously. This is almost certainly due to the fact that she has been without her Ozempic  for quite some time due to lack of insurance. She DOES now have insurance as of 04/07/23 and is eager to get back on a GLP-1a. Interested in transitioning to Mounjaro  due to intolerance of higher doses of Ozempic .  She has only been taking metformin  1000mg  daily for the past several weeks. She actually likes metformin  as she feels it helps with her bowel movement regularity.    Shoulder Pain Chronic issue. Seen by me a few times now. Tried a shoulder injection with no real response.  We have not yet pursued imaging due to her lack of insurance.  Continues to be quite bothersome to her.  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  Finger Nodule Has noticed a tender nodule on her left palm, just at the base of the small finger.  This feel that she has been using this hand more recently than she has in the past due to some changes at work.  She denies any triggering events.  Has not really tried anything for this.  Healthcare maintenance As she now has insurance, is eager to get her colonoscopy taken care of.  She has an appointment for her mammogram tomorrow.  OBJECTIVE:   BP 136/86   Pulse 86   Ht 5' 8 (1.727 m)   Wt 290 lb 12.8 oz (131.9 kg)   SpO2 100%   BMI 44.22 kg/m   Gen: alert, engaged, NAD  HENT: MMM Pulm: Normal WOB on RA, speaking in full sentences  Ext: R shoulder remains without obvious deformity of overlying skin changes. There is tenderness over the [facility] joint and all around the subacromial space anteriorly and posteriorly. FROM but with + painful arc. + Neers.  Left hand with palpable, small (~2-2.5 mm) firm and tender nodule along the flexor tendon sheath in the region of the A1 pulley on the fifth finger.  ASSESSMENT/PLAN:   Assessment & Plan Type 2  diabetes mellitus with other skin complication, with long-term current use of insulin  (HCC) A1c is 8.6% today.  Up from 6.3%. -Increase metformin  to 1000 mg twice daily -Start Mounjaro  at 5 mg/week, follow-up in 4 weeks Chronic right shoulder pain Remained most concerning for rotator cuff arthropathy versus impingement versus AC pathology.  Unfortunately did not respond as well as I had hoped to a subacromial steroid injection at our last visit. -X-ray today, if the x-ray is unrevealing, will need MRI Nodule of flexor tendon sheath Exam is consistent with a flexor tendon sheath nodule similar to trigger finger pathology, though note that she is not having any triggering events.  Discussed the options for steroid injection versus attempting splinting to see if we can get the inflammation to go down.  She opts to attempt splinting.  I showed her a number of options including using a tape to palmar splint, buddy taping (noting that this is the least efficacious option for her) and trying a prefabricated ulnar gutter splint.  She intends to purchase an ulnar gutter splint off of [employer]. -4-week trial splinting, if no response, will try an injection Colon cancer screening Now that she has insurance, will defer it has getting her colonoscopy taken care of. -Referral to GI placed today    J Con Gasman, MD [facility]

## 2023-04-11 ENCOUNTER — Ambulatory Visit
Admission: RE | Admit: 2023-04-11 | Discharge: 2023-04-11 | Disposition: A | Discharge status: Home / Self Care | Payer: No Typology Code available for payment source | Source: Ambulatory Visit | Attending: Family Medicine | Admitting: Family Medicine

## 2023-04-11 DIAGNOSIS — Z1231 Encounter for screening mammogram for malignant neoplasm of breast: Secondary | ICD-10-CM

## 2023-04-13 ENCOUNTER — Telehealth: Payer: Self-pay

## 2023-04-13 ENCOUNTER — Encounter: Payer: Self-pay | Admitting: Student

## 2023-04-13 ENCOUNTER — Other Ambulatory Visit (HOSPITAL_COMMUNITY): Payer: Self-pay

## 2023-04-13 DIAGNOSIS — G8929 Other chronic pain: Secondary | ICD-10-CM

## 2023-04-13 NOTE — Telephone Encounter (Signed)
 Pharmacy Patient Advocate Encounter   Received notification from Patient Advice Request messages that prior authorization for MOUNJARO  is required/requested.   Insurance verification completed.   The patient is insured through [facility] .   PA required; PA submitted to above mentioned insurance via CoverMyMeds Key/confirmation #/[id number]. Status is pending

## 2023-04-13 NOTE — Telephone Encounter (Signed)
 Patient calls nurse line for multiple concerns.   (1) She is requesting xray results. Discussed these results are still not back yet and discussed the delay.   (2) She requests her mammogram results. Results discussed and screening mammogram in one year.   (3) She requests an update on Mounjaro . I have sent our pharmacy team a message. Advised patient we are seeing 2-3 week turn around time.   (4) She reports she is taking herself out of work until automatic data are back and plan established. She reports she is using her vacation time and nothing is needed of us  at this time.

## 2023-04-16 NOTE — Telephone Encounter (Signed)
 Pharmacy Patient Advocate Encounter  Received notification from [facility] that Prior Authorization for MOUNJARO  has been DENIED.  Full denial letter will be uploaded to the media tab. See denial reason below.    PA #/Case ID/Reference #: [id number]

## 2023-04-17 ENCOUNTER — Encounter: Payer: Self-pay | Admitting: Family Medicine

## 2023-04-18 ENCOUNTER — Other Ambulatory Visit (HOSPITAL_COMMUNITY): Payer: Self-pay

## 2023-04-18 ENCOUNTER — Other Ambulatory Visit: Payer: Self-pay | Admitting: Student

## 2023-04-18 ENCOUNTER — Encounter: Payer: Self-pay | Admitting: Neurology

## 2023-04-18 ENCOUNTER — Ambulatory Visit (INDEPENDENT_AMBULATORY_CARE_PROVIDER_SITE_OTHER): Payer: No Typology Code available for payment source | Admitting: Neurology

## 2023-04-18 ENCOUNTER — Encounter: Payer: Self-pay | Admitting: Student

## 2023-04-18 ENCOUNTER — Ambulatory Visit
Admission: RE | Admit: 2023-04-18 | Discharge: 2023-04-18 | Disposition: A | Discharge status: Home / Self Care | Payer: No Typology Code available for payment source | Source: Ambulatory Visit | Attending: Family Medicine | Admitting: Family Medicine

## 2023-04-18 VITALS — BP 116/74 | HR 90 | Ht 68.0 in | Wt 292.0 lb

## 2023-04-18 DIAGNOSIS — G4733 Obstructive sleep apnea (adult) (pediatric): Secondary | ICD-10-CM | POA: Diagnosis not present

## 2023-04-18 DIAGNOSIS — G8929 Other chronic pain: Secondary | ICD-10-CM

## 2023-04-18 DIAGNOSIS — G47419 Narcolepsy without cataplexy: Secondary | ICD-10-CM

## 2023-04-18 DIAGNOSIS — E66813 Obesity, class 3: Secondary | ICD-10-CM

## 2023-04-18 DIAGNOSIS — E119 Type 2 diabetes mellitus without complications: Secondary | ICD-10-CM

## 2023-04-18 DIAGNOSIS — G4719 Other hypersomnia: Secondary | ICD-10-CM | POA: Diagnosis not present

## 2023-04-18 DIAGNOSIS — Z6841 Body Mass Index (BMI) 40.0 and over, adult: Secondary | ICD-10-CM

## 2023-04-18 DIAGNOSIS — F515 Nightmare disorder: Secondary | ICD-10-CM

## 2023-04-18 LAB — HM DIABETES EYE EXAM

## 2023-04-18 MED ORDER — METFORMIN HCL ER 500 MG PO TB24
1000.0000 mg | ORAL_TABLET | Freq: Two times a day (BID) | ORAL | 2 refills | Status: DC
  Filled 2023-04-18 (×2): qty 360, 90d supply, fill #0
  Filled 2023-07-13: qty 360, 90d supply, fill #1
  Filled 2023-10-23: qty 360, 90d supply, fill #2

## 2023-04-18 MED ORDER — VENLAFAXINE HCL ER 75 MG PO CP24
75.0000 mg | ORAL_CAPSULE | Freq: Every day | ORAL | 3 refills | Status: AC
  Filled 2023-04-18: qty 90, 90d supply, fill #0
  Filled 2023-06-25: qty 90, 90d supply, fill #1
  Filled 2023-10-23: qty 90, 90d supply, fill #2
  Filled 2024-02-11: qty 90, 90d supply, fill #3

## 2023-04-18 NOTE — Patient Instructions (Signed)
An auto titration CPAP device between 6 and 15 cm water pressure should be used with 3 cm expiratory pressure relief, heated humidification and a mask to be fitted.

## 2023-04-18 NOTE — Progress Notes (Signed)
 Provider:  Melvyn Novas, MD  Primary Care Physician:  Alicia Amel, MD [address] [place] [address]     Referring Provider: Alicia Amel, Md [address] [place],  [place] [address]          Chief Complaint according to patient   Patient presents with:     New HST follow up- established CPAP user.            HISTORY OF PRESENT ILLNESS:  [name] [name] is a 48 y.o. female patient who is here for revisit 04/18/2023 .  Chief concern according to patient :  I now have insurance - [employer]   Here to follow up on CPAP and recent sleep study: [name] [name] underwent a home sleep test by [facility] on 11/15-2024.   [name] [name] is a 48 y.o. female patient who is seen upon referral on 12/27/2022 from Dr Marisue Humble  for a new evaluation of sleepiness, after a nearly 7 year hiatus.  She has undergone CPAP titrations, MSLT and a split night had been ordered in 02-2016 at [facility] - she did not qualify for a new CPAP as her AHI was 0. She continued to use a machine however- The patient is using a Estate agent. Her 30 day compliance shows 70% compliance but only 57% with over 4 hours of nightly use. Average usage for days on CPAP of 7 hours and 17 minutes but there were 9 days without using CPAP. This month was unusual because she had struggled with a sinus infection. The mean pressure on her CPAP machine is 13.8 cm water, average pressure for 90% of user time is 15.8 cm water and the residual average AHI is 3.3 per hour.      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 Excessive daytime sleepiness, HTN , nasal congestion and obesity BMI 40..   As outlined above I think she has very high risk factors for still having apnea at least obesity hypoventilation and upper airway  resistance syndrome.  I like for her to undergo a home sleep test to screen for apnea.   If the AHI is 5 or above I will treat this apnea until we can see if it reduces her Epworth score or not.  Should her hypersomnia persist, I would be willing to invite her for a PSG and MSLT to follow. The patient is to factor not a shift worker and currently she does not carries a diagnosis of obstructive sleep apnea so I am restricted in giving her stimulant medications that could help her not to fall asleep and especially make her commute safer. Modafinil is usually used for patients but they need an underlying diagnosis.        Epworth sleepiness score:  21/ 24 points  FSS endorsed at 54/ 63 points.  In 2017 , her ESS was 17/24 points  ,  Fatigue severity score 46/63   , depression score 3/15   BMI:  44.09 kg/m   Neck Circumference: 17"   FINDINGS:   Sleep Summary:   Total Recording Time (hours, min):   8 hours 49 minutes  Total Sleep Time (hours, min):    6 hours 58 minutes  Percent REM (%):    21.4%                                    Respiratory Indices by AASM criteria:   Calculated pAHI (per hour): 16.1/h   without central apneas being recognized.                        REM pAHI:   14.4/h                                              NREM pAHI: 16.6/h                            Supine AHI: The patient slept almost exclusively supine with an AHI of 16.2/h  ,  AHI on her on her left side was 14.3/h , only present for 22 minutes.   Snoring was present for less than 20% of the recorded sleep time and reached a mean volume of 41 dB.                                              Oxygen  saturation Statistics:    O2 Saturation Range (%): Between the nadir at 87% with a maximum saturation of 99% with a mean saturation of 93%                                      O2 Saturation (minutes) <89%: 0 minutes         Pulse Rate Statistics:   Pulse Mean (bpm):   79 bpm               Pulse Range:      Between 65 and 115 bpm           IMPRESSION:  This HST confirms the presence of mild to moderate obstructive sleep apnea which was not REM sleep dependent. There was no associated hypoxia and no bradycardia.   RECOMMENDATION: I would recommend to use CPAP therapy even for this moderate degree of sleep apnea to improve daytime sleepiness.  An auto titration CPAP device between 5 and 15 cm water pressure should be used with 2 cm expiratory pressure relief, heated humidification and a mask to be fitted.   I will also add the generic form of Provigil to her medication regimen at 200 mg a day.  These are tablets that the patient can break in half if she needs a lower dose.  The diagnosis of obstructive sleep apnea allows me to prescribe this medication for patients with residual hypersomnia.     INTERPRETING PHYSICIAN:    Melvyn Novas, MD  [facility] and [facility] Board certified by The [employer] of Sleep Medicine and Diplomate of the [employer] of Sleep Medicine. Board certified In Neurology through the ABPN, Fellow of the [employer] of Neurology.  She was referred by Dr. Marisue Humble for evaluation of sleepiness.  This AHI was 16.1 without any central apneas.  REM AHI was 14.4, non-REM AHI 16.6/h, the patient slept almost exclusively supine, 20% of the recording indicated the presence of snoring with a mean volume of 41 dB.  The oxygen nadir was 87.  This was mild borderline moderate sleep apnea obstructive in origin without associated hypoxia and bradycardia was also not seen.  She is using a machine that she was given, this is not a machine based on our prescription however it is working fine she has been 97% compliant with an average of 6 hours 17 minutes, her set pressure here is 9 cm water was 2 cm water EPR and her AHI is 2.6/h.    There are still some obstructive  residual apneas but not many air leaks.  It seems that air leaks only occur when the patient is losing contact with the mask but not at baseline.  So given her special situation and insurance status, I would like to increase this CPAP ( an S9 Elite to 10 cm water ) unless we find a way to finance for her a new auto titration CPAP.       [name] [name] is a 48 y.o. female patient who is seen upon referral on 12/27/2022 from Dr Marisue Humble  for a new evaluation of sleepiness, after a nearly 7 year hiatus.  She has undergone CPAP titrations, MSLT and a split night had been ordered in 02-2016 at [facility] - she did not qualify for a new CPAP as her AHI was 0.   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    I have the pleasure of seeing [name] [name] on 12/27/22 a right -handed AA female with a concern of daytime sleepiness.  The patient had a sleep study with me in the year 2017.    Review of Systems: Out of a complete 14 system review, the patient complains of only the following symptoms, and all other reviewed systems are negative.:    How likely are you to doze in the following situations: 0 = not likely, 1 = slight chance, 2 = moderate chance, 3 = high chance   Sitting and Reading? Watching Television? Sitting inactive in a public place (theater or meeting)? As a passenger in a car for an hour without a break? Lying down in the afternoon when circumstances permit? Sitting and talking to someone? Sitting quietly after lunch without alcohol? In a car, while stopped for a few minutes in traffic?   Total = 11/ 24 points   FSS endorsed at XXX/ 63 points.   She is currently out of work due to a shoulder problem.  DM>    12-27-2022 : Total = 21/ 24 points   FSS endorsed at 54/ 63 points.      In 2017 ESS was 17 ,  Fatigue severity  score 46  , depression score 3/15  Social History   Socioeconomic History   Marital status: Single    Spouse name: Not on file   Number of children: 3   Years of education: Not on file   Highest education level: Not on file  Occupational History   Not on file  Tobacco Use   Smoking status: Never   Smokeless tobacco:  Never  Vaping Use   Vaping status: Not on file  Substance and Sexual Activity   Alcohol use: Yes    Alcohol/week: 0.0 standard drinks of alcohol    Comment: occ   Drug use: No   Sexual activity: Not on file  Other Topics Concern   Not on file  Social History Narrative   Pt lives with son    Pt not working    Social Drivers of Corporate investment banker Strain: Not on file  Food Insecurity: Not on file  Transportation Needs: Not on file  Physical Activity: Not on file  Stress: Not on file  Social Connections: Unknown (07/19/2021)   Received from [employer]   Social Network    Social Network: Not on file    Family History  Problem Relation Age of Onset   Hypertension Mother    Diabetes Mellitus II Mother    Congestive Heart Failure Father    Hypertension Father    Diabetes Mellitus II Father    Sleep apnea Father    Sleep apnea Son     Past Medical History:  Diagnosis Date   Allergy    Anemia    Bipolar 1 disorder (HCC)    Sees Dr. Ledon Snare, psychology,  [phone]   Carpal tunnel syndrome    Depression    Diabetes mellitus without complication (HCC)    Family history of adverse reaction to anesthesia    mother & son have difficulty waking   Fatigue    Ganglion of left wrist 07/26/2018   Lung nodule seen on imaging study 09/24/2013   Sleep apnea    uses cpap    Past Surgical History:  Procedure Laterality Date   ABDOMINAL HYSTERECTOMY     CHOLECYSTECTOMY N/A 06/22/2017   Procedure: LAPAROSCOPIC CHOLECYSTECTOMY;  Surgeon: Rodman Pickle, MD;  Location: [facility] OR;  Service: General;  Laterality: N/A;   I & D  EXTREMITY Right 09/14/2015   Procedure: IRRIGATION AND DEBRIDEMENT RIGHT MIDDLE FINGER;  Surgeon: Dominica Severin, MD;  Location: [facility] OR;  Service: Orthopedics;  Laterality: Right;   TUBAL LIGATION       Current Outpatient Medications on File Prior to Visit  Medication Sig Dispense Refill   cetirizine (ZYRTEC) 10 MG tablet Take 1 tablet (10 mg total) by mouth daily. 30 tablet 11   Continuous Glucose Sensor (FREESTYLE LIBRE 3 PLUS SENSOR) MISC Change sensor every 15 days. 2 each 11   lamoTRIgine (LAMICTAL) 100 MG tablet Take 2 tablets (200 mg total) by mouth daily. 60 tablet 2   meloxicam (MOBIC) 7.5 MG tablet Take 1 tablet (7.5 mg total) by mouth daily. 30 tablet 3   metFORMIN (GLUCOPHAGE-XR) 500 MG 24 hr tablet Take 1 tablet (500 mg total) by mouth daily with breakfast.     modafinil (PROVIGIL) 200 MG tablet Take 0.5 tablets (100 mg total) by mouth daily. 45 tablet 2   tirzepatide (MOUNJARO) 5 MG/0.5ML Pen Inject 5 mg into the skin once a week. 2 mL 1   venlafaxine XR (EFFEXOR-XR) 150 MG 24 hr capsule Take 1 capsule (150 mg total) by mouth daily. 30 capsule 3   glucose blood (ONETOUCH VERIO) test strip Use to check blood sugar 3 times daily 100 each 3   Lancet Devices (ONE TOUCH DELICA LANCING DEV) MISC Use to check blood sugar 3 times daily 1 each 0   OneTouch Delica Lancets 33G MISC Use to check blood sugar 3 times daily 100 each 3  No current facility-administered medications on file prior to visit.    Allergies  Allergen Reactions   Hydromorphone Hcl Anaphylaxis and Hives   Meperidine Hcl Anaphylaxis and Hives   Morphine Anaphylaxis and Hives    Cannot take "anything in this family"    Gramineae Pollens Other (See Comments)    Stuffy nose   Lisinopril Cough   Sumatriptan Other (See Comments)    Makes headaches worse.    Tape Rash    EKG LEADS BLISTER THE PATIENT'S SKIN AND LEAVE RED, RAISED AREAS!!   Zolmitriptan Other (See Comments)    Headache     DIAGNOSTIC DATA (LABS,  IMAGING, TESTING) - I reviewed patient records, labs, notes, testing and imaging myself where available.  Lab Results  Component Value Date   WBC 6.2 12/30/2020   HGB 11.6 12/30/2020   HCT 35.0 12/30/2020   MCV 85 12/30/2020   PLT 258 12/30/2020      Component Value Date/Time   NA 142 10/16/2022 1653   K 3.9 10/16/2022 1653   CL 103 10/16/2022 1653   CO2 26 10/16/2022 1653   GLUCOSE 165 (A) 03/02/2023 1452   BUN 11 10/16/2022 1653   CREATININE 0.72 10/16/2022 1653   CREATININE 0.66 11/09/2015 1617   CALCIUM 9.8 10/16/2022 1653   PROT 6.6 12/30/2020 1438   ALBUMIN 4.1 12/30/2020 1438   AST 13 12/30/2020 1438   ALT 12 12/30/2020 1438   ALKPHOS 97 12/30/2020 1438   BILITOT <0.2 12/30/2020 1438   GFRNONAA 91 10/27/2019 2005   GFRNONAA >89 06/24/2012 0919   GFRAA 105 10/27/2019 2005   GFRAA >89 06/24/2012 0919   Lab Results  Component Value Date   CHOL 220 (H) 10/16/2022   HDL 67 10/16/2022   LDLCALC 117 (H) 10/16/2022   LDLDIRECT 130 (H) 08/30/2009   TRIG 207 (H) 10/16/2022   CHOLHDL 3.3 10/16/2022   Lab Results  Component Value Date   HGBA1C 8.6 (A) 04/10/2023   Lab Results  Component Value Date   VITAMINB12 769 08/19/2020   Lab Results  Component Value Date   TSH 2.280 12/30/2020    PHYSICAL EXAM:  Today's Vitals   04/18/23 0828  BP: 116/74  Pulse: 90  Weight: 292 lb (132.5 kg)  Height: 5\' 8"  (1.727 m)   Body mass index is 44.4 kg/m.   Wt Readings from Last 3 Encounters:  04/18/23 292 lb (132.5 kg)  04/10/23 290 lb 12.8 oz (131.9 kg)  03/13/23 296 lb 12.8 oz (134.6 kg)     Ht Readings from Last 3 Encounters:  04/18/23 5\' 8"  (1.727 m)  04/10/23 5\' 8"  (1.727 m)  03/13/23 5\' 9"  (1.753 m)        NEUROLOGIC EXAM:The patient is awake, alert and appears not in acute distress. The patient is well groomed. Head: Normocephalic, atraumatic. Neck is supple.  Mallampati 3,  neck circumference:17 inches . Nasal airflow barley patent.  Voice is  hoarse. Dental status: biological  Cardiovascular:  Regular rate and cardiac rhythm by pulse,  without distended neck veins. Respiratory: Lungs are clear to auscultation.  Skin:  With evidence of ankle edema,no rash. Trunk: The patient's posture is erect.   NEUROLOGIC EXAM: The patient is tired, fatigued  oriented to place and time.   Memory subjective described as intact.  Attention span & concentration ability appears normal.  Speech is fluent,  with dysphonia .  Mood and affect are appropriate.   Cranial nerves: no loss of smell or taste reported  Pupils are equal and briskly reactive to light. Funduscopic exam deferred.  Extraocular movements in vertical and horizontal planes were intact and without nystagmus. No Diplopia. Visual fields by finger perimetry are intact. Hearing was intact to soft voice and finger rubbing.    Facial sensation intact to fine touch.  Facial motor strength is symmetric and tongue and uvula move midline.  Neck ROM : rotation, tilt and flexion extension were normal for age and shoulder shrug was symmetrical.    Motor exam:  Symmetric bulk, tone and ROM.   Normal tone without cog wheeling, symmetric grip strength .    ASSESSMENT AND PLAN 48 y.o. year old female  here with:    1)  excellent compliance on the old ELITE S 9 machine , but needs replacement ordering a new autotitration CPAP>    I would recommend to use CPAP therapy even for this moderate degree of sleep apnea to improve daytime sleepiness.  An auto titration CPAP device between 5 and 15 cm water pressure should be used with 2 cm expiratory pressure relief, heated humidification and a mask to be fitted.   I will also add the generic form of Provigil to her medication regimen at 200 mg a day.  These are tablets that the patient can break in half if she needs a lower dose.  The diagnosis of obstructive sleep apnea allows me to prescribe this medication for patients with residual  hypersomnia.   I plan to follow up either personally or through our NP within 6 months.   I would like to thank  Alicia Amel, Md [address] [place],  [place] [address] for allowing me to meet with and to take care of this pleasant patient.    After spending a total time of  30  minutes face to face and additional time for physical and neurologic examination, review of laboratory studies,  personal review of imaging studies, reports and results of other testing and review of referral information / records as far as provided in visit,   Electronically signed by: Melvyn Novas, MD 04/18/2023 8:47 AM  [facility] and [facility] Board certified by The [employer] of Sleep Medicine and Diplomate of the [employer] of Sleep Medicine. Board certified In Neurology through the ABPN, Fellow of the [employer] of Neurology.

## 2023-04-19 ENCOUNTER — Other Ambulatory Visit: Payer: No Typology Code available for payment source

## 2023-04-19 ENCOUNTER — Encounter (HOSPITAL_COMMUNITY): Payer: Self-pay

## 2023-04-19 ENCOUNTER — Other Ambulatory Visit (HOSPITAL_COMMUNITY): Payer: Self-pay

## 2023-04-23 ENCOUNTER — Other Ambulatory Visit: Payer: Self-pay

## 2023-04-23 ENCOUNTER — Other Ambulatory Visit (HOSPITAL_COMMUNITY): Payer: Self-pay

## 2023-04-23 ENCOUNTER — Other Ambulatory Visit: Payer: No Typology Code available for payment source

## 2023-04-27 NOTE — Addendum Note (Signed)
 Addended by: Darnelle Spangle B on: 04/27/2023 05:24 PM   Modules accepted: Orders

## 2023-04-30 ENCOUNTER — Other Ambulatory Visit: Payer: No Typology Code available for payment source

## 2023-05-01 ENCOUNTER — Other Ambulatory Visit (HOSPITAL_COMMUNITY): Payer: Self-pay

## 2023-05-02 ENCOUNTER — Encounter: Payer: Self-pay | Admitting: Gastroenterology

## 2023-05-03 ENCOUNTER — Ambulatory Visit: Payer: No Typology Code available for payment source | Admitting: Student

## 2023-05-07 ENCOUNTER — Other Ambulatory Visit (HOSPITAL_COMMUNITY): Payer: Self-pay

## 2023-05-07 ENCOUNTER — Other Ambulatory Visit: Payer: Self-pay

## 2023-05-07 MED ORDER — METHYLPREDNISOLONE 4 MG PO TBPK
ORAL_TABLET | ORAL | 0 refills | Status: DC
  Filled 2023-05-07: qty 21, 6d supply, fill #0

## 2023-05-08 ENCOUNTER — Ambulatory Visit: Admitting: Student

## 2023-05-08 ENCOUNTER — Telehealth: Payer: Self-pay

## 2023-05-08 ENCOUNTER — Telehealth (INDEPENDENT_AMBULATORY_CARE_PROVIDER_SITE_OTHER): Payer: No Typology Code available for payment source | Admitting: Student

## 2023-05-08 VITALS — BP 143/100 | HR 99 | Ht 68.0 in | Wt 300.2 lb

## 2023-05-08 DIAGNOSIS — Z7985 Long-term (current) use of injectable non-insulin antidiabetic drugs: Secondary | ICD-10-CM

## 2023-05-08 DIAGNOSIS — R030 Elevated blood-pressure reading, without diagnosis of hypertension: Secondary | ICD-10-CM

## 2023-05-08 DIAGNOSIS — E1140 Type 2 diabetes mellitus with diabetic neuropathy, unspecified: Secondary | ICD-10-CM

## 2023-05-08 DIAGNOSIS — Z91199 Patient's noncompliance with other medical treatment and regimen due to unspecified reason: Secondary | ICD-10-CM

## 2023-05-08 MED ORDER — MOUNJARO 2.5 MG/0.5ML ~~LOC~~ SOAJ: 2.5000 mg | SUBCUTANEOUS | Status: DC

## 2023-05-08 NOTE — Patient Instructions (Signed)
 It was great to see you today! Thank you for choosing [facility] for your primary care.  Today we addressed: We will start Mounjaro with Metformin  Call if symptoms do not Improve with the change  Return in 1 month  Please come in for BP check in 1 week.   If you haven't already, sign up for My Chart to have easy access to your labs results, and communication with your primary care physician.   Please arrive 15 minutes before your appointment to ensure smooth check in process.  We appreciate your efforts in making this happen.  Thank you for allowing me to participate in your care, Alfredo Martinez, MD 05/08/2023, 11:49 AM PGY-3, [facility]

## 2023-05-08 NOTE — Progress Notes (Signed)
 Patient was a no-show to virtual visit. Attempted phone call with no answer. LVM for patient to call back and make an in-person appointment.

## 2023-05-08 NOTE — Telephone Encounter (Signed)
 Patient calls nurse line due to missing video visit this AM with Dr. Marisue Humble.   She reports that she really needed to speak with provider regarding her elevated blood sugar levels and starting new medication.   She reports that for the last two weeks she has been experiencing increased thirst, increased urination, dry mouth and visual changes. She reports that she saw ophthalmologist for this concern around the middle of February.   She has been taking metformin as prescribed. Libre readings have been measuring "high" for the last two weeks.   Advised that she should be seen in person for further evaluation as soon as possible. Patient scheduled in ATC this morning at 1130.  [name] Prude, RN

## 2023-05-08 NOTE — Assessment & Plan Note (Signed)
 Patient is at maximum dose of Metformin 1000mg  BID and has been since 03/13/23. Reviewed her glucose monitor with Dr. Raymondo Band, with some elevation in 200s-300s despite metformin over last several weeks. Steroid pack and injection definitely increased her sugars, but they were consistently elevated prior to this ~30% of the time. Will provide the patient with samples of mounjaro (4 pens). Has done well with GLP1 in past. Last A1C 4 weeks ago 8.6. Discussed with pharmacy and patient, will monitor sugars while on the [facility] and metformin and return in 1 month.   If symptoms of increased urination and thirst do not resolve with improvement of sugar control, instructed to call me.

## 2023-05-08 NOTE — Progress Notes (Addendum)
    SUBJECTIVE:   CHIEF COMPLAINT / HPI:   Hyperglycemia:  Per nursing note:  "She reports that she really needed to speak with provider regarding her elevated blood sugar levels and starting new medication.    She reports that for the last two weeks she has been experiencing increased thirst, increased urination, dry mouth and visual changes. She reports that she saw ophthalmologist for this concern around the middle of February.    She has been taking metformin as prescribed. Libre readings have been measuring "high" for the last two weeks."  - Patient was denied Mounjaro from insurance  - has been stressed from the elevated sugars on her monitor  - consistently elevated sugars over last month - received steroid injection and steroid pack from [facility] yesterday for shoulder pain - denies chest pain, shortness of breath, fever, chills - On Metformin 1000mg  BID with meals  - previously on Ozempic but not covered by insurance so was stopped   PERTINENT  PMH / PSH: Reviewed   OBJECTIVE:   BP (!) 143/100   Pulse 99   Ht 5\' 8"  (1.727 m)   Wt (!) 300 lb 3.2 oz (136.2 kg)   SpO2 100%   BMI 45.65 kg/m   General: Alert and oriented in no apparent distress Heart: Regular rate and rhythm with no murmurs appreciated Lungs: Normal WOB Abdomen: no abdominal pain Skin: Warm and dry   ASSESSMENT/PLAN:   Assessment & Plan Type 2 diabetes mellitus with diabetic neuropathy, unspecified whether long term insulin use (HCC) Patient is at maximum dose of Metformin 1000mg  BID and has been since 03/13/23. Reviewed her glucose monitor with Dr. Raymondo Band, with some elevation in 200s-300s despite metformin over last several weeks. Steroid pack and injection definitely increased her sugars, but they were consistently elevated prior to this ~30% of the time. Will provide the patient with samples of mounjaro (4 pens). Has done well with GLP1 in past. Last A1C 4 weeks ago 8.6. Discussed with pharmacy and  patient, will monitor sugars while on the [facility] and metformin and return in 1 month.   If symptoms of increased urination and thirst do not resolve with improvement of sugar control, instructed to call me.  Elevated BP without diagnosis of hypertension Steroid as likely cause of elevation in BP. Patient wants to finish taper. Discussed that this can increase BP and sugars temporarily. Return for nursing visit middle of next week to recheck BP after finishing steroid. Would not advise steroids in the future with comorbidities and limited usefulness of oral steroids for chronic pain. Follow up with me in 1 month.   If BP still elevated next week despite finishing steroid, discuss modafinil with neurologist.      Alfredo Martinez, MD [facility]

## 2023-05-11 ENCOUNTER — Other Ambulatory Visit (HOSPITAL_COMMUNITY): Payer: Self-pay

## 2023-05-11 ENCOUNTER — Telehealth: Payer: Self-pay | Admitting: Pharmacist

## 2023-05-11 MED ORDER — GLIPIZIDE 5 MG PO TABS
5.0000 mg | ORAL_TABLET | Freq: Two times a day (BID) | ORAL | 0 refills | Status: DC
  Filled 2023-05-11: qty 60, 30d supply, fill #0

## 2023-05-11 NOTE — Telephone Encounter (Signed)
 Phone calll from patient reporting hyperglycemia > 350 and "HI" following steroid injection and steroid dose pack initiation earlier this week.  Patient has two days remaining of the steroid dose pack.   Discussed with the PCP, Dr. Marisue Humble, and decision made to use glipizide to assist with lowering glucose during steroid-induced hyperglycemia.   New prescription provided for glipizide 5mg   Instruction are 5mg  BID if glucose remains elevated > 200 then increase to 10mg  (2 tablets) twice daily.  Decrease and stop use once glucose readings are < 200 consistently.   Attempted to reach patient multiple time to communicate plan.  No answer, left message RE plan.  Asked her to call back immediately if she had questions.

## 2023-05-14 ENCOUNTER — Telehealth: Payer: Self-pay

## 2023-05-14 NOTE — Telephone Encounter (Signed)
 Reviewed and agree with Dr Macky Lower plan.

## 2023-05-14 NOTE — Telephone Encounter (Signed)
 Patient calls nurse line requesting imaging orders.   She reports she is at [facility] and they are requesting xrays of her left shoulder and neck.   She reports her insurance will not cover imaging at their location. She reports she has to go to [facility].   She is requesting PCP to place these orders, I am assuming due to insurance as well.   Will forward to PCP.

## 2023-05-14 NOTE — Progress Notes (Unsigned)
 Office Visit Note   Patient: [name] [name]           Date of Birth: 01-Feb-1976           MRN: [id number] Visit Date: 05/15/2023              Requested by: Alicia Amel, MD [address] [place],  [place] [address] PCP: Alicia Amel, MD   Assessment & Plan: Visit Diagnoses: No diagnosis found.  Plan: ***  Follow-Up Instructions: No follow-ups on file.   Orders:  No orders of the defined types were placed in this encounter.  No orders of the defined types were placed in this encounter.     Procedures: No procedures performed   Clinical Data: No additional findings.   Subjective: No chief complaint on file.   HPI  Review of Systems   Objective: Vital Signs: There were no vitals taken for this visit.  Physical Exam  Ortho Exam  Specialty Comments:  No specialty comments available.  Imaging: No results found.   PMFS History: Patient Active Problem List   Diagnosis Date Noted   Excessive daytime sleepiness 12/27/2022   Sleep attack 12/27/2022   Nightmare disorder 12/27/2022   Snoring 12/27/2022   Chronic right shoulder pain 11/16/2022   ADHD 01/03/2022   Urine frequency 08/08/2018   OSA on CPAP 02/09/2016   Diabetes mellitus (HCC) 07/11/2012   Pompholyx eczema 11/12/2011   Vitamin D deficiency 09/20/2007   MENOPAUSE, SURGICAL 04/24/2007   Diaphragmatic hernia 11/20/2006   Hyperlipidemia 05/03/2006   Class 3 severe obesity in adult [facility]) 05/03/2006   Bipolar I disorder (HCC) 05/03/2006   ANXIETY 05/03/2006   HYPERTENSION, BENIGN SYSTEMIC 05/03/2006   PEPTIC ULCER DIS., UNSPEC. W/O OBSTRUCTION 05/03/2006   Past Medical History:  Diagnosis Date   Allergy    Anemia    Bipolar 1 disorder [facility])    Sees Dr. Ledon Snare, psychology,  [phone]   Carpal tunnel syndrome    Depression    Diabetes mellitus without complication [facility])    Family history of adverse reaction to anesthesia    mother & son have difficulty waking   Fatigue     Ganglion of left wrist 07/26/2018   Lung nodule seen on imaging study 09/24/2013   Sleep apnea    uses cpap    Family History  Problem Relation Age of Onset   Hypertension Mother    Diabetes Mellitus II Mother    Congestive Heart Failure Father    Hypertension Father    Diabetes Mellitus II Father    Sleep apnea Father    Sleep apnea Son     Past Surgical History:  Procedure Laterality Date   ABDOMINAL HYSTERECTOMY     CHOLECYSTECTOMY N/A 06/22/2017   Procedure: LAPAROSCOPIC CHOLECYSTECTOMY;  Surgeon: Rodman Pickle, MD;  [facility] OR;  Service: General;  Laterality: N/A;   I & D EXTREMITY Right 09/14/2015   Procedure: IRRIGATION AND DEBRIDEMENT RIGHT MIDDLE FINGER;  Surgeon: Dominica Severin, MD;  Location: [facility] OR;  Service: Orthopedics;  Laterality: Right;   TUBAL LIGATION     Social History   Occupational History   Not on file  Tobacco Use   Smoking status: Never   Smokeless tobacco: Never  Vaping Use   Vaping status: Not on file  Substance and Sexual Activity   Alcohol use: Yes    Alcohol/week: 0.0 standard drinks of alcohol    Comment: occ   Drug use: No  Sexual activity: Not on file

## 2023-05-14 NOTE — Telephone Encounter (Signed)
Error. See other telephone note.

## 2023-05-15 ENCOUNTER — Encounter: Payer: Self-pay | Admitting: Orthopaedic Surgery

## 2023-05-15 ENCOUNTER — Ambulatory Visit: Admitting: Orthopaedic Surgery

## 2023-05-15 ENCOUNTER — Other Ambulatory Visit (INDEPENDENT_AMBULATORY_CARE_PROVIDER_SITE_OTHER): Payer: Self-pay

## 2023-05-15 DIAGNOSIS — M542 Cervicalgia: Secondary | ICD-10-CM

## 2023-05-15 NOTE — Addendum Note (Signed)
 Addended by: Javier Glazier on: 05/15/2023 01:37 PM   Modules accepted: Orders

## 2023-05-16 ENCOUNTER — Other Ambulatory Visit (HOSPITAL_COMMUNITY): Payer: Self-pay

## 2023-05-16 LAB — ANA: Anti Nuclear Antibody (ANA): NEGATIVE

## 2023-05-16 LAB — SEDIMENTATION RATE: Sed Rate: 14 mm/h (ref 0–20)

## 2023-05-16 LAB — RHEUMATOID FACTOR: Rheumatoid fact SerPl-aCnc: <10 [IU]/mL (ref <14)

## 2023-05-16 LAB — URIC ACID: Uric Acid, Serum: 3.2 mg/dL (ref 2.5–7.0)

## 2023-05-22 ENCOUNTER — Encounter: Payer: Self-pay | Admitting: Student

## 2023-05-24 ENCOUNTER — Other Ambulatory Visit (HOSPITAL_COMMUNITY): Payer: Self-pay

## 2023-05-24 ENCOUNTER — Other Ambulatory Visit: Payer: Self-pay | Admitting: Physician Assistant

## 2023-05-24 ENCOUNTER — Telehealth: Payer: Self-pay | Admitting: Orthopaedic Surgery

## 2023-05-24 MED ORDER — TRAMADOL HCL 50 MG PO TABS
50.0000 mg | ORAL_TABLET | Freq: Two times a day (BID) | ORAL | 0 refills | Status: DC | PRN
  Filled 2023-05-24: qty 30, 15d supply, fill #0

## 2023-05-24 NOTE — Telephone Encounter (Signed)
 Sent in tramadol

## 2023-05-24 NOTE — Telephone Encounter (Signed)
 Pt requesting something for pain   [facility]/ [address]

## 2023-05-25 ENCOUNTER — Encounter: Payer: Self-pay | Admitting: Orthopaedic Surgery

## 2023-05-25 ENCOUNTER — Other Ambulatory Visit: Payer: Self-pay | Admitting: Student

## 2023-05-25 ENCOUNTER — Other Ambulatory Visit (HOSPITAL_COMMUNITY): Payer: Self-pay

## 2023-05-25 DIAGNOSIS — E11628 Type 2 diabetes mellitus with other skin complications: Secondary | ICD-10-CM

## 2023-05-26 MED ORDER — MOUNJARO 5 MG/0.5ML ~~LOC~~ SOAJ
5.0000 mg | SUBCUTANEOUS | 1 refills | Status: DC
  Filled 2023-05-26: qty 2, 28d supply, fill #0
  Filled 2023-06-20 – 2023-07-13 (×4): qty 2, 28d supply, fill #1

## 2023-05-27 ENCOUNTER — Ambulatory Visit
Admission: RE | Admit: 2023-05-27 | Discharge: 2023-05-27 | Disposition: A | Discharge status: Home / Self Care | Source: Ambulatory Visit | Attending: Orthopaedic Surgery | Admitting: Orthopaedic Surgery

## 2023-05-27 DIAGNOSIS — M542 Cervicalgia: Secondary | ICD-10-CM

## 2023-05-28 ENCOUNTER — Other Ambulatory Visit (HOSPITAL_COMMUNITY): Payer: Self-pay

## 2023-05-28 ENCOUNTER — Telehealth: Payer: Self-pay | Admitting: Orthopaedic Surgery

## 2023-05-28 NOTE — Telephone Encounter (Signed)
 Hey. I do not see message attached to this patient.   Was this an Error?

## 2023-05-29 ENCOUNTER — Ambulatory Visit: Payer: No Typology Code available for payment source

## 2023-05-29 VITALS — Ht 68.0 in | Wt 300.0 lb

## 2023-05-29 DIAGNOSIS — Z1211 Encounter for screening for malignant neoplasm of colon: Secondary | ICD-10-CM

## 2023-05-29 MED ORDER — SUFLAVE 178.7 G PO SOLR
1.0000 | Freq: Once | ORAL | 0 refills | Status: AC
Start: 2023-05-29 — End: 2023-05-29

## 2023-05-29 NOTE — Progress Notes (Signed)

## 2023-05-31 ENCOUNTER — Encounter: Payer: Self-pay | Admitting: Orthopaedic Surgery

## 2023-05-31 ENCOUNTER — Other Ambulatory Visit (HOSPITAL_COMMUNITY): Payer: Self-pay

## 2023-05-31 ENCOUNTER — Ambulatory Visit: Admitting: Orthopaedic Surgery

## 2023-05-31 DIAGNOSIS — M75111 Incomplete rotator cuff tear or rupture of right shoulder, not specified as traumatic: Secondary | ICD-10-CM

## 2023-05-31 NOTE — Progress Notes (Signed)
 Office Visit Note   Patient: [name] [name]           Date of Birth: 01-14-1976           MRN: [id number] Visit Date: 05/31/2023              Requested by: Alicia Amel, MD [address] [place],  [place] [address] PCP: Alicia Amel, MD   Assessment & Plan: Visit Diagnoses:  1. Partial nontraumatic tear of right rotator cuff     Plan: [name] is a 48 year old female with chronic right shoulder pain.  She has a fair amount of tendinopathy of her cuff.  The C-spine MRI findings are not consistent with her symptoms.  She has not gotten much relief from her prior cortisone injection.  At this point she has elected to move forward with shoulder arthroscopy with extensive debridement and likely biceps tenotomy and repair of rotator cuff as indicated.  Eunice Blase will call her to schedule surgery once we have the necessary clearances.  Total face to face encounter time was greater than 25 minutes and over half of this time was spent in counseling and/or coordination of care.  Follow-Up Instructions: No follow-ups on file.   Orders:  No orders of the defined types were placed in this encounter.  No orders of the defined types were placed in this encounter.     Procedures: No procedures performed   Clinical Data: No additional findings.   Subjective: Chief Complaint  Patient presents with   Neck - Follow-up    MRI review    HPI [name] returns today for follow-up evaluation of chronic right shoulder pain.  Recently had a C-spine MRI.  Continues to report pain that is mainly along the anterior shoulder and the lateral shoulder with activity.  Mild and occasional symptoms into the left upper extremity. Review of Systems  Constitutional: Negative.   HENT: Negative.    Eyes: Negative.   Respiratory: Negative.    Cardiovascular: Negative.   Endocrine: Negative.   Musculoskeletal: Negative.   Neurological: Negative.   Hematological: Negative.   Psychiatric/Behavioral:  Negative.    All other systems reviewed and are negative.    Objective: Vital Signs: There were no vitals taken for this visit.  Physical Exam Vitals and nursing note reviewed.  Constitutional:      Appearance: She is well-developed.  HENT:     Head: Atraumatic.     Nose: Nose normal.  Eyes:     Extraocular Movements: Extraocular movements intact.  Cardiovascular:     Pulses: Normal pulses.  Pulmonary:     Effort: Pulmonary effort is normal.  Abdominal:     Palpations: Abdomen is soft.  Musculoskeletal:     Cervical back: Neck supple.  Skin:    General: Skin is warm.     Capillary Refill: Capillary refill takes less than 2 seconds.  Neurological:     Mental Status: She is alert. Mental status is at baseline.  Psychiatric:        Behavior: Behavior normal.        Thought Content: Thought content normal.        Judgment: Judgment normal.     Ortho Exam Examination of the right shoulder shows pain with manual muscle testing of the supraspinatus infraspinatus and subscapularis.  She has generalized pain with range of motion.  She has diffuse tenderness throughout the clavicle. Specialty Comments:  No specialty comments available.  Imaging: No results found.  PMFS History: Patient Active Problem List   Diagnosis Date Noted   Excessive daytime sleepiness 12/27/2022   Sleep attack 12/27/2022   Nightmare disorder 12/27/2022   Snoring 12/27/2022   Chronic right shoulder pain 11/16/2022   ADHD 01/03/2022   Urine frequency 08/08/2018   OSA on CPAP 02/09/2016   Diabetes mellitus (HCC) 07/11/2012   Pompholyx eczema 11/12/2011   Vitamin D deficiency 09/20/2007   MENOPAUSE, SURGICAL 04/24/2007   Diaphragmatic hernia 11/20/2006   Hyperlipidemia 05/03/2006   Class 3 severe obesity in adult [facility]) 05/03/2006   Bipolar I disorder (HCC) 05/03/2006   ANXIETY 05/03/2006   HYPERTENSION, BENIGN SYSTEMIC 05/03/2006   PEPTIC ULCER DIS., UNSPEC. W/O OBSTRUCTION 05/03/2006    Past Medical History:  Diagnosis Date   Allergy    Anemia    Arthritis    Bipolar 1 disorder [facility])    Sees Dr. Ledon Snare, psychology,  [phone]   Carpal tunnel syndrome    Depression    Diabetes mellitus without complication [facility])    Family history of adverse reaction to anesthesia    mother & son have difficulty waking   Fatigue    Ganglion of left wrist 07/26/2018   Lung nodule seen on imaging study 09/24/2013   Sleep apnea    uses cpap    Family History  Problem Relation Age of Onset   Hypertension Mother    Diabetes Mellitus II Mother    Congestive Heart Failure Father    Hypertension Father    Diabetes Mellitus II Father    Sleep apnea Father    Sleep apnea Son    Colon cancer Neg Hx    Esophageal cancer Neg Hx    Rectal cancer Neg Hx    Stomach cancer Neg Hx     Past Surgical History:  Procedure Laterality Date   ABDOMINAL HYSTERECTOMY     CHOLECYSTECTOMY N/A 06/22/2017   Procedure: LAPAROSCOPIC CHOLECYSTECTOMY;  Surgeon: Rodman Pickle, MD;  [facility] OR;  Service: General;  Laterality: N/A;   I & D EXTREMITY Right 09/14/2015   Procedure: IRRIGATION AND DEBRIDEMENT RIGHT MIDDLE FINGER;  Surgeon: Dominica Severin, MD;  [facility] OR;  Service: Orthopedics;  Laterality: Right;   TUBAL LIGATION     Social History   Occupational History   Not on file  Tobacco Use   Smoking status: Never   Smokeless tobacco: Never  Vaping Use   Vaping status: Never Used  Substance and Sexual Activity   Alcohol use: Yes    Alcohol/week: 0.0 standard drinks of alcohol    Comment: occ   Drug use: No   Sexual activity: Not on file    Comment: Hysterectomy

## 2023-06-04 ENCOUNTER — Encounter: Payer: Self-pay | Admitting: Student

## 2023-06-04 ENCOUNTER — Other Ambulatory Visit (HOSPITAL_COMMUNITY): Payer: Self-pay

## 2023-06-04 ENCOUNTER — Other Ambulatory Visit

## 2023-06-04 ENCOUNTER — Ambulatory Visit: Payer: Self-pay | Admitting: Student

## 2023-06-04 VITALS — BP 120/72 | HR 93 | Wt 290.0 lb

## 2023-06-04 DIAGNOSIS — M25511 Pain in right shoulder: Secondary | ICD-10-CM

## 2023-06-04 DIAGNOSIS — M5442 Lumbago with sciatica, left side: Secondary | ICD-10-CM

## 2023-06-04 DIAGNOSIS — G8929 Other chronic pain: Secondary | ICD-10-CM | POA: Diagnosis not present

## 2023-06-04 DIAGNOSIS — Z794 Long term (current) use of insulin: Secondary | ICD-10-CM | POA: Diagnosis not present

## 2023-06-04 DIAGNOSIS — M5441 Lumbago with sciatica, right side: Secondary | ICD-10-CM | POA: Diagnosis present

## 2023-06-04 DIAGNOSIS — E119 Type 2 diabetes mellitus without complications: Secondary | ICD-10-CM | POA: Diagnosis not present

## 2023-06-04 NOTE — Assessment & Plan Note (Signed)
 A1C UTD on mounjaro 5 mg. Protein intake recommended. Continue to stay hydrated. Libre sensors given in office and CBGs from [place] all frequently in range. No changes.

## 2023-06-04 NOTE — Patient Instructions (Signed)
 It was great to see you today! Thank you for choosing [facility] for your primary care.  Today we addressed: Xray [address]  Sugars are phenomenal  PMR will call you  I placed referral for ortho for spine PT will call you   If you haven't already, sign up for My Chart to have easy access to your labs results, and communication with your primary care physician.   Please arrive 15 minutes before your appointment to ensure smooth check in process.  We appreciate your efforts in making this happen.  Thank you for allowing me to participate in your care, Alfredo Martinez, MD 06/04/2023, 1:34 PM PGY-3, [facility]

## 2023-06-04 NOTE — Telephone Encounter (Signed)
 Marland Kitchen

## 2023-06-04 NOTE — Progress Notes (Signed)
    SUBJECTIVE:   CHIEF COMPLAINT / HPI:   Back Pain:  - Had xray from chiropractor  - months of pain - b/l lumbar region - right more prevalent  - radiates down legs b/l - no weakness, numbness  - amenable to trial of PT  - no fever, chills, bladder or bowel incontinence   Chronic R Shoulder Pain:  - Has seen Dr. Roda Shutters who recommended PMR  - Maintained on meloxicam daily   DM2:  On Mounjaro and controlled   PERTINENT  PMH / PSH: Reviewed   OBJECTIVE:   BP 120/72   Pulse 93   Wt 290 lb (131.5 kg)   SpO2 96%   BMI 44.09 kg/m   General: Alert and oriented in no apparent distress Heart: Regular rate and rhythm with no murmurs appreciated Lungs: CTA bilaterally, no wheezing Abdomen: no abdominal pain Skin: Warm and dry Extremities: No lower extremity edema Back Exam:  Inspection: Unremarkable  Palpable tenderness: right sided paraspinal  Range of Motion:  Flexion 45 deg; Extension 45 deg Leg strength: Quad: 5/5 Hamstring: 5/5  Sensory change: Gross sensation intact to all lumbar and sacral dermatomes.  Gait unremarkable.     ASSESSMENT/PLAN:   Assessment & Plan Chronic right-sided low back pain with bilateral sciatica PT rx, patient requests [facility] spinal specialist eval. Referral placed. Caution with Meloxicam. Chronic back and shoulder pain, PMR order placed. No red flags, XR of lspine ordered as pain has been ongoing for months without imaging.  Type 2 diabetes mellitus without complication, with long-term current use of insulin (HCC) A1C UTD on mounjaro 5 mg. Protein intake recommended. Continue to stay hydrated. Libre sensors given in office and CBGs from [place] all frequently in range. No changes.    Mentioned diarrhea for a couple of days and suspected hot flashes. Told to monitor and return in about a week if diarrhea persists.   Alfredo Martinez, MD [facility]

## 2023-06-06 ENCOUNTER — Other Ambulatory Visit (HOSPITAL_COMMUNITY): Payer: Self-pay

## 2023-06-06 ENCOUNTER — Telehealth: Payer: Self-pay

## 2023-06-06 NOTE — Telephone Encounter (Signed)
 Pharmacy Patient Advocate Encounter   Received notification from CoverMyMeds that prior authorization for Provigil 200MG  tablets is required/requested.   Insurance verification completed.   The patient is insured through [facility] MEDICAID .   Per test claim: PA required; PA submitted to above mentioned insurance via CoverMyMeds Key/confirmation #/EOC [id number] Status is pending

## 2023-06-07 ENCOUNTER — Other Ambulatory Visit (HOSPITAL_COMMUNITY): Payer: Self-pay

## 2023-06-07 NOTE — Telephone Encounter (Signed)
 Pharmacy Patient Advocate Encounter  Received notification from [facility] MEDICAID that Prior Authorization for Provigil 200MG  tablets has been APPROVED from 06/06/2023 to 06/05/2024. Ran test claim, Copay is $4.00. This test claim was processed through [facility]- copay amounts may vary at other pharmacies due to pharmacy/plan contracts, or as the patient moves through the different stages of their insurance plan.   PA #/Case ID/Reference #: PA Case ID #: [id number]

## 2023-06-15 ENCOUNTER — Other Ambulatory Visit (HOSPITAL_COMMUNITY): Payer: Self-pay

## 2023-06-18 ENCOUNTER — Other Ambulatory Visit (HOSPITAL_COMMUNITY): Payer: Self-pay

## 2023-06-20 ENCOUNTER — Other Ambulatory Visit: Payer: Self-pay | Admitting: Student

## 2023-06-20 ENCOUNTER — Ambulatory Visit: Admitting: Physical Medicine and Rehabilitation

## 2023-06-20 ENCOUNTER — Encounter: Payer: Self-pay | Admitting: Physical Medicine and Rehabilitation

## 2023-06-20 ENCOUNTER — Other Ambulatory Visit (HOSPITAL_COMMUNITY): Payer: Self-pay

## 2023-06-20 DIAGNOSIS — M5441 Lumbago with sciatica, right side: Secondary | ICD-10-CM | POA: Diagnosis not present

## 2023-06-20 DIAGNOSIS — K5909 Other constipation: Secondary | ICD-10-CM

## 2023-06-20 DIAGNOSIS — G8929 Other chronic pain: Secondary | ICD-10-CM

## 2023-06-20 DIAGNOSIS — M5442 Lumbago with sciatica, left side: Secondary | ICD-10-CM

## 2023-06-20 DIAGNOSIS — M5416 Radiculopathy, lumbar region: Secondary | ICD-10-CM

## 2023-06-20 MED ORDER — LAMOTRIGINE 100 MG PO TABS
200.0000 mg | ORAL_TABLET | Freq: Every day | ORAL | 2 refills | Status: DC
  Filled 2023-06-20 – 2023-07-03 (×2): qty 60, 30d supply, fill #0
  Filled 2023-07-13 – 2023-08-07 (×2): qty 60, 30d supply, fill #1
  Filled 2023-09-03: qty 60, 30d supply, fill #2

## 2023-06-20 NOTE — Progress Notes (Unsigned)
 Pain Scale   Average Pain 8 Patient advising she has pain in lower back radiating bilateral legs, she also advised when she get constipated the pain increases on the right side of her back        +Driver, -BT, -Dye Allergies.

## 2023-06-20 NOTE — Progress Notes (Unsigned)
 [name] [name] - 48 y.o. female MRN [id number]  Date of birth: 1975-10-15  Office Visit Note: Visit Date: 06/20/2023 PCP: Alicia Amel, MD Referred by: Carney Living, *  Subjective: Chief Complaint  Patient presents with   Lower Back - Pain   HPI: [name] [name] is a 48 y.o. female who comes in today as a self referral for evaluation of chronic, worsening and severe bilateral lower back pain radiating down posterior legs to feet. Right sided pain seems to be most severe. States chronic issues with constipation, feels her bowel issues are causing swelling and nerve compression on the right side. Right sided issues ongoing for about 10 plus years, bilateral symptoms started about 1 year ago. Her pain worsens with activity and movement. She describes pain as grinding and sharp sensation, currently rates as 8 out of 10. Some relief of pain with home exercise regimen, rest and use of medications. History of formal chiropractic treatments with some relief of pain. CT scan of abdomen and pelvis from 2019 shows degenerative changes at L4-L5 and bilateral degenerative changes to bilateral hips. No recent imaging of lumbar spine. She is scheduled to have colonoscopy next week, will start bowel prep in the next couple days. Patient denies focal weakness, numbness and tingling. No recent trauma or falls.      Review of Systems  Musculoskeletal:  Positive for back pain.  Neurological:  Negative for tingling, sensory change, focal weakness and weakness.  All other systems reviewed and are negative.  Otherwise per HPI.  Assessment & Plan: Visit Diagnoses:    ICD-10-CM   1. Chronic bilateral low back pain with bilateral sciatica  M54.42 Ambulatory referral to Physical Therapy   M54.41 MR LUMBAR SPINE WO CONTRAST   G89.29     2. Lumbar radiculopathy  M54.16 Ambulatory referral to Physical Therapy    MR LUMBAR SPINE WO CONTRAST    3. Chronic constipation  K59.09 Ambulatory referral  to Physical Therapy    MR LUMBAR SPINE WO CONTRAST    4. Morbid (severe) obesity due to excess calories (HCC)  E66.01 Ambulatory referral to Physical Therapy    MR LUMBAR SPINE WO CONTRAST       Plan: Findings:  Chronic, worsening and severe bilateral lower back pain radiating down posterior legs, right greater than left. Also reports chronic issues with constipation and she feels is contributing to her back issues. Patients clinical presentation and exam are complex, differentials include lumbar radiculopathy and myofascial pain syndrome. Her pain pattern does fit with more S1 distribution. There is myofascial tenderness to bilateral lumbar paraspinal regions upon palpation. We discussed treatment plan in detail today. Next step is to obtain lumbar MRI imaging. Depending on results of imaging we discussed possibility of performing lumbar injections. She voiced issues with steroid injections in the past, she would prefer to avoid any type of interventional spine treatment. I also placed order for short course of formal physical therapy. I do think she would benefit from manual treatments and core strengthening. I will see her back for lumbar MRI review. I am hopeful that bowel prep will help to resolve her constipation issues. No red flag symptoms noted upon exam today.     Meds & Orders: No orders of the defined types were placed in this encounter.   Orders Placed This Encounter  Procedures   MR LUMBAR SPINE WO CONTRAST   Ambulatory referral to Physical Therapy    Follow-up: Return for Lumbar MRI review.  Procedures: No procedures performed      Clinical History: No specialty comments available.   She reports that she has never smoked. She has never been exposed to tobacco smoke. She has never used smokeless tobacco.  Recent Labs    08/28/22 1428 01/05/23 1541 04/10/23 0833 05/15/23 1356  HGBA1C 6.2 6.3 8.6*  --   LABURIC  --   --   --  3.2    Objective:  VS:  HT:    WT:    BMI:     BP:   HR: bpm  TEMP: ( )  RESP:  Physical Exam Vitals and nursing note reviewed.  HENT:     Head: Normocephalic and atraumatic.     Right Ear: External ear normal.     Left Ear: External ear normal.     Nose: Nose normal.     Mouth/Throat:     Mouth: Mucous membranes are moist.  Eyes:     Extraocular Movements: Extraocular movements intact.  Cardiovascular:     Rate and Rhythm: Normal rate.     Pulses: Normal pulses.  Pulmonary:     Effort: Pulmonary effort is normal.  Abdominal:     General: Abdomen is flat. There is no distension.  Musculoskeletal:        General: Tenderness present.     Cervical back: Normal range of motion.     Comments: Patient rises from seated position to standing without difficulty. Good lumbar range of motion. No pain noted with facet loading. 5/5 strength noted with bilateral hip flexion, knee flexion/extension, ankle dorsiflexion/plantarflexion and EHL. No clonus noted bilaterally. No pain upon palpation of greater trochanters. No pain with internal/external rotation of bilateral hips. Sensation intact bilaterally. Dysesthesias noted to S1 dermatomes. Myofascial tenderness noted to bilateral lumbar paraspinal regions upon palpation. Negative slump test bilaterally. Ambulates without aid, gait steady.     Skin:    General: Skin is warm and dry.     Capillary Refill: Capillary refill takes less than 2 seconds.  Neurological:     General: No focal deficit present.     Mental Status: She is alert and oriented to person, place, and time.  Psychiatric:        Mood and Affect: Mood normal.        Behavior: Behavior normal.     Ortho Exam  Imaging: No results found.  Past Medical/Family/Surgical/Social History: Medications & Allergies reviewed per EMR, new medications updated. Patient Active Problem List   Diagnosis Date Noted   Excessive daytime sleepiness 12/27/2022   Sleep attack 12/27/2022   Nightmare disorder 12/27/2022   Snoring  12/27/2022   Chronic right shoulder pain 11/16/2022   ADHD 01/03/2022   Urine frequency 08/08/2018   OSA on CPAP 02/09/2016   Diabetes mellitus (HCC) 07/11/2012   Pompholyx eczema 11/12/2011   Vitamin D deficiency 09/20/2007   MENOPAUSE, SURGICAL 04/24/2007   Diaphragmatic hernia 11/20/2006   Hyperlipidemia 05/03/2006   Class 3 severe obesity in adult [facility]) 05/03/2006   Bipolar I disorder (HCC) 05/03/2006   ANXIETY 05/03/2006   HYPERTENSION, BENIGN SYSTEMIC 05/03/2006   PEPTIC ULCER DIS., UNSPEC. W/O OBSTRUCTION 05/03/2006   Past Medical History:  Diagnosis Date   Allergy    Anemia    Arthritis    Bipolar 1 disorder [facility])    Sees Dr. Liborio Reeds, psychology,  [phone]   Carpal tunnel syndrome    Depression    Diabetes mellitus without complication [facility])    Family history of adverse  reaction to anesthesia    mother & son have difficulty waking   Fatigue    Ganglion of left wrist 07/26/2018   Lung nodule seen on imaging study 09/24/2013   Sleep apnea    uses cpap   Family History  Problem Relation Age of Onset   Hypertension Mother    Diabetes Mellitus II Mother    Congestive Heart Failure Father    Hypertension Father    Diabetes Mellitus II Father    Sleep apnea Father    Sleep apnea Son    Colon cancer Neg Hx    Esophageal cancer Neg Hx    Rectal cancer Neg Hx    Stomach cancer Neg Hx    Past Surgical History:  Procedure Laterality Date   ABDOMINAL HYSTERECTOMY     CHOLECYSTECTOMY N/A 06/22/2017   Procedure: LAPAROSCOPIC CHOLECYSTECTOMY;  Surgeon: Derral Flick, MD;  Location: [facility] OR;  Service: General;  Laterality: N/A;   I & D EXTREMITY Right 09/14/2015   Procedure: IRRIGATION AND DEBRIDEMENT RIGHT MIDDLE FINGER;  Surgeon: Ronn Cohn, MD;  Location: [facility] OR;  Service: Orthopedics;  Laterality: Right;   TUBAL LIGATION     Social History   Occupational History   Not on file  Tobacco Use   Smoking status: Never    Passive exposure: Never    Smokeless tobacco: Never  Vaping Use   Vaping status: Never Used  Substance and Sexual Activity   Alcohol use: Yes    Alcohol/week: 0.0 standard drinks of alcohol    Comment: occ   Drug use: No   Sexual activity: Not on file    Comment: Hysterectomy

## 2023-06-21 ENCOUNTER — Other Ambulatory Visit (HOSPITAL_COMMUNITY): Payer: Self-pay

## 2023-06-22 ENCOUNTER — Other Ambulatory Visit (HOSPITAL_COMMUNITY): Payer: Self-pay

## 2023-06-22 MED ORDER — SUFLAVE 178.7 G PO SOLR
1.0000 | ORAL | 0 refills | Status: DC
  Filled 2023-06-22: qty 2, 1d supply, fill #0

## 2023-06-24 ENCOUNTER — Ambulatory Visit
Admission: RE | Admit: 2023-06-24 | Discharge: 2023-06-24 | Discharge status: Home / Self Care | Source: Ambulatory Visit | Attending: Physical Medicine and Rehabilitation | Admitting: Physical Medicine and Rehabilitation

## 2023-06-24 DIAGNOSIS — G8929 Other chronic pain: Secondary | ICD-10-CM

## 2023-06-24 DIAGNOSIS — K5909 Other constipation: Secondary | ICD-10-CM

## 2023-06-24 DIAGNOSIS — M5416 Radiculopathy, lumbar region: Secondary | ICD-10-CM

## 2023-06-25 ENCOUNTER — Other Ambulatory Visit (HOSPITAL_COMMUNITY): Payer: Self-pay

## 2023-06-27 ENCOUNTER — Other Ambulatory Visit (HOSPITAL_COMMUNITY): Payer: Self-pay

## 2023-06-27 ENCOUNTER — Other Ambulatory Visit: Payer: Self-pay

## 2023-06-27 ENCOUNTER — Encounter: Payer: Self-pay | Admitting: Orthopaedic Surgery

## 2023-06-27 ENCOUNTER — Ambulatory Visit: Attending: Physical Medicine and Rehabilitation

## 2023-06-27 DIAGNOSIS — G8929 Other chronic pain: Secondary | ICD-10-CM | POA: Diagnosis not present

## 2023-06-27 DIAGNOSIS — M5459 Other low back pain: Secondary | ICD-10-CM | POA: Diagnosis present

## 2023-06-27 DIAGNOSIS — M6281 Muscle weakness (generalized): Secondary | ICD-10-CM | POA: Diagnosis present

## 2023-06-27 DIAGNOSIS — M5416 Radiculopathy, lumbar region: Secondary | ICD-10-CM | POA: Insufficient documentation

## 2023-06-27 DIAGNOSIS — K5909 Other constipation: Secondary | ICD-10-CM | POA: Insufficient documentation

## 2023-06-27 DIAGNOSIS — M5442 Lumbago with sciatica, left side: Secondary | ICD-10-CM | POA: Insufficient documentation

## 2023-06-27 DIAGNOSIS — M5441 Lumbago with sciatica, right side: Secondary | ICD-10-CM | POA: Insufficient documentation

## 2023-06-27 DIAGNOSIS — R2689 Other abnormalities of gait and mobility: Secondary | ICD-10-CM | POA: Diagnosis present

## 2023-06-27 NOTE — Therapy (Addendum)
 OUTPATIENT PHYSICAL THERAPY THORACOLUMBAR EVALUATION   Patient Name: [name] [name] MRN: [id number] DOB:[birth year], 48 y.o., female Today's Date: 06/27/2023  END OF SESSION:  PT End of Session - 06/27/23 1035     Visit Number 1    Number of Visits 17    Date for PT Re-Evaluation 08/22/23    Authorization Type [employer]    PT Start Time 1005    Activity Tolerance Patient limited by pain    Behavior During Therapy [facility] for tasks assessed/performed             Past Medical History:  Diagnosis Date   Allergy    Anemia    Arthritis    Bipolar 1 disorder (HCC)    Sees Dr. Liborio Reeds, psychology,  [phone]   Carpal tunnel syndrome    Depression    Diabetes mellitus without complication (HCC)    Family history of adverse reaction to anesthesia    mother & son have difficulty waking   Fatigue    Ganglion of left wrist 07/26/2018   Lung nodule seen on imaging study 09/24/2013   Sleep apnea    uses cpap   Past Surgical History:  Procedure Laterality Date   ABDOMINAL HYSTERECTOMY     CHOLECYSTECTOMY N/A 06/22/2017   Procedure: LAPAROSCOPIC CHOLECYSTECTOMY;  Surgeon: Derral Flick, MD;  Location: [facility] OR;  Service: General;  Laterality: N/A;   I & D EXTREMITY Right 09/14/2015   Procedure: IRRIGATION AND DEBRIDEMENT RIGHT MIDDLE FINGER;  Surgeon: Ronn Cohn, MD;  Location: [facility] OR;  Service: Orthopedics;  Laterality: Right;   TUBAL LIGATION     Patient Active Problem List   Diagnosis Date Noted   Excessive daytime sleepiness 12/27/2022   Sleep attack 12/27/2022   Nightmare disorder 12/27/2022   Snoring 12/27/2022   Chronic right shoulder pain 11/16/2022   ADHD 01/03/2022   Urine frequency 08/08/2018   OSA on CPAP 02/09/2016   Diabetes mellitus (HCC) 07/11/2012   Pompholyx eczema 11/12/2011   Vitamin D  deficiency 09/20/2007   MENOPAUSE, SURGICAL 04/24/2007   Diaphragmatic hernia 11/20/2006   Hyperlipidemia 05/03/2006   Class 3 severe obesity in adult [facility])  05/03/2006   Bipolar I disorder (HCC) 05/03/2006   ANXIETY 05/03/2006   HYPERTENSION, BENIGN SYSTEMIC 05/03/2006   PEPTIC ULCER DIS., UNSPEC. W/O OBSTRUCTION 05/03/2006    PCP: Limmie Ren, MD  REFERRING PROVIDER: Darryll Eng, NP  REFERRING DIAG:  M54.42,M54.41,G89.29 (ICD-10-CM) - Chronic bilateral low back pain with bilateral sciatica M54.16 (ICD-10-CM) - Lumbar radiculopathy K59.09 (ICD-10-CM) - Chronic constipation E66.01 (ICD-10-CM) - Morbid (severe) obesity due to excess calories (HCC)  Rationale for Evaluation and Treatment: Rehabilitation  THERAPY DIAG:  Other low back pain  Muscle weakness (generalized)  Other abnormalities of gait and mobility  ONSET DATE: Chronic  SUBJECTIVE:  SUBJECTIVE STATEMENT: Pt presents to PT with reports of chronic LBP that refers into bilateral posterior thighs with N/T down to feet. Notes that pain is worse when she is constipated and this happens fairly frequently when she misses her metformin . Has a colonoscopy on 4/26 to assess possible GI issues. Denies bowel/bladder changes or saddle anesthesia. No trauma or MOI, pain has been getting worse over time. She is currently schedule to have shoulder surgery but wants to hold off on this if possible. Feels like her standing activity tolerance is limited to 10 minutes at most right now. Had to stop working due to back and shoulder pain.  PERTINENT HISTORY:  DM, Bipolar disorder  PAIN:  Are you having pain?  Yes: NPRS scale: 8/10 Worst: 10/10 Pain location: lower back, bilateral LE Pain description: sharp, sore, N/T Aggravating factors: constipation, movement Relieving factors: none  PRECAUTIONS: None  RED FLAGS: None   WEIGHT BEARING RESTRICTIONS: No  FALLS:  Has patient fallen in last 6  months? No  LIVING ENVIRONMENT: Lives with: lives with their family Lives in: House/apartment Stairs: Yes: External: 4 steps; can reach both Has following equipment at home: None  OCCUPATION: Not currently working  PLOF: Independent  PATIENT GOALS: decrease back pain, improve standing tolerance (10 minutes at most right now)  NEXT MD VISIT: Nothing currently scheduling - awaiting MRI results   OBJECTIVE:  Note: Objective measures were completed at Evaluation unless otherwise noted.  DIAGNOSTIC FINDINGS:  Awaiting MRI results  PATIENT SURVEYS:  ODI: 20/50 - 40% disability  COGNITION: Overall cognitive status: Within functional limits for tasks assessed     SENSATION: Light touch: Impaired - posterior thighs   MUSCLE LENGTH: Thomas test: Right (+); Left (+)  POSTURE: rounded shoulders, forward head, increased lumbar lordosis, and large body habitus  PALPATION: TTP to bilateral lumbar paraspinals  LUMBAR ROM:   AROM eval  Flexion 50% pain  Extension 75% pain  Right lateral flexion   Left lateral flexion   Right rotation   Left rotation    (Blank rows = not tested)  LOWER EXTREMITY MMT:    MMT Right eval Left eval  Hip flexion 4 4  Hip extension    Hip abduction 4 4  Hip adduction    Hip internal rotation    Hip external rotation    Knee flexion    Knee extension    Ankle dorsiflexion    Ankle plantarflexion    Ankle inversion    Ankle eversion     (Blank rows = not tested)  LUMBAR SPECIAL TESTS:  Straight leg raise test: Positive and Slump test: Positive  FUNCTIONAL TESTS:  30 Second Sit to Stand: 6 reps with UE  GAIT: Distance walked: 77ft Assistive device utilized: None Level of assistance: SBA Comments: trunk flexed, decreased gait speed  TREATMENT: [facility] Adult PT Treatment:                                                DATE: 06/27/2023 Therapeutic Exercise: Modified thomas x 60" R Supine PPT x 5 Bridge x 5 LTR x 5 Seated sciatic  nerve glide x 5 each  PATIENT EDUCATION:  Education details: eval findings, ODI, HEP, POC Person educated: Patient Education method: Explanation, Demonstration, and Handouts Education comprehension: verbalized understanding and returned demonstration  HOME EXERCISE PROGRAM: Access Code: [email or web address] URL: [email or web address] Date:  06/27/2023 Prepared by: Loral Roch  Exercises - Modified Thomas Stretch  - 1 x daily - 7 x weekly - 2 reps - 60 sec hold - Supine Posterior Pelvic Tilt  - 1 x daily - 7 x weekly - 2 sets - 10 reps - 5 sec hold - Supine Bridge  - 1 x daily - 7 x weekly - 2 sets - 10 reps - Supine Lower Trunk Rotation  - 1 x daily - 7 x weekly - 2 sets - 10 reps - Seated Sciatic Tensioner  - 1 x daily - 7 x weekly - 2 sets - 15 reps  ASSESSMENT:  CLINICAL IMPRESSION: Patient is a 48 y.o. F who was seen today for physical therapy evaluation and treatment for chronic LBP that refers into bilateral posterior LE. Physical findings are consistent with referring provider impression as pt demonstrates decrease in hip/core strength and general functional mobility. ODI score shows severe disability in performance of home ADLs and community activities. Pt would benefit from skilled PT services working on improving strength and activity tolerance in order to decrease back pain and improve function and standing time.    OBJECTIVE IMPAIRMENTS: Abnormal gait, decreased activity tolerance, decreased balance, decreased endurance, decreased mobility, difficulty walking, decreased ROM, decreased strength, postural dysfunction, and pain  ACTIVITY LIMITATIONS: carrying, lifting, bending, sitting, standing, squatting, stairs, and transfers  PARTICIPATION LIMITATIONS: meal prep, cleaning, driving, shopping, community activity, occupation, and yard work  PERSONAL FACTORS: Time since onset of injury/illness/exacerbation and 1-2 comorbidities: DM, Bipolar disorder are also affecting  patient's functional outcome.   REHAB POTENTIAL: Good  CLINICAL DECISION MAKING: Evolving/moderate complexity  EVALUATION COMPLEXITY: Moderate   GOALS: Goals reviewed with patient? No  SHORT TERM GOALS: Target date: 07/18/2023   Pt will be compliant and knowledgeable with initial HEP for improved comfort and carryover Baseline: initial HEP given  Goal status: INITIAL  2.  Pt will self report low back and LE pain no greater than 7/10 for improved comfort and functional ability Baseline: 10/10 at worst Goal status: INITIAL   LONG TERM GOALS: Target date: 08/22/2023   Pt will be decrease ODI disability score to no greater than 25% (12/50) as proxy for functional improvement Baseline: 40% disability (20/50) Goal status: INITIAL  2.  Pt will self report low back and LE pain no greater than 3/10 for improved comfort and functional ability Baseline: 10/10 at worst Goal status: INITIAL   3.  Pt will increase 30 Second Sit to Stand rep count to no less than 10 reps for improved balance, strength, and functional mobility Baseline: 6 reps with UE Goal status: INITIAL   4.  Pt will improve standing activity tolerance to no less than 30 minutes before limitation secondary to LBP in order to improve functional activity tolerance and ADL performance Baseline: 10 minutes Goal status: INITIAL  5.  Pt will improve all LE MMT to no less than 4+/5 for improved comfort and functional mobility Baseline: see MMT chart Goal status: INITIAL  PLAN:  PT FREQUENCY: 3x/week  PT DURATION: 8 weeks  PLANNED INTERVENTIONS: 97164- PT Re-evaluation, 97110-Therapeutic exercises, 97530- Therapeutic activity, [id number]- Neuromuscular re-education, 97535- Self Care, [id number]- Manual therapy, [id number]- Gait training, [phone]- Aquatic Therapy, [phone]- Electrical stimulation (unattended), [phone]- Electrical stimulation (manual), Dry Needling, Cryotherapy, and Moist heat  PLAN FOR NEXT SESSION: assess HEP response, neutral  spine core strengthening, proximal hip strengthening    Ivor Mars, PT 06/27/2023, 10:43 AM

## 2023-06-28 ENCOUNTER — Telehealth: Payer: Self-pay

## 2023-06-28 NOTE — Telephone Encounter (Signed)
 Please order PT for both shoulders.  Thanks.

## 2023-06-28 NOTE — Telephone Encounter (Signed)
 Rec'd 2nd PA request for patients Mounjaro .   Previously denied (04/13/23 telephone note): Patient must have tried and failed metformin  at the maximally tolerated dose for a minimum of 3 months.   Patient was using samples.

## 2023-06-29 ENCOUNTER — Other Ambulatory Visit: Payer: Self-pay

## 2023-06-29 ENCOUNTER — Telehealth: Payer: Self-pay | Admitting: Gastroenterology

## 2023-06-29 ENCOUNTER — Encounter: Payer: No Typology Code available for payment source | Admitting: Gastroenterology

## 2023-06-29 ENCOUNTER — Telehealth: Payer: Self-pay

## 2023-06-29 DIAGNOSIS — G8929 Other chronic pain: Secondary | ICD-10-CM

## 2023-06-29 NOTE — Telephone Encounter (Signed)
 Scheduling called to state that patient called at 0800 to state that she could not find her prep and did not start it until 0730 this morning. She is still in the process of trying to finish the prep and is not cleaned out.  Admitting RN told scheduler that patient will need to reschedule for another time. Scheduler stated that she will reschedule patient.

## 2023-06-29 NOTE — Telephone Encounter (Addendum)
 Noted.

## 2023-06-29 NOTE — Telephone Encounter (Signed)
 Patient retuning call. Patient has already been reschedule for procedure and pre visit. States she is not needing anything further. Please f/u if needing to further advise.   Thank you

## 2023-06-29 NOTE — Telephone Encounter (Signed)
 Patient rescheduled for today procedure due to not starting her prep medication until 7 am this morning.

## 2023-06-29 NOTE — Telephone Encounter (Signed)
 Left message for pt to call back

## 2023-07-03 ENCOUNTER — Encounter: Payer: Self-pay | Admitting: Physical Medicine and Rehabilitation

## 2023-07-03 ENCOUNTER — Other Ambulatory Visit (HOSPITAL_COMMUNITY): Payer: Self-pay

## 2023-07-03 ENCOUNTER — Ambulatory Visit: Admitting: Physical Medicine and Rehabilitation

## 2023-07-03 DIAGNOSIS — M5416 Radiculopathy, lumbar region: Secondary | ICD-10-CM

## 2023-07-03 DIAGNOSIS — G8929 Other chronic pain: Secondary | ICD-10-CM | POA: Diagnosis not present

## 2023-07-03 DIAGNOSIS — M5441 Lumbago with sciatica, right side: Secondary | ICD-10-CM | POA: Diagnosis not present

## 2023-07-03 DIAGNOSIS — M5442 Lumbago with sciatica, left side: Secondary | ICD-10-CM | POA: Diagnosis not present

## 2023-07-03 DIAGNOSIS — M47816 Spondylosis without myelopathy or radiculopathy, lumbar region: Secondary | ICD-10-CM

## 2023-07-03 NOTE — Progress Notes (Unsigned)
 [name] [name] - 48 y.o. female MRN [id number]  Date of birth: [birth year]  Office Visit Note: Visit Date: 07/03/2023 PCP: Limmie Ren, MD Referred by: Limmie Ren, MD  Subjective: No chief complaint on file.  HPI: [name] [name] is a 48 y.o. female who comes in today for evaluation of chronic, worsening and severe bilateral lower back pain radiating down posterior legs to feet. Right sided pain seems to be most severe. Pain ongoing for several years, worsens with activity and movement. She describes pain as grinding and sharp, currently rates as 8 out of 10. Some relief of pain with home exercise regimen, rest and use of medications. She is scheduled to start physical therapy next week. Recent lumbar MRI imaging shows multi level degenerative changes, multilevel disc desiccation and moderate facet arthrosis is present most notably at L3-L4 and L4-L5. No high grade spinal canal stenosis noted. Patient denies focal weakness, numbness and tingling. No recent trauma or falls.   Patient does have history of bipolar disorder, anxiety, ADHD, diabetes mellitus and morbid obesity. There are several allergies/intolerances listed to medications. She also voiced great concern about steroid injections. She reports intolerance to steroid injections in the past. States these steroid injections caused her pain to become excruciating.      Review of Systems  Musculoskeletal:  Positive for back pain.  Neurological:  Negative for tingling, sensory change, focal weakness and weakness.  All other systems reviewed and are negative.  Otherwise per HPI.  Assessment & Plan: Visit Diagnoses: No diagnosis found.   Plan: Findings:  Chronic, worsening and severe bilateral lower back pain radiating down posterior legs to feet. Right sided pain seems to be most severe. Patient continues to have severe pain despite good conservative therapies such as home exercise regimen, rest and use of medications. I  discussed recent lumbar MRI with patient today using imaging and spine model. Patients clinical presentation and exam are consistent with lumbar radiculopathy. There are multi level degenerative changes, most prominent at L3-L4 and L4-L5. There is also mild to moderate bilateral foraminal narrowing at L3-L4. We discussed treatment plan in detail. I offered diagnostic and hopefully therapeutic right L4-L5 interlaminar epidural steroid injection, however patient would like to hold at this time. There is anxiety surrounding steroid injection due to prior issues with increased pain. I am not sure she would be good candidate for lumbar injections given her prior issues with steroid injections. She would like to proceed with physical therapy at this time. I do think she would benefit from core strengthening. We are happy to see her back as needed. No red flag symptoms noted upon exam today.     Meds & Orders: No orders of the defined types were placed in this encounter.  No orders of the defined types were placed in this encounter.   Follow-up: No follow-ups on file.   Procedures: No procedures performed      Clinical History: No specialty comments available.   She reports that she has never smoked. She has never been exposed to tobacco smoke. She has never used smokeless tobacco.  Recent Labs    08/28/22 1428 01/05/23 1541 04/10/23 0833 05/15/23 1356  HGBA1C 6.2 6.3 8.6*  --   LABURIC  --   --   --  3.2    Objective:  VS:  HT:    WT:   BMI:     BP:   HR: bpm  TEMP: ( )  RESP:  Physical Exam  Vitals and nursing note reviewed.  HENT:     Head: Normocephalic and atraumatic.     Right Ear: External ear normal.     Left Ear: External ear normal.     Nose: Nose normal.     Mouth/Throat:     Mouth: Mucous membranes are moist.  Eyes:     Extraocular Movements: Extraocular movements intact.  Cardiovascular:     Rate and Rhythm: Normal rate.     Pulses: Normal pulses.  Pulmonary:      Effort: Pulmonary effort is normal.  Abdominal:     General: Abdomen is flat. There is no distension.  Musculoskeletal:        General: Tenderness present.     Cervical back: Normal range of motion.     Comments: Patient rises from seated position to standing without difficulty. Good lumbar range of motion. No pain noted with facet loading. 5/5 strength noted with bilateral hip flexion, knee flexion/extension, ankle dorsiflexion/plantarflexion and EHL. No clonus noted bilaterally. No pain upon palpation of greater trochanters. No pain with internal/external rotation of bilateral hips. Sensation intact bilaterally. Negative slump test bilaterally. Ambulates without aid, gait steady.     Skin:    General: Skin is warm.     Capillary Refill: Capillary refill takes less than 2 seconds.  Neurological:     General: No focal deficit present.     Mental Status: She is alert and oriented to person, place, and time.  Psychiatric:        Mood and Affect: Mood normal.        Behavior: Behavior normal.     Ortho Exam  Imaging: No results found.  Past Medical/Family/Surgical/Social History: Medications & Allergies reviewed per EMR, new medications updated. Patient Active Problem List   Diagnosis Date Noted   Excessive daytime sleepiness 12/27/2022   Sleep attack 12/27/2022   Nightmare disorder 12/27/2022   Snoring 12/27/2022   Chronic right shoulder pain 11/16/2022   ADHD 01/03/2022   Urine frequency 08/08/2018   OSA on CPAP 02/09/2016   Diabetes mellitus (HCC) 07/11/2012   Pompholyx eczema 11/12/2011   Vitamin D  deficiency 09/20/2007   MENOPAUSE, SURGICAL 04/24/2007   Diaphragmatic hernia 11/20/2006   Hyperlipidemia 05/03/2006   Class 3 severe obesity in adult [facility]) 05/03/2006   Bipolar I disorder (HCC) 05/03/2006   ANXIETY 05/03/2006   HYPERTENSION, BENIGN SYSTEMIC 05/03/2006   PEPTIC ULCER DIS., UNSPEC. W/O OBSTRUCTION 05/03/2006   Past Medical History:  Diagnosis Date    Allergy    Anemia    Arthritis    Bipolar 1 disorder [facility])    Sees Dr. Liborio Reeds, psychology,  [phone]   Carpal tunnel syndrome    Depression    Diabetes mellitus without complication [facility])    Family history of adverse reaction to anesthesia    mother & son have difficulty waking   Fatigue    Ganglion of left wrist 07/26/2018   Lung nodule seen on imaging study 09/24/2013   Sleep apnea    uses cpap   Family History  Problem Relation Age of Onset   Hypertension Mother    Diabetes Mellitus II Mother    Congestive Heart Failure Father    Hypertension Father    Diabetes Mellitus II Father    Sleep apnea Father    Sleep apnea Son    Colon cancer Neg Hx    Esophageal cancer Neg Hx    Rectal cancer Neg Hx    Stomach cancer Neg Hx  Past Surgical History:  Procedure Laterality Date   ABDOMINAL HYSTERECTOMY     CHOLECYSTECTOMY N/A 06/22/2017   Procedure: LAPAROSCOPIC CHOLECYSTECTOMY;  Surgeon: Kinsinger, Alphonso Aschoff, MD;  Location: [facility] OR;  Service: General;  Laterality: N/A;   I & D EXTREMITY Right 09/14/2015   Procedure: IRRIGATION AND DEBRIDEMENT RIGHT MIDDLE FINGER;  Surgeon: Ronn Cohn, MD;  Location: [facility] OR;  Service: Orthopedics;  Laterality: Right;   TUBAL LIGATION     Social History   Occupational History   Not on file  Tobacco Use   Smoking status: Never    Passive exposure: Never   Smokeless tobacco: Never  Vaping Use   Vaping status: Never Used  Substance and Sexual Activity   Alcohol use: Yes    Alcohol/week: 0.0 standard drinks of alcohol    Comment: occ   Drug use: No   Sexual activity: Not on file    Comment: Hysterectomy

## 2023-07-03 NOTE — Progress Notes (Unsigned)
 Core Outcome Measures Index (COMI) Back Score  Average Pain 7  COMI Score 60 %

## 2023-07-03 NOTE — Progress Notes (Unsigned)
 Pain Scale   Average Pain 9 Patient advising she is starting PT and is excited to continue the PT        +Driver, -BT, -Dye Allergies.

## 2023-07-04 ENCOUNTER — Encounter: Payer: Self-pay | Admitting: Student

## 2023-07-10 ENCOUNTER — Encounter: Admitting: Physical Therapy

## 2023-07-13 ENCOUNTER — Encounter: Payer: Self-pay | Admitting: Physical Therapy

## 2023-07-13 ENCOUNTER — Other Ambulatory Visit (HOSPITAL_COMMUNITY): Payer: Self-pay

## 2023-07-13 ENCOUNTER — Other Ambulatory Visit: Payer: Self-pay

## 2023-07-13 ENCOUNTER — Ambulatory Visit: Attending: Physical Medicine and Rehabilitation | Admitting: Physical Therapy

## 2023-07-13 DIAGNOSIS — M6281 Muscle weakness (generalized): Secondary | ICD-10-CM | POA: Diagnosis present

## 2023-07-13 DIAGNOSIS — R2689 Other abnormalities of gait and mobility: Secondary | ICD-10-CM | POA: Diagnosis present

## 2023-07-13 DIAGNOSIS — M5459 Other low back pain: Secondary | ICD-10-CM | POA: Insufficient documentation

## 2023-07-13 NOTE — Therapy (Signed)
 OUTPATIENT PHYSICAL THERAPY THORACOLUMBAR TREATMENT   Patient Name: [name] [name] MRN: [id number] DOB:[birth year], 48 y.o., female Today's Date: 07/13/2023  END OF SESSION:  PT End of Session - 07/13/23 0932     Visit Number 2    Number of Visits 17    Date for PT Re-Evaluation 08/22/23    Authorization Type [employer]    PT Start Time 0933    PT Stop Time 1015    PT Time Calculation (min) 42 min             Past Medical History:  Diagnosis Date   Allergy    Anemia    Arthritis    Bipolar 1 disorder (HCC)    Sees Dr. Liborio Reeds, psychology,  [phone]   Carpal tunnel syndrome    Depression    Diabetes mellitus without complication (HCC)    Family history of adverse reaction to anesthesia    mother & son have difficulty waking   Fatigue    Ganglion of left wrist 07/26/2018   Lung nodule seen on imaging study 09/24/2013   Sleep apnea    uses cpap   Past Surgical History:  Procedure Laterality Date   ABDOMINAL HYSTERECTOMY     CHOLECYSTECTOMY N/A 06/22/2017   Procedure: LAPAROSCOPIC CHOLECYSTECTOMY;  Surgeon: Derral Flick, MD;  Location: [facility] OR;  Service: General;  Laterality: N/A;   I & D EXTREMITY Right 09/14/2015   Procedure: IRRIGATION AND DEBRIDEMENT RIGHT MIDDLE FINGER;  Surgeon: Ronn Cohn, MD;  Location: [facility] OR;  Service: Orthopedics;  Laterality: Right;   TUBAL LIGATION     Patient Active Problem List   Diagnosis Date Noted   Excessive daytime sleepiness 12/27/2022   Sleep attack 12/27/2022   Nightmare disorder 12/27/2022   Snoring 12/27/2022   Chronic right shoulder pain 11/16/2022   ADHD 01/03/2022   Urine frequency 08/08/2018   OSA on CPAP 02/09/2016   Diabetes mellitus (HCC) 07/11/2012   Pompholyx eczema 11/12/2011   Vitamin D  deficiency 09/20/2007   MENOPAUSE, SURGICAL 04/24/2007   Diaphragmatic hernia 11/20/2006   Hyperlipidemia 05/03/2006   Class 3 severe obesity in adult 05/03/2006   Bipolar I disorder (HCC) 05/03/2006    ANXIETY 05/03/2006   HYPERTENSION, BENIGN SYSTEMIC 05/03/2006   PEPTIC ULCER DIS., UNSPEC. W/O OBSTRUCTION 05/03/2006    PCP: Limmie Ren, MD  REFERRING PROVIDER: Darryll Eng, NP  REFERRING DIAG:  M54.42,M54.41,G89.29 (ICD-10-CM) - Chronic bilateral low back pain with bilateral sciatica M54.16 (ICD-10-CM) - Lumbar radiculopathy K59.09 (ICD-10-CM) - Chronic constipation E66.01 (ICD-10-CM) - Morbid (severe) obesity due to excess calories (HCC)  Rationale for Evaluation and Treatment: Rehabilitation  THERAPY DIAG:  Other low back pain  Muscle weakness (generalized)  ONSET DATE: Chronic  SUBJECTIVE:  SUBJECTIVE STATEMENT: I have not completed the HEP but I know I need to. I would like to come 3 x per week until my shoulder surgery.    EVAL: Pt presents to PT with reports of chronic LBP that refers into bilateral posterior thighs with N/T down to feet. Notes that pain is worse when she is constipated and this happens fairly frequently when she misses her metformin . Has a colonoscopy on 4/26 to assess possible GI issues. Denies bowel/bladder changes or saddle anesthesia. No trauma or MOI, pain has been getting worse over time. She is currently schedule to have shoulder surgery but wants to hold off on this if possible. Feels like her standing activity tolerance is limited to 10 minutes at most right now. Had to stop working due to back and shoulder pain.  PERTINENT HISTORY:  DM, Bipolar disorder  PAIN:  Are you having pain?  Yes: NPRS scale: 9/10 Worst: 10/10 Pain location: lower back, bilateral LE Pain description: sharp, sore, N/T Aggravating factors: constipation, movement Relieving factors: none  PRECAUTIONS: None  RED FLAGS: None   WEIGHT BEARING RESTRICTIONS: No  FALLS:   Has patient fallen in last 6 months? No  LIVING ENVIRONMENT: Lives with: lives with their family Lives in: House/apartment Stairs: Yes: External: 4 steps; can reach both Has following equipment at home: None  OCCUPATION: Not currently working  PLOF: Independent  PATIENT GOALS: decrease back pain, improve standing tolerance (10 minutes at most right now)  NEXT MD VISIT: Nothing currently scheduling - awaiting MRI results   OBJECTIVE:  Note: Objective measures were completed at Evaluation unless otherwise noted.  DIAGNOSTIC FINDINGS:  Awaiting MRI results  PATIENT SURVEYS:  ODI: 20/50 - 40% disability  COGNITION: Overall cognitive status: Within functional limits for tasks assessed     SENSATION: Light touch: Impaired - posterior thighs   MUSCLE LENGTH: Thomas test: Right (+); Left (+)  POSTURE: rounded shoulders, forward head, increased lumbar lordosis, and large body habitus  PALPATION: TTP to bilateral lumbar paraspinals  LUMBAR ROM:   AROM eval  Flexion 50% pain  Extension 75% pain  Right lateral flexion   Left lateral flexion   Right rotation   Left rotation    (Blank rows = not tested)  LOWER EXTREMITY MMT:    MMT Right eval Left eval  Hip flexion 4 4  Hip extension    Hip abduction 4 4  Hip adduction    Hip internal rotation    Hip external rotation    Knee flexion    Knee extension    Ankle dorsiflexion    Ankle plantarflexion    Ankle inversion    Ankle eversion     (Blank rows = not tested)  LUMBAR SPECIAL TESTS:  Straight leg raise test: Positive and Slump test: Positive  FUNCTIONAL TESTS:  30 Second Sit to Stand: 6 reps with UE  GAIT: Distance walked: 29ft Assistive device utilized: None Level of assistance: SBA Comments: trunk flexed, decreased gait speed  TREATMENT: [facility] Adult PT Treatment:                                                DATE: 07/13/23 Therapeutic Exercise: Supine PPT PPT to bridge LTR Supine march   Seated PPT  Seated march Seated clam blue band  Seated sciatic nerve glide  Standing hip flexor stretch  Seated  lumbar flexion with and with exercise ball Updated HEP  Therapeutic Activity: Nustep L4 Le only x 5 minutes  STS x 5 -c/o knee pain     [facility] Adult PT Treatment:                                                DATE: 06/27/2023 Therapeutic Exercise: Modified thomas x 60" R Supine PPT x 5 Bridge x 5 LTR x 5 Seated sciatic nerve glide x 5 each  PATIENT EDUCATION:  Education details: eval findings, ODI, HEP, POC Person educated: Patient Education method: Explanation, Demonstration, and Handouts Education comprehension: verbalized understanding and returned demonstration  HOME EXERCISE PROGRAM: Access Code: [email or web address] URL: [email or web address] Date: 06/27/2023 Prepared by: Loral Roch  Exercises - Modified Thomas Stretch  - 1 x daily - 7 x weekly - 2 reps - 60 sec hold - Supine Posterior Pelvic Tilt  - 1 x daily - 7 x weekly - 2 sets - 10 reps - 5 sec hold - Supine Bridge  - 1 x daily - 7 x weekly - 2 sets - 10 reps - Supine Lower Trunk Rotation  - 1 x daily - 7 x weekly - 2 sets - 10 reps - Seated Sciatic Tensioner  - 1 x daily - 7 x weekly - 2 sets - 15 reps 07/13/23 - Seated Pelvic Tilt  - 1 x daily - 7 x weekly - 2 sets - 10 reps - Seated March  - 1 x daily - 7 x weekly - 2 sets - 10 reps - seated clam with resistance band  - 1 x daily - 7 x weekly - 2 sets - 10 reps - Sit to stand with control  - 1 x daily - 7 x weekly - 2 sets - 10 reps - Seated Flexion Stretch  - 1 x daily - 7 x weekly - 1 sets - 10 reps - 5-10 hold  ASSESSMENT:  CLINICAL IMPRESSION: 9/10 pain on arrival and did not take any ibuprofen  yet. Leg pain present but was worse yesterday. Pt has not completed HEP. Will have shoulder surgery soon and would like to increase frequency of visits for this episode of care. Pt reports supine exercises are less likely for her to complete.  Developed seated HEP for lumbar stability and LE strengthening. Reviewed options for hip flexor stretches and added seated lumbar flexion stretch.    EVAL: Patient is a 48 y.o. F who was seen today for physical therapy evaluation and treatment for chronic LBP that refers into bilateral posterior LE. Physical findings are consistent with referring provider impression as pt demonstrates decrease in hip/core strength and general functional mobility. ODI score shows severe disability in performance of home ADLs and community activities. Pt would benefit from skilled PT services working on improving strength and activity tolerance in order to decrease back pain and improve function and standing time.    OBJECTIVE IMPAIRMENTS: Abnormal gait, decreased activity tolerance, decreased balance, decreased endurance, decreased mobility, difficulty walking, decreased ROM, decreased strength, postural dysfunction, and pain  ACTIVITY LIMITATIONS: carrying, lifting, bending, sitting, standing, squatting, stairs, and transfers  PARTICIPATION LIMITATIONS: meal prep, cleaning, driving, shopping, community activity, occupation, and yard work  PERSONAL FACTORS: Time since onset of injury/illness/exacerbation and 1-2 comorbidities: DM, Bipolar disorder are also affecting patient's functional outcome.   REHAB POTENTIAL: Good  CLINICAL DECISION  MAKING: Evolving/moderate complexity  EVALUATION COMPLEXITY: Moderate   GOALS: Goals reviewed with patient? No  SHORT TERM GOALS: Target date: 07/18/2023   Pt will be compliant and knowledgeable with initial HEP for improved comfort and carryover Baseline: initial HEP given  Goal status: INITIAL  2.  Pt will self report low back and LE pain no greater than 7/10 for improved comfort and functional ability Baseline: 10/10 at worst Goal status: INITIAL   LONG TERM GOALS: Target date: 08/22/2023   Pt will be decrease ODI disability score to no greater than 25% (12/50) as  proxy for functional improvement Baseline: 40% disability (20/50) Goal status: INITIAL  2.  Pt will self report low back and LE pain no greater than 3/10 for improved comfort and functional ability Baseline: 10/10 at worst Goal status: INITIAL   3.  Pt will increase 30 Second Sit to Stand rep count to no less than 10 reps for improved balance, strength, and functional mobility Baseline: 6 reps with UE Goal status: INITIAL   4.  Pt will improve standing activity tolerance to no less than 30 minutes before limitation secondary to LBP in order to improve functional activity tolerance and ADL performance Baseline: 10 minutes Goal status: INITIAL  5.  Pt will improve all LE MMT to no less than 4+/5 for improved comfort and functional mobility Baseline: see MMT chart Goal status: INITIAL  PLAN:  PT FREQUENCY: 1-2x/week  PT DURATION: 8 weeks  PLANNED INTERVENTIONS: 97164- PT Re-evaluation, 97110-Therapeutic exercises, 97530- Therapeutic activity, 97112- Neuromuscular re-education, 97535- Self Care, 18841- Manual therapy, [id number]- Gait training, [phone]- Aquatic Therapy, [phone]- Electrical stimulation (unattended), [phone]- Electrical stimulation (manual), Dry Needling, Cryotherapy, and Moist heat  PLAN FOR NEXT SESSION: assess HEP response, neutral spine core strengthening, proximal hip strengthening    Gasper Karst, PTA 07/13/23 10:31 AM Phone: [phone] Fax: [phone]

## 2023-07-16 ENCOUNTER — Other Ambulatory Visit (HOSPITAL_COMMUNITY): Payer: Self-pay

## 2023-07-17 ENCOUNTER — Ambulatory Visit: Admitting: Physical Therapy

## 2023-07-17 ENCOUNTER — Telehealth: Payer: Self-pay

## 2023-07-17 ENCOUNTER — Encounter (HOSPITAL_COMMUNITY): Payer: Self-pay

## 2023-07-17 ENCOUNTER — Ambulatory Visit: Admitting: Student

## 2023-07-17 ENCOUNTER — Encounter: Payer: Self-pay | Admitting: Student

## 2023-07-17 ENCOUNTER — Other Ambulatory Visit (HOSPITAL_COMMUNITY): Payer: Self-pay

## 2023-07-17 VITALS — BP 122/78 | HR 85 | Ht 68.0 in | Wt 301.2 lb

## 2023-07-17 DIAGNOSIS — Z794 Long term (current) use of insulin: Secondary | ICD-10-CM | POA: Diagnosis not present

## 2023-07-17 DIAGNOSIS — E11628 Type 2 diabetes mellitus with other skin complications: Secondary | ICD-10-CM

## 2023-07-17 DIAGNOSIS — G4733 Obstructive sleep apnea (adult) (pediatric): Secondary | ICD-10-CM | POA: Diagnosis not present

## 2023-07-17 DIAGNOSIS — K5900 Constipation, unspecified: Secondary | ICD-10-CM | POA: Diagnosis not present

## 2023-07-17 DIAGNOSIS — E66813 Obesity, class 3: Secondary | ICD-10-CM

## 2023-07-17 LAB — POCT GLYCOSYLATED HEMOGLOBIN (HGB A1C): HbA1c, POC (controlled diabetic range): 7.2 % — AB (ref 0.0–7.0)

## 2023-07-17 MED ORDER — MOUNJARO 10 MG/0.5ML ~~LOC~~ SOAJ
10.0000 mg | SUBCUTANEOUS | 2 refills | Status: DC
  Filled 2023-07-17: qty 2, 28d supply, fill #0

## 2023-07-17 MED ORDER — POLYETHYLENE GLYCOL 3350 17 GM/SCOOP PO POWD
17.0000 g | Freq: Every day | ORAL | 0 refills | Status: DC
Start: 2023-07-17 — End: 2023-08-27
  Filled 2023-07-17: qty 510, 30d supply, fill #0

## 2023-07-17 NOTE — Telephone Encounter (Signed)
 Pharmacy Patient Advocate Encounter  Received notification from [facility] MEDICAID that Prior Authorization for MOUNJARO  has been DENIED.  Full denial letter will be uploaded to the media tab. See denial reason below.  If the request is for a non-preferred drug, you have tried one preferred drugs (or your doctor provides clinical reason why you cannot use the drugs): Byetta Pen, Ozempic  Pen, and brand Victoza  Pen.  Patient has only had previous failure of Trulicity . Will need to try another preferred drug before Mounjaro  is possibly covered.   PA #/Case ID/Reference #: [id number]

## 2023-07-17 NOTE — Assessment & Plan Note (Signed)
 Unfortunately weight is up 11lbs. However, has been out of Mounjaro  x3 weeks (2 missed doses) due to insurance issues.  - Increase Mounjaro  to 10mg  weekly - Continues to eat a vegan diet

## 2023-07-17 NOTE — Telephone Encounter (Signed)
 3rd attempt  Pharmacy Patient Advocate Encounter   Received notification from CoverMyMeds that prior authorization for MOUNJARO  10MG  is required/requested.   Insurance verification completed.   The patient is insured through [facility] MEDICAID .   PA required; PA submitted to above mentioned insurance via CoverMyMeds Key/confirmation #/EOC [employer]. Status is pending

## 2023-07-17 NOTE — Patient Instructions (Addendum)
 Congrats on the A1c improvement. We still have room to go on the weight loss. Let's increase Mounjaro  to 10mg /week.    MEDICATIONS Miralax  1 capful 1x a day mixed into 8 ounces of water       Record stools daily. Goal is 2 soft stools per day.  If having less than 2 stools or they are hard: increase Miralax  to 1.5 capful 1x a day If having more than 2 stools or they are very runny: decrease Miralax  to 0.5 capful 1x a day It takes time to find the perfect dose and you sometimes have to increase Miralax  multiple times. You can safely increase Miralax  to 3 capfuls twice a day. This process will take several months.   Mild cramping and loose stools may be expected the first few days, and when increasing dosages. Do not stop medications, adjust as needed. Call us  if any questions.   BEHAVIORAL MODIFICATION:  THE MOST IMPORTANT THING FOR LONG TERM SUCCESS  Sit on the toilet for 5-10 minutes 3 times daily at the same time every day, to develop a habit. After meals often works well While on the toilet, legs should be resting on a stool or feet should be flat on the floor; knees apart and bending forward so the chest touches the upper legs (This position opens up the rectum). Breathe out through harmonica/bubble/balloon for 10 seconds, 5 times. Push while sitting on the toilet.    Alexa Andrews, MD

## 2023-07-17 NOTE — Assessment & Plan Note (Signed)
-   Increase Mounjaro  as above

## 2023-07-17 NOTE — Assessment & Plan Note (Signed)
 A1c is down to 7.2% from 8.6%.  - Continue metformin  1000mg  BID  - Increase mounjaro  to 10mg  weekly for added weight loss benefit - Declines statin

## 2023-07-17 NOTE — Progress Notes (Signed)
    SUBJECTIVE:   CHIEF COMPLAINT / HPI:   DM2  Obesity  OSA Patient previously on Mounjaro  5mg /week. Has been out for about two weeks. Was told by pharmacy that insurance wouldn't cover due to some documentation error. Discussed with our pharmacy team that this was likely due to lack of documentation of trial and failure of max dose metformin .  She has been on 1g BID of Metformin  XR since 04/18/2023. Weight is up 11 pounds over that time period despite both Metformin  and Mounjaro . A1c has improved to 7.2% from 8.6%.   Constipation  Has been bothering her recently. Most notably straining for BM flares up her chronic back pain. She is in PT for her back pain. She is not currently using a daily stool softener as she was hopeful to get adequate symptom control just through eating a high-fiber, vegan diet. Unfortunately this has not been the case.  Colonoscopy  currently scheduled for June 11. But she is having shoulder surgery on Jun 4 and worried about laying on side. Will reschedule colonoscopy.    OBJECTIVE:   Ht 5\' 8"  (1.727 m)   Wt (!) 301 lb 3.2 oz (136.6 kg)   BMI 45.80 kg/m   General: alert & oriented, no apparent distress, well groomed HEENT: normocephalic, atraumatic, EOM grossly intact, oral mucosa moist, neck supple Respiratory: normal respiratory effort GI: non-distended Skin: no rashes, no jaundice Psych: appropriate mood and affect   ASSESSMENT/PLAN:   Assessment & Plan Type 2 diabetes mellitus with other skin complication, with long-term current use of insulin  (HCC) A1c is down to 7.2% from 8.6%.  - Continue metformin  1000mg  BID  - Increase mounjaro  to 10mg  weekly for added weight loss benefit - Declines statin Class 3 severe obesity in adult Unfortunately weight is up 11lbs. However, has been out of Mounjaro  x3 weeks (2 missed doses) due to insurance issues.  - Increase Mounjaro  to 10mg  weekly - Continues to eat a vegan diet   OSA on CPAP - Increase Mounjaro   as above  Constipation, unspecified constipation type - Add daily Miralax  - High fiber diet - Colonoscopy scheduled June 11, though patient will be rescheduling due to proximity to her shoulder surgery      J Lark Plum, MD [facility]

## 2023-07-18 ENCOUNTER — Telehealth: Payer: Self-pay

## 2023-07-18 ENCOUNTER — Other Ambulatory Visit (HOSPITAL_COMMUNITY): Payer: Self-pay

## 2023-07-18 MED ORDER — OZEMPIC (0.25 OR 0.5 MG/DOSE) 2 MG/3ML ~~LOC~~ SOPN
PEN_INJECTOR | SUBCUTANEOUS | 3 refills | Status: DC
Start: 2023-07-18 — End: 2023-10-23
  Filled 2023-07-18 – 2023-07-20 (×2): qty 3, 28d supply, fill #0
  Filled 2023-09-03: qty 3, 28d supply, fill #1

## 2023-07-18 NOTE — Telephone Encounter (Signed)
 Pharmacy Patient Advocate Encounter   Received notification from CoverMyMeds that prior authorization for OZEMPIC  0.25/0.5MG  is required/requested.   Insurance verification completed.   The patient is insured through [facility] MEDICAID .   PA required; PA submitted to above mentioned insurance via CoverMyMeds Key/confirmation #/EOC [employer]. Status is pending

## 2023-07-18 NOTE — Addendum Note (Signed)
 Addended by: Darell Echevaria B on: 07/18/2023 09:18 AM   Modules accepted: Orders

## 2023-07-18 NOTE — Telephone Encounter (Signed)
 Pharmacy Patient Advocate Encounter  Received notification from [facility] MEDICAID that Prior Authorization for OZEMPIC  0.25/0.5MG  has been APPROVED from 07/18/23 to 07/17/24   PA #/Case ID/Reference #: [id number]

## 2023-07-20 ENCOUNTER — Encounter: Payer: Self-pay | Admitting: Physical Therapy

## 2023-07-20 ENCOUNTER — Ambulatory Visit: Admitting: Physical Therapy

## 2023-07-20 ENCOUNTER — Other Ambulatory Visit (HOSPITAL_COMMUNITY): Payer: Self-pay

## 2023-07-20 DIAGNOSIS — M6281 Muscle weakness (generalized): Secondary | ICD-10-CM

## 2023-07-20 DIAGNOSIS — M5459 Other low back pain: Secondary | ICD-10-CM | POA: Diagnosis not present

## 2023-07-20 DIAGNOSIS — R2689 Other abnormalities of gait and mobility: Secondary | ICD-10-CM

## 2023-07-20 NOTE — Therapy (Signed)
 OUTPATIENT PHYSICAL THERAPY THORACOLUMBAR TREATMENT   Patient Name: [name] [name] MRN: [id number] DOB:29-Sep-1975, 48 y.o., female Today's Date: 07/20/2023  END OF SESSION:  PT End of Session - 07/20/23 0717     Visit Number 3    Number of Visits 17    Date for PT Re-Evaluation 08/22/23    Authorization Type [place] [employer]    PT Start Time 0715    PT Stop Time 0755    PT Time Calculation (min) 40 min             Past Medical History:  Diagnosis Date   Allergy    Anemia    Arthritis    Bipolar 1 disorder (HCC)    Sees Dr. Liborio Reeds, psychology,  [phone]   Carpal tunnel syndrome    Depression    Diabetes mellitus without complication (HCC)    Family history of adverse reaction to anesthesia    mother & son have difficulty waking   Fatigue    Ganglion of left wrist 07/26/2018   Lung nodule seen on imaging study 09/24/2013   Sleep apnea    uses cpap   Past Surgical History:  Procedure Laterality Date   ABDOMINAL HYSTERECTOMY     CHOLECYSTECTOMY N/A 06/22/2017   Procedure: LAPAROSCOPIC CHOLECYSTECTOMY;  Surgeon: Derral Flick, MD;  Location: [facility] OR;  Service: General;  Laterality: N/A;   I & D EXTREMITY Right 09/14/2015   Procedure: IRRIGATION AND DEBRIDEMENT RIGHT MIDDLE FINGER;  Surgeon: Ronn Cohn, MD;  Location: [facility] OR;  Service: Orthopedics;  Laterality: Right;   TUBAL LIGATION     Patient Active Problem List   Diagnosis Date Noted   Excessive daytime sleepiness 12/27/2022   Sleep attack 12/27/2022   Nightmare disorder 12/27/2022   Snoring 12/27/2022   Chronic right shoulder pain 11/16/2022   ADHD 01/03/2022   Urine frequency 08/08/2018   OSA on CPAP 02/09/2016   Diabetes mellitus (HCC) 07/11/2012   Pompholyx eczema 11/12/2011   Vitamin D  deficiency 09/20/2007   MENOPAUSE, SURGICAL 04/24/2007   Diaphragmatic hernia 11/20/2006   Hyperlipidemia 05/03/2006   Class 3 severe obesity in adult 05/03/2006   Bipolar I disorder (HCC) 05/03/2006    ANXIETY 05/03/2006   HYPERTENSION, BENIGN SYSTEMIC 05/03/2006   PEPTIC ULCER DIS., UNSPEC. W/O OBSTRUCTION 05/03/2006    PCP: Limmie Ren, MD  REFERRING PROVIDER: Darryll Eng, NP  REFERRING DIAG:  M54.42,M54.41,G89.29 (ICD-10-CM) - Chronic bilateral low back pain with bilateral sciatica M54.16 (ICD-10-CM) - Lumbar radiculopathy K59.09 (ICD-10-CM) - Chronic constipation E66.01 (ICD-10-CM) - Morbid (severe) obesity due to excess calories (HCC)  Rationale for Evaluation and Treatment: Rehabilitation  THERAPY DIAG:  Other low back pain  Muscle weakness (generalized)  Other abnormalities of gait and mobility  ONSET DATE: Chronic  SUBJECTIVE:  SUBJECTIVE STATEMENT: I have been walking more and doing more of the abdominal squeezes. 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    I have not completed the HEP but I know I need to. I would like to come 3 x per week until my shoulder surgery.    EVAL: Pt presents to PT with reports of chronic LBP that refers into bilateral posterior thighs with N/T down to feet. Notes that pain is worse when she is constipated and this happens fairly frequently when she misses her metformin . Has a colonoscopy on 4/26 to assess possible GI issues. Denies bowel/bladder changes or saddle anesthesia. No trauma or MOI, pain has been getting worse over time. She is currently schedule to have shoulder surgery but wants to hold off on this if possible. Feels like her standing activity tolerance is limited to 10 minutes at most right now. Had to stop working due to back and shoulder pain.  PERTINENT HISTORY:  DM, Bipolar disorder  PAIN:  Are you having pain?  Yes: NPRS scale: 7/10 Worst: 10/10 Pain location: lower back, bilateral LE Pain  description: sharp, sore, N/T Aggravating factors: constipation, movement Relieving factors: none  PRECAUTIONS: None  RED FLAGS: None   WEIGHT BEARING RESTRICTIONS: No  FALLS:  Has patient fallen in last 6 months? No  LIVING ENVIRONMENT: Lives with: lives with their family Lives in: House/apartment Stairs: Yes: External: 4 steps; can reach both Has following equipment at home: None  OCCUPATION: Not currently working  PLOF: Independent  PATIENT GOALS: decrease back pain, improve standing tolerance (10 minutes at most right now)  NEXT MD VISIT: Nothing currently scheduling - awaiting MRI results   OBJECTIVE:  Note: Objective measures were completed at Evaluation unless otherwise noted.  DIAGNOSTIC FINDINGS:  Awaiting MRI results  PATIENT SURVEYS:  ODI: 20/50 - 40% disability  COGNITION: Overall cognitive status: Within functional limits for tasks assessed     SENSATION: Light touch: Impaired - posterior thighs   MUSCLE LENGTH: Thomas test: Right (+); Left (+)  POSTURE: rounded shoulders, forward head, increased lumbar lordosis, and large body habitus  PALPATION: TTP to bilateral lumbar paraspinals  LUMBAR ROM:   AROM eval  Flexion 50% pain  Extension 75% pain  Right lateral flexion   Left lateral flexion   Right rotation   Left rotation    (Blank rows = not tested)  LOWER EXTREMITY MMT:    MMT Right eval Left eval  Hip flexion 4 4  Hip extension    Hip abduction 4 4  Hip adduction    Hip internal rotation    Hip external rotation    Knee flexion    Knee extension    Ankle dorsiflexion    Ankle plantarflexion    Ankle inversion    Ankle eversion     (Blank rows = not tested)  LUMBAR SPECIAL TESTS:  Straight leg raise test: Positive and Slump test: Positive  FUNCTIONAL TESTS:  30 Second Sit to Stand: 6 reps with UE  GAIT: Distance walked: 90ft Assistive device utilized: None Level of assistance: SBA Comments: trunk flexed,  decreased gait speed  TREATMENT: [facility] Adult PT Treatment:                                                DATE: 07/20/23 Therapeutic Exercise: Nustep L5  Le only  (shoulder pain)  Seated PPT  Standing ab draw in with march Bridge  Supine Ab brace with SLR  Supine clam blue  Banded bridge blue  LTR Supine h/s curls using exercise ball with ab brace Bridge with legs over ball   Ab crunch 10 x 2  Hip flexor stretch EOM bilat        [facility] Adult PT Treatment:                                                DATE: 07/13/23 Therapeutic Exercise: Supine PPT PPT to bridge LTR Supine march  Seated PPT  Seated march Seated clam blue band  Seated sciatic nerve glide  Standing hip flexor stretch  Seated lumbar flexion with and with exercise ball Updated HEP  Therapeutic Activity: Nustep L4 Le only x 5 minutes  STS x 5 -c/o knee pain     [facility] Adult PT Treatment:                                                DATE: 06/27/2023 Therapeutic Exercise: Modified thomas x 60" R Supine PPT x 5 Bridge x 5 LTR x 5 Seated sciatic nerve glide x 5 each  PATIENT EDUCATION:  Education details: eval findings, ODI, HEP, POC Person educated: Patient Education method: Explanation, Demonstration, and Handouts Education comprehension: verbalized understanding and returned demonstration  HOME EXERCISE PROGRAM: Access Code: [email or web address] URL: [email or web address] Date: 06/27/2023 Prepared by: Loral Roch  Exercises - Modified Thomas Stretch  - 1 x daily - 7 x weekly - 2 reps - 60 sec hold - Supine Posterior Pelvic Tilt  - 1 x daily - 7 x weekly - 2 sets - 10 reps - 5 sec hold - Supine Bridge  - 1 x daily - 7 x weekly - 2 sets - 10 reps - Supine Lower Trunk Rotation  - 1 x daily - 7 x weekly - 2 sets - 10 reps - Seated Sciatic Tensioner  - 1 x daily - 7 x weekly - 2 sets - 15 reps 07/13/23 - Seated Pelvic Tilt  - 1 x daily - 7 x weekly - 2 sets - 10 reps - Seated March  - 1 x daily -  7 x weekly - 2 sets - 10 reps - seated clam with resistance band  - 1 x daily - 7 x weekly - 2 sets - 10 reps - Sit to stand with control  - 1 x daily - 7 x weekly - 2 sets - 10 reps - Seated Flexion Stretch  - 1 x daily - 7 x weekly - 1 sets - 10 reps - 5-10 hold  ASSESSMENT:  CLINICAL IMPRESSION: 7/10 pain on arrival. Pt reports her back and leg pain has been less. She has been more active in general and has been bracing abdominals in standing, not really completing HEP as prescribed. Worked mostly in supine today. Pt is limited by shoulder pain (upcoming shoulder surgery). No complaints of back pain during session.     EVAL: Patient is a 48 y.o. F who was seen today for physical therapy evaluation and treatment for chronic LBP that refers into bilateral posterior LE. Physical findings are consistent  with referring provider impression as pt demonstrates decrease in hip/core strength and general functional mobility. ODI score shows severe disability in performance of home ADLs and community activities. Pt would benefit from skilled PT services working on improving strength and activity tolerance in order to decrease back pain and improve function and standing time.    OBJECTIVE IMPAIRMENTS: Abnormal gait, decreased activity tolerance, decreased balance, decreased endurance, decreased mobility, difficulty walking, decreased ROM, decreased strength, postural dysfunction, and pain  ACTIVITY LIMITATIONS: carrying, lifting, bending, sitting, standing, squatting, stairs, and transfers  PARTICIPATION LIMITATIONS: meal prep, cleaning, driving, shopping, community activity, occupation, and yard work  PERSONAL FACTORS: Time since onset of injury/illness/exacerbation and 1-2 comorbidities: DM, Bipolar disorder are also affecting patient's functional outcome.   REHAB POTENTIAL: Good  CLINICAL DECISION MAKING: Evolving/moderate complexity  EVALUATION COMPLEXITY: Moderate   GOALS: Goals reviewed  with patient? No  SHORT TERM GOALS: Target date: 07/18/2023   Pt will be compliant and knowledgeable with initial HEP for improved comfort and carryover Baseline: initial HEP given  Goal status: ONGOING  2.  Pt will self report low back and LE pain no greater than 7/10 for improved comfort and functional ability Baseline: 10/10 at worst 07/20/23: 7/10 recently  Goal status: ONGOING   LONG TERM GOALS: Target date: 08/22/2023   Pt will be decrease ODI disability score to no greater than 25% (12/50) as proxy for functional improvement Baseline: 40% disability (20/50) Goal status: INITIAL  2.  Pt will self report low back and LE pain no greater than 3/10 for improved comfort and functional ability Baseline: 10/10 at worst Goal status: INITIAL   3.  Pt will increase 30 Second Sit to Stand rep count to no less than 10 reps for improved balance, strength, and functional mobility Baseline: 6 reps with UE Goal status: INITIAL   4.  Pt will improve standing activity tolerance to no less than 30 minutes before limitation secondary to LBP in order to improve functional activity tolerance and ADL performance Baseline: 10 minutes Goal status: INITIAL  5.  Pt will improve all LE MMT to no less than 4+/5 for improved comfort and functional mobility Baseline: see MMT chart Goal status: INITIAL  PLAN:  PT FREQUENCY: 1-2x/week  PT DURATION: 8 weeks  PLANNED INTERVENTIONS: 97164- PT Re-evaluation, 97110-Therapeutic exercises, 97530- Therapeutic activity, 97112- Neuromuscular re-education, 97535- Self Care, 16109- Manual therapy, [id number]- Gait training, [phone]- Aquatic Therapy, [phone]- Electrical stimulation (unattended), [phone]- Electrical stimulation (manual), Dry Needling, Cryotherapy, and Moist heat  PLAN FOR NEXT SESSION: assess HEP response, neutral spine core strengthening, proximal hip strengthening    Gasper Karst, PTA 07/20/23 8:14 AM Phone: [phone] Fax: [phone]

## 2023-07-23 ENCOUNTER — Other Ambulatory Visit (HOSPITAL_COMMUNITY): Payer: Self-pay

## 2023-07-23 ENCOUNTER — Other Ambulatory Visit: Payer: Self-pay

## 2023-07-23 ENCOUNTER — Ambulatory Visit: Payer: Self-pay

## 2023-07-23 ENCOUNTER — Encounter: Payer: Self-pay | Admitting: Neurology

## 2023-07-23 DIAGNOSIS — M6281 Muscle weakness (generalized): Secondary | ICD-10-CM

## 2023-07-23 DIAGNOSIS — M5459 Other low back pain: Secondary | ICD-10-CM | POA: Diagnosis not present

## 2023-07-23 DIAGNOSIS — R2689 Other abnormalities of gait and mobility: Secondary | ICD-10-CM

## 2023-07-23 NOTE — Therapy (Signed)
 OUTPATIENT PHYSICAL THERAPY THORACOLUMBAR TREATMENT   Patient Name: [name] [name] MRN: [id number] DOB:December 03, 1975, 48 y.o., female Today's Date: 07/23/2023  END OF SESSION:  PT End of Session - 07/23/23 1141     Visit Number 4    Number of Visits 17    Date for PT Re-Evaluation 08/22/23    Authorization Type [place] [employer]    PT Start Time 1143    PT Stop Time 1223    PT Time Calculation (min) 40 min    Activity Tolerance Patient limited by pain    Behavior During Therapy [facility] for tasks assessed/performed              Past Medical History:  Diagnosis Date   Allergy    Anemia    Arthritis    Bipolar 1 disorder (HCC)    Sees Dr. Liborio Reeds, psychology,  [phone]   Carpal tunnel syndrome    Depression    Diabetes mellitus without complication (HCC)    Family history of adverse reaction to anesthesia    mother & son have difficulty waking   Fatigue    Ganglion of left wrist 07/26/2018   Lung nodule seen on imaging study 09/24/2013   Sleep apnea    uses cpap   Past Surgical History:  Procedure Laterality Date   ABDOMINAL HYSTERECTOMY     CHOLECYSTECTOMY N/A 06/22/2017   Procedure: LAPAROSCOPIC CHOLECYSTECTOMY;  Surgeon: Derral Flick, MD;  Location: [facility] OR;  Service: General;  Laterality: N/A;   I & D EXTREMITY Right 09/14/2015   Procedure: IRRIGATION AND DEBRIDEMENT RIGHT MIDDLE FINGER;  Surgeon: Ronn Cohn, MD;  Location: [facility] OR;  Service: Orthopedics;  Laterality: Right;   TUBAL LIGATION     Patient Active Problem List   Diagnosis Date Noted   Excessive daytime sleepiness 12/27/2022   Sleep attack 12/27/2022   Nightmare disorder 12/27/2022   Snoring 12/27/2022   Chronic right shoulder pain 11/16/2022   ADHD 01/03/2022   Urine frequency 08/08/2018   OSA on CPAP 02/09/2016   Diabetes mellitus (HCC) 07/11/2012   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 W/O OBSTRUCTION 05/03/2006    PCP: Limmie Ren, MD  REFERRING PROVIDER: Darryll Eng, NP  REFERRING DIAG:  M54.42,M54.41,G89.29 (ICD-10-CM) - Chronic bilateral low back pain with bilateral sciatica M54.16 (ICD-10-CM) - Lumbar radiculopathy K59.09 (ICD-10-CM) - Chronic constipation E66.01 (ICD-10-CM) - Morbid (severe) obesity due to excess calories (HCC)  Rationale for Evaluation and Treatment: Rehabilitation  THERAPY DIAG:  Other low back pain  Muscle weakness (generalized)  Other abnormalities of gait and mobility  ONSET DATE: Chronic  SUBJECTIVE:  SUBJECTIVE STATEMENT: Pt presents to PT with continued reports of LBP. Feels like she has continued to be constipated. Compliant with HEP.   EVAL: Pt presents to PT with reports of chronic LBP that refers into bilateral posterior thighs with N/T down to feet. Notes that pain is worse when she is constipated and this happens fairly frequently when she misses her metformin . Has a colonoscopy on 4/26 to assess possible GI issues. Denies bowel/bladder changes or saddle anesthesia. No trauma or MOI, pain has been getting worse over time. She is currently schedule to have shoulder surgery but wants to hold off on this if possible. Feels like her standing activity tolerance is limited to 10 minutes at most right now. Had to stop working due to back and shoulder pain.  PERTINENT HISTORY:  DM, Bipolar disorder  PAIN:  Are you having pain?  Yes: NPRS scale: 7/10 Worst: 10/10 Pain location: lower back, bilateral LE Pain description: sharp, sore,  N/T Aggravating factors: constipation, movement Relieving factors: none  PRECAUTIONS: None  RED FLAGS: None   WEIGHT BEARING RESTRICTIONS: No  FALLS:  Has patient fallen in last 6 months? No  LIVING ENVIRONMENT: Lives with: lives with their family Lives in: House/apartment Stairs: Yes: External: 4 steps; can reach both Has following equipment at home: None  OCCUPATION: Not currently working  PLOF: Independent  PATIENT GOALS: decrease back pain, improve standing tolerance (10 minutes at most right now)  NEXT MD VISIT: Nothing currently scheduling - awaiting MRI results   OBJECTIVE:  Note: Objective measures were completed at Evaluation unless otherwise noted.  DIAGNOSTIC FINDINGS:  Awaiting MRI results  PATIENT SURVEYS:  ODI: 20/50 - 40% disability  COGNITION: Overall cognitive status: Within functional limits for tasks assessed     SENSATION: Light touch: Impaired - posterior thighs   MUSCLE LENGTH: Thomas test: Right (+); Left (+)  POSTURE: rounded shoulders, forward head, increased lumbar lordosis, and large body habitus  PALPATION: TTP to bilateral lumbar paraspinals  LUMBAR ROM:   AROM eval  Flexion 50% pain  Extension 75% pain  Right lateral flexion   Left lateral flexion   Right rotation   Left rotation    (Blank rows = not tested)  LOWER EXTREMITY MMT:    MMT Right eval Left eval  Hip flexion 4 4  Hip extension    Hip abduction 4 4  Hip adduction    Hip internal rotation    Hip external rotation    Knee flexion    Knee extension    Ankle dorsiflexion    Ankle plantarflexion    Ankle inversion    Ankle eversion     (Blank rows = not tested)  LUMBAR SPECIAL TESTS:  Straight leg raise test: Positive and Slump test: Positive  FUNCTIONAL TESTS:  30 Second Sit to Stand: 6 reps with UE  GAIT: Distance walked: 29ft Assistive device utilized: None Level of assistance: SBA Comments: trunk flexed, decreased gait  speed  TREATMENT: [facility] Adult PT Treatment:                                                DATE: 07/23/23 Nustep L5 LE only x 5 min for functional activity tolerance Supine PPT x 10 - 5" hold Supine PPT with ball 2x10 Supine SLR with ab brace 2x10 Hooklying clamshell 3x15 with ab brace Bridge with blue  band 3x10 DKTC crunch with physioball 2x15 Modified thomas stretch x 60" each STS no UE 2x10  [facility] Adult PT Treatment:                                                DATE: 07/20/23 Therapeutic Exercise: Nustep L5 Le only  (shoulder pain)  Seated PPT  Standing ab draw in with march Bridge  Supine Ab brace with SLR  Supine clam blue  Banded bridge blue  LTR Supine h/s curls using exercise ball with ab brace Bridge with legs over ball   Ab crunch 10 x 2  Hip flexor stretch EOM bilat   [facility] Adult PT Treatment:                                                DATE: 07/13/23 Therapeutic Exercise: Supine PPT PPT to bridge LTR Supine march  Seated PPT  Seated march Seated clam blue band  Seated sciatic nerve glide  Standing hip flexor stretch  Seated lumbar flexion with and with exercise ball Updated HEP Therapeutic Activity: Nustep L4 Le only x 5 minutes  STS x 5 -c/o knee pain  [facility] Adult PT Treatment:                                                DATE: 06/27/2023 Therapeutic Exercise: Modified thomas x 60" R Supine PPT x 5 Bridge x 5 LTR x 5 Seated sciatic nerve glide x 5 each  PATIENT EDUCATION:  Education details: eval findings, ODI, HEP, POC Person educated: Patient Education method: Explanation, Demonstration, and Handouts Education comprehension: verbalized understanding and returned demonstration  HOME EXERCISE PROGRAM: Access Code: [email or web address] URL: [email or web address] Date: 06/27/2023 Prepared by: Loral Roch  Exercises - Modified Thomas Stretch  - 1 x daily - 7 x weekly - 2 reps - 60 sec hold - Supine Posterior Pelvic Tilt  - 1 x daily - 7 x  weekly - 2 sets - 10 reps - 5 sec hold - Supine Bridge  - 1 x daily - 7 x weekly - 2 sets - 10 reps - Supine Lower Trunk Rotation  - 1 x daily - 7 x weekly - 2 sets - 10 reps - Seated Sciatic Tensioner  - 1 x daily - 7 x weekly - 2 sets - 15 reps 07/13/23 - Seated Pelvic Tilt  - 1 x daily - 7 x weekly - 2 sets - 10 reps - Seated March  - 1 x daily - 7 x weekly - 2 sets - 10 reps - seated clam with resistance band  - 1 x daily - 7 x weekly - 2 sets - 10 reps - Sit to stand with control  - 1 x daily - 7 x weekly - 2 sets - 10 reps - Seated Flexion Stretch  - 1 x daily - 7 x weekly - 1 sets - 10 reps - 5-10 hold  ASSESSMENT:  CLINICAL IMPRESSION: Pt was able to complete prescribed exercises despite continued pain. Today we continued to focus on improving core and proximal hip strength and  and improving functional mobility. Pt is progressing with therapy, continues to have decrease in hip flexor length and weakness. PT will progress as tolerated per POC.   EVAL: Patient is a 48 y.o. F who was seen today for physical therapy evaluation and treatment for chronic LBP that refers into bilateral posterior LE. Physical findings are consistent with referring provider impression as pt demonstrates decrease in hip/core strength and general functional mobility. ODI score shows severe disability in performance of home ADLs and community activities. Pt would benefit from skilled PT services working on improving strength and activity tolerance in order to decrease back pain and improve function and standing time.    OBJECTIVE IMPAIRMENTS: Abnormal gait, decreased activity tolerance, decreased balance, decreased endurance, decreased mobility, difficulty walking, decreased ROM, decreased strength, postural dysfunction, and pain  ACTIVITY LIMITATIONS: carrying, lifting, bending, sitting, standing, squatting, stairs, and transfers  PARTICIPATION LIMITATIONS: meal prep, cleaning, driving, shopping, community activity,  occupation, and yard work  PERSONAL FACTORS: Time since onset of injury/illness/exacerbation and 1-2 comorbidities: DM, Bipolar disorder are also affecting patient's functional outcome.   REHAB POTENTIAL: Good  CLINICAL DECISION MAKING: Evolving/moderate complexity  EVALUATION COMPLEXITY: Moderate   GOALS: Goals reviewed with patient? No  SHORT TERM GOALS: Target date: 07/18/2023   Pt will be compliant and knowledgeable with initial HEP for improved comfort and carryover Baseline: initial HEP given  Goal status: ONGOING  2.  Pt will self report low back and LE pain no greater than 7/10 for improved comfort and functional ability Baseline: 10/10 at worst 07/20/23: 7/10 recently  Goal status: ONGOING   LONG TERM GOALS: Target date: 08/22/2023   Pt will be decrease ODI disability score to no greater than 25% (12/50) as proxy for functional improvement Baseline: 40% disability (20/50) Goal status: INITIAL  2.  Pt will self report low back and LE pain no greater than 3/10 for improved comfort and functional ability Baseline: 10/10 at worst Goal status: INITIAL   3.  Pt will increase 30 Second Sit to Stand rep count to no less than 10 reps for improved balance, strength, and functional mobility Baseline: 6 reps with UE Goal status: INITIAL   4.  Pt will improve standing activity tolerance to no less than 30 minutes before limitation secondary to LBP in order to improve functional activity tolerance and ADL performance Baseline: 10 minutes Goal status: INITIAL  5.  Pt will improve all LE MMT to no less than 4+/5 for improved comfort and functional mobility Baseline: see MMT chart Goal status: INITIAL  PLAN:  PT FREQUENCY: 1-2x/week  PT DURATION: 8 weeks  PLANNED INTERVENTIONS: 97164- PT Re-evaluation, 97110-Therapeutic exercises, 97530- Therapeutic activity, [id number]- Neuromuscular re-education, 97535- Self Care, 16109- Manual therapy, [id number]- Gait training, [phone]- Aquatic  Therapy, [phone]- Electrical stimulation (unattended), [id number]- Electrical stimulation (manual), Dry Needling, Cryotherapy, and Moist heat  PLAN FOR NEXT SESSION: assess HEP response, neutral spine core strengthening, proximal hip strengthening    Ivor Mars PT  07/23/23 12:24 PM

## 2023-07-23 NOTE — Addendum Note (Signed)
 Addended by: Ivor Mars on: 07/23/2023 07:50 AM   Modules accepted: Orders

## 2023-07-24 ENCOUNTER — Other Ambulatory Visit (HOSPITAL_COMMUNITY): Payer: Self-pay

## 2023-07-24 ENCOUNTER — Other Ambulatory Visit: Payer: Self-pay | Admitting: Physician Assistant

## 2023-07-24 ENCOUNTER — Encounter: Payer: Self-pay | Admitting: Neurology

## 2023-07-24 ENCOUNTER — Telehealth: Payer: No Typology Code available for payment source | Admitting: Neurology

## 2023-07-24 ENCOUNTER — Telehealth: Payer: Self-pay | Admitting: Neurology

## 2023-07-24 DIAGNOSIS — G4733 Obstructive sleep apnea (adult) (pediatric): Secondary | ICD-10-CM | POA: Diagnosis not present

## 2023-07-24 DIAGNOSIS — G4719 Other hypersomnia: Secondary | ICD-10-CM | POA: Diagnosis not present

## 2023-07-24 DIAGNOSIS — Z6841 Body Mass Index (BMI) 40.0 and over, adult: Secondary | ICD-10-CM

## 2023-07-24 MED ORDER — HYDROCODONE-ACETAMINOPHEN 5-325 MG PO TABS
1.0000 | ORAL_TABLET | Freq: Three times a day (TID) | ORAL | 0 refills | Status: DC | PRN
  Filled 2023-07-24: qty 21, 7d supply, fill #0

## 2023-07-24 MED ORDER — ONDANSETRON HCL 4 MG PO TABS
4.0000 mg | ORAL_TABLET | Freq: Three times a day (TID) | ORAL | 0 refills | Status: DC | PRN
  Filled 2023-07-24 – 2023-08-27 (×2): qty 40, 14d supply, fill #0

## 2023-07-24 MED ORDER — MODAFINIL 200 MG PO TABS
200.0000 mg | ORAL_TABLET | Freq: Every day | ORAL | 2 refills | Status: AC
  Filled 2023-07-24: qty 30, 30d supply, fill #0
  Filled 2023-08-27: qty 30, 30d supply, fill #1
  Filled 2023-09-28: qty 30, 30d supply, fill #2
  Filled 2023-10-23 – 2023-11-02 (×2): qty 30, 30d supply, fill #3

## 2023-07-24 NOTE — Progress Notes (Signed)
 Virtual Visit via Video Note  I connected with [name] [name] on 07/24/23 at  4:00 PM EDT by a video enabled telemedicine application and verified that I am speaking with the correct person using two identifiers.  Location: Patient:  not connected at 16. 14 minutes , finally connected from her home at 16. 24  Provider: at [facility] , in her office .    I discussed the limitations of evaluation and management by telemedicine and the availability of in person appointments. The patient expressed understanding and agreed to proceed.  History of Present Illness: OSA on CPAP-  The patient has been using her CPAP at 100 % compliance , residual AHI is  2.2/h. Still sleepier than her peers.  Watch Pat HST was performed  01-19-2023, FSS at  46/ 63 points,  16.1/h was baseline AHI .    Epworth prior to CPAP was 21/ 24 points.     Observations/Objective:.The Epworth SS  was endorsed at 13/ 24 points.  We are going to continue with PAP therapy and add modafinil  for daytime sleepiness. Overall , she has responded well to CPAP therapy .   Reports still vivid dreams. Has rarely had episodes of not catching her breath since being on CPAP.   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    Assessment and Plan:  1) refilled Provigil  , generic . 1 tab a day Po in AM     2) OSA needs continued CPAP therapy.   Follow Up Instructions:   RV in 6 months for refills of Provigil   ( modafinil  )with NP. Can be virtual - I gave 90 days, 200 mg daily and 2 refills.     I discussed the assessment and treatment plan with the patient. The patient was provided an opportunity to ask questions and all were answered. The patient agreed with the plan and demonstrated an understanding of the instructions.   The patient was advised to call back or seek an in-person evaluation if the symptoms worsen or if the condition fails to improve as anticipated.  I provided 12 minutes of  non-face-to-face time during this encounter.   Neomia Banner, MD

## 2023-07-24 NOTE — Telephone Encounter (Signed)
 Late Entry: Spoke w/Pt regarding waiting for VV to start. Pt agreed to sign in for a later VV with provider. Pt was scheduled for 4:00 PM VV Pt voiced understanding and thanked for the call back.

## 2023-07-24 NOTE — Patient Instructions (Addendum)
 OSA needs continued CPAP therapy on current settings. .    Follow Up Instructions:    RV in 6 months for refills of Provigil   ( modafinil  )with NP. Can be virtual - I gave 90 days, 200 mg daily and 2 refills.  CPAP data will need to be reviewed in 12 months from now.   CD   Modafinil  Tablets What is this medication? MODAFINIL  (moe DAF i nil) treats sleep disorders, such as narcolepsy, obstructive sleep apnea, and shift work disorder. It works by promoting wakefulness. It belongs to a group of medications called stimulants. This medicine may be used for other purposes; ask your health care provider or pharmacist if you have questions. COMMON BRAND NAME(S): Provigil  What should I tell my care team before I take this medication? They need to know if you have any of these conditions: Kidney disease Liver disease Mental health conditions An unusual or allergic reaction to modafinil , other medications, foods, dyes, or preservatives Pregnant or trying to get pregnant Breast-feeding How should I use this medication? Take this medication by mouth with water . Take it as directed on the prescription label at the same time every day. You can take it with or without food. If it upsets your stomach, take it with food. Keep taking it unless your care team tells you to stop. A special MedGuide will be given to you by the pharmacist with each prescription and refill. Be sure to read this information carefully each time. Talk to your care team about the use of this medication in children. Special care may be needed. Overdosage: If you think you have taken too much of this medicine contact a poison control center or emergency room at once. NOTE: This medicine is only for you. Do not share this medicine with others. What if I miss a dose? If you miss a dose, take it as soon as you can. If it is almost time for your next dose, take only that dose. Do not take double or extra doses. What may interact with  this medication? Do not take this medication with any of the following: Amphetamine  or dextroamphetamine  Dexmethylphenidate or methylphenidate  MAOIs, such as Marplan, Nardil, and Parnate Pemoline Procarbazine This medication may also interact with the following: Antifungal medications, such as itraconazole or ketoconazole Barbiturates, such as phenobarbital Carbamazepine Cyclosporine Diazepam  Estrogen or progestin hormones Medications for mental health conditions Phenytoin Propranolol Triazolam Warfarin This list may not describe all possible interactions. Give your health care provider a list of all the medicines, herbs, non-prescription drugs, or dietary supplements you use. Also tell them if you smoke, drink alcohol, or use illegal drugs. Some items may interact with your medicine. What should I watch for while using this medication? Visit your care team for regular checks on your progress. It may be some time before you see the benefit from this medication. This medication may affect your coordination, reaction time, or judgment. Do not drive or operate machinery until you know how this medication affects you. Sit up or stand slowly to reduce the risk of dizzy or fainting spells. Drinking alcohol with this medication can increase the risk of these side effects. This medication may cause serious skin reactions. They can happen weeks to months after starting the medication. Contact your care team right away if you notice fevers or flu-like symptoms with a rash. The rash may be red or purple and then turn into blisters or peeling of the skin. You may also notice a red rash with  swelling of the face, lips, or lymph nodes in your neck or under your arms. Estrogen and progestin hormones may not work as well while you are taking this medication. Your care team can help you find the contraceptive option that works for you. It is unknown if the effects of this medication will be increased by the  use of caffeine. Caffeine is found in many foods, beverages, and medications. Ask your care team if you should limit or change your intake of caffeine-containing products while on this medication. What side effects may I notice from receiving this medication? Side effects that you should report to your care team as soon as possible: Allergic reactions or angioedema--skin rash, itching or hives, swelling of the face, eyes, lips, tongue, arms, or legs, trouble swallowing or breathing Increase in blood pressure Mood and behavior changes--anxiety, nervousness, confusion, hallucinations, irritability, hostility, thoughts of suicide or self-harm, worsening mood, feelings of depression Rash, fever, and swollen lymph nodes Redness, blistering, peeling, or loosening of the skin, including inside the mouth Side effects that usually do not require medical attention (report to your care team if they continue or are bothersome): Anxiety, nervousness Dizziness Headache Nausea Trouble sleeping This list may not describe all possible side effects. Call your doctor for medical advice about side effects. You may report side effects to FDA at 1-800-FDA-1088. Where should I keep my medication? Keep out of the reach of children and pets. This medication can be abused. Keep it in a safe place to protect it from theft. Do not share it with anyone. It is only for you. Selling or giving away this medication is dangerous and against the law. Store at room temperature between 20 and 25 degrees C (68 and 77 degrees F). Get rid of any unused medication after the expiration date. This medication may cause harm and death if it is taken by other adults, children, or pets. It is important to get rid of the medication as soon as you no longer need it or it is expired. You can do this in two ways: Take the medication to a medication take-back program. Check with your pharmacy or law enforcement to find a location. If you cannot  return the medication, check the label or package insert to see if the medication should be thrown out in the garbage or flushed down the toilet. If you are not sure, ask your care team. If it is safe to put it in the trash, take the medication out of the container. Mix the medication with cat litter, dirt, coffee grounds, or other unwanted substance. Seal the mixture in a bag or container. Put it in the trash. NOTE: This sheet is a summary. It may not cover all possible information. If you have questions about this medicine, talk to your doctor, pharmacist, or health care provider.  2024 Elsevier/Gold Standard (2022-03-31 00:00:00)OSA on CPAP-  The patient has been using her CPAP at 100 % compliance , residual AHI is  2.2/h. Still sleepier than her peers.  Watch Pat HST was performed  01-19-2023, FSS at  46/ 63 points,  16.1/h was baseline AHI .     Epworth prior to CPAP was 21/ 24 points.      Observations/Objective:.The Epworth SS  was endorsed at 13/ 24 points.  We are going to continue with PAP therapy and add modafinil  for daytime sleepiness. Overall , she has responded well to CPAP therapy .    Reports still vivid dreams. Has rarely had episodes of  not catching her breath since being on CPAP.    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

## 2023-07-24 NOTE — Telephone Encounter (Signed)
 At 3:06 pt left a vm stating she had been on line for 30 mins waiting for Dr to come on.  Pt stated she had to log off to go to another appointment.

## 2023-07-25 ENCOUNTER — Ambulatory Visit

## 2023-07-25 ENCOUNTER — Other Ambulatory Visit (HOSPITAL_COMMUNITY): Payer: Self-pay

## 2023-07-25 ENCOUNTER — Other Ambulatory Visit: Payer: Self-pay

## 2023-07-25 ENCOUNTER — Encounter: Admitting: Physician Assistant

## 2023-07-25 DIAGNOSIS — M5459 Other low back pain: Secondary | ICD-10-CM

## 2023-07-25 DIAGNOSIS — R2689 Other abnormalities of gait and mobility: Secondary | ICD-10-CM

## 2023-07-25 DIAGNOSIS — M6281 Muscle weakness (generalized): Secondary | ICD-10-CM

## 2023-07-25 NOTE — Therapy (Signed)
 OUTPATIENT PHYSICAL THERAPY THORACOLUMBAR TREATMENT   Patient Name: [name] [name] MRN: [id number] DOB:1975/12/27, 48 y.o., female Today's Date: 07/25/2023  END OF SESSION:  PT End of Session - 07/25/23 1122     Visit Number 5    Number of Visits 17    Date for PT Re-Evaluation 08/22/23    Authorization Type [place] [employer]    PT Start Time 1142    PT Stop Time 1222    PT Time Calculation (min) 40 min    Activity Tolerance Patient limited by pain    Behavior During Therapy [facility] for tasks assessed/performed               Past Medical History:  Diagnosis Date   Allergy    Anemia    Arthritis    Bipolar 1 disorder (HCC)    Sees Dr. Liborio Reeds, psychology,  [phone]   Carpal tunnel syndrome    Depression    Diabetes mellitus without complication (HCC)    Family history of adverse reaction to anesthesia    mother & son have difficulty waking   Fatigue    Ganglion of left wrist 07/26/2018   Lung nodule seen on imaging study 09/24/2013   Sleep apnea    uses cpap   Past Surgical History:  Procedure Laterality Date   ABDOMINAL HYSTERECTOMY     CHOLECYSTECTOMY N/A 06/22/2017   Procedure: LAPAROSCOPIC CHOLECYSTECTOMY;  Surgeon: Derral Flick, MD;  Location: [facility] OR;  Service: General;  Laterality: N/A;   I & D EXTREMITY Right 09/14/2015   Procedure: IRRIGATION AND DEBRIDEMENT RIGHT MIDDLE FINGER;  Surgeon: Ronn Cohn, MD;  Location: [facility] OR;  Service: Orthopedics;  Laterality: Right;   TUBAL LIGATION     Patient Active Problem List   Diagnosis Date Noted   Excessive daytime sleepiness 12/27/2022   Sleep attack 12/27/2022   Nightmare disorder 12/27/2022   Snoring 12/27/2022   Chronic right shoulder pain 11/16/2022   ADHD 01/03/2022   Urine frequency 08/08/2018   OSA on CPAP 02/09/2016   Diabetes mellitus (HCC) 07/11/2012   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 W/O OBSTRUCTION 05/03/2006    PCP: Limmie Ren, MD  REFERRING PROVIDER: Darryll Eng, NP  REFERRING DIAG:  M54.42,M54.41,G89.29 (ICD-10-CM) - Chronic bilateral low back pain with bilateral sciatica M54.16 (ICD-10-CM) - Lumbar radiculopathy K59.09 (ICD-10-CM) - Chronic constipation E66.01 (ICD-10-CM) - Morbid (severe) obesity due to excess calories (HCC)  Rationale for Evaluation and Treatment: Rehabilitation  THERAPY DIAG:  Other low back pain  Muscle weakness (generalized)  Other abnormalities of gait and mobility  ONSET DATE: Chronic  SUBJECTIVE:  SUBJECTIVE STATEMENT: Pt arrives today with continued pain. Has been compliant with HEP.   EVAL: Pt presents to PT with reports of chronic LBP that refers into bilateral posterior thighs with N/T down to feet. Notes that pain is worse when she is constipated and this happens fairly frequently when she misses her metformin . Has a colonoscopy on 4/26 to assess possible GI issues. Denies bowel/bladder changes or saddle anesthesia. No trauma or MOI, pain has been getting worse over time. She is currently schedule to have shoulder surgery but wants to hold off on this if possible. Feels like her standing activity tolerance is limited to 10 minutes at most right now. Had to stop working due to back and shoulder pain.  PERTINENT HISTORY:  DM, Bipolar disorder  PAIN:  Are you having pain?  Yes: NPRS scale: 7/10 Worst: 10/10 Pain location: lower back, bilateral LE Pain description: sharp, sore, N/T Aggravating factors: constipation,  movement Relieving factors: none  PRECAUTIONS: None  RED FLAGS: None   WEIGHT BEARING RESTRICTIONS: No  FALLS:  Has patient fallen in last 6 months? No  LIVING ENVIRONMENT: Lives with: lives with their family Lives in: House/apartment Stairs: Yes: External: 4 steps; can reach both Has following equipment at home: None  OCCUPATION: Not currently working  PLOF: Independent  PATIENT GOALS: decrease back pain, improve standing tolerance (10 minutes at most right now)  NEXT MD VISIT: Nothing currently scheduling - awaiting MRI results   OBJECTIVE:  Note: Objective measures were completed at Evaluation unless otherwise noted.  DIAGNOSTIC FINDINGS:  Awaiting MRI results  PATIENT SURVEYS:  ODI: 20/50 - 40% disability  COGNITION: Overall cognitive status: Within functional limits for tasks assessed     SENSATION: Light touch: Impaired - posterior thighs   MUSCLE LENGTH: Thomas test: Right (+); Left (+)  POSTURE: rounded shoulders, forward head, increased lumbar lordosis, and large body habitus  PALPATION: TTP to bilateral lumbar paraspinals  LUMBAR ROM:   AROM eval  Flexion 50% pain  Extension 75% pain  Right lateral flexion   Left lateral flexion   Right rotation   Left rotation    (Blank rows = not tested)  LOWER EXTREMITY MMT:    MMT Right eval Left eval  Hip flexion 4 4  Hip extension    Hip abduction 4 4  Hip adduction    Hip internal rotation    Hip external rotation    Knee flexion    Knee extension    Ankle dorsiflexion    Ankle plantarflexion    Ankle inversion    Ankle eversion     (Blank rows = not tested)  LUMBAR SPECIAL TESTS:  Straight leg raise test: Positive and Slump test: Positive  FUNCTIONAL TESTS:  30 Second Sit to Stand: 6 reps with UE  GAIT: Distance walked: 71ft Assistive device utilized: None Level of assistance: SBA Comments: trunk flexed, decreased gait speed  TREATMENT: [facility] Adult PT Treatment:                                                 DATE: 07/25/23 Nustep L5 LE only x 5 min for functional activity tolerance Supine PPT x 10 - 5" hold Supine PPT with ball 2x10 Supine SLR 3x10 Hooklying clamshell 3x15 with ab brace Bridge (small range) 3x10 Modified thomas stretch x 90" each STS no UE 2x10  [facility] Adult PT Treatment:                                                DATE: 07/23/23 Nustep L5 LE only x 5 min for functional activity tolerance Supine PPT x 10 - 5" hold Supine PPT with ball 2x10 Supine SLR with ab brace 2x10 Hooklying clamshell 3x15 with ab brace Bridge with blue band 3x10 DKTC crunch with physioball 2x15 Modified thomas stretch x 60" each STS no UE 2x10  [facility] Adult PT Treatment:                                                DATE: 07/20/23 Therapeutic Exercise: Nustep L5 Le only  (shoulder pain)  Seated PPT  Standing ab draw in with march Bridge  Supine Ab brace with SLR  Supine clam blue  Banded bridge blue  LTR Supine h/s curls using exercise ball with ab brace Bridge with legs over ball   Ab crunch 10 x 2  Hip flexor stretch EOM bilat   [facility] Adult PT Treatment:                                                DATE: 07/13/23 Therapeutic Exercise: Supine PPT PPT to bridge LTR Supine march  Seated PPT  Seated march Seated clam blue band  Seated sciatic nerve glide  Standing hip flexor stretch  Seated lumbar flexion with and with exercise ball Updated HEP Therapeutic Activity: Nustep L4 Le only x 5 minutes  STS x 5 -c/o knee pain  [facility] Adult PT Treatment:                                                DATE: 06/27/2023 Therapeutic Exercise: Modified thomas x 60" R Supine PPT x 5 Bridge x 5 LTR x 5 Seated sciatic nerve glide x 5 each  PATIENT EDUCATION:  Education details: eval findings, ODI, HEP, POC Person educated: Patient Education method: Explanation, Demonstration, and Handouts Education comprehension: verbalized understanding and returned  demonstration  HOME EXERCISE PROGRAM: Access Code: [email or web address] URL: [email or web address] Date: 06/27/2023 Prepared by: Loral Roch  Exercises - Modified Andy Bannister Stretch  - 1 x daily - 7 x weekly - 2 reps - 60 sec hold - Supine Posterior Pelvic Tilt  - 1 x daily - 7 x weekly - 2 sets - 10 reps - 5 sec hold - Supine Bridge  - 1 x daily - 7 x weekly - 2 sets - 10 reps - Supine Lower Trunk Rotation  - 1 x daily - 7 x weekly - 2 sets - 10 reps - Seated Sciatic Tensioner  - 1 x daily - 7 x weekly - 2 sets - 15 reps 07/13/23 - Seated Pelvic Tilt  - 1 x daily - 7 x weekly - 2 sets - 10 reps - Seated March  - 1 x daily - 7 x weekly - 2 sets - 10 reps -  seated clam with resistance band  - 1 x daily - 7 x weekly - 2 sets - 10 reps - Sit to stand with control  - 1 x daily - 7 x weekly - 2 sets - 10 reps - Seated Flexion Stretch  - 1 x daily - 7 x weekly - 1 sets - 10 reps - 5-10 hold  ASSESSMENT:  CLINICAL IMPRESSION: Pt was able to complete prescribed exercises despite continued pain. Today we continued to focus on improving core and proximal hip strength and and improving functional mobility. Pt is progressing with therapy, continues to have decrease in hip flexor length and weakness. PT will progress as tolerated per POC.   EVAL: Patient is a 48 y.o. F who was seen today for physical therapy evaluation and treatment for chronic LBP that refers into bilateral posterior LE. Physical findings are consistent with referring provider impression as pt demonstrates decrease in hip/core strength and general functional mobility. ODI score shows severe disability in performance of home ADLs and community activities. Pt would benefit from skilled PT services working on improving strength and activity tolerance in order to decrease back pain and improve function and standing time.    OBJECTIVE IMPAIRMENTS: Abnormal gait, decreased activity tolerance, decreased balance, decreased endurance, decreased  mobility, difficulty walking, decreased ROM, decreased strength, postural dysfunction, and pain  ACTIVITY LIMITATIONS: carrying, lifting, bending, sitting, standing, squatting, stairs, and transfers  PARTICIPATION LIMITATIONS: meal prep, cleaning, driving, shopping, community activity, occupation, and yard work  PERSONAL FACTORS: Time since onset of injury/illness/exacerbation and 1-2 comorbidities: DM, Bipolar disorder are also affecting patient's functional outcome.   REHAB POTENTIAL: Good  CLINICAL DECISION MAKING: Evolving/moderate complexity  EVALUATION COMPLEXITY: Moderate   GOALS: Goals reviewed with patient? No  SHORT TERM GOALS: Target date: 07/18/2023   Pt will be compliant and knowledgeable with initial HEP for improved comfort and carryover Baseline: initial HEP given  Goal status: ONGOING  2.  Pt will self report low back and LE pain no greater than 7/10 for improved comfort and functional ability Baseline: 10/10 at worst 07/20/23: 7/10 recently  Goal status: ONGOING   LONG TERM GOALS: Target date: 08/22/2023   Pt will be decrease ODI disability score to no greater than 25% (12/50) as proxy for functional improvement Baseline: 40% disability (20/50) Goal status: INITIAL  2.  Pt will self report low back and LE pain no greater than 3/10 for improved comfort and functional ability Baseline: 10/10 at worst Goal status: INITIAL   3.  Pt will increase 30 Second Sit to Stand rep count to no less than 10 reps for improved balance, strength, and functional mobility Baseline: 6 reps with UE Goal status: INITIAL   4.  Pt will improve standing activity tolerance to no less than 30 minutes before limitation secondary to LBP in order to improve functional activity tolerance and ADL performance Baseline: 10 minutes Goal status: INITIAL  5.  Pt will improve all LE MMT to no less than 4+/5 for improved comfort and functional mobility Baseline: see MMT chart Goal status:  INITIAL  PLAN:  PT FREQUENCY: 1-2x/week  PT DURATION: 8 weeks  PLANNED INTERVENTIONS: 97164- PT Re-evaluation, 97110-Therapeutic exercises, 97530- Therapeutic activity, [id number]- Neuromuscular re-education, 97535- Self Care, 16109- Manual therapy, [id number]- Gait training, [phone]- Aquatic Therapy, [id number]- Electrical stimulation (unattended), [id number]- Electrical stimulation (manual), Dry Needling, Cryotherapy, and Moist heat  PLAN FOR NEXT SESSION: assess HEP response, neutral spine core strengthening, proximal hip strengthening    Ivor Mars PT  07/25/23 1:08 PM

## 2023-07-27 ENCOUNTER — Telehealth: Payer: Self-pay | Admitting: Orthopaedic Surgery

## 2023-07-27 ENCOUNTER — Ambulatory Visit: Admitting: Physical Therapy

## 2023-07-27 ENCOUNTER — Encounter: Payer: Self-pay | Admitting: Physical Therapy

## 2023-07-27 ENCOUNTER — Other Ambulatory Visit (HOSPITAL_COMMUNITY): Payer: Self-pay

## 2023-07-27 DIAGNOSIS — R2689 Other abnormalities of gait and mobility: Secondary | ICD-10-CM

## 2023-07-27 DIAGNOSIS — M5459 Other low back pain: Secondary | ICD-10-CM | POA: Diagnosis not present

## 2023-07-27 DIAGNOSIS — M6281 Muscle weakness (generalized): Secondary | ICD-10-CM

## 2023-07-27 NOTE — Therapy (Signed)
 OUTPATIENT PHYSICAL THERAPY THORACOLUMBAR TREATMENT   Patient Name: [name] [name] MRN: [id number] DOB:1975-06-30, 48 y.o., female Today's Date: 07/27/2023  END OF SESSION:  PT End of Session - 07/27/23 0847     Visit Number 6    Number of Visits 17    Date for PT Re-Evaluation 08/22/23    Authorization Type [place] [employer]    PT Start Time 0845    PT Stop Time 0930    PT Time Calculation (min) 45 min               Past Medical History:  Diagnosis Date   Allergy    Anemia    Arthritis    Bipolar 1 disorder (HCC)    Sees Dr. Liborio Reeds, psychology,  [phone]   Carpal tunnel syndrome    Depression    Diabetes mellitus without complication (HCC)    Family history of adverse reaction to anesthesia    mother & son have difficulty waking   Fatigue    Ganglion of left wrist 07/26/2018   Lung nodule seen on imaging study 09/24/2013   Sleep apnea    uses cpap   Past Surgical History:  Procedure Laterality Date   ABDOMINAL HYSTERECTOMY     CHOLECYSTECTOMY N/A 06/22/2017   Procedure: LAPAROSCOPIC CHOLECYSTECTOMY;  Surgeon: Derral Flick, MD;  Location: [facility] OR;  Service: General;  Laterality: N/A;   I & D EXTREMITY Right 09/14/2015   Procedure: IRRIGATION AND DEBRIDEMENT RIGHT MIDDLE FINGER;  Surgeon: Ronn Cohn, MD;  Location: [facility] OR;  Service: Orthopedics;  Laterality: Right;   TUBAL LIGATION     Patient Active Problem List   Diagnosis Date Noted   Excessive daytime sleepiness 12/27/2022   Sleep attack 12/27/2022   Nightmare disorder 12/27/2022   Snoring 12/27/2022   Chronic right shoulder pain 11/16/2022   ADHD 01/03/2022   Urine frequency 08/08/2018   OSA on CPAP 02/09/2016   Diabetes mellitus (HCC) 07/11/2012   Pompholyx eczema 11/12/2011   Vitamin D  deficiency 09/20/2007   MENOPAUSE, SURGICAL 04/24/2007   Diaphragmatic hernia 11/20/2006   Hyperlipidemia 05/03/2006   Class 3 severe obesity in adult 05/03/2006   Bipolar I disorder (HCC) 05/03/2006    ANXIETY 05/03/2006   HYPERTENSION, BENIGN SYSTEMIC 05/03/2006   PEPTIC ULCER DIS., UNSPEC. W/O OBSTRUCTION 05/03/2006    PCP: Limmie Ren, MD  REFERRING PROVIDER: Darryll Eng, NP  REFERRING DIAG:  M54.42,M54.41,G89.29 (ICD-10-CM) - Chronic bilateral low back pain with bilateral sciatica M54.16 (ICD-10-CM) - Lumbar radiculopathy K59.09 (ICD-10-CM) - Chronic constipation E66.01 (ICD-10-CM) - Morbid (severe) obesity due to excess calories (HCC)  Rationale for Evaluation and Treatment: Rehabilitation  THERAPY DIAG:  Other low back pain  Muscle weakness (generalized)  Other abnormalities of gait and mobility  ONSET DATE: Chronic  SUBJECTIVE:  SUBJECTIVE STATEMENT: Pt reports less intensity of overall pain.  She thinks it is related to her GI problems. No pain today.   EVAL: Pt presents to PT with reports of chronic LBP that refers into bilateral posterior thighs with N/T down to feet. Notes that pain is worse when she is constipated and this happens fairly frequently when she misses her metformin . Has a colonoscopy on 4/26 to assess possible GI issues. Denies bowel/bladder changes or saddle anesthesia. No trauma or MOI, pain has been getting worse over time. She is currently schedule to have shoulder surgery but wants to hold off on this if possible. Feels like her standing activity tolerance is limited to 10 minutes at most right now. Had to stop working due to back and shoulder pain.  PERTINENT HISTORY:  DM, Bipolar disorder  PAIN:  Are you having pain?  Yes: NPRS scale: 0/10 Worst: 10/10 Pain location: lower back, bilateral LE Pain description: sharp, sore, N/T Aggravating factors: constipation, movement Relieving factors: none  PRECAUTIONS: None  RED FLAGS: None   WEIGHT  BEARING RESTRICTIONS: No  FALLS:  Has patient fallen in last 6 months? No  LIVING ENVIRONMENT: Lives with: lives with their family Lives in: House/apartment Stairs: Yes: External: 4 steps; can reach both Has following equipment at home: None  OCCUPATION: Not currently working  PLOF: Independent  PATIENT GOALS: decrease back pain, improve standing tolerance (10 minutes at most right now)  NEXT MD VISIT: Nothing currently scheduling - awaiting MRI results   OBJECTIVE:  Note: Objective measures were completed at Evaluation unless otherwise noted.  DIAGNOSTIC FINDINGS:  Awaiting MRI results  PATIENT SURVEYS:  ODI: 20/50 - 40% disability  COGNITION: Overall cognitive status: Within functional limits for tasks assessed     SENSATION: Light touch: Impaired - posterior thighs   MUSCLE LENGTH: Thomas test: Right (+); Left (+)  POSTURE: rounded shoulders, forward head, increased lumbar lordosis, and large body habitus  PALPATION: TTP to bilateral lumbar paraspinals  LUMBAR ROM:   AROM eval  Flexion 50% pain  Extension 75% pain  Right lateral flexion   Left lateral flexion   Right rotation   Left rotation    (Blank rows = not tested)  LOWER EXTREMITY MMT:    MMT Right eval Left eval  Hip flexion 4 4  Hip extension    Hip abduction 4 4  Hip adduction    Hip internal rotation    Hip external rotation    Knee flexion    Knee extension    Ankle dorsiflexion    Ankle plantarflexion    Ankle inversion    Ankle eversion     (Blank rows = not tested)  LUMBAR SPECIAL TESTS:  Straight leg raise test: Positive and Slump test: Positive  FUNCTIONAL TESTS:  30 Second Sit to Stand: 6 reps with UE  GAIT: Distance walked: 24ft Assistive device utilized: None Level of assistance: SBA Comments: trunk flexed, decreased gait speed  TREATMENT: [facility] Adult PT Treatment:                                                DATE: 07/27/23 Nustep L5 LE only x 10 min for  functional activity tolerance  Seated PPT -with clams and marching  Supine bridge with feet on ball Supine h/s curls feet on ball  Supine mini crunch with ball-using left  arm only due to right shoulder pain Supine PPT x 10 - 5" hold Supine PPT with ball 2x10 Supine SLR 1x10 2# Hooklying clamshell 3x15 with ab brace blue  S/L clam blue band right to fatigue  Modified thomas stretch x 90" each STS no UE 2x10-elevated mat table    [facility] Adult PT Treatment:                                                DATE: 07/25/23 Nustep L5 LE only x 5 min for functional activity tolerance Supine PPT x 10 - 5" hold Supine PPT with ball 2x10 Supine SLR 3x10 Hooklying clamshell 3x15 with ab brace Bridge (small range) 3x10 Modified thomas stretch x 90" each STS no UE 2x10  [facility] Adult PT Treatment:                                                DATE: 07/23/23 Nustep L5 LE only x 5 min for functional activity tolerance Supine PPT x 10 - 5" hold Supine PPT with ball 2x10 Supine SLR with ab brace 2x10 Hooklying clamshell 3x15 with ab brace Bridge with blue band 3x10 DKTC crunch with physioball 2x15 Modified thomas stretch x 60" each STS no UE 2x10  [facility] Adult PT Treatment:                                                DATE: 07/20/23 Therapeutic Exercise: Nustep L5 Le only  (shoulder pain)  Seated PPT  Standing ab draw in with march Bridge  Supine Ab brace with SLR  Supine clam blue  Banded bridge blue  LTR Supine h/s curls using exercise ball with ab brace Bridge with legs over ball   Ab crunch 10 x 2  Hip flexor stretch EOM bilat   [facility] Adult PT Treatment:                                                DATE: 07/13/23 Therapeutic Exercise: Supine PPT PPT to bridge LTR Supine march  Seated PPT  Seated march Seated clam blue band  Seated sciatic nerve glide  Standing hip flexor stretch  Seated lumbar flexion with and with exercise ball Updated HEP Therapeutic Activity: Nustep L4 Le  only x 5 minutes  STS x 5 -c/o knee pain  [facility] Adult PT Treatment:                                                DATE: 06/27/2023 Therapeutic Exercise: Modified thomas x 60" R Supine PPT x 5 Bridge x 5 LTR x 5 Seated sciatic nerve glide x 5 each  PATIENT EDUCATION:  Education details: eval findings, ODI, HEP, POC Person educated: Patient Education method: Explanation, Demonstration, and Handouts Education comprehension: verbalized understanding and returned demonstration  HOME EXERCISE  PROGRAM: Access Code: [email or web address] URL: [email or web address] Date: 06/27/2023 Prepared by: Loral Roch  Exercises - Modified Thomas Stretch  - 1 x daily - 7 x weekly - 2 reps - 60 sec hold - Supine Posterior Pelvic Tilt  - 1 x daily - 7 x weekly - 2 sets - 10 reps - 5 sec hold - Supine Bridge  - 1 x daily - 7 x weekly - 2 sets - 10 reps - Supine Lower Trunk Rotation  - 1 x daily - 7 x weekly - 2 sets - 10 reps - Seated Sciatic Tensioner  - 1 x daily - 7 x weekly - 2 sets - 15 reps 07/13/23 - Seated Pelvic Tilt  - 1 x daily - 7 x weekly - 2 sets - 10 reps - Seated March  - 1 x daily - 7 x weekly - 2 sets - 10 reps - seated clam with resistance band  - 1 x daily - 7 x weekly - 2 sets - 10 reps - Sit to stand with control  - 1 x daily - 7 x weekly - 2 sets - 10 reps - Seated Flexion Stretch  - 1 x daily - 7 x weekly - 1 sets - 10 reps - 5-10 hold  ASSESSMENT:  CLINICAL IMPRESSION: Pt has no pain today, reports less intense pain overall. Today we continued to focus on improving core and proximal hip strength and and improving functional mobility. Pt is progressing with therapy, continues to have decrease in hip flexor length and weakness. PT will progress as tolerated per POC, one more week prior to shoulder surgery. Pt interested in testing core strength next visit, i/e 90/90 brace and  Bridge hold time.   EVAL: Patient is a 48 y.o. F who was seen today for physical therapy evaluation  and treatment for chronic LBP that refers into bilateral posterior LE. Physical findings are consistent with referring provider impression as pt demonstrates decrease in hip/core strength and general functional mobility. ODI score shows severe disability in performance of home ADLs and community activities. Pt would benefit from skilled PT services working on improving strength and activity tolerance in order to decrease back pain and improve function and standing time.    OBJECTIVE IMPAIRMENTS: Abnormal gait, decreased activity tolerance, decreased balance, decreased endurance, decreased mobility, difficulty walking, decreased ROM, decreased strength, postural dysfunction, and pain  ACTIVITY LIMITATIONS: carrying, lifting, bending, sitting, standing, squatting, stairs, and transfers  PARTICIPATION LIMITATIONS: meal prep, cleaning, driving, shopping, community activity, occupation, and yard work  PERSONAL FACTORS: Time since onset of injury/illness/exacerbation and 1-2 comorbidities: DM, Bipolar disorder are also affecting patient's functional outcome.   REHAB POTENTIAL: Good  CLINICAL DECISION MAKING: Evolving/moderate complexity  EVALUATION COMPLEXITY: Moderate   GOALS: Goals reviewed with patient? No  SHORT TERM GOALS: Target date: 07/18/2023   Pt will be compliant and knowledgeable with initial HEP for improved comfort and carryover Baseline: initial HEP given  Goal status: ONGOING  2.  Pt will self report low back and LE pain no greater than 7/10 for improved comfort and functional ability Baseline: 10/10 at worst 07/20/23: 7/10 recently  Goal status: ONGOING   LONG TERM GOALS: Target date: 08/22/2023   Pt will be decrease ODI disability score to no greater than 25% (12/50) as proxy for functional improvement Baseline: 40% disability (20/50) Goal status: INITIAL  2.  Pt will self report low back and LE pain no greater than 3/10 for improved comfort and functional  ability Baseline: 10/10  at worst Goal status: INITIAL   3.  Pt will increase 30 Second Sit to Stand rep count to no less than 10 reps for improved balance, strength, and functional mobility Baseline: 6 reps with UE Goal status: INITIAL   4.  Pt will improve standing activity tolerance to no less than 30 minutes before limitation secondary to LBP in order to improve functional activity tolerance and ADL performance Baseline: 10 minutes Goal status: INITIAL  5.  Pt will improve all LE MMT to no less than 4+/5 for improved comfort and functional mobility Baseline: see MMT chart Goal status: INITIAL  PLAN:  PT FREQUENCY: 1-2x/week  PT DURATION: 8 weeks  PLANNED INTERVENTIONS: 97164- PT Re-evaluation, 97110-Therapeutic exercises, 97530- Therapeutic activity, [id number]- Neuromuscular re-education, 97535- Self Care, 98119- Manual therapy, [id number]- Gait training, [phone]- Aquatic Therapy, [phone]- Electrical stimulation (unattended), [phone]- Electrical stimulation (manual), Dry Needling, Cryotherapy, and Moist heat  PLAN FOR NEXT SESSION: assess HEP response, neutral spine core strengthening, proximal hip strengthening    Susana Enter PTA  07/27/23 9:45 AM

## 2023-07-27 NOTE — Telephone Encounter (Signed)
 What can she take for pain

## 2023-07-27 NOTE — Telephone Encounter (Signed)
 Patient is scheduled for right shoulder arthroscopy, extensive debridement on 08-08-23 at [facility] with Dr. Christiane Cowing.  She has questions about medication that was called in for her, specifically questions due to her allergies with anything relating to morphine.  She is also questioning the location of her surgery and is asking if it would be safer for her to have surgery at [facility] if these meds are being prescribed.  Please call patient [phone]

## 2023-07-27 NOTE — Telephone Encounter (Signed)
 Pt stated she was prescribed medication she's allergic to. Hydrocodone . Pt cannot take any medication in morphine family. Pt will need a different medication for po

## 2023-07-31 ENCOUNTER — Other Ambulatory Visit (HOSPITAL_BASED_OUTPATIENT_CLINIC_OR_DEPARTMENT_OTHER): Payer: Self-pay

## 2023-07-31 ENCOUNTER — Other Ambulatory Visit (HOSPITAL_COMMUNITY): Payer: Self-pay

## 2023-07-31 ENCOUNTER — Telehealth: Payer: Self-pay | Admitting: Physician Assistant

## 2023-07-31 ENCOUNTER — Other Ambulatory Visit: Payer: Self-pay | Admitting: Physician Assistant

## 2023-07-31 MED ORDER — TRAMADOL HCL 50 MG PO TABS
50.0000 mg | ORAL_TABLET | Freq: Three times a day (TID) | ORAL | 2 refills | Status: DC | PRN
  Filled 2023-07-31: qty 30, 5d supply, fill #0

## 2023-07-31 NOTE — Progress Notes (Signed)
 While completing pre-op phone call, patient states she has been experiencing sore throat, nasal congestion, sneezing, and ear pain since Sunday 07/29/23. [facility] to [facility] regarding [facility], patient must be rescheduled 2 weeks after symptoms resolve. Message left for Debbie at Dr. Mollie Anger office regarding this information.

## 2023-07-31 NOTE — Telephone Encounter (Signed)
 Patient called. Tramadol  is ok to send to her pharmacy.

## 2023-07-31 NOTE — Telephone Encounter (Signed)
 sent

## 2023-07-31 NOTE — Telephone Encounter (Signed)
 We sent a message to her about this last week.  I just called and left a vm.  If she is unsure what pain meds she can take and is not allergic to, I will send in tramadol  (she can supplement with nsaids/tylenol ) as this is not as strong as the narcotics.  Just let me know what she says

## 2023-08-01 ENCOUNTER — Ambulatory Visit

## 2023-08-01 ENCOUNTER — Other Ambulatory Visit (HOSPITAL_COMMUNITY): Payer: Self-pay

## 2023-08-01 DIAGNOSIS — R2689 Other abnormalities of gait and mobility: Secondary | ICD-10-CM

## 2023-08-01 DIAGNOSIS — M5459 Other low back pain: Secondary | ICD-10-CM

## 2023-08-01 DIAGNOSIS — M6281 Muscle weakness (generalized): Secondary | ICD-10-CM

## 2023-08-01 NOTE — Therapy (Signed)
 OUTPATIENT PHYSICAL THERAPY THORACOLUMBAR TREATMENT   Patient Name: [name] GRUDZIEN MRN: [id number] DOB:Sep 09, 1975, 48 y.o., female Today's Date: 08/01/2023  END OF SESSION:  PT End of Session - 08/01/23 0929     Visit Number 7    Number of Visits 17    Date for PT Re-Evaluation 08/22/23    Authorization Type [employer]    PT Start Time 0930    PT Stop Time 1010    PT Time Calculation (min) 40 min                Past Medical History:  Diagnosis Date   Allergy    Anemia    Arthritis    Bipolar 1 disorder (HCC)    Sees Dr. Liborio Reeds, psychology,  [phone]   Carpal tunnel syndrome    Depression    Diabetes mellitus without complication (HCC)    Family history of adverse reaction to anesthesia    mother & son have difficulty waking   Fatigue    Ganglion of left wrist 07/26/2018   Lung nodule seen on imaging study 09/24/2013   Sleep apnea    uses cpap   Past Surgical History:  Procedure Laterality Date   ABDOMINAL HYSTERECTOMY     CHOLECYSTECTOMY N/A 06/22/2017   Procedure: LAPAROSCOPIC CHOLECYSTECTOMY;  Surgeon: Derral Flick, MD;  Location: [facility] OR;  Service: General;  Laterality: N/A;   I & D EXTREMITY Right 09/14/2015   Procedure: IRRIGATION AND DEBRIDEMENT RIGHT MIDDLE FINGER;  Surgeon: Ronn Cohn, MD;  Location: [facility] OR;  Service: Orthopedics;  Laterality: Right;   TUBAL LIGATION     Patient Active Problem List   Diagnosis Date Noted   Excessive daytime sleepiness 12/27/2022   Sleep attack 12/27/2022   Nightmare disorder 12/27/2022   Snoring 12/27/2022   Chronic right shoulder pain 11/16/2022   ADHD 01/03/2022   Urine frequency 08/08/2018   OSA on CPAP 02/09/2016   Diabetes mellitus (HCC) 07/11/2012   Pompholyx eczema 11/12/2011   Vitamin D  deficiency 09/20/2007   MENOPAUSE, SURGICAL 04/24/2007   Diaphragmatic hernia 11/20/2006   Hyperlipidemia 05/03/2006   Class 3 severe obesity in adult 05/03/2006   Bipolar I disorder (HCC) 05/03/2006    ANXIETY 05/03/2006   HYPERTENSION, BENIGN SYSTEMIC 05/03/2006   PEPTIC ULCER DIS., UNSPEC. W/O OBSTRUCTION 05/03/2006    PCP: Limmie Ren, MD  REFERRING PROVIDER: Darryll Eng, NP  REFERRING DIAG:  M54.42,M54.41,G89.29 (ICD-10-CM) - Chronic bilateral low back pain with bilateral sciatica M54.16 (ICD-10-CM) - Lumbar radiculopathy K59.09 (ICD-10-CM) - Chronic constipation E66.01 (ICD-10-CM) - Morbid (severe) obesity due to excess calories (HCC)  Rationale for Evaluation and Treatment: Rehabilitation  THERAPY DIAG:  Other low back pain  Muscle weakness (generalized)  Other abnormalities of gait and mobility  ONSET DATE: Chronic  SUBJECTIVE:  SUBJECTIVE STATEMENT: Pt presents to PT with reports of slight decrease in LBP. Has been compliant with HEP.   EVAL: Pt presents to PT with reports of chronic LBP that refers into bilateral posterior thighs with N/T down to feet. Notes that pain is worse when she is constipated and this happens fairly frequently when she misses her metformin . Has a colonoscopy on 4/26 to assess possible GI issues. Denies bowel/bladder changes or saddle anesthesia. No trauma or MOI, pain has been getting worse over time. She is currently schedule to have shoulder surgery but wants to hold off on this if possible. Feels like her standing activity tolerance is limited to 10 minutes at most right now. Had to stop working due to back and shoulder pain.  PERTINENT HISTORY:  DM, Bipolar disorder  PAIN:  Are you having pain?  Yes: NPRS scale: 0/10 Worst: 10/10 Pain location: lower back, bilateral LE Pain description: sharp, sore, N/T Aggravating factors: constipation, movement Relieving factors: none  PRECAUTIONS: None  RED FLAGS: None   WEIGHT BEARING  RESTRICTIONS: No  FALLS:  Has patient fallen in last 6 months? No  LIVING ENVIRONMENT: Lives with: lives with their family Lives in: House/apartment Stairs: Yes: External: 4 steps; can reach both Has following equipment at home: None  OCCUPATION: Not currently working  PLOF: Independent  PATIENT GOALS: decrease back pain, improve standing tolerance (10 minutes at most right now)  NEXT MD VISIT: Nothing currently scheduling - awaiting MRI results   OBJECTIVE:  Note: Objective measures were completed at Evaluation unless otherwise noted.  DIAGNOSTIC FINDINGS:  Awaiting MRI results  PATIENT SURVEYS:  ODI: 20/50 - 40% disability  COGNITION: Overall cognitive status: Within functional limits for tasks assessed     SENSATION: Light touch: Impaired - posterior thighs   MUSCLE LENGTH: Thomas test: Right (+); Left (+)  POSTURE: rounded shoulders, forward head, increased lumbar lordosis, and large body habitus  PALPATION: TTP to bilateral lumbar paraspinals  LUMBAR ROM:   AROM eval  Flexion 50% pain  Extension 75% pain  Right lateral flexion   Left lateral flexion   Right rotation   Left rotation    (Blank rows = not tested)  LOWER EXTREMITY MMT:    MMT Right eval Left eval  Hip flexion 4 4  Hip extension    Hip abduction 4 4  Hip adduction    Hip internal rotation    Hip external rotation    Knee flexion    Knee extension    Ankle dorsiflexion    Ankle plantarflexion    Ankle inversion    Ankle eversion     (Blank rows = not tested)  LUMBAR SPECIAL TESTS:  Straight leg raise test: Positive and Slump test: Positive  FUNCTIONAL TESTS:  30 Second Sit to Stand: 6 reps with UE  GAIT: Distance walked: 77ft Assistive device utilized: None Level of assistance: SBA Comments: trunk flexed, decreased gait speed  TREATMENT: [facility] Adult PT Treatment:                                                DATE: 08/01/23 Nustep L5 LE only x 5 min for functional  activity tolerance  Supine PPT x 10 - 5" hold Supine PPT with ball 2x10 Hooklying clamshell with PPT 2x15 blue band Supine SLR 2x15 2# each Bridge with feet on physioball 2x10  Supine h/s curl with physioball 2x15  90/90 hold 2x20" Modified thomas stretch x 90" each STS no UE 2x10-elevated mat table  [facility] Adult PT Treatment:                                                DATE: 07/27/23 Nustep L5 LE only x 10 min for functional activity tolerance  Seated PPT -with clams and marching  Supine bridge with feet on ball Supine h/s curls feet on ball  Supine mini crunch with ball-using left arm only due to right shoulder pain Supine PPT x 10 - 5" hold Supine PPT with ball 2x10 Supine SLR 1x10 2# Hooklying clamshell 3x15 with ab brace blue  S/L clam blue band right to fatigue  Modified thomas stretch x 90" each STS no UE 2x10-elevated mat table    [facility] Adult PT Treatment:                                                DATE: 07/25/23 Nustep L5 LE only x 5 min for functional activity tolerance Supine PPT x 10 - 5" hold Supine PPT with ball 2x10 Supine SLR 3x10 Hooklying clamshell 3x15 with ab brace Bridge (small range) 3x10 Modified thomas stretch x 90" each STS no UE 2x10  [facility] Adult PT Treatment:                                                DATE: 07/23/23 Nustep L5 LE only x 5 min for functional activity tolerance Supine PPT x 10 - 5" hold Supine PPT with ball 2x10 Supine SLR with ab brace 2x10 Hooklying clamshell 3x15 with ab brace Bridge with blue band 3x10 DKTC crunch with physioball 2x15 Modified thomas stretch x 60" each STS no UE 2x10  [facility] Adult PT Treatment:                                                DATE: 07/20/23 Therapeutic Exercise: Nustep L5 Le only  (shoulder pain)  Seated PPT  Standing ab draw in with march Bridge  Supine Ab brace with SLR  Supine clam blue  Banded bridge blue  LTR Supine h/s curls using exercise ball with ab brace Bridge with legs  over ball   Ab crunch 10 x 2  Hip flexor stretch EOM bilat   [facility] Adult PT Treatment:                                                DATE: 07/13/23 Therapeutic Exercise: Supine PPT PPT to bridge LTR Supine march  Seated PPT  Seated march Seated clam blue band  Seated sciatic nerve glide  Standing hip flexor stretch  Seated lumbar flexion with and with exercise ball Updated HEP Therapeutic Activity: Nustep L4 Le only x 5 minutes  STS x 5 -c/o knee pain  [facility] Adult PT Treatment:                                                DATE: 06/27/2023 Therapeutic Exercise: Modified thomas x 60" R Supine PPT x 5 Bridge x 5 LTR x 5 Seated sciatic nerve glide x 5 each  PATIENT EDUCATION:  Education details: eval findings, ODI, HEP, POC Person educated: Patient Education method: Explanation, Demonstration, and Handouts Education comprehension: verbalized understanding and returned demonstration  HOME EXERCISE PROGRAM: Access Code: [email or web address] URL: [email or web address] Date: 06/27/2023 Prepared by: Loral Roch  Exercises - Modified Thomas Stretch  - 1 x daily - 7 x weekly - 2 reps - 60 sec hold - Supine Posterior Pelvic Tilt  - 1 x daily - 7 x weekly - 2 sets - 10 reps - 5 sec hold - Supine Bridge  - 1 x daily - 7 x weekly - 2 sets - 10 reps - Supine Lower Trunk Rotation  - 1 x daily - 7 x weekly - 2 sets - 10 reps - Seated Sciatic Tensioner  - 1 x daily - 7 x weekly - 2 sets - 15 reps 07/13/23 - Seated Pelvic Tilt  - 1 x daily - 7 x weekly - 2 sets - 10 reps - Seated March  - 1 x daily - 7 x weekly - 2 sets - 10 reps - seated clam with resistance band  - 1 x daily - 7 x weekly - 2 sets - 10 reps - Sit to stand with control  - 1 x daily - 7 x weekly - 2 sets - 10 reps - Seated Flexion Stretch  - 1 x daily - 7 x weekly - 1 sets - 10 reps - 5-10 hold  ASSESSMENT:  CLINICAL IMPRESSION: Pt was able to complete prescribed exercises despite continued pain. Today we continued  to focus on improving core and proximal hip strength and improving functional mobility. Pt is progressing with therapy and shows improved tolerance core endurance. PT will progress pt as tolerated per POC.   EVAL: Patient is a 48 y.o. F who was seen today for physical therapy evaluation and treatment for chronic LBP that refers into bilateral posterior LE. Physical findings are consistent with referring provider impression as pt demonstrates decrease in hip/core strength and general functional mobility. ODI score shows severe disability in performance of home ADLs and community activities. Pt would benefit from skilled PT services working on improving strength and activity tolerance in order to decrease back pain and improve function and standing time.    OBJECTIVE IMPAIRMENTS: Abnormal gait, decreased activity tolerance, decreased balance, decreased endurance, decreased mobility, difficulty walking, decreased ROM, decreased strength, postural dysfunction, and pain  ACTIVITY LIMITATIONS: carrying, lifting, bending, sitting, standing, squatting, stairs, and transfers  PARTICIPATION LIMITATIONS: meal prep, cleaning, driving, shopping, community activity, occupation, and yard work  PERSONAL FACTORS: Time since onset of injury/illness/exacerbation and 1-2 comorbidities: DM, Bipolar disorder are also affecting patient's functional outcome.   REHAB POTENTIAL: Good  CLINICAL DECISION MAKING: Evolving/moderate complexity  EVALUATION COMPLEXITY: Moderate   GOALS: Goals reviewed with patient? No  SHORT TERM GOALS: Target date: 07/18/2023   Pt will be compliant and knowledgeable with initial HEP for improved comfort and carryover Baseline: initial HEP given  Goal status: ONGOING  2.  Pt will self  report low back and LE pain no greater than 7/10 for improved comfort and functional ability Baseline: 10/10 at worst 07/20/23: 7/10 recently  Goal status: ONGOING   LONG TERM GOALS: Target date:  08/22/2023   Pt will be decrease ODI disability score to no greater than 25% (12/50) as proxy for functional improvement Baseline: 40% disability (20/50) Goal status: INITIAL  2.  Pt will self report low back and LE pain no greater than 3/10 for improved comfort and functional ability Baseline: 10/10 at worst Goal status: INITIAL   3.  Pt will increase 30 Second Sit to Stand rep count to no less than 10 reps for improved balance, strength, and functional mobility Baseline: 6 reps with UE Goal status: INITIAL   4.  Pt will improve standing activity tolerance to no less than 30 minutes before limitation secondary to LBP in order to improve functional activity tolerance and ADL performance Baseline: 10 minutes Goal status: INITIAL  5.  Pt will improve all LE MMT to no less than 4+/5 for improved comfort and functional mobility Baseline: see MMT chart Goal status: INITIAL  PLAN:  PT FREQUENCY: 1-2x/week  PT DURATION: 8 weeks  PLANNED INTERVENTIONS: [id number]- PT Re-evaluation, 97110-Therapeutic exercises, 97530- Therapeutic activity, [id number]- Neuromuscular re-education, 97535- Self Care, [id number]- Manual therapy, [id number]- Gait training, [phone]- Aquatic Therapy, [phone]- Electrical stimulation (unattended), [id number]- Electrical stimulation (manual), Dry Needling, Cryotherapy, and Moist heat  PLAN FOR NEXT SESSION: assess HEP response, neutral spine core strengthening, proximal hip strengthening    Ivor Mars PT  08/01/23 10:15 AM

## 2023-08-02 ENCOUNTER — Ambulatory Visit

## 2023-08-02 ENCOUNTER — Other Ambulatory Visit (HOSPITAL_COMMUNITY): Payer: Self-pay

## 2023-08-02 ENCOUNTER — Ambulatory Visit (AMBULATORY_SURGERY_CENTER)

## 2023-08-02 VITALS — Ht 68.0 in | Wt 291.0 lb

## 2023-08-02 DIAGNOSIS — R2689 Other abnormalities of gait and mobility: Secondary | ICD-10-CM

## 2023-08-02 DIAGNOSIS — M5459 Other low back pain: Secondary | ICD-10-CM

## 2023-08-02 DIAGNOSIS — M6281 Muscle weakness (generalized): Secondary | ICD-10-CM

## 2023-08-02 DIAGNOSIS — Z1211 Encounter for screening for malignant neoplasm of colon: Secondary | ICD-10-CM

## 2023-08-02 MED ORDER — NA SULFATE-K SULFATE-MG SULF 17.5-3.13-1.6 GM/177ML PO SOLN
1.0000 | Freq: Once | ORAL | 0 refills | Status: AC
  Filled 2023-08-02: qty 354, 1d supply, fill #0

## 2023-08-02 NOTE — Therapy (Signed)
 OUTPATIENT PHYSICAL THERAPY THORACOLUMBAR TREATMENT   Patient Name: [name] [name] MRN: [id number] DOB:Jan 18, 1976, 48 y.o., female Today's Date: 08/02/2023  END OF SESSION:  PT End of Session - 08/02/23 0916     Visit Number 8    Number of Visits 17    Date for PT Re-Evaluation 08/22/23    Authorization Type [employer]    PT Start Time 0930    PT Stop Time 1010    PT Time Calculation (min) 40 min                 Past Medical History:  Diagnosis Date   Allergy    Anemia    Arthritis    Bipolar 1 disorder (HCC)    Sees Dr. Liborio Reeds, psychology,  [phone]   Carpal tunnel syndrome    Depression    Diabetes mellitus without complication (HCC)    Family history of adverse reaction to anesthesia    mother & son have difficulty waking   Fatigue    Ganglion of left wrist 07/26/2018   Lung nodule seen on imaging study 09/24/2013   Sleep apnea    uses cpap   Past Surgical History:  Procedure Laterality Date   ABDOMINAL HYSTERECTOMY     CHOLECYSTECTOMY N/A 06/22/2017   Procedure: LAPAROSCOPIC CHOLECYSTECTOMY;  Surgeon: Derral Flick, MD;  Location: [facility] OR;  Service: General;  Laterality: N/A;   I & D EXTREMITY Right 09/14/2015   Procedure: IRRIGATION AND DEBRIDEMENT RIGHT MIDDLE FINGER;  Surgeon: Ronn Cohn, MD;  Location: [facility] OR;  Service: Orthopedics;  Laterality: Right;   TUBAL LIGATION     Patient Active Problem List   Diagnosis Date Noted   Excessive daytime sleepiness 12/27/2022   Sleep attack 12/27/2022   Nightmare disorder 12/27/2022   Snoring 12/27/2022   Chronic right shoulder pain 11/16/2022   ADHD 01/03/2022   Urine frequency 08/08/2018   OSA on CPAP 02/09/2016   Diabetes mellitus (HCC) 07/11/2012   Pompholyx eczema 11/12/2011   Vitamin D  deficiency 09/20/2007   MENOPAUSE, SURGICAL 04/24/2007   Diaphragmatic hernia 11/20/2006   Hyperlipidemia 05/03/2006   Class 3 severe obesity in adult 05/03/2006   Bipolar I disorder (HCC) 05/03/2006    ANXIETY 05/03/2006   HYPERTENSION, BENIGN SYSTEMIC 05/03/2006   PEPTIC ULCER DIS., UNSPEC. W/O OBSTRUCTION 05/03/2006    PCP: Limmie Ren, MD  REFERRING PROVIDER: Darryll Eng, NP  REFERRING DIAG:  M54.42,M54.41,G89.29 (ICD-10-CM) - Chronic bilateral low back pain with bilateral sciatica M54.16 (ICD-10-CM) - Lumbar radiculopathy K59.09 (ICD-10-CM) - Chronic constipation E66.01 (ICD-10-CM) - Morbid (severe) obesity due to excess calories (HCC)  Rationale for Evaluation and Treatment: Rehabilitation  THERAPY DIAG:  Other low back pain  Muscle weakness (generalized)  Other abnormalities of gait and mobility  ONSET DATE: Chronic  SUBJECTIVE:  SUBJECTIVE STATEMENT: Pt presents to PT with reports of muscle soreness after last session.  EVAL: Pt presents to PT with reports of chronic LBP that refers into bilateral posterior thighs with N/T down to feet. Notes that pain is worse when she is constipated and this happens fairly frequently when she misses her metformin . Has a colonoscopy on 4/26 to assess possible GI issues. Denies bowel/bladder changes or saddle anesthesia. No trauma or MOI, pain has been getting worse over time. She is currently schedule to have shoulder surgery but wants to hold off on this if possible. Feels like her standing activity tolerance is limited to 10 minutes at most right now. Had to stop working due to back and shoulder pain.  PERTINENT HISTORY:  DM, Bipolar disorder  PAIN:  Are you having pain?  Yes: NPRS scale: 0/10 Worst: 10/10 Pain location: lower back, bilateral LE Pain description: sharp, sore, N/T Aggravating factors: constipation, movement Relieving factors: none  PRECAUTIONS: None  RED FLAGS: None   WEIGHT BEARING RESTRICTIONS: No  FALLS:   Has patient fallen in last 6 months? No  LIVING ENVIRONMENT: Lives with: lives with their family Lives in: House/apartment Stairs: Yes: External: 4 steps; can reach both Has following equipment at home: None  OCCUPATION: Not currently working  PLOF: Independent  PATIENT GOALS: decrease back pain, improve standing tolerance (10 minutes at most right now)  NEXT MD VISIT: Nothing currently scheduling - awaiting MRI results   OBJECTIVE:  Note: Objective measures were completed at Evaluation unless otherwise noted.  DIAGNOSTIC FINDINGS:  Awaiting MRI results  PATIENT SURVEYS:  ODI: 20/50 - 40% disability  COGNITION: Overall cognitive status: Within functional limits for tasks assessed     SENSATION: Light touch: Impaired - posterior thighs   MUSCLE LENGTH: Thomas test: Right (+); Left (+)  POSTURE: rounded shoulders, forward head, increased lumbar lordosis, and large body habitus  PALPATION: TTP to bilateral lumbar paraspinals  LUMBAR ROM:   AROM eval  Flexion 50% pain  Extension 75% pain  Right lateral flexion   Left lateral flexion   Right rotation   Left rotation    (Blank rows = not tested)  LOWER EXTREMITY MMT:    MMT Right eval Left eval  Hip flexion 4 4  Hip extension    Hip abduction 4 4  Hip adduction    Hip internal rotation    Hip external rotation    Knee flexion    Knee extension    Ankle dorsiflexion    Ankle plantarflexion    Ankle inversion    Ankle eversion     (Blank rows = not tested)  LUMBAR SPECIAL TESTS:  Straight leg raise test: Positive and Slump test: Positive  FUNCTIONAL TESTS:  30 Second Sit to Stand: 6 reps with UE  GAIT: Distance walked: 75ft Assistive device utilized: None Level of assistance: SBA Comments: trunk flexed, decreased gait speed  TREATMENT: [facility] Adult PT Treatment:                                                DATE: 08/02/23 Nustep L5 LE only x 5 min for functional activity tolerance  Supine  PPT x 10 - 5" hold Supine PPT with ball 2x10 Hooklying clamshell with PPT 2x15 black band Supine SLR 2x15 2.5# each 90/90 hold 2x20" Modified thomas stretch x 90" each Bridge with  feet on physioball 2x10 Supine h/s curl with physioball 2x15  Omega leg press 3x10 55#  [facility] Adult PT Treatment:                                                DATE: 08/01/23 Nustep L5 LE only x 5 min for functional activity tolerance  Supine PPT x 10 - 5" hold Supine PPT with ball 2x10 Hooklying clamshell with PPT 2x15 blue band Supine SLR 2x15 2# each Bridge with feet on physioball 2x10 Supine h/s curl with physioball 2x15  90/90 hold 2x20" Modified thomas stretch x 90" each STS no UE 2x10-elevated mat table  [facility] Adult PT Treatment:                                                DATE: 07/27/23 Nustep L5 LE only x 10 min for functional activity tolerance  Seated PPT -with clams and marching  Supine bridge with feet on ball Supine h/s curls feet on ball  Supine mini crunch with ball-using left arm only due to right shoulder pain Supine PPT x 10 - 5" hold Supine PPT with ball 2x10 Supine SLR 1x10 2# Hooklying clamshell 3x15 with ab brace blue  S/L clam blue band right to fatigue  Modified thomas stretch x 90" each STS no UE 2x10-elevated mat table    [facility] Adult PT Treatment:                                                DATE: 07/25/23 Nustep L5 LE only x 5 min for functional activity tolerance Supine PPT x 10 - 5" hold Supine PPT with ball 2x10 Supine SLR 3x10 Hooklying clamshell 3x15 with ab brace Bridge (small range) 3x10 Modified thomas stretch x 90" each STS no UE 2x10  [facility] Adult PT Treatment:                                                DATE: 07/23/23 Nustep L5 LE only x 5 min for functional activity tolerance Supine PPT x 10 - 5" hold Supine PPT with ball 2x10 Supine SLR with ab brace 2x10 Hooklying clamshell 3x15 with ab brace Bridge with blue band 3x10 DKTC crunch with  physioball 2x15 Modified thomas stretch x 60" each STS no UE 2x10  [facility] Adult PT Treatment:                                                DATE: 07/20/23 Therapeutic Exercise: Nustep L5 Le only  (shoulder pain)  Seated PPT  Standing ab draw in with march Bridge  Supine Ab brace with SLR  Supine clam blue  Banded bridge blue  LTR Supine h/s curls using exercise ball with ab brace Bridge with legs over ball   Ab crunch 10 x 2  Hip flexor stretch EOM bilat   [facility] Adult PT Treatment:                                                DATE: 07/13/23 Therapeutic Exercise: Supine PPT PPT to bridge LTR Supine march  Seated PPT  Seated march Seated clam blue band  Seated sciatic nerve glide  Standing hip flexor stretch  Seated lumbar flexion with and with exercise ball Updated HEP Therapeutic Activity: Nustep L4 Le only x 5 minutes  STS x 5 -c/o knee pain  [facility] Adult PT Treatment:                                                DATE: 06/27/2023 Therapeutic Exercise: Modified thomas x 60" R Supine PPT x 5 Bridge x 5 LTR x 5 Seated sciatic nerve glide x 5 each  PATIENT EDUCATION:  Education details: eval findings, ODI, HEP, POC Person educated: Patient Education method: Explanation, Demonstration, and Handouts Education comprehension: verbalized understanding and returned demonstration  HOME EXERCISE PROGRAM: Access Code: [email or web address] URL: [email or web address] Date: 06/27/2023 Prepared by: Loral Roch  Exercises - Modified Thomas Stretch  - 1 x daily - 7 x weekly - 2 reps - 60 sec hold - Supine Posterior Pelvic Tilt  - 1 x daily - 7 x weekly - 2 sets - 10 reps - 5 sec hold - Supine Bridge  - 1 x daily - 7 x weekly - 2 sets - 10 reps - Supine Lower Trunk Rotation  - 1 x daily - 7 x weekly - 2 sets - 10 reps - Seated Sciatic Tensioner  - 1 x daily - 7 x weekly - 2 sets - 15 reps 07/13/23 - Seated Pelvic Tilt  - 1 x daily - 7 x weekly - 2 sets - 10 reps - Seated  March  - 1 x daily - 7 x weekly - 2 sets - 10 reps - seated clam with resistance band  - 1 x daily - 7 x weekly - 2 sets - 10 reps - Sit to stand with control  - 1 x daily - 7 x weekly - 2 sets - 10 reps - Seated Flexion Stretch  - 1 x daily - 7 x weekly - 1 sets - 10 reps - 5-10 hold  ASSESSMENT:  CLINICAL IMPRESSION: Pt was able to complete prescribed exercises with improved tolerance. Today we continued to focus on improving core and proximal hip strength and improving functional mobility. Pt is progressing with therapy and shows improved tolerance core endurance. PT will progress pt as tolerated per POC and assess goals next session depending on future R shoulder surgery date.   EVAL: Patient is a 48 y.o. F who was seen today for physical therapy evaluation and treatment for chronic LBP that refers into bilateral posterior LE. Physical findings are consistent with referring provider impression as pt demonstrates decrease in hip/core strength and general functional mobility. ODI score shows severe disability in performance of home ADLs and community activities. Pt would benefit from skilled PT services working on improving strength and activity tolerance in order to decrease back pain and improve function and standing time.    OBJECTIVE IMPAIRMENTS: Abnormal gait, decreased activity  tolerance, decreased balance, decreased endurance, decreased mobility, difficulty walking, decreased ROM, decreased strength, postural dysfunction, and pain  ACTIVITY LIMITATIONS: carrying, lifting, bending, sitting, standing, squatting, stairs, and transfers  PARTICIPATION LIMITATIONS: meal prep, cleaning, driving, shopping, community activity, occupation, and yard work  PERSONAL FACTORS: Time since onset of injury/illness/exacerbation and 1-2 comorbidities: DM, Bipolar disorder are also affecting patient's functional outcome.   REHAB POTENTIAL: Good  CLINICAL DECISION MAKING: Evolving/moderate  complexity  EVALUATION COMPLEXITY: Moderate   GOALS: Goals reviewed with patient? No  SHORT TERM GOALS: Target date: 07/18/2023   Pt will be compliant and knowledgeable with initial HEP for improved comfort and carryover Baseline: initial HEP given  Goal status: ONGOING  2.  Pt will self report low back and LE pain no greater than 7/10 for improved comfort and functional ability Baseline: 10/10 at worst 07/20/23: 7/10 recently  Goal status: ONGOING   LONG TERM GOALS: Target date: 08/22/2023   Pt will be decrease ODI disability score to no greater than 25% (12/50) as proxy for functional improvement Baseline: 40% disability (20/50) Goal status: INITIAL  2.  Pt will self report low back and LE pain no greater than 3/10 for improved comfort and functional ability Baseline: 10/10 at worst Goal status: INITIAL   3.  Pt will increase 30 Second Sit to Stand rep count to no less than 10 reps for improved balance, strength, and functional mobility Baseline: 6 reps with UE Goal status: INITIAL   4.  Pt will improve standing activity tolerance to no less than 30 minutes before limitation secondary to LBP in order to improve functional activity tolerance and ADL performance Baseline: 10 minutes Goal status: INITIAL  5.  Pt will improve all LE MMT to no less than 4+/5 for improved comfort and functional mobility Baseline: see MMT chart Goal status: INITIAL  PLAN:  PT FREQUENCY: 1-2x/week  PT DURATION: 8 weeks  PLANNED INTERVENTIONS: 97164- PT Re-evaluation, 97110-Therapeutic exercises, 97530- Therapeutic activity, [id number]- Neuromuscular re-education, 97535- Self Care, 16109- Manual therapy, [id number]- Gait training, [phone]- Aquatic Therapy, [phone]- Electrical stimulation (unattended), [id number]- Electrical stimulation (manual), Dry Needling, Cryotherapy, and Moist heat  PLAN FOR NEXT SESSION: assess HEP response, neutral spine core strengthening, proximal hip strengthening    Ivor Mars  PT  08/02/23 10:11 AM

## 2023-08-02 NOTE — Progress Notes (Signed)
 Pre visit completed via phone call; Patient verified name, DOB, and address; No egg or soy allergy known to patient  No issues known to pt with past sedation with any surgeries or procedures Patient denies ever being told they had issues or difficulty with intubation  No FH of Malignant Hyperthermia Pt is not on diet pills Pt is not on home 02  Pt is not on blood thinners  Pt reports issues with constipation-patient taking Miralax  daily for assistance with constipation;  No A fib or A flutter Have any cardiac testing pending--NO Insurance verified during PV appt--- [facility] Medicaid Pt can ambulate without assistance;  Pt denies use of chewing tobacco Discussed diabetic/weight loss medication holds; Discussed NSAID holds; Checked BMI to be less than 50; Pt instructed to use [employer] or GoodRx for a price reduction on prep  Patient's chart reviewed by Rogena Class CNRA prior to previsit and patient appropriate for the [facility].  Pre visit completed and red dot placed by patient's name on their procedure day (on provider's schedule).    Instructions sent to MyChart as well as printed and mailed to the patient per her request;   Patient rescheduled her procedure for a later date as she is currently scheduled for rotator cuff sx on 08/08/2023.

## 2023-08-03 ENCOUNTER — Encounter: Payer: Self-pay | Admitting: Physical Therapy

## 2023-08-03 ENCOUNTER — Ambulatory Visit: Admitting: Physical Therapy

## 2023-08-03 DIAGNOSIS — R2689 Other abnormalities of gait and mobility: Secondary | ICD-10-CM

## 2023-08-03 DIAGNOSIS — M5459 Other low back pain: Secondary | ICD-10-CM

## 2023-08-03 DIAGNOSIS — M6281 Muscle weakness (generalized): Secondary | ICD-10-CM

## 2023-08-03 NOTE — Therapy (Addendum)
 OUTPATIENT PHYSICAL THERAPY THORACOLUMBAR TREATMENT/DISCHARGE  PHYSICAL THERAPY DISCHARGE SUMMARY  Visits from Start of Care: 9  Current functional level related to goals / functional outcomes: See goals and objective   Remaining deficits: See goals and objective   Education / Equipment: HEP   Patient agrees to discharge. Patient goals were partially met. Patient is being discharged due to a change in medical status.   Patient Name: [name] [name] MRN: [id number] DOB:31-Jul-1975, 48 y.o., female Today's Date: 08/03/2023  END OF SESSION:  PT End of Session - 08/03/23 0933     Visit Number 9    Number of Visits 17    Date for PT Re-Evaluation 08/22/23    Authorization Type [place] [employer]    PT Start Time 0930    PT Stop Time 1008    PT Time Calculation (min) 38 min                 Past Medical History:  Diagnosis Date   Allergy    Anemia    Arthritis    Bipolar 1 disorder (HCC)    Sees Dr. Liborio Reeds, psychology,  [phone]   Carpal tunnel syndrome    Depression    Diabetes mellitus without complication (HCC)    Family history of adverse reaction to anesthesia    mother & son have difficulty waking   Fatigue    Ganglion of left wrist 07/26/2018   Lung nodule seen on imaging study 09/24/2013   Sleep apnea    uses cpap   Past Surgical History:  Procedure Laterality Date   ABDOMINAL HYSTERECTOMY     CHOLECYSTECTOMY N/A 06/22/2017   Procedure: LAPAROSCOPIC CHOLECYSTECTOMY;  Surgeon: Derral Flick, MD;  Location: [facility] OR;  Service: General;  Laterality: N/A;   I & D EXTREMITY Right 09/14/2015   Procedure: IRRIGATION AND DEBRIDEMENT RIGHT MIDDLE FINGER;  Surgeon: Ronn Cohn, MD;  Location: [facility] OR;  Service: Orthopedics;  Laterality: Right;   TUBAL LIGATION     Patient Active Problem List   Diagnosis Date Noted   Excessive daytime sleepiness 12/27/2022   Sleep attack 12/27/2022   Nightmare disorder 12/27/2022   Snoring 12/27/2022   Chronic right  shoulder pain 11/16/2022   ADHD 01/03/2022   Urine frequency 08/08/2018   OSA on CPAP 02/09/2016   Diabetes mellitus (HCC) 07/11/2012   Pompholyx eczema 11/12/2011   Vitamin D  deficiency 09/20/2007   MENOPAUSE, SURGICAL 04/24/2007   Diaphragmatic hernia 11/20/2006   Hyperlipidemia 05/03/2006   Class 3 severe obesity in adult 05/03/2006   Bipolar I disorder (HCC) 05/03/2006   ANXIETY 05/03/2006   HYPERTENSION, BENIGN SYSTEMIC 05/03/2006   PEPTIC ULCER DIS., UNSPEC. W/O OBSTRUCTION 05/03/2006    PCP: Limmie Ren, MD  REFERRING PROVIDER: Darryll Eng, NP  REFERRING DIAG:  M54.42,M54.41,G89.29 (ICD-10-CM) - Chronic bilateral low back pain with bilateral sciatica M54.16 (ICD-10-CM) - Lumbar radiculopathy K59.09 (ICD-10-CM) - Chronic constipation E66.01 (ICD-10-CM) - Morbid (severe) obesity due to excess calories (HCC)  Rationale for Evaluation and Treatment: Rehabilitation  THERAPY DIAG:  Other low back pain  Muscle weakness (generalized)  Other abnormalities of gait and mobility  ONSET DATE: Chronic  SUBJECTIVE:  SUBJECTIVE STATEMENT: Pt reports pain is intermittent.   EVAL: Pt presents to PT with reports of chronic LBP that refers into bilateral posterior thighs with N/T down to feet. Notes that pain is worse when she is constipated and this happens fairly frequently when she misses her metformin . Has a colonoscopy on 4/26 to assess possible GI issues. Denies bowel/bladder changes or saddle anesthesia. No trauma or MOI, pain has been getting worse over time. She is currently schedule to have shoulder surgery but wants to hold off on this if possible. Feels like her standing activity tolerance is limited to 10 minutes at most right now. Had to stop working due to back and shoulder  pain.  PERTINENT HISTORY:  DM, Bipolar disorder  PAIN:  Are you having pain?  Yes: NPRS scale: 0/10 Worst: 10/10 Pain location: lower back, bilateral LE Pain description: sharp, sore, N/T Aggravating factors: constipation, movement Relieving factors: none  PRECAUTIONS: None  RED FLAGS: None   WEIGHT BEARING RESTRICTIONS: No  FALLS:  Has patient fallen in last 6 months? No  LIVING ENVIRONMENT: Lives with: lives with their family Lives in: House/apartment Stairs: Yes: External: 4 steps; can reach both Has following equipment at home: None  OCCUPATION: Not currently working  PLOF: Independent  PATIENT GOALS: decrease back pain, improve standing tolerance (10 minutes at most right now)  NEXT MD VISIT: Nothing currently scheduling - awaiting MRI results   OBJECTIVE:  Note: Objective measures were completed at Evaluation unless otherwise noted.  DIAGNOSTIC FINDINGS:  Awaiting MRI results  PATIENT SURVEYS:  ODI: 20/50 - 40% disability ODI: 36% 08/03/23  COGNITION: Overall cognitive status: Within functional limits for tasks assessed     SENSATION: Light touch: Impaired - posterior thighs   MUSCLE LENGTH: Thomas test: Right (+); Left (+)  POSTURE: rounded shoulders, forward head, increased lumbar lordosis, and large body habitus  PALPATION: TTP to bilateral lumbar paraspinals  LUMBAR ROM:   AROM eval  Flexion 50% pain  Extension 75% pain  Right lateral flexion   Left lateral flexion   Right rotation   Left rotation    (Blank rows = not tested)  LOWER EXTREMITY MMT:    MMT Right eval Left eval Right/ Left  08/03/23  Hip flexion 4 4   Hip extension     Hip abduction 4 4 4+ / 4+  Hip adduction     Hip internal rotation     Hip external rotation     Knee flexion     Knee extension     Ankle dorsiflexion     Ankle plantarflexion     Ankle inversion     Ankle eversion      (Blank rows = not tested)  LUMBAR SPECIAL TESTS:  Straight leg  raise test: Positive and Slump test: Positive  FUNCTIONAL TESTS:  30 Second Sit to Stand: 6 reps with UE 08/03/23: 30 Second Sit to Stand: 11 reps without UE   GAIT: Distance walked: 49ft Assistive device utilized: None Level of assistance: SBA Comments: trunk flexed, decreased gait speed  TREATMENT:  [facility] Adult PT Treatment:                                                DATE: 08/03/23 Therapeutic Exercise: Nustep Review of HEP Goal check    [facility] Adult PT Treatment:  DATE: 08/02/23 Nustep L5 LE only x 5 min for functional activity tolerance  Supine PPT x 10 - 5" hold Supine PPT with ball 2x10 Hooklying clamshell with PPT 2x15 black band Supine SLR 2x15 2.5# each 90/90 hold 2x20" Modified thomas stretch x 90" each Bridge with feet on physioball 2x10 Supine h/s curl with physioball 2x15  Omega leg press 3x10 55#  [facility] Adult PT Treatment:                                                DATE: 08/01/23 Nustep L5 LE only x 5 min for functional activity tolerance  Supine PPT x 10 - 5" hold Supine PPT with ball 2x10 Hooklying clamshell with PPT 2x15 blue band Supine SLR 2x15 2# each Bridge with feet on physioball 2x10 Supine h/s curl with physioball 2x15  90/90 hold 2x20" Modified thomas stretch x 90" each STS no UE 2x10-elevated mat table  [facility] Adult PT Treatment:                                                DATE: 07/27/23 Nustep L5 LE only x 10 min for functional activity tolerance  Seated PPT -with clams and marching  Supine bridge with feet on ball Supine h/s curls feet on ball  Supine mini crunch with ball-using left arm only due to right shoulder pain Supine PPT x 10 - 5" hold Supine PPT with ball 2x10 Supine SLR 1x10 2# Hooklying clamshell 3x15 with ab brace blue  S/L clam blue band right to fatigue  Modified thomas stretch x 90" each STS no UE 2x10-elevated mat table    [facility] Adult PT Treatment:                                                 DATE: 07/25/23 Nustep L5 LE only x 5 min for functional activity tolerance Supine PPT x 10 - 5" hold Supine PPT with ball 2x10 Supine SLR 3x10 Hooklying clamshell 3x15 with ab brace Bridge (small range) 3x10 Modified thomas stretch x 90" each STS no UE 2x10      PATIENT EDUCATION:  Education details: eval findings, ODI, HEP, POC Person educated: Patient Education method: Explanation, Demonstration, and Handouts Education comprehension: verbalized understanding and returned demonstration  HOME EXERCISE PROGRAM: Access Code: [email or web address] URL: [email or web address] Date: 06/27/2023 Prepared by: Loral Roch  Exercises - Modified Andy Bannister Stretch  - 1 x daily - 7 x weekly - 2 reps - 60 sec hold - Supine Posterior Pelvic Tilt  - 1 x daily - 7 x weekly - 2 sets - 10 reps - 5 sec hold - Supine Bridge  - 1 x daily - 7 x weekly - 2 sets - 10 reps - Supine Lower Trunk Rotation  - 1 x daily - 7 x weekly - 2 sets - 10 reps - Seated Sciatic Tensioner  - 1 x daily - 7 x weekly - 2 sets - 15 reps 07/13/23 - Seated Pelvic Tilt  - 1 x daily - 7 x weekly - 2 sets - 10 reps - Seated  March  - 1 x daily - 7 x weekly - 2 sets - 10 reps - seated clam with resistance band  - 1 x daily - 7 x weekly - 2 sets - 10 reps - Sit to stand with control  - 1 x daily - 7 x weekly - 2 sets - 10 reps - Seated Flexion Stretch  - 1 x daily - 7 x weekly - 1 sets - 10 reps - 5-10 hold  ASSESSMENT:  CLINICAL IMPRESSION: Pt was able to complete prescribed exercises with improved tolerance. Today we focused on review of HEP, goal check and discharge. Pt has upcoming shoulder surgery on June 11 th. She has mad some gains in her activity tolerance and is independent with her HEP. Her pain is now intermittent rather than constant however pain intensity can still be high.    EVAL: Patient is a 48 y.o. F who was seen today for physical therapy evaluation and treatment for chronic LBP that  refers into bilateral posterior LE. Physical findings are consistent with referring provider impression as pt demonstrates decrease in hip/core strength and general functional mobility. ODI score shows severe disability in performance of home ADLs and community activities. Pt would benefit from skilled PT services working on improving strength and activity tolerance in order to decrease back pain and improve function and standing time.    OBJECTIVE IMPAIRMENTS: Abnormal gait, decreased activity tolerance, decreased balance, decreased endurance, decreased mobility, difficulty walking, decreased ROM, decreased strength, postural dysfunction, and pain  ACTIVITY LIMITATIONS: carrying, lifting, bending, sitting, standing, squatting, stairs, and transfers  PARTICIPATION LIMITATIONS: meal prep, cleaning, driving, shopping, community activity, occupation, and yard work  PERSONAL FACTORS: Time since onset of injury/illness/exacerbation and 1-2 comorbidities: DM, Bipolar disorder are also affecting patient's functional outcome.   REHAB POTENTIAL: Good  CLINICAL DECISION MAKING: Evolving/moderate complexity  EVALUATION COMPLEXITY: Moderate   GOALS: Goals reviewed with patient? No  SHORT TERM GOALS: Target date: 07/18/2023   Pt will be compliant and knowledgeable with initial HEP for improved comfort and carryover Baseline: initial HEP given  08/03/23: demonstrates Goal status: MET  2.  Pt will self report low back and LE pain no greater than 7/10 for improved comfort and functional ability Baseline: 10/10 at worst 07/20/23: 7/10 recently  08/03/23: more intermittent but sharper intensity up to 10/10 Goal status: NOT MET  LONG TERM GOALS: Target date: 08/22/2023   Pt will be decrease ODI disability score to no greater than 25% (12/50) as proxy for functional improvement Baseline: 40% disability (20/50) 08/03/23: 36%  Goal status: NOT MET   2.  Pt will self report low back and LE pain no greater  than 3/10 for improved comfort and functional ability Baseline: 10/10 at worst Goal status: NOT MET  3.  Pt will increase 30 Second Sit to Stand rep count to no less than 10 reps for improved balance, strength, and functional mobility Baseline: 6 reps with UE 08/03/23: 11 reps Goal status: MET   4.  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  Pt will improve all LE MMT to no less than 4+/5 for improved comfort and functional mobility Baseline: see MMT chart 5/300/25: hip abdct 4+/5 bilat  Goal status: MET  PLAN:  PT FREQUENCY: 1-2x/week  PT DURATION: 8 weeks  PLANNED INTERVENTIONS: 97164- PT Re-evaluation, 97110-Therapeutic exercises, 97530- Therapeutic activity, [id number]- Neuromuscular re-education, 97535- Self Care, 14782- Manual therapy, [phone]- Gait training, [phone]- Aquatic Therapy, [phone]- Electrical stimulation (unattended), [phone]- Electrical stimulation (manual), Dry Needling, Cryotherapy, and Moist heat  PLAN FOR NEXT SESSION: N/A DC to HEP    Gasper Karst, PTA 08/03/23 10:16 AM Phone: [phone] Fax: [phone]

## 2023-08-06 ENCOUNTER — Ambulatory Visit: Admitting: Student

## 2023-08-06 ENCOUNTER — Ambulatory Visit

## 2023-08-07 ENCOUNTER — Encounter: Admitting: Physical Therapy

## 2023-08-07 ENCOUNTER — Telehealth: Payer: Self-pay

## 2023-08-07 ENCOUNTER — Encounter: Payer: Self-pay | Admitting: Student

## 2023-08-07 ENCOUNTER — Other Ambulatory Visit (HOSPITAL_COMMUNITY): Payer: Self-pay

## 2023-08-07 ENCOUNTER — Ambulatory Visit (INDEPENDENT_AMBULATORY_CARE_PROVIDER_SITE_OTHER): Admitting: Student

## 2023-08-07 VITALS — BP 122/74 | HR 96 | Ht 68.0 in | Wt 304.6 lb

## 2023-08-07 DIAGNOSIS — H6993 Unspecified Eustachian tube disorder, bilateral: Secondary | ICD-10-CM | POA: Diagnosis present

## 2023-08-07 NOTE — Patient Instructions (Signed)
 Eustachian Tube Dysfunction  Eustachian tube dysfunction refers to a condition in which a blockage develops in the narrow passage that connects the middle ear to the back of the nose (eustachian tube). The eustachian tube regulates air pressure in the middle ear by letting air move between the ear and nose. It also helps to drain fluid from the middle ear space. Eustachian tube dysfunction can affect one or both ears. When the eustachian tube does not function properly, air pressure, fluid, or both can build up in the middle ear. What are the causes? This condition occurs when the eustachian tube becomes blocked or cannot open normally. Common causes of this condition include: Ear infections. Colds and other infections that affect the nose, mouth, and throat (upper respiratory tract). Allergies. Irritation from cigarette smoke. Irritation from stomach acid coming up into the esophagus (gastroesophageal reflux). The esophagus is the part of the body that moves food from the mouth to the stomach. Sudden changes in air pressure, such as from descending in an airplane or scuba diving. Abnormal growths in the nose or throat, such as: Growths that line the nose (nasal polyps). Abnormal growth of cells (tumors). Enlarged tissue at the back of the throat (adenoids). What increases the risk? You are more likely to develop this condition if: You smoke. You are overweight. You are a child who has: Certain birth defects of the mouth, such as cleft palate. Large tonsils or adenoids. What are the signs or symptoms? Common symptoms of this condition include: A feeling of fullness in the ear. Ear pain. Clicking or popping noises in the ear. Ringing in the ear (tinnitus). Hearing loss. Loss of balance. Dizziness. Symptoms may get worse when the air pressure around you changes, such as when you travel to an area of high elevation, fly on an airplane, or go scuba diving. How is this diagnosed? This  condition may be diagnosed based on: Your symptoms. A physical exam of your ears, nose, and throat. Tests, such as those that measure: The movement of your eardrum. Your hearing (audiometry). How is this treated? Treatment depends on the cause and severity of your condition. In mild cases, you may relieve your symptoms by moving air into your ears. This is called "popping the ears." In more severe cases, or if you have symptoms of fluid in your ears, treatment may include: Medicines to relieve congestion (decongestants). Medicines that treat allergies (antihistamines). Nasal sprays or ear drops that contain medicines that reduce swelling (steroids). A procedure to drain the fluid in your eardrum. In this procedure, a small tube may be placed in the eardrum to: Drain the fluid. Restore the air in the middle ear space. A procedure to insert a balloon device through the nose to inflate the opening of the eustachian tube (balloon dilation). Follow these instructions at home: Lifestyle Do not do any of the following until your health care provider approves: Travel to high altitudes. Fly in airplanes. Work in a Estate agent or room. Scuba dive. Do not use any products that contain nicotine or tobacco. These products include cigarettes, chewing tobacco, and vaping devices, such as e-cigarettes. If you need help quitting, ask your health care provider. Keep your ears dry. Wear fitted earplugs during showering and bathing. Dry your ears completely after. General instructions Take over-the-counter and prescription medicines only as told by your health care provider. Use techniques to help pop your ears as recommended by your health care provider. These may include: Chewing gum. Yawning. Frequent, forceful swallowing.  Closing your mouth, holding your nose closed, and gently blowing as if you are trying to blow air out of your nose. Keep all follow-up visits. This is important. Contact a  health care provider if: Your symptoms do not go away after treatment. Your symptoms come back after treatment. You are unable to pop your ears. You have: A fever. Pain in your ear. Pain in your head or neck. Fluid draining from your ear. Your hearing suddenly changes. You become very dizzy. You lose your balance. Get help right away if: You have a sudden, severe increase in any of your symptoms. Summary Eustachian tube dysfunction refers to a condition in which a blockage develops in the eustachian tube. It can be caused by ear infections, allergies, inhaled irritants, or abnormal growths in the nose or throat. Symptoms may include ear pain or fullness, hearing loss, or ringing in the ears. Mild cases are treated with techniques to unblock the ears, such as yawning or chewing gum. More severe cases are treated with medicines or procedures. This information is not intended to replace advice given to you by your health care provider. Make sure you discuss any questions you have with your health care provider. Document Revised: 05/03/2020 Document Reviewed: 05/03/2020 Elsevier Patient Education  2024 [employer].

## 2023-08-07 NOTE — Telephone Encounter (Signed)
 Received message from patient that [place] 3 sensors are requiring prior authorization.   Submitted PA via Covermymeds.   Key: [id number]  Will await response.   Elsie Halo, RN

## 2023-08-07 NOTE — Progress Notes (Signed)
    SUBJECTIVE:   CHIEF COMPLAINT / HPI:   [name] [name] is a 48 year old female who presents with a sensation of clogged ears following a recent cold.  She experiences a sensation of clogged ears, which began after a recent cold. Initially, there was mucus discharge from one side, which then started on the other side over the weekend. 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 There is no tinnitus. Congestion is primarily on the right side, which is tender to touch.  She also experiences soreness in her neck, which she attributes to the use of an apnea treatment mask. Adjusting the mask herself has provided some relief. She has not taken any pain medication yet.  PERTINENT  PMH / PSH: Reviewed  OBJECTIVE:   BP 122/74   Pulse 96   Ht 5\' 8"  (1.727 m)   Wt (!) 304 lb 9.6 oz (138.2 kg)   SpO2 98%   BMI 46.31 kg/m    Physical Exam General: Alert, well appearing, NAD Ear: Normal TM bilaterally Cardiovascular: RRR, No Murmurs, Normal S2/S2 Respiratory: CTAB, No wheezing or Rales MSK: Mild tenderness at the top of the middle back.  No notable deformity  ASSESSMENT/PLAN:   Eustachian Tube Dysfunction Suspected dysfunction due to congestion post-upper respiratory infection, causing ear fullness. - Provided saline nasal irrigation samples. - Instructed to use nasal irrigation as needed, especially in the first two days, directing spray towards the outside of the nose.  Neck Pain Muscular pain likely from CPAP mask use for obstructive sleep apnea. Adjusting the mask provided some relief. - Advised to contact CPAP provider for mask adjustments. - Recommended acetaminophen  for pain management. - Suggested lidocaine  patch needed.  Goble Last, MD [facility]

## 2023-08-07 NOTE — Telephone Encounter (Signed)
 Received approval from insurance for [place] 3 sensors.   Approval from 08/07/23- 02/06/24.  Called pharmacy with approval. Rx ran through insurance for $4.   Mychart message sent to patient with update.   Elsie Halo, RN

## 2023-08-08 ENCOUNTER — Other Ambulatory Visit: Payer: Self-pay

## 2023-08-08 ENCOUNTER — Encounter (HOSPITAL_BASED_OUTPATIENT_CLINIC_OR_DEPARTMENT_OTHER): Payer: Self-pay | Admitting: Orthopaedic Surgery

## 2023-08-08 NOTE — Progress Notes (Signed)
   08/08/23 1647  Pre-op Phone Call  Surgery Date Verified 08/15/23  Arrival Time Verified 1145  Surgery Location Verified [facility]   Medical History Reviewed Yes  Is the patient taking a GLP-1 receptor agonist? Yes  Has the patient been informed on holding medication? Yes  Does the patient have diabetes? Type II  Does the patient use a Continuous Blood Glucose Monitor? Yes (hasn't put it on yet... wants to wait until after surgery)  Is the patient on an insulin  pump? No  Has the diabetes coordinator been notified? No  Do you have a history of heart problems? No  Does patient have other implanted devices? No  Patient Teaching Pre / Post Procedure  Patient educated about smoking cessation 24 hours prior to surgery. N/A Non-Smoker  Patient verbalizes understanding of bowel prep? N/A  Med Rec Completed Yes  Take the Following Meds the Morning of Surgery no meds DOS; takes meds at night; hold GLP until after surgery  Recent  Lab Work, EKG, CXR? No  NPO (Including gum & candy) After midnight  Patient instructed to stop clear liquids including Carb loading drink at: 0945  Stop Solids, Milk, Candy, and Gum STARTING AT MIDNIGHT  Responsible adult to drive and be with you for 24 hours? Yes  Name & Phone Number for Ride/Caregiver sister or friend (will have confirmed before day of surgery)  No Jewelry, money, nail polish or make-up.  No lotions, powders, perfumes. No shaving  48 hrs. prior to surgery. Yes  Contacts, Dentures & Glasses Will Have to be Removed Before OR. Yes  Please bring your ID and Insurance Card the morning of your surgery. (Surgery Centers Only) Yes  Bring any papers or x-rays with you that your surgeon gave you. Yes  Instructed to contact the location of procedure/ provider if they or anyone in their household develops symptoms or tests positive for COVID-19, has close contact with someone who tests positive for COVID, or has known exposure to any contagious illness. Yes  Call  this number the morning of surgery  with any problems that may cancel your surgery. [phone]  Covid-19 Assessment  Have you had a positive COVID-19 test within the previous 90 days? No  COVID Testing Guidance Proceed with the additional questions.  Patient's surgery required a COVID-19 test (cardiothoracic, complex ENT, and bronchoscopies/ EBUS) No  Have you been unmasked and in close contact with anyone with COVID-19 or COVID-19 symptoms within the past 10 days? No  Do you or anyone in your household currently have any COVID-19 symptoms? No

## 2023-08-14 ENCOUNTER — Encounter (HOSPITAL_BASED_OUTPATIENT_CLINIC_OR_DEPARTMENT_OTHER)
Admission: RE | Admit: 2023-08-14 | Discharge: 2023-08-14 | Disposition: A | Discharge status: Home / Self Care | Source: Ambulatory Visit | Attending: Orthopaedic Surgery | Admitting: Orthopaedic Surgery

## 2023-08-14 ENCOUNTER — Encounter: Payer: Self-pay | Admitting: *Deleted

## 2023-08-14 DIAGNOSIS — Z6841 Body Mass Index (BMI) 40.0 and over, adult: Secondary | ICD-10-CM | POA: Diagnosis not present

## 2023-08-14 DIAGNOSIS — E119 Type 2 diabetes mellitus without complications: Secondary | ICD-10-CM | POA: Diagnosis not present

## 2023-08-14 DIAGNOSIS — M7521 Bicipital tendinitis, right shoulder: Secondary | ICD-10-CM | POA: Diagnosis not present

## 2023-08-14 DIAGNOSIS — M75111 Incomplete rotator cuff tear or rupture of right shoulder, not specified as traumatic: Secondary | ICD-10-CM | POA: Diagnosis present

## 2023-08-14 DIAGNOSIS — M25811 Other specified joint disorders, right shoulder: Secondary | ICD-10-CM | POA: Diagnosis not present

## 2023-08-14 DIAGNOSIS — I10 Essential (primary) hypertension: Secondary | ICD-10-CM | POA: Diagnosis not present

## 2023-08-14 DIAGNOSIS — E66813 Obesity, class 3: Secondary | ICD-10-CM | POA: Diagnosis not present

## 2023-08-14 DIAGNOSIS — G4730 Sleep apnea, unspecified: Secondary | ICD-10-CM | POA: Diagnosis not present

## 2023-08-14 DIAGNOSIS — K219 Gastro-esophageal reflux disease without esophagitis: Secondary | ICD-10-CM | POA: Diagnosis not present

## 2023-08-14 DIAGNOSIS — Z7984 Long term (current) use of oral hypoglycemic drugs: Secondary | ICD-10-CM | POA: Diagnosis not present

## 2023-08-14 LAB — BASIC METABOLIC PANEL WITH GFR
Anion gap: 9 (ref 5–15)
BUN: 9 mg/dL (ref 6–20)
CO2: 26 mmol/L (ref 22–32)
Calcium: 9.3 mg/dL (ref 8.9–10.3)
Chloride: 104 mmol/L (ref 98–111)
Creatinine, Ser: 0.55 mg/dL (ref 0.44–1.00)
GFR, Estimated: >60 mL/min (ref >60)
Glucose, Bld: 183 mg/dL — ABNORMAL HIGH (ref 70–99)
Potassium: 3.9 mmol/L (ref 3.5–5.1)
Sodium: 139 mmol/L (ref 135–145)

## 2023-08-14 NOTE — Progress Notes (Signed)

## 2023-08-15 ENCOUNTER — Encounter (HOSPITAL_BASED_OUTPATIENT_CLINIC_OR_DEPARTMENT_OTHER)
Admission: RE | Disposition: A | Discharge status: Home / Self Care | Payer: Self-pay | Source: Home / Self Care | Attending: Orthopaedic Surgery

## 2023-08-15 ENCOUNTER — Ambulatory Visit (HOSPITAL_BASED_OUTPATIENT_CLINIC_OR_DEPARTMENT_OTHER): Admitting: Anesthesiology

## 2023-08-15 ENCOUNTER — Other Ambulatory Visit (HOSPITAL_COMMUNITY): Payer: Self-pay

## 2023-08-15 ENCOUNTER — Encounter: Admitting: Gastroenterology

## 2023-08-15 ENCOUNTER — Encounter (HOSPITAL_BASED_OUTPATIENT_CLINIC_OR_DEPARTMENT_OTHER): Payer: Self-pay | Admitting: Orthopaedic Surgery

## 2023-08-15 ENCOUNTER — Encounter: Payer: Self-pay | Admitting: Orthopaedic Surgery

## 2023-08-15 ENCOUNTER — Encounter: Admitting: Physician Assistant

## 2023-08-15 ENCOUNTER — Ambulatory Visit (HOSPITAL_BASED_OUTPATIENT_CLINIC_OR_DEPARTMENT_OTHER)
Admission: RE | Admit: 2023-08-15 | Discharge: 2023-08-15 | Disposition: A | Discharge status: Home / Self Care | Attending: Orthopaedic Surgery | Admitting: Orthopaedic Surgery

## 2023-08-15 DIAGNOSIS — K219 Gastro-esophageal reflux disease without esophagitis: Secondary | ICD-10-CM | POA: Insufficient documentation

## 2023-08-15 DIAGNOSIS — I10 Essential (primary) hypertension: Secondary | ICD-10-CM | POA: Insufficient documentation

## 2023-08-15 DIAGNOSIS — M75111 Incomplete rotator cuff tear or rupture of right shoulder, not specified as traumatic: Secondary | ICD-10-CM

## 2023-08-15 DIAGNOSIS — M7541 Impingement syndrome of right shoulder: Secondary | ICD-10-CM | POA: Diagnosis not present

## 2023-08-15 DIAGNOSIS — M25811 Other specified joint disorders, right shoulder: Secondary | ICD-10-CM | POA: Insufficient documentation

## 2023-08-15 DIAGNOSIS — G4730 Sleep apnea, unspecified: Secondary | ICD-10-CM | POA: Diagnosis not present

## 2023-08-15 DIAGNOSIS — M7521 Bicipital tendinitis, right shoulder: Secondary | ICD-10-CM | POA: Insufficient documentation

## 2023-08-15 DIAGNOSIS — Z6841 Body Mass Index (BMI) 40.0 and over, adult: Secondary | ICD-10-CM | POA: Insufficient documentation

## 2023-08-15 DIAGNOSIS — Z419 Encounter for procedure for purposes other than remedying health state, unspecified: Secondary | ICD-10-CM

## 2023-08-15 DIAGNOSIS — Z7984 Long term (current) use of oral hypoglycemic drugs: Secondary | ICD-10-CM | POA: Insufficient documentation

## 2023-08-15 DIAGNOSIS — E119 Type 2 diabetes mellitus without complications: Secondary | ICD-10-CM | POA: Insufficient documentation

## 2023-08-15 DIAGNOSIS — E66813 Obesity, class 3: Secondary | ICD-10-CM | POA: Insufficient documentation

## 2023-08-15 HISTORY — PX: POSTERIOR LUMBAR FUSION 2 WITH HARDWARE REMOVAL: SHX7297

## 2023-08-15 HISTORY — DX: Peripheral vascular disease, unspecified: I73.9

## 2023-08-15 LAB — GLUCOSE, CAPILLARY
Glucose-Capillary: 120 mg/dL — ABNORMAL HIGH (ref 70–99)
Glucose-Capillary: 133 mg/dL — ABNORMAL HIGH (ref 70–99)

## 2023-08-15 SURGERY — ARTHROSCOPY, SHOULDER WITH DEBRIDEMENT
Anesthesia: General | Site: Shoulder | Laterality: Right

## 2023-08-15 MED ORDER — SUCCINYLCHOLINE CHLORIDE 200 MG/10ML IV SOSY
PREFILLED_SYRINGE | INTRAVENOUS | Status: DC | PRN
  Administered 2023-08-15: 120 mg via INTRAVENOUS

## 2023-08-15 MED ORDER — AMISULPRIDE (ANTIEMETIC) 5 MG/2ML IV SOLN: 10.0000 mg | Freq: Once | INTRAVENOUS | Status: DC

## 2023-08-15 MED ORDER — LACTATED RINGERS IV SOLN: INTRAVENOUS | Status: DC

## 2023-08-15 MED ORDER — SODIUM CHLORIDE 0.9 % IV SOLN
12.5000 mg | INTRAVENOUS | Status: DC | PRN
  Filled 2023-08-15: qty 0.5

## 2023-08-15 MED ORDER — ONDANSETRON HCL 4 MG/2ML IJ SOLN
INTRAMUSCULAR | Status: AC
  Filled 2023-08-15: qty 2

## 2023-08-15 MED ORDER — FENTANYL CITRATE (PF) 100 MCG/2ML IJ SOLN: 25.0000 ug | INTRAMUSCULAR | Status: DC | PRN

## 2023-08-15 MED ORDER — ACETAMINOPHEN 500 MG PO TABS
1000.0000 mg | ORAL_TABLET | Freq: Once | ORAL | Status: AC
  Administered 2023-08-15: 1000 mg via ORAL

## 2023-08-15 MED ORDER — AMISULPRIDE (ANTIEMETIC) 5 MG/2ML IV SOLN
INTRAVENOUS | Status: AC
  Filled 2023-08-15: qty 4

## 2023-08-15 MED ORDER — ACETAMINOPHEN-CODEINE 300-30 MG PO TABS
1.0000 | ORAL_TABLET | Freq: Every day | ORAL | 0 refills | Status: AC | PRN
  Filled 2023-08-15: qty 10, 5d supply, fill #0

## 2023-08-15 MED ORDER — LIDOCAINE 2% (20 MG/ML) 5 ML SYRINGE
INTRAMUSCULAR | Status: DC | PRN
  Administered 2023-08-15: 60 mg via INTRAVENOUS

## 2023-08-15 MED ORDER — FENTANYL CITRATE (PF) 100 MCG/2ML IJ SOLN
INTRAMUSCULAR | Status: AC
Start: 2023-08-15 — End: 2023-08-15
  Filled 2023-08-15: qty 2

## 2023-08-15 MED ORDER — OXYCODONE HCL 5 MG PO TABS: 5.0000 mg | ORAL_TABLET | Freq: Once | ORAL | Status: DC | PRN

## 2023-08-15 MED ORDER — SUCCINYLCHOLINE CHLORIDE 200 MG/10ML IV SOSY
PREFILLED_SYRINGE | INTRAVENOUS | Status: AC
  Filled 2023-08-15: qty 10

## 2023-08-15 MED ORDER — SODIUM CHLORIDE 0.9 % IR SOLN
Status: DC | PRN
  Administered 2023-08-15: 6000 mL

## 2023-08-15 MED ORDER — BUPIVACAINE HCL (PF) 0.5 % IJ SOLN
INTRAMUSCULAR | Status: DC | PRN
  Administered 2023-08-15: 20 mL via PERINEURAL

## 2023-08-15 MED ORDER — PHENYLEPHRINE HCL-NACL 20-0.9 MG/250ML-% IV SOLN
INTRAVENOUS | Status: DC | PRN
  Administered 2023-08-15: 60 ug/min via INTRAVENOUS

## 2023-08-15 MED ORDER — DEXAMETHASONE SODIUM PHOSPHATE 10 MG/ML IJ SOLN
INTRAMUSCULAR | Status: DC | PRN
  Administered 2023-08-15: 4 mg via INTRAVENOUS

## 2023-08-15 MED ORDER — DEXAMETHASONE SODIUM PHOSPHATE 10 MG/ML IJ SOLN
INTRAMUSCULAR | Status: AC
  Filled 2023-08-15: qty 1

## 2023-08-15 MED ORDER — FENTANYL CITRATE (PF) 100 MCG/2ML IJ SOLN
100.0000 ug | Freq: Once | INTRAMUSCULAR | Status: AC
  Administered 2023-08-15: 100 ug via INTRAVENOUS

## 2023-08-15 MED ORDER — OXYCODONE HCL 5 MG/5ML PO SOLN: 5.0000 mg | Freq: Once | ORAL | Status: DC | PRN

## 2023-08-15 MED ORDER — PHENYLEPHRINE 80 MCG/ML (10ML) SYRINGE FOR IV PUSH (FOR BLOOD PRESSURE SUPPORT)
PREFILLED_SYRINGE | INTRAVENOUS | Status: DC | PRN
  Administered 2023-08-15: 160 ug via INTRAVENOUS

## 2023-08-15 MED ORDER — PHENYLEPHRINE HCL-NACL 20-0.9 MG/250ML-% IV SOLN
INTRAVENOUS | Status: AC
  Filled 2023-08-15: qty 250

## 2023-08-15 MED ORDER — ONDANSETRON HCL 4 MG/2ML IJ SOLN
INTRAMUSCULAR | Status: DC | PRN
  Administered 2023-08-15: 4 mg via INTRAVENOUS

## 2023-08-15 MED ORDER — MIDAZOLAM HCL 2 MG/2ML IJ SOLN
INTRAMUSCULAR | Status: AC
Start: 2023-08-15 — End: 2023-08-15
  Filled 2023-08-15: qty 2

## 2023-08-15 MED ORDER — PHENYLEPHRINE 80 MCG/ML (10ML) SYRINGE FOR IV PUSH (FOR BLOOD PRESSURE SUPPORT)
PREFILLED_SYRINGE | INTRAVENOUS | Status: AC
  Filled 2023-08-15: qty 10

## 2023-08-15 MED ORDER — LIDOCAINE 2% (20 MG/ML) 5 ML SYRINGE
INTRAMUSCULAR | Status: AC
  Filled 2023-08-15: qty 5

## 2023-08-15 MED ORDER — LACTATED RINGERS IV SOLN: INTRAVENOUS | Status: DC | PRN

## 2023-08-15 MED ORDER — BUPIVACAINE LIPOSOME 1.3 % IJ SUSP
INTRAMUSCULAR | Status: DC | PRN
  Administered 2023-08-15: 10 mL via PERINEURAL

## 2023-08-15 MED ORDER — MIDAZOLAM HCL 2 MG/2ML IJ SOLN
2.0000 mg | Freq: Once | INTRAMUSCULAR | Status: AC
  Administered 2023-08-15: 2 mg via INTRAVENOUS

## 2023-08-15 MED ORDER — PROPOFOL 10 MG/ML IV BOLUS
INTRAVENOUS | Status: DC | PRN
  Administered 2023-08-15: 200 mg via INTRAVENOUS

## 2023-08-15 MED ORDER — AMISULPRIDE (ANTIEMETIC) 5 MG/2ML IV SOLN
10.0000 mg | Freq: Once | INTRAVENOUS | Status: AC | PRN
  Administered 2023-08-15: 10 mg via INTRAVENOUS

## 2023-08-15 MED ORDER — CEFAZOLIN SODIUM-DEXTROSE 3-4 GM/150ML-% IV SOLN
INTRAVENOUS | Status: AC
  Filled 2023-08-15: qty 150

## 2023-08-15 MED ORDER — CEFAZOLIN SODIUM-DEXTROSE 3-4 GM/150ML-% IV SOLN
3.0000 g | INTRAVENOUS | Status: AC
  Administered 2023-08-15: 3 g via INTRAVENOUS

## 2023-08-15 SURGICAL SUPPLY — 50 items
BLADE EXCALIBUR 4.0X13 (MISCELLANEOUS) IMPLANT
BURR OVAL 8 FLU 4.0X13 (MISCELLANEOUS) ×1 IMPLANT
CANNULA 5.75X71 LONG (CANNULA) ×1 IMPLANT
CANNULA SHOULDER 7CM (CANNULA) IMPLANT
CANNULA TWIST IN 8.25X7CM (CANNULA) IMPLANT
COOLER ICEMAN CLASSIC (MISCELLANEOUS) ×1 IMPLANT
DISSECTOR 3.8MM X 13CM (MISCELLANEOUS) ×1 IMPLANT
DRAPE IMP U-DRAPE 54X76 (DRAPES) ×1 IMPLANT
DRAPE INCISE IOBAN 66X45 STRL (DRAPES) ×1 IMPLANT
DRAPE STERI 35X30 U-POUCH (DRAPES) ×1 IMPLANT
DRAPE U-SHAPE 47X51 STRL (DRAPES) ×2 IMPLANT
DRAPE U-SHAPE 76X120 STRL (DRAPES) ×2 IMPLANT
DURAPREP 26ML APPLICATOR (WOUND CARE) ×2 IMPLANT
ELECTRODE REM PT RTRN 9FT ADLT (ELECTROSURGICAL) IMPLANT
GAUZE PAD ABD 8X10 STRL (GAUZE/BANDAGES/DRESSINGS) ×2 IMPLANT
GAUZE SPONGE 4X4 12PLY STRL (GAUZE/BANDAGES/DRESSINGS) ×2 IMPLANT
GAUZE XEROFORM 1X8 LF (GAUZE/BANDAGES/DRESSINGS) ×1 IMPLANT
GLOVE BIO SURGEON STRL SZ7 (GLOVE) IMPLANT
GLOVE BIOGEL PI IND STRL 7.0 (GLOVE) IMPLANT
GLOVE BIOGEL PI IND STRL 7.5 (GLOVE) ×1 IMPLANT
GLOVE ECLIPSE 7.0 STRL STRAW (GLOVE) ×2 IMPLANT
GLOVE INDICATOR 7.0 STRL GRN (GLOVE) ×1 IMPLANT
GLOVE SURG SS PI 7.0 STRL IVOR (GLOVE) IMPLANT
GLOVE SURG SYN 7.5 E (GLOVE) ×2 IMPLANT
GLOVE SURG SYN 7.5 PF PI (GLOVE) ×2 IMPLANT
GOWN STRL REUS W/ TWL LRG LVL3 (GOWN DISPOSABLE) ×1 IMPLANT
GOWN STRL REUS W/ TWL XL LVL3 (GOWN DISPOSABLE) IMPLANT
GOWN STRL SURGICAL XL XLNG (GOWN DISPOSABLE) ×2 IMPLANT
MANIFOLD NEPTUNE II (INSTRUMENTS) ×1 IMPLANT
NDL HD SCORPION MEGA LOADER (NEEDLE) IMPLANT
NDL SPNL 22GX7 QUINCKE BK (NEEDLE) IMPLANT
NEEDLE SPNL 22GX7 QUINCKE BK (NEEDLE) IMPLANT
PACK ARTHROSCOPY DSU (CUSTOM PROCEDURE TRAY) ×1 IMPLANT
PACK BASIN DAY SURGERY FS (CUSTOM PROCEDURE TRAY) ×1 IMPLANT
PAD COLD SHLDR WRAP-ON (PAD) ×1 IMPLANT
SHEET MEDIUM DRAPE 40X70 STRL (DRAPES) ×1 IMPLANT
SLEEVE SCD COMPRESS KNEE MED (STOCKING) ×1 IMPLANT
SLING ARM FOAM STRAP LRG (SOFTGOODS) IMPLANT
SLING ARM FOAM STRAP XLG (SOFTGOODS) IMPLANT
SPIKE FLUID TRANSFER (MISCELLANEOUS) IMPLANT
SUT ETHILON 3 0 PS 1 (SUTURE) ×1 IMPLANT
SUT TIGER TAPE 7 IN WHITE (SUTURE) IMPLANT
SUTURE FIBERWR #2 38 T-5 BLUE (SUTURE) IMPLANT
SUTURE TAPE 1.3 40 TPR END (SUTURE) IMPLANT
SUTURE TAPE TIGERLINK 1.3MM BL (SUTURE) IMPLANT
TAPE FIBER 2MM 7IN #2 BLUE (SUTURE) IMPLANT
TOWEL GREEN STERILE FF (TOWEL DISPOSABLE) ×1 IMPLANT
TUBE CONNECTING 20X1/4 (TUBING) ×1 IMPLANT
TUBING ARTHROSCOPY IRRIG 16FT (MISCELLANEOUS) ×1 IMPLANT
WAND ABLATOR APOLLO I90 (BUR) ×1 IMPLANT

## 2023-08-15 NOTE — Discharge Instructions (Addendum)
 Post-operative patient instructions  Shoulder Arthroscopy   Ice:  Place intermittent ice or cooler pack over your shoulder, 30 minutes on and 30 minutes off.  Continue this for the first 72 hours after surgery, then save ice for use after therapy sessions or on more active days.   Weight:  You may bear weight on your arm as your symptoms allow. Motion:  Perform gentle shoulder motion as tolerated Dressing:  Perform 1st dressing change at 2 days postoperative. A moderate amount of blood tinged drainage is to be expected.  So if you bleed through the dressing on the first or second day or if you have fevers, it is fine to change the dressing/check the wounds early and redress wound.  If it bleeds through again, or if the incisions are leaking frank blood, please call the office. May change dressing every 1-2 days thereafter to help watch wounds. You may place regular band-aids over the incisions.   Shower:  Light shower is ok after 2 days.  Please take shower, NO bath. Recover with gauze and ace wrap to help keep wounds protected.   Pain medication:  A narcotic pain medication has been prescribed.  Take as directed.  Typically you need narcotic pain medication more regularly during the first 3 to 5 days after surgery.  Decrease your use of the medication as the pain improves.  Narcotics can sometimes cause constipation, even after a few doses.  If you have problems with constipation, you can take an over the counter stool softener or light laxative.  If you have persistent problems, please notify your physician's office. Physical therapy: Additional activity guidelines to be provided by your physician or physical therapist at follow-up visits.  Driving: Do not recommend driving x 2 weeks post surgical, especially if surgery performed on right side. Should not drive while taking narcotic pain medications. It typically takes at least 2 weeks to restore sufficient neuromuscular function for normal  reaction times for driving safety.  Call [phone] for questions or problems. Evenings you will be forwarded to the hospital operator.  Ask for the orthopaedic physician on call. Please call if you experience:    Redness, foul smelling, or persistent drainage from the surgical site  worsening shoulder pain and swelling not responsive to medication  any calf pain and or swelling of the lower leg  temperatures greater than 101.5 F other questions or concerns  Per [facility] clinic policy, our goal is ensure optimal postoperative pain control with a multimodal pain management strategy. For all [facility] patients, our goal is to wean post-operative narcotic medications by 6 weeks post-operatively. If this is not possible due to utilization of pain medication prior to surgery, your [facility] doctor will support your acute post-operative pain control for the first 6 weeks postoperatively, with a plan to transition you back to your primary pain team following that. [name] [name] will work to ensure a Therapist, occupational.  Thank you for allowing us  to be a part of your care.     Post Anesthesia Home Care Instructions  Activity: Get plenty of rest for the remainder of the day. A responsible individual must stay with you for 24 hours following the procedure.  For the next 24 hours, DO NOT: -Drive a car -Advertising copywriter -Drink alcoholic beverages -Take any medication unless instructed by your physician -Make any legal decisions or sign important papers.  Meals: Start with liquid foods such as gelatin or soup. Progress to regular foods as tolerated. Avoid greasy, spicy,  heavy foods. If nausea and/or vomiting occur, drink only clear liquids until the nausea and/or vomiting subsides. Call your physician if vomiting continues.  Special Instructions/Symptoms: Your throat may feel dry or sore from the anesthesia or the breathing tube placed in your throat during surgery. If this causes discomfort, gargle  with warm salt water . The discomfort should disappear within 24 hours.  If you had a scopolamine patch placed behind your ear for the management of post- operative nausea and/or vomiting:  1. The medication in the patch is effective for 72 hours, after which it should be removed.  Wrap patch in a tissue and discard in the trash. Wash hands thoroughly with soap and water . 2. You may remove the patch earlier than 72 hours if you experience unpleasant side effects which may include dry mouth, dizziness or visual disturbances. 3. Avoid touching the patch. Wash your hands with soap and water  after contact with the patch.     Regional Anesthesia Blocks  1. You may not be able to move or feel the blocked extremity after a regional anesthetic block. This may last may last from 3-48 hours after placement, but it will go away. The length of time depends on the medication injected and your individual response to the medication. As the nerves start to wake up, you may experience tingling as the movement and feeling returns to your extremity. If the numbness and inability to move your extremity has not gone away after 48 hours, please call your surgeon.   2. The extremity that is blocked will need to be protected until the numbness is gone and the strength has returned. Because you cannot feel it, you will need to take extra care to avoid injury. Because it may be weak, you may have difficulty moving it or using it. You may not know what position it is in without looking at it while the block is in effect.  3. For blocks in the legs and feet, returning to weight bearing and walking needs to be done carefully. You will need to wait until the numbness is entirely gone and the strength has returned. You should be able to move your leg and foot normally before you try and bear weight or walk. You will need someone to be with you when you first try to ensure you do not fall and possibly risk injury.  4. Bruising  and tenderness at the needle site are common side effects and will resolve in a few days.  5. Persistent numbness or new problems with movement should be communicated to the surgeon or the [facility] [phone] [facility] [phone]).  Information for Discharge Teaching: EXPAREL  (bupivacaine  liposome injectable suspension)   Pain relief is important to your recovery. The goal is to control your pain so you can move easier and return to your normal activities as soon as possible after your procedure. Your physician may use several types of medicines to manage pain, swelling, and more.  Your surgeon or anesthesiologist gave you EXPAREL (bupivacaine ) to help control your pain after surgery.  EXPAREL  is a local anesthetic designed to release slowly over an extended period of time to provide pain relief by numbing the tissue around the surgical site. EXPAREL  is designed to release pain medication over time and can control pain for up to 72 hours. Depending on how you respond to EXPAREL , you may require less pain medication during your recovery. EXPAREL  can help reduce or eliminate the need for opioids during  the first few days after surgery when pain relief is needed the most. EXPAREL  is not an opioid and is not addictive. It does not cause sleepiness or sedation.   Important! A teal colored band has been placed on your arm with the date, time and amount of EXPAREL  you have received. Please leave this armband in place for the full 96 hours following administration, and then you may remove the band. If you return to the hospital for any reason within 96 hours following the administration of EXPAREL , the armband provides important information that your health care providers to know, and alerts them that you have received this anesthetic.    Possible side effects of EXPAREL : Temporary loss of sensation or ability to move in the area where medication was  injected. Nausea, vomiting, constipation Rarely, numbness and tingling in your mouth or lips, lightheadedness, or anxiety may occur. Call your doctor right away if you think you may be experiencing any of these sensations, or if you have other questions regarding possible side effects.  Follow all other discharge instructions given to you by your surgeon or nurse. Eat a healthy diet and drink plenty of water  or other fluids.  Last received tylenol  at 1145am

## 2023-08-15 NOTE — Anesthesia Procedure Notes (Signed)
 Procedure Name: Intubation Date/Time: 08/15/2023 3:13 PM  Performed by: Darcel Early, CRNAPre-anesthesia Checklist: Patient identified, Emergency Drugs available, Suction available and Patient being monitored Patient Re-evaluated:Patient Re-evaluated prior to induction Oxygen Delivery Method: Circle system utilized Preoxygenation: Pre-oxygenation with 100% oxygen Induction Type: IV induction Ventilation: Mask ventilation without difficulty Laryngoscope Size: Mac and 3 Grade View: Grade II Tube type: Oral Tube size: 7.0 mm Number of attempts: 2 Airway Equipment and Method: Stylet Placement Confirmation: ETT inserted through vocal cords under direct vision, positive ETCO2 and breath sounds checked- equal and bilateral Secured at: 22 cm Tube secured with: Tape Dental Injury: Teeth and Oropharynx as per pre-operative assessment

## 2023-08-15 NOTE — Op Note (Signed)
   Date of Surgery: 08/15/2023  INDICATIONS: The patient is a 48 year old female with right shoulder pain that has failed conservative treatment;  The patient did consent to the procedure after discussion of the risks and benefits.  DIAGNOSES: Right shoulder, biceps tendinitis, subacromial impingement, and subscapularis, supraspinatus tendinopathy.  POST-OPERATIVE DIAGNOSIS: same  PROCEDURE: Arthroscopic extensive debridement - 29823 Subdeltoid Bursa, Supraspinatus Tendon, Anterior Labrum, Superior Labrum, Posterior Labrum, and subscapularis, biceps tenotomy Arthroscopic subacromial decompression - 16109   OPERATIVE FINDING: Exam under anesthesia: Normal Articular space: Normal Chondral surfaces: Normal Biceps: medially subluxed, severe tendinosis Subscapularis: Incomplete tear upper border severe tendinosis Supraspinatus: Intact partial articular surface tear <50% Infraspinatus: Intact mild tendinosis  SURGEON: N. Claria Crofts, M.D.  ASSIST: Sharran Decent, PA-C  ANESTHESIA:  general, regional  IV FLUIDS AND URINE: See anesthesia.  ESTIMATED BLOOD LOSS: minimal mL.  IMPLANTS: None  COMPLICATIONS: None.  DESCRIPTION OF PROCEDURE: The patient was brought to the operating room and placed supine on the operating table.  The patient had been signed prior to the procedure and this was documented. The patient had the anesthesia placed by the anesthesiologist.  A time-out was performed to confirm that this was the correct patient, site, side and location. The patient did receive antibiotics prior to the incision and was re-dosed during the procedure as needed at indicated intervals.  The patient was then positioned into the beach chair position with all bony prominences well padded and neutral C spine. The patient had the operative extremity prepped and draped in the standard surgical fashion.    Posterior shoulder arthroscopy portal was created followed by the anterior portal  through the rotator interval and diagnostic shoulder arthroscopy ensued.  Synovitis was seen throughout the shoulder joint.  The articular surface in the joint space were preserved.  The labrum was severely torn in the anterior posterior and superior portions.  The biceps tendon was medially subluxed.  The upper border of the subscap shows severe tendinopathy.  This was gently debrided back to stable margins.  Biceps tenotomy was performed.  The labrum was debrided with a shaver back to stable margins.  The shoulder was flexed forward and the articular surface of the cuff was evaluated.  There was partial thickness tear less than 50%.  This was gently debrided back to stable margins.  Arthroscope was repositioned into the subacromial space.  Additional lateral portal was created.  Subdeltoid and subacromial bursectomy was performed.  A CA ligament was released.  Unfavorable acromial anatomy was encountered.  Subacromial space was pretty tight.  There was only about 4 mm of space.  I felt this predispose to impingement.  Acromioplasty was performed with a bur.  The [facility] joint was unremarkable.  The bursal surface of the superior cuff showed tendinosis but no significant tearing.  Excess fluid was expressed from the shoulder.  Incisions closed with nylon.  Sterile dressings were applied.  Shoulder sling was placed.  Patient tolerated the procedure well had no any complications. Trellis Fries, my PA, was a medical necessity for the entirety of the surgery including opening, closing, limb positioning, retracting, exposing, and repairing.  POSTOPERATIVE PLAN: Patient will be discharged home.  Follow-up in 1 week for suture removal.  N. Claria Crofts, MD [facility] 4:58 PM

## 2023-08-15 NOTE — Transfer of Care (Signed)
 Immediate Anesthesia Transfer of Care Note  Patient: [name] [name]  Procedure(s) Performed: ARTHROSCOPY, SHOULDER WITH DEBRIDEMENT (Right: Shoulder)  Patient Location: PACU  Anesthesia Type:GA combined with regional for post-op pain  Level of Consciousness: awake, alert , oriented, and patient cooperative  Airway & Oxygen Therapy: Patient Spontanous Breathing and Patient connected to face mask oxygen  Post-op Assessment: Report given to RN and Post -op Vital signs reviewed and stable  Post vital signs: Reviewed and stable  Last Vitals:  Vitals Value Taken Time  BP 109/86 08/15/23 1615  Temp    Pulse 105 08/15/23 1622  Resp 27 08/15/23 1622  SpO2 83 % 08/15/23 1622  Vitals shown include unfiled device data.  Last Pain:  Vitals:   08/15/23 1135  TempSrc: Temporal  PainSc: 4          Complications: No notable events documented.

## 2023-08-15 NOTE — Progress Notes (Signed)
 Assisted Dr. Hyacinth Meeker with right, interscalene , ultrasound guided block. Side rails up, monitors on throughout procedure. See vital signs in flow sheet. Tolerated Procedure well.

## 2023-08-15 NOTE — Anesthesia Postprocedure Evaluation (Signed)
 Anesthesia Post Note  Patient: [name] [name]  Procedure(s) Performed: ARTHROSCOPY, SHOULDER WITH DEBRIDEMENT (Right: Shoulder)     Patient location during evaluation: PACU Anesthesia Type: General Level of consciousness: awake and alert Pain management: pain level controlled Vital Signs Assessment: post-procedure vital signs reviewed and stable Respiratory status: spontaneous breathing, nonlabored ventilation and respiratory function stable Cardiovascular status: blood pressure returned to baseline and stable Postop Assessment: no apparent nausea or vomiting Anesthetic complications: no   No notable events documented.  Last Vitals:  Vitals:   08/15/23 1653 08/15/23 1657  BP: 120/73 (P) 121/77  Pulse: 81 74  Resp: (!) 25 (!) 21  Temp:    SpO2: 96% 98%    Last Pain:  Vitals:   08/15/23 1657  TempSrc:   PainSc: 0-No pain                 Earvin Goldberg

## 2023-08-15 NOTE — Anesthesia Procedure Notes (Signed)
 Anesthesia Regional Block: Interscalene brachial plexus block   Pre-Anesthetic Checklist: , timeout performed,  Correct Patient, Correct Site, Correct Laterality,  Correct Procedure, Correct Position, site marked,  Risks and benefits discussed,  Surgical consent,  Pre-op evaluation,  At surgeon's request and post-op pain management  Laterality: Right  Prep: chloraprep       Needles:  Injection technique: Single-shot  Needle Type: Stimiplex     Needle Length: 9cm  Needle Gauge: 21     Additional Needles:   Procedures:,,,, ultrasound used (permanent image in chart),,    Narrative:  Start time: 08/15/2023 12:55 PM End time: 08/15/2023 1:00 PM Injection made incrementally with aspirations every 5 mL.  Performed by: Personally  Anesthesiologist: Earvin Goldberg, MD

## 2023-08-15 NOTE — Progress Notes (Signed)
 Pt complaining of chest tightness on arrival to PACU. Dr Annabell Key aware, pt had chest tightness prior to surgery - exacerbated after surgery. Order obtained from Dr Annabell Key for barhemysis for nausea, EKG order obtained. EKG given to Dr Annabell Key. Chest tightness related to block per Dr Annabell Key. Pt states chest tightness has improved. VSS.

## 2023-08-15 NOTE — H&P (Signed)
 PREOPERATIVE H&P  Chief Complaint: right shoulder rotator cuff tear  HPI: [name] [name] is a 48 y.o. female who presents for surgical treatment of right shoulder rotator cuff tear.  She denies any changes in medical history.  Past Surgical History:  Procedure Laterality Date   ABDOMINAL HYSTERECTOMY     CHOLECYSTECTOMY N/A 06/22/2017   Procedure: LAPAROSCOPIC CHOLECYSTECTOMY;  Surgeon: Kinsinger, Alphonso Aschoff, MD;  Location: [facility] OR;  Service: General;  Laterality: N/A;   I & D EXTREMITY Right 09/14/2015   Procedure: IRRIGATION AND DEBRIDEMENT RIGHT MIDDLE FINGER;  Surgeon: Ronn Cohn, MD;  Location: [facility] OR;  Service: Orthopedics;  Laterality: Right;   TUBAL LIGATION     Social History   Socioeconomic History   Marital status: Single    Spouse name: Not on file   Number of children: 3   Years of education: Not on file   Highest education level: Not on file  Occupational History   Not on file  Tobacco Use   Smoking status: Never    Passive exposure: Never   Smokeless tobacco: Never  Vaping Use   Vaping status: Never Used  Substance and Sexual Activity   Alcohol use: Not Currently    Comment: occ   Drug use: No   Sexual activity: Not on file    Comment: Hysterectomy  Other Topics Concern   Not on file  Social History Narrative   Pt lives with son    Pt not working    Social Drivers of Corporate investment banker Strain: Not on file  Food Insecurity: Not on file  Transportation Needs: Not on file  Physical Activity: Not on file  Stress: Not on file  Social Connections: Unknown (07/19/2021)   Received from [employer], [facility]   Social Network    Social Network: Not on file   Family History  Problem Relation Age of Onset   Hypertension Mother    Diabetes Mellitus II Mother    Congestive Heart Failure Father    Hypertension Father    Diabetes Mellitus II Father    Sleep apnea Father    Sleep apnea Son    Colon cancer Neg Hx    Esophageal  cancer Neg Hx    Rectal cancer Neg Hx    Stomach cancer Neg Hx    Colon polyps Neg Hx    Allergies  Allergen Reactions   Hydromorphone  Hcl Anaphylaxis and Hives   Meperidine  Hcl Anaphylaxis and Hives   Morphine Anaphylaxis and Hives    Cannot take anything in this family  Anything in this family, REACTION: ?anaphyaxis, Anything in this family   Gramineae Pollens Other (See Comments)    Stuffy nose   Lisinopril Cough   Sumatriptan Other (See Comments)    Makes headaches worse.   Tape Rash    EKG LEADS BLISTER THE PATIENT'S SKIN AND LEAVE RED, RAISED AREAS!!   Wound Dressing Adhesive Dermatitis    EKG LEADS BLISTER THE PATIENT'S SKIN AND LEAVE RED, RAISED AREAS!!   Zolmitriptan Other (See Comments)    Headache   Prior to Admission medications   Medication Sig Start Date End Date Taking? Authorizing Provider  acetaminophen  (TYLENOL ) 500 MG tablet Take 500 mg by mouth every 6 (six) hours as needed for moderate pain (pain score 4-6).   Yes [provider]  cetirizine  (ZYRTEC ) 10 MG tablet Take 1 tablet (10 mg total) by mouth daily. 02/22/21  Yes Paige, Victoria J, DO  ibuprofen  (  IBU) 800 MG tablet Take by mouth 3 (three) times daily. 05/12/21  Yes [provider]  lamoTRIgine  (LAMICTAL ) 100 MG tablet Take 2 tablets (200 mg total) by mouth daily. 06/20/23  Yes Glenn Lange, DO  metFORMIN  (GLUCOPHAGE -XR) 500 MG 24 hr tablet Take 2 tablets (1,000 mg total) by mouth 2 (two) times daily with a meal. 04/18/23  Yes Limmie Ren, MD  modafinil  (PROVIGIL ) 200 MG tablet Take 1 tablet (200 mg total) by mouth daily. 07/24/23  Yes Dohmeier, Raoul Byes, MD  polyethylene glycol powder (GLYCOLAX /MIRALAX ) 17 GM/SCOOP powder Mix 17 grams (1 capful) in beverage and take by mouth daily. 07/17/23  Yes Limmie Ren, MD  Semaglutide ,0.25 or 0.5MG /DOS, (OZEMPIC , 0.25 OR 0.5 MG/DOSE,) 2 MG/3ML SOPN Inject 0.25 mg into the skin once a week for 28 days, THEN 0.5 mg once a week for 28 days, THEN 1  mg once a week. 07/18/23 12/20/23 Yes Limmie Ren, MD  venlafaxine  XR (EFFEXOR  XR) 75 MG 24 hr capsule Take 1 capsule (75 mg total) by mouth daily with breakfast. To be taken WITH venlafaxine  XR 150mg  for a total dose of 225mg  daily 04/18/23  Yes Limmie Ren, MD  venlafaxine  XR (EFFEXOR -XR) 150 MG 24 hr capsule Take 1 capsule (150 mg total) by mouth daily. 04/05/23  Yes Limmie Ren, MD  Continuous Glucose Sensor (FREESTYLE LIBRE 3 PLUS SENSOR) MISC Change sensor every 15 days. 03/05/23   McDiarmid, Demetra Filter, MD  meloxicam  (MOBIC ) 7.5 MG tablet Take 1 tablet (7.5 mg total) by mouth daily. Patient not taking: Reported on 08/02/2023 03/26/23   Limmie Ren, MD  ondansetron  (ZOFRAN ) 4 MG tablet Take 1 tablet (4 mg total) by mouth every 8 (eight) hours as needed for nausea or vomiting. Patient not taking: Reported on 08/02/2023 07/24/23   Sandie Cross, PA-C  traMADol  (ULTRAM ) 50 MG tablet Take 1-2 tablets (50-100 mg total) by mouth 3 (three) times daily as needed. Patient not taking: Reported on 08/02/2023 07/31/23   Sandie Cross, PA-C     Positive ROS: All other systems have been reviewed and were otherwise negative with the exception of those mentioned in the HPI and as above.  Physical Exam: General: Alert, no acute distress Cardiovascular: No pedal edema Respiratory: No cyanosis, no use of accessory musculature GI: abdomen soft Skin: No lesions in the area of chief complaint Neurologic: Sensation intact distally Psychiatric: Patient is competent for consent with normal mood and affect Lymphatic: no lymphedema  MUSCULOSKELETAL: exam stable  Assessment: right shoulder rotator cuff tear  Plan: Plan for Procedure(s): ARTHROSCOPY, SHOULDER WITH DEBRIDEMENT  The risks benefits and alternatives were discussed with the patient including but not limited to the risks of nonoperative treatment, versus surgical intervention including infection, bleeding, nerve injury,  blood  clots, cardiopulmonary complications, morbidity, mortality, among others, and they were willing to proceed.   Claria Crofts, MD 08/15/2023 11:09 AM

## 2023-08-15 NOTE — Anesthesia Preprocedure Evaluation (Addendum)
 Anesthesia Evaluation  Patient identified by MRN, date of birth, ID band Patient awake    Reviewed: Allergy & Precautions, H&P , NPO status , Patient's Chart, lab work & pertinent test results  History of Anesthesia Complications Negative for: history of anesthetic complications  Airway Mallampati: II   Neck ROM: Full    Dental no notable dental hx. (+) Dental Advisory Given   Pulmonary sleep apnea and Continuous Positive Airway Pressure Ventilation    Pulmonary exam normal breath sounds clear to auscultation       Cardiovascular hypertension, Pt. on medications + Peripheral Vascular Disease   Rhythm:Regular Rate:Normal     Neuro/Psych   Anxiety Depression Bipolar Disorder   negative neurological ROS     GI/Hepatic Neg liver ROS, PUD,GERD  Medicated and Controlled,,  Endo/Other  diabetes, Type 2, Oral Hypoglycemic Agents  Class 3 obesity  Renal/GU negative Renal ROS  negative genitourinary   Musculoskeletal  (+) Arthritis , Osteoarthritis,    Abdominal  (+) + obese  Peds  Hematology negative hematology ROS (+) Blood dyscrasia, anemia   Anesthesia Other Findings   Reproductive/Obstetrics negative OB ROS                             Anesthesia Physical Anesthesia Plan  ASA: 3  Anesthesia Plan: General   Post-op Pain Management: Regional block* and Tylenol  PO (pre-op)*   Induction: Intravenous  PONV Risk Score and Plan: 3 and Ondansetron , Dexamethasone , Midazolam  and Treatment may vary due to age or medical condition  Airway Management Planned: Oral ETT  Additional Equipment:   Intra-op Plan:   Post-operative Plan: Extubation in OR  Informed Consent: I have reviewed the patients History and Physical, chart, labs and discussed the procedure including the risks, benefits and alternatives for the proposed anesthesia with the patient or authorized representative who has indicated  his/her understanding and acceptance.     Dental advisory given  Plan Discussed with: CRNA  Anesthesia Plan Comments: (Plan routine monitors, GETA)        Anesthesia Quick Evaluation

## 2023-08-16 ENCOUNTER — Encounter (HOSPITAL_BASED_OUTPATIENT_CLINIC_OR_DEPARTMENT_OTHER): Payer: Self-pay | Admitting: Orthopaedic Surgery

## 2023-08-17 ENCOUNTER — Telehealth: Payer: Self-pay | Admitting: Radiology

## 2023-08-17 NOTE — Telephone Encounter (Signed)
 Patient left voicemail on triage line requesting return call to let her know about removing her dressing post op. She is status post right shoulder arthroscopy with debridement on 08/15/2023.  CB [phone]

## 2023-08-17 NOTE — Telephone Encounter (Signed)
 She can take off bandages and apply band-aids on pod #3.  Did she not get the po instructions?

## 2023-08-22 ENCOUNTER — Telehealth: Payer: Self-pay | Admitting: Physical Therapy

## 2023-08-22 ENCOUNTER — Ambulatory Visit (INDEPENDENT_AMBULATORY_CARE_PROVIDER_SITE_OTHER): Admitting: Physician Assistant

## 2023-08-22 DIAGNOSIS — Z9889 Other specified postprocedural states: Secondary | ICD-10-CM

## 2023-08-22 NOTE — Progress Notes (Signed)
 Post-Op Visit Note   Patient: [name] [name]           Date of Birth: 29-Aug-1975           MRN: [id number] Visit Date: 08/22/2023 PCP: Limmie Ren, MD   Assessment & Plan:  Chief Complaint:  Chief Complaint  Patient presents with   Right Shoulder - Follow-up    Right shoulder scope 08/15/2023   Visit Diagnoses:  1. History of arthroscopy of right shoulder     Plan: Patient is a pleasant 48 year old female who comes in today 1 week status post right shoulder arthroscopic debridement decompression 08/15/2023.  She has been doing well.  She has been taking Tylenol  3 for pain.  She has noticed some intermittent paresthesias to the right upper extremity as well as throughout other places in her body since surgery examination of her right shoulder reveals fully healed surgical portals with nylon sutures in place.  No evidence of infection or cellulitis.  Fingers are warm well-perfused.  She is neurovascularly intact distally.  Today, sutures were removed and Steri-Strips applied.  Intraoperative pictures reviewed.  I have sent in a referral for physical therapy.  Patient prefers a location in [place].  She will let me know if she has not heard anything from PT by the end of this week.  She will follow-up with us  in 5 weeks for recheck.  Call with concerns or questions.  Follow-Up Instructions: Return in about 5 weeks (around 09/26/2023).   Orders:  Orders Placed This Encounter  Procedures   Ambulatory referral to Physical Therapy   No orders of the defined types were placed in this encounter.   Imaging: No new imaging  PMFS History: Patient Active Problem List   Diagnosis Date Noted   Partial nontraumatic tear of right rotator cuff 08/15/2023   Impingement syndrome of right shoulder 08/15/2023   Excessive daytime sleepiness 12/27/2022   Sleep attack 12/27/2022   Nightmare disorder 12/27/2022   Snoring 12/27/2022   Chronic right shoulder pain 11/16/2022   ADHD  01/03/2022   Urine frequency 08/08/2018   OSA on CPAP 02/09/2016   Diabetes mellitus (HCC) 07/11/2012   Pompholyx eczema 11/12/2011   Vitamin D  deficiency 09/20/2007   MENOPAUSE, SURGICAL 04/24/2007   Diaphragmatic hernia 11/20/2006   Hyperlipidemia 05/03/2006   Class 3 severe obesity in adult 05/03/2006   Bipolar I disorder (HCC) 05/03/2006   ANXIETY 05/03/2006   HYPERTENSION, BENIGN SYSTEMIC 05/03/2006   PEPTIC ULCER DIS., UNSPEC. W/O OBSTRUCTION 05/03/2006   Past Medical History:  Diagnosis Date   Allergy    Anemia    Arthritis    Bipolar 1 disorder [facility])    Sees Dr. Liborio Reeds, psychology,  [phone]   Carpal tunnel syndrome    Depression    Diabetes mellitus without complication [facility])    Family history of adverse reaction to anesthesia    mother & son have difficulty waking   Fatigue    Ganglion of left wrist 07/26/2018   Lung nodule seen on imaging study 09/24/2013   Peripheral vascular disease (HCC)    Sleep apnea    uses cpap    Family History  Problem Relation Age of Onset   Hypertension Mother    Diabetes Mellitus II Mother    Congestive Heart Failure Father    Hypertension Father    Diabetes Mellitus II Father    Sleep apnea Father    Sleep apnea Son    Colon cancer Neg Hx  Esophageal cancer Neg Hx    Rectal cancer Neg Hx    Stomach cancer Neg Hx    Colon polyps Neg Hx     Past Surgical History:  Procedure Laterality Date   ABDOMINAL HYSTERECTOMY     CHOLECYSTECTOMY N/A 06/22/2017   Procedure: LAPAROSCOPIC CHOLECYSTECTOMY;  Surgeon: Kinsinger, Alphonso Aschoff, MD;  Location: [facility] OR;  Service: General;  Laterality: N/A;   I & D EXTREMITY Right 09/14/2015   Procedure: IRRIGATION AND DEBRIDEMENT RIGHT MIDDLE FINGER;  Surgeon: Ronn Cohn, MD;  Location: [facility] OR;  Service: Orthopedics;  Laterality: Right;   POSTERIOR LUMBAR FUSION 2 WITH HARDWARE REMOVAL Right 08/15/2023   Procedure: ARTHROSCOPY, SHOULDER WITH DEBRIDEMENT;  Surgeon: Wes Hamman, MD;   Location: [facility];  Service: Orthopedics;  Laterality: Right;   TUBAL LIGATION     Social History   Occupational History   Not on file  Tobacco Use   Smoking status: Never    Passive exposure: Never   Smokeless tobacco: Never  Vaping Use   Vaping status: Never Used  Substance and Sexual Activity   Alcohol use: Not Currently    Comment: occ   Drug use: No   Sexual activity: Not on file    Comment: Hysterectomy

## 2023-08-22 NOTE — Telephone Encounter (Signed)
 Duplicate request, pt requesting PT in [place].

## 2023-08-27 ENCOUNTER — Other Ambulatory Visit: Payer: Self-pay | Admitting: Student

## 2023-08-27 ENCOUNTER — Other Ambulatory Visit: Payer: Self-pay

## 2023-08-27 ENCOUNTER — Other Ambulatory Visit: Payer: Self-pay | Admitting: Family Medicine

## 2023-08-27 ENCOUNTER — Other Ambulatory Visit (HOSPITAL_COMMUNITY): Payer: Self-pay

## 2023-08-27 DIAGNOSIS — K5900 Constipation, unspecified: Secondary | ICD-10-CM

## 2023-08-27 MED ORDER — VENLAFAXINE HCL ER 150 MG PO CP24
150.0000 mg | ORAL_CAPSULE | Freq: Every day | ORAL | 3 refills | Status: DC
  Filled 2023-08-27: qty 30, 30d supply, fill #0
  Filled 2023-09-28: qty 30, 30d supply, fill #1
  Filled 2023-10-23: qty 30, 30d supply, fill #2
  Filled 2023-12-13: qty 30, 30d supply, fill #3

## 2023-08-27 MED ORDER — POLYETHYLENE GLYCOL 3350 17 GM/SCOOP PO POWD
17.0000 g | Freq: Every day | ORAL | 0 refills | Status: DC
  Filled 2023-08-27: qty 510, 30d supply, fill #0

## 2023-08-27 MED ORDER — CETIRIZINE HCL 10 MG PO TABS
10.0000 mg | ORAL_TABLET | Freq: Every day | ORAL | 11 refills | Status: AC
  Filled 2023-08-27: qty 30, 30d supply, fill #0
  Filled 2023-09-28: qty 30, 30d supply, fill #1
  Filled 2023-10-23: qty 30, 30d supply, fill #2
  Filled 2023-12-07: qty 30, 30d supply, fill #3
  Filled 2024-01-07: qty 30, 30d supply, fill #4
  Filled 2024-02-11: qty 30, 30d supply, fill #5
  Filled 2024-03-07: qty 30, 30d supply, fill #6
  Filled 2024-04-11 (×2): qty 30, 30d supply, fill #7

## 2023-08-27 NOTE — Telephone Encounter (Signed)
 Can someone help patient with PT referral please. See messages below. Thank you.

## 2023-08-27 NOTE — Telephone Encounter (Signed)
 Referral faxed to [facility] in [place] at [phone].

## 2023-08-28 ENCOUNTER — Other Ambulatory Visit: Payer: Self-pay

## 2023-08-28 ENCOUNTER — Other Ambulatory Visit (HOSPITAL_COMMUNITY): Payer: Self-pay

## 2023-08-28 NOTE — Telephone Encounter (Signed)
 yes

## 2023-08-29 ENCOUNTER — Encounter: Payer: Self-pay | Admitting: Student

## 2023-08-29 ENCOUNTER — Ambulatory Visit: Attending: Physician Assistant

## 2023-08-29 ENCOUNTER — Other Ambulatory Visit (HOSPITAL_COMMUNITY): Payer: Self-pay

## 2023-08-29 DIAGNOSIS — M6281 Muscle weakness (generalized): Secondary | ICD-10-CM | POA: Insufficient documentation

## 2023-08-29 DIAGNOSIS — R293 Abnormal posture: Secondary | ICD-10-CM | POA: Diagnosis present

## 2023-08-29 DIAGNOSIS — M25511 Pain in right shoulder: Secondary | ICD-10-CM | POA: Diagnosis present

## 2023-08-29 DIAGNOSIS — Z9889 Other specified postprocedural states: Secondary | ICD-10-CM | POA: Insufficient documentation

## 2023-08-29 NOTE — Therapy (Signed)
 OUTPATIENT PHYSICAL THERAPY SHOULDER EVALUATION   Patient Name: [name] [name] MRN: [id number] DOB:23-Oct-1975, 48 y.o., female Today's Date: 08/30/2023  END OF SESSION:  PT End of Session - 08/30/23 0843     Visit Number 1    Number of Visits 17    Date for PT Re-Evaluation 11/21/23    Authorization Type [place] [employer]    PT Start Time 1015    PT Stop Time 1042    PT Time Calculation (min) 27 min    Activity Tolerance Patient limited by pain    Behavior During Therapy [facility] for tasks assessed/performed          Past Medical History:  Diagnosis Date   Allergy    Anemia    Arthritis    Bipolar 1 disorder (HCC)    Sees Dr. Kermit, psychology,  [phone]   Carpal tunnel syndrome    Depression    Diabetes mellitus without complication (HCC)    Family history of adverse reaction to anesthesia    mother & son have difficulty waking   Fatigue    Ganglion of left wrist 07/26/2018   Lung nodule seen on imaging study 09/24/2013   Peripheral vascular disease (HCC)    Sleep apnea    uses cpap   Past Surgical History:  Procedure Laterality Date   ABDOMINAL HYSTERECTOMY     CHOLECYSTECTOMY N/A 06/22/2017   Procedure: LAPAROSCOPIC CHOLECYSTECTOMY;  Surgeon: Stevie Herlene Righter, MD;  Location: [facility] OR;  Service: General;  Laterality: N/A;   I & D EXTREMITY Right 09/14/2015   Procedure: IRRIGATION AND DEBRIDEMENT RIGHT MIDDLE FINGER;  Surgeon: Elsie Mussel, MD;  Location: [facility] OR;  Service: Orthopedics;  Laterality: Right;   POSTERIOR LUMBAR FUSION 2 WITH HARDWARE REMOVAL Right 08/15/2023   Procedure: ARTHROSCOPY, SHOULDER WITH DEBRIDEMENT;  Surgeon: Jerri Kay HERO, MD;  Location: [facility];  Service: Orthopedics;  Laterality: Right;   TUBAL LIGATION     Patient Active Problem List   Diagnosis Date Noted   Partial nontraumatic tear of right rotator cuff 08/15/2023   Impingement syndrome of right shoulder 08/15/2023   Excessive daytime sleepiness 12/27/2022   Sleep  attack 12/27/2022   Nightmare disorder 12/27/2022   Snoring 12/27/2022   Chronic right shoulder pain 11/16/2022   ADHD 01/03/2022   Urine frequency 08/08/2018   OSA on CPAP 02/09/2016   Diabetes mellitus (HCC) 07/11/2012   Pompholyx eczema 11/12/2011   Vitamin D  deficiency 09/20/2007   MENOPAUSE, SURGICAL 04/24/2007   Diaphragmatic hernia 11/20/2006   Hyperlipidemia 05/03/2006   Class 3 severe obesity in adult 05/03/2006   Bipolar I disorder (HCC) 05/03/2006   ANXIETY 05/03/2006   HYPERTENSION, BENIGN SYSTEMIC 05/03/2006   PEPTIC ULCER DIS., UNSPEC. W/O OBSTRUCTION 05/03/2006    PCP: Marlee Lynwood NOVAK, MD  REFERRING PROVIDER: Jule Ronal CROME, PA-C  REFERRING DIAG: [phone] (ICD-10-CM) - History of arthroscopy of right shoulder   THERAPY DIAG:  Acute pain of right shoulder  Muscle weakness (generalized)  Abnormal posture  Rationale for Evaluation and Treatment: Rehabilitation  ONSET DATE: 08/15/2023  SUBJECTIVE:  SUBJECTIVE STATEMENT: Pt presents to PT s/p R shoulder arthroscopic debridement, SAD, and biceps tenotomy performed by Dr. Jerri on 08/15/2023. Notes that she has been doing pretty well post op and has some N/T down R UE but nothing too significant. Has had trouble lifting which PT stated to hold off on significant weight. Also told her not to flex elbow with weight due to biceps tenotomy.    Hand dominance: Right  PERTINENT HISTORY: DM II, bipolar, depression  PAIN:  Are you having pain?  Yes: NPRS scale: 6/10 Worst: 10/10 Pain location: R anterior shoulder Pain description: sharp, sore Aggravating factors: OH reaching, lifting Relieving factors: rest  PRECAUTIONS: avoid resisted elbow flexion  RED FLAGS: None   WEIGHT BEARING RESTRICTIONS: No  FALLS:  Has patient fallen in  last 6 months? No  LIVING ENVIRONMENT: Lives with: lives with their family Lives in: House/apartment Stairs: No Has following equipment at home: None  OCCUPATION: Not working  PLOF: Independent  PATIENT GOALS: get back   NEXT MD VISIT: 09/28/2023  OBJECTIVE:  Note: Objective measures were completed at Evaluation unless otherwise noted.  DIAGNOSTIC FINDINGS:  See imaging   PATIENT SURVEYS:  Quick DASH: 80% disability  COGNITION: Overall cognitive status: Within functional limits for tasks assessed     SENSATION: Light touch: Impaired - anterior R UE  POSTURE: Rounded shoulder, large body habitus  UPPER EXTREMITY ROM:   Active ROM Right eval Left eval  Shoulder flexion 60 WFL  Shoulder extension    Shoulder abduction 83 WFL  Shoulder adduction    Shoulder internal rotation  WFL  Shoulder external rotation 25 WFL  Elbow flexion    Elbow extension    Wrist flexion    Wrist extension    Wrist ulnar deviation    Wrist radial deviation    Wrist pronation    Wrist supination    (Blank rows = not tested)  UPPER EXTREMITY MMT:  MMT Right eval Left eval  Shoulder flexion DNT 4  Shoulder extension    Shoulder abduction DNT 4  Shoulder adduction    Shoulder internal rotation DNT 4  Shoulder external rotation DNT   Middle trapezius    Lower trapezius    Elbow flexion    Elbow extension    Wrist flexion    Wrist extension    Wrist ulnar deviation    Wrist radial deviation    Wrist pronation    Wrist supination    Grip strength (lbs)    (Blank rows = not tested)  SHOULDER SPECIAL TESTS: DNT  JOINT MOBILITY TESTING:  GH hypomobility  PALPATION:  TTP to R anterior deltoid, R infraspinatus    TREATMENT: [facility] Adult PT Treatment:                                                DATE: 08/29/2023 Therapeutic Exercise: Seated scapular retraction x 5  Seated flexion table slide x 5 R Seated abd table slide x 5 R Supine shoulder flex with L UE support  x 5 AAROM R   PATIENT EDUCATION: Education details: eval findings, Quick DASH, HEP, POC Person educated: Patient Education method: Explanation, Demonstration, and Handouts Education comprehension: verbalized understanding and returned demonstration  HOME EXERCISE PROGRAM: Access Code: [email or web address] URL: [email or web address] Date: 08/29/2023 Prepared by: Alm Kingdom  Exercises - Seated Scapular Retraction  - 1-2 x  daily - 7 x weekly - 2 sets - 10 reps - 3-5 sec hold - Seated Shoulder Flexion Towel Slide at Table Top  - 1-2 x daily - 7 x weekly - 3 sets - 10 reps - 5 sec hold - Seated Shoulder Abduction Towel Slide at Table Top  - 1-2 x daily - 7 x weekly - 3 sets - 10 reps - 5 sec hold - Supine Shoulder Flexion AAROM with Hands Clasped  - 1-2 x daily - 7 x weekly - 3 sets - 10 reps - 5 sec hold  ASSESSMENT:  CLINICAL IMPRESSION: Patient is a 48 y.o. F who was seen today for physical therapy evaluation and treatment s/p R shoulder arthroscopic debridement, SAD, and biceps tenotomy performed by Dr. Jerri on 08/15/2023. Physical findings are consistent with surgery and recovery time post op as she demonstrates decrease R shoulder ROM and strength. Quick DASH shows severe disability in performance of home ADLs and community activities. Pt would benefit form skilled PT services post op working on improving proximal strength and shoulder function.   OBJECTIVE IMPAIRMENTS: decreased activity tolerance, decreased endurance, decreased mobility, decreased ROM, decreased strength, postural dysfunction, and pain   ACTIVITY LIMITATIONS: carrying, lifting, reach over head, and hygiene/grooming  PARTICIPATION LIMITATIONS: meal prep, cleaning, driving, shopping, community activity, and yard work  PERSONAL FACTORS: Time since onset of injury/illness/exacerbation and 3+ comorbidities: DM II, bipolar, depression are also affecting patient's functional outcome.   REHAB POTENTIAL: Good  CLINICAL  DECISION MAKING: Evolving/moderate complexity  EVALUATION COMPLEXITY: Moderate   GOALS: Goals reviewed with patient? No  SHORT TERM GOALS: Target date: 09/19/2023   Pt will be compliant and knowledgeable with initial HEP for improved comfort and carryover Baseline: initial HEP given  Goal status: INITIAL  2.  Pt will self report right shoulder pain no greater than 6/10 for improved comfort and functional ability Baseline: 10/10 at worst Goal status: INITIAL   LONG TERM GOALS: Target date: 11/21/2023   Pt will decrease Quick DASH disability score to no greater than 50% as proxy for functional improvement Baseline: 80% disability  Goal status: INITIAL  2.  Pt will self report right shoulder pain no greater than 3/10 for improved comfort and functional ability Baseline: 10/10 at worst Goal status: INITIAL    3.  Pt will improve R shoulder flex to at least 140 degrees for improved functional with home and community activities  Baseline: see ROM chart Goal status: INITIAL  4.  Pt will improve R shoulder MMT to at least 4/5 for improved functional ability and shoulder stability with OH motion Baseline: DNT Goal status: INITIAL   PLAN:  PT FREQUENCY: 2x/week  PT DURATION: 8 weeks  PLANNED INTERVENTIONS: 97164- PT Re-evaluation, 97110-Therapeutic exercises, 97530- Therapeutic activity, [id number]- Neuromuscular re-education, 97535- Self Care, 02859- Manual therapy, [id number]- Electrical stimulation (unattended), [id number]- Electrical stimulation (manual), 97016- Vasopneumatic device, 20560 (1-2 muscles), 20561 (3+ muscles)- Dry Needling, Cryotherapy, and Moist heat  PLAN FOR NEXT SESSION: assess HEP response, progress flexion and strength, improve comfort with OH motion   Alm JAYSON Kingdom, PT 08/30/2023, 11:10 AM

## 2023-08-30 ENCOUNTER — Other Ambulatory Visit: Payer: Self-pay

## 2023-09-03 ENCOUNTER — Other Ambulatory Visit (HOSPITAL_COMMUNITY): Payer: Self-pay

## 2023-09-06 ENCOUNTER — Ambulatory Visit: Payer: Self-pay | Attending: Physician Assistant | Admitting: Physical Therapy

## 2023-09-06 ENCOUNTER — Encounter: Payer: Self-pay | Admitting: Physical Therapy

## 2023-09-06 DIAGNOSIS — R293 Abnormal posture: Secondary | ICD-10-CM | POA: Insufficient documentation

## 2023-09-06 DIAGNOSIS — M6281 Muscle weakness (generalized): Secondary | ICD-10-CM | POA: Diagnosis present

## 2023-09-06 DIAGNOSIS — M25511 Pain in right shoulder: Secondary | ICD-10-CM | POA: Diagnosis present

## 2023-09-06 NOTE — Therapy (Signed)
 OUTPATIENT PHYSICAL THERAPY SHOULDER TREATMENT   Patient Name: [name] [name] MRN: [id number] DOB:January 28, 1976, 48 y.o., female Today's Date: 09/06/2023  END OF SESSION:  PT End of Session - 09/06/23 0715     Visit Number 2    Number of Visits 17    Date for PT Re-Evaluation 11/21/23    Authorization Type [place] [employer]    PT Start Time 0720    PT Stop Time 0758    PT Time Calculation (min) 38 min          Past Medical History:  Diagnosis Date   Allergy    Anemia    Arthritis    Bipolar 1 disorder (HCC)    Sees Dr. Kermit, psychology,  [phone]   Carpal tunnel syndrome    Depression    Diabetes mellitus without complication (HCC)    Family history of adverse reaction to anesthesia    mother & son have difficulty waking   Fatigue    Ganglion of left wrist 07/26/2018   Lung nodule seen on imaging study 09/24/2013   Peripheral vascular disease (HCC)    Sleep apnea    uses cpap   Past Surgical History:  Procedure Laterality Date   ABDOMINAL HYSTERECTOMY     CHOLECYSTECTOMY N/A 06/22/2017   Procedure: LAPAROSCOPIC CHOLECYSTECTOMY;  Surgeon: Stevie Herlene Righter, MD;  Location: [facility] OR;  Service: General;  Laterality: N/A;   I & D EXTREMITY Right 09/14/2015   Procedure: IRRIGATION AND DEBRIDEMENT RIGHT MIDDLE FINGER;  Surgeon: Elsie Mussel, MD;  Location: [facility] OR;  Service: Orthopedics;  Laterality: Right;   POSTERIOR LUMBAR FUSION 2 WITH HARDWARE REMOVAL Right 08/15/2023   Procedure: ARTHROSCOPY, SHOULDER WITH DEBRIDEMENT;  Surgeon: Jerri Kay HERO, MD;  Location: [facility];  Service: Orthopedics;  Laterality: Right;   TUBAL LIGATION     Patient Active Problem List   Diagnosis Date Noted   Partial nontraumatic tear of right rotator cuff 08/15/2023   Impingement syndrome of right shoulder 08/15/2023   Excessive daytime sleepiness 12/27/2022   Sleep attack 12/27/2022   Nightmare disorder 12/27/2022   Snoring 12/27/2022   Chronic right shoulder pain  11/16/2022   ADHD 01/03/2022   Urine frequency 08/08/2018   OSA on CPAP 02/09/2016   Diabetes mellitus (HCC) 07/11/2012   Pompholyx eczema 11/12/2011   Vitamin D  deficiency 09/20/2007   MENOPAUSE, SURGICAL 04/24/2007   Diaphragmatic hernia 11/20/2006   Hyperlipidemia 05/03/2006   Class 3 severe obesity in adult 05/03/2006   Bipolar I disorder (HCC) 05/03/2006   ANXIETY 05/03/2006   HYPERTENSION, BENIGN SYSTEMIC 05/03/2006   PEPTIC ULCER DIS., UNSPEC. W/O OBSTRUCTION 05/03/2006    PCP: Marlee Lynwood NOVAK, MD  REFERRING PROVIDER: Jule Ronal CROME, PA-C  REFERRING DIAG: [phone] (ICD-10-CM) - History of arthroscopy of right shoulder   THERAPY DIAG:  Acute pain of right shoulder  Rationale for Evaluation and Treatment: Rehabilitation  ONSET DATE: 08/15/2023  SUBJECTIVE:  SUBJECTIVE STATEMENT: Pt presents to PT s/p R shoulder arthroscopic debridement, SAD, and biceps tenotomy performed by Dr. Jerri on 08/15/2023. Notes that she has been doing pretty well post op and has some N/T down R UE but nothing too significant. Has had trouble lifting which PT stated to hold off on significant weight. Also told her not to flex elbow with weight due to biceps tenotomy.    Hand dominance: Right  PERTINENT HISTORY: DM II, bipolar, depression  PAIN:  Are you having pain?  Yes: NPRS scale: 6/10 Worst: 10/10 Pain location: R anterior shoulder Pain description: sharp, sore Aggravating factors: OH reaching, lifting Relieving factors: rest  PRECAUTIONS: avoid resisted elbow flexion  RED FLAGS: None   WEIGHT BEARING RESTRICTIONS: No  FALLS:  Has patient fallen in last 6 months? No  LIVING ENVIRONMENT: Lives with: lives with their family Lives in: House/apartment Stairs: No Has following equipment at home:  None  OCCUPATION: Not working  PLOF: Independent  PATIENT GOALS: get back   NEXT MD VISIT: 09/28/2023  OBJECTIVE:  Note: Objective measures were completed at Evaluation unless otherwise noted.  DIAGNOSTIC FINDINGS:  See imaging   PATIENT SURVEYS:  Quick DASH: 80% disability  COGNITION: Overall cognitive status: Within functional limits for tasks assessed     SENSATION: Light touch: Impaired - anterior R UE  POSTURE: Rounded shoulder, large body habitus  UPPER EXTREMITY ROM:   Active ROM Right eval Left eval  Shoulder flexion 60 WFL  Shoulder extension    Shoulder abduction 83 WFL  Shoulder adduction    Shoulder internal rotation  WFL  Shoulder external rotation 25 WFL  Elbow flexion    Elbow extension    Wrist flexion    Wrist extension    Wrist ulnar deviation    Wrist radial deviation    Wrist pronation    Wrist supination    (Blank rows = not tested)  UPPER EXTREMITY MMT:  MMT Right eval Left eval  Shoulder flexion DNT 4  Shoulder extension    Shoulder abduction DNT 4  Shoulder adduction    Shoulder internal rotation DNT 4  Shoulder external rotation DNT   Middle trapezius    Lower trapezius    Elbow flexion    Elbow extension    Wrist flexion    Wrist extension    Wrist ulnar deviation    Wrist radial deviation    Wrist pronation    Wrist supination    Grip strength (lbs)    (Blank rows = not tested)  SHOULDER SPECIAL TESTS: DNT  JOINT MOBILITY TESTING:  GH hypomobility  PALPATION:  TTP to R anterior deltoid, R infraspinatus    TREATMENT: [facility] Adult PT Treatment:                                                DATE: 09/06/23 Therapeutic Exercise: Seated table slides flexion and abduction 10 x 2 each  Supine AAROM shoulder flexion 10 x 2  Supine ER AAROM with dowel  Scap retractions x 15 Passive shoulder flexion, abduction, ER , IR Isometric shoulder ER 3 sec x 10 Isometric shoulder IR 3 sec x 10  Self Care: Discussion of  precautions: avoid pain, no lifting (avoid resisted elbow flexion)     [facility] Adult PT Treatment:  DATE: 08/29/2023 Therapeutic Exercise: Seated scapular retraction x 5  Seated flexion table slide x 5 R Seated abd table slide x 5 R Supine shoulder flex with L UE support x 5 AAROM R   PATIENT EDUCATION: Education details: eval findings, Quick DASH, HEP, POC Person educated: Patient Education method: Explanation, Demonstration, and Handouts Education comprehension: verbalized understanding and returned demonstration  HOME EXERCISE PROGRAM: Access Code: [email or web address] URL: [email or web address] Date: 08/29/2023 Prepared by: Alm Kingdom  Exercises - Seated Scapular Retraction  - 1-2 x daily - 7 x weekly - 2 sets - 10 reps - 3-5 sec hold - Seated Shoulder Flexion Towel Slide at Table Top  - 1-2 x daily - 7 x weekly - 3 sets - 10 reps - 5 sec hold - Seated Shoulder Abduction Towel Slide at Table Top  - 1-2 x daily - 7 x weekly - 3 sets - 10 reps - 5 sec hold - Supine Shoulder Flexion AAROM with Hands Clasped  - 1-2 x daily - 7 x weekly - 3 sets - 10 reps - 5 sec hold  ASSESSMENT:  CLINICAL IMPRESSION: Pt is 3 weeks post op bicep tenotomy. She reports compliance with HEP. Began RTC isometrics with discomfort. Updated HEP. Time spent with review of precautions, avoidance of resisted elbow flexion as well as activities / movements that cause pain.    EVAL: Patient is a 48 y.o. F who was seen today for physical therapy evaluation and treatment s/p R shoulder arthroscopic debridement, SAD, and biceps tenotomy performed by Dr. Jerri on 08/15/2023. Physical findings are consistent with surgery and recovery time post op as she demonstrates decrease R shoulder ROM and strength. Quick DASH shows severe disability in performance of home ADLs and community activities. Pt would benefit form skilled PT services post op working on improving proximal  strength and shoulder function.   OBJECTIVE IMPAIRMENTS: decreased activity tolerance, decreased endurance, decreased mobility, decreased ROM, decreased strength, postural dysfunction, and pain   ACTIVITY LIMITATIONS: carrying, lifting, reach over head, and hygiene/grooming  PARTICIPATION LIMITATIONS: meal prep, cleaning, driving, shopping, community activity, and yard work  PERSONAL FACTORS: Time since onset of injury/illness/exacerbation and 3+ comorbidities: DM II, bipolar, depression are also affecting patient's functional outcome.   REHAB POTENTIAL: Good  CLINICAL DECISION MAKING: Evolving/moderate complexity  EVALUATION COMPLEXITY: Moderate   GOALS: Goals reviewed with patient? No  SHORT TERM GOALS: Target date: 09/19/2023   Pt will be compliant and knowledgeable with initial HEP for improved comfort and carryover Baseline: initial HEP given  Goal status: INITIAL  2.  Pt will self report right shoulder pain no greater than 6/10 for improved comfort and functional ability Baseline: 10/10 at worst Goal status: INITIAL   LONG TERM GOALS: Target date: 11/21/2023   Pt will decrease Quick DASH disability score to no greater than 50% as proxy for functional improvement Baseline: 80% disability  Goal status: INITIAL  2.  Pt will self report right shoulder pain no greater than 3/10 for improved comfort and functional ability Baseline: 10/10 at worst Goal status: INITIAL    3.  Pt will improve R shoulder flex to at least 140 degrees for improved functional with home and community activities  Baseline: see ROM chart Goal status: INITIAL  4.  Pt will improve R shoulder MMT to at least 4/5 for improved functional ability and shoulder stability with OH motion Baseline: DNT Goal status: INITIAL   PLAN:  PT FREQUENCY: 2x/week  PT DURATION: 8 weeks  PLANNED INTERVENTIONS: 02835- PT  Re-evaluation, 97110-Therapeutic exercises, 97530- Therapeutic activity, [id number]- Neuromuscular  re-education, 97535- Self Care, 02859- Manual therapy, G0283- Electrical stimulation (unattended), [phone]- Electrical stimulation (manual), [id number]- Vasopneumatic device, 20560 (1-2 muscles), 20561 (3+ muscles)- Dry Needling, Cryotherapy, and Moist heat  PLAN FOR NEXT SESSION: assess HEP response, progress flexion and strength, improve comfort with OH motion   Carita Harlene Brain, PTA 09/06/2023, 8:12 AM

## 2023-09-10 ENCOUNTER — Telehealth: Payer: Self-pay | Admitting: Orthopaedic Surgery

## 2023-09-10 ENCOUNTER — Other Ambulatory Visit (HOSPITAL_COMMUNITY): Payer: Self-pay

## 2023-09-10 NOTE — Telephone Encounter (Signed)
 Pt states she was told by PT on 09/06/23 that she was supposed to be wearing a sling. Pt states at her last ov she was never told that and wants to know if she should be wearing it.

## 2023-09-10 NOTE — Telephone Encounter (Signed)
 We did not repair her rotator cuff nor do a biceps tenodesis so she does NOT need to wear sling

## 2023-09-10 NOTE — Telephone Encounter (Signed)
 Called patient. She wants to know what her limitations are? No lifting, pushing, or pulling?

## 2023-09-11 NOTE — Telephone Encounter (Signed)
 I called and spoke with patient. She said that its going to hurt in order to progress, but she has questions on how much she should tolerate or do. Please call patient. I'm not able to answer her questions and apparently PT hasn't been able to explain clearly either.

## 2023-09-11 NOTE — Telephone Encounter (Signed)
 Everything as tolerated

## 2023-09-11 NOTE — Telephone Encounter (Signed)
 Spoke to patient

## 2023-09-13 ENCOUNTER — Ambulatory Visit

## 2023-09-13 DIAGNOSIS — R293 Abnormal posture: Secondary | ICD-10-CM

## 2023-09-13 DIAGNOSIS — M25511 Pain in right shoulder: Secondary | ICD-10-CM

## 2023-09-13 DIAGNOSIS — M6281 Muscle weakness (generalized): Secondary | ICD-10-CM

## 2023-09-13 NOTE — Therapy (Signed)
 OUTPATIENT PHYSICAL THERAPY SHOULDER TREATMENT   Patient Name: [name] [name] MRN: [id number] DOB:1976-02-09, 48 y.o., female Today's Date: 09/13/2023  END OF SESSION:  PT End of Session - 09/13/23 0806     Visit Number 3    Number of Visits 17    Date for PT Re-Evaluation 11/21/23    Authorization Type [place] [employer]    PT Start Time 0805    PT Stop Time 0843    PT Time Calculation (min) 38 min           Past Medical History:  Diagnosis Date   Allergy    Anemia    Arthritis    Bipolar 1 disorder (HCC)    Sees Dr. Kermit, psychology,  [phone]   Carpal tunnel syndrome    Depression    Diabetes mellitus without complication (HCC)    Family history of adverse reaction to anesthesia    mother & son have difficulty waking   Fatigue    Ganglion of left wrist 07/26/2018   Lung nodule seen on imaging study 09/24/2013   Peripheral vascular disease (HCC)    Sleep apnea    uses cpap   Past Surgical History:  Procedure Laterality Date   ABDOMINAL HYSTERECTOMY     CHOLECYSTECTOMY N/A 06/22/2017   Procedure: LAPAROSCOPIC CHOLECYSTECTOMY;  Surgeon: [name] Herlene Righter, MD;  Location: [facility] OR;  Service: General;  Laterality: N/A;   I & D EXTREMITY Right 09/14/2015   Procedure: IRRIGATION AND DEBRIDEMENT RIGHT MIDDLE FINGER;  Surgeon: [name] Mussel, MD;  Location: [facility] OR;  Service: Orthopedics;  Laterality: Right;   POSTERIOR LUMBAR FUSION 2 WITH HARDWARE REMOVAL Right 08/15/2023   Procedure: ARTHROSCOPY, SHOULDER WITH DEBRIDEMENT;  Surgeon: [name] Kay HERO, MD;  Location: [facility];  Service: Orthopedics;  Laterality: Right;   TUBAL LIGATION     Patient Active Problem List   Diagnosis Date Noted   Partial nontraumatic tear of right rotator cuff 08/15/2023   Impingement syndrome of right shoulder 08/15/2023   Excessive daytime sleepiness 12/27/2022   Sleep attack 12/27/2022   Nightmare disorder 12/27/2022   Snoring 12/27/2022   Chronic right shoulder pain  11/16/2022   ADHD 01/03/2022   Urine frequency 08/08/2018   OSA on CPAP 02/09/2016   Diabetes mellitus (HCC) 07/11/2012   Pompholyx eczema 11/12/2011   Vitamin D  deficiency 09/20/2007   MENOPAUSE, SURGICAL 04/24/2007   Diaphragmatic hernia 11/20/2006   Hyperlipidemia 05/03/2006   Class 3 severe obesity in adult 05/03/2006   Bipolar I disorder (HCC) 05/03/2006   ANXIETY 05/03/2006   HYPERTENSION, BENIGN SYSTEMIC 05/03/2006   PEPTIC ULCER DIS., UNSPEC. W/O OBSTRUCTION 05/03/2006    PCP: [name] Lynwood NOVAK, MD  REFERRING PROVIDER: Jule Ronal CROME, PA-C  REFERRING DIAG: [phone] (ICD-10-CM) - History of arthroscopy of right shoulder   THERAPY DIAG:  Acute pain of right shoulder  Muscle weakness (generalized)  Abnormal posture  Rationale for Evaluation and Treatment: Rehabilitation  ONSET DATE: 08/15/2023  SUBJECTIVE:  SUBJECTIVE STATEMENT: Pt presents with decreased pain. Cleared for all activities as tolerated.   Hand dominance: Right  PERTINENT HISTORY: DM II, bipolar, depression  PAIN:  Are you having pain?  Yes: NPRS scale: 6/10 Worst: 10/10 Pain location: R anterior shoulder Pain description: sharp, sore Aggravating factors: OH reaching, lifting Relieving factors: rest  PRECAUTIONS: avoid resisted elbow flexion  RED FLAGS: None   WEIGHT BEARING RESTRICTIONS: No  FALLS:  Has patient fallen in last 6 months? No  LIVING ENVIRONMENT: Lives with: lives with their family Lives in: House/apartment Stairs: No Has following equipment at home: None  OCCUPATION: Not working  PLOF: Independent  PATIENT GOALS: get back   NEXT MD VISIT: 09/28/2023  OBJECTIVE:  Note: Objective measures were completed at Evaluation unless otherwise noted.  DIAGNOSTIC FINDINGS:  See imaging    PATIENT SURVEYS:  Quick DASH: 80% disability  COGNITION: Overall cognitive status: Within functional limits for tasks assessed     SENSATION: Light touch: Impaired - anterior R UE  POSTURE: Rounded shoulder, large body habitus  UPPER EXTREMITY ROM:   Active ROM Right eval Left eval  Shoulder flexion 60 WFL  Shoulder extension    Shoulder abduction 83 WFL  Shoulder adduction    Shoulder internal rotation  WFL  Shoulder external rotation 25 WFL  Elbow flexion    Elbow extension    Wrist flexion    Wrist extension    Wrist ulnar deviation    Wrist radial deviation    Wrist pronation    Wrist supination    (Blank rows = not tested)  UPPER EXTREMITY MMT:  MMT Right eval Left eval  Shoulder flexion DNT 4  Shoulder extension    Shoulder abduction DNT 4  Shoulder adduction    Shoulder internal rotation DNT 4  Shoulder external rotation DNT   Middle trapezius    Lower trapezius    Elbow flexion    Elbow extension    Wrist flexion    Wrist extension    Wrist ulnar deviation    Wrist radial deviation    Wrist pronation    Wrist supination    Grip strength (lbs)    (Blank rows = not tested)  SHOULDER SPECIAL TESTS: DNT  JOINT MOBILITY TESTING:  GH hypomobility  PALPATION:  TTP to R anterior deltoid, R infraspinatus    TREATMENT: [facility] Adult PT Treatment:                                                DATE: 09/13/23 Supine shoulder flexion 2x10 R Supine dow flexion 2x10 AAROM S/L shoulder abd 2x10 R Seated scapular retractions x 10  Standing row 2x10 GTB  Pulleys into flexion x 2 min R shoulder IR/ER isometric x 10 - 5 hold Seated cane ER 2x10 [name] [name]   [facility] Adult PT Treatment:                                                DATE: 09/06/23 Therapeutic Exercise: Seated table slides flexion and abduction 10 x 2 each  Supine AAROM shoulder flexion 10 x 2  Supine ER AAROM with dowel  Scap retractions x 15 Passive shoulder flexion, abduction, ER ,  IR Isometric shoulder  ER 3 sec x 10 Isometric shoulder IR 3 sec x 10 Self Care: Discussion of precautions: avoid pain, no lifting (avoid resisted elbow flexion)   [facility] Adult PT Treatment:                                                DATE: 08/29/2023 Therapeutic Exercise: Seated scapular retraction x 5  Seated flexion table slide x 5 R Seated abd table slide x 5 R Supine shoulder flex with L UE support x 5 AAROM R   PATIENT EDUCATION: Education details: eval findings, Quick DASH, HEP, POC Person educated: Patient Education method: Explanation, Demonstration, and Handouts Education comprehension: verbalized understanding and returned demonstration  HOME EXERCISE PROGRAM: Access Code: [email or web address] URL: [email or web address] Date: 09/13/2023 Prepared by: Alm Kingdom  Exercises - Seated Scapular Retraction  - 1-2 x daily - 7 x weekly - 2 sets - 10 reps - 3-5 sec hold - Seated Shoulder Flexion Towel Slide at Table Top  - 1-2 x daily - 7 x weekly - 3 sets - 10 reps - 5 sec hold - Seated Shoulder Abduction Towel Slide at Table Top  - 1-2 x daily - 7 x weekly - 3 sets - 10 reps - 5 sec hold - Supine Shoulder Flexion AAROM with Hands Clasped  - 1-2 x daily - 7 x weekly - 3 sets - 10 reps - 5 sec hold - Standing Isometric Shoulder Internal Rotation at Doorway  - 1 x daily - 7 x weekly - 1 sets - 10 reps - 3-5 hold - Standing Isometric Shoulder External Rotation with Doorway  - 1 x daily - 7 x weekly - 3 sets - 10 reps - 3-5 hold - Supine Shoulder Flexion Extension Full Range AROM  - 1 x daily - 7 x weekly - 2 sets - 10 reps - Sidelying Shoulder Abduction Palm Forward  - 1 x daily - 7 x weekly - 2 sets - 10 reps  ASSESSMENT:  CLINICAL IMPRESSION: Pt was able to complete prescribed exercises with no adverse effect despite increased pain with IR/ER isometric. Exercises today progressed AROM and strength of R shoulder post op. Will continue to progress as able per POC.    EVAL:  Patient is a 48 y.o. F who was seen today for physical therapy evaluation and treatment s/p R shoulder arthroscopic debridement, SAD, and biceps tenotomy performed by Dr. Jerri on 08/15/2023. Physical findings are consistent with surgery and recovery time post op as she demonstrates decrease R shoulder ROM and strength. Quick DASH shows severe disability in performance of home ADLs and community activities. Pt would benefit form skilled PT services post op working on improving proximal strength and shoulder function.   OBJECTIVE IMPAIRMENTS: decreased activity tolerance, decreased endurance, decreased mobility, decreased ROM, decreased strength, postural dysfunction, and pain   ACTIVITY LIMITATIONS: carrying, lifting, reach over head, and hygiene/grooming  PARTICIPATION LIMITATIONS: meal prep, cleaning, driving, shopping, community activity, and yard work  PERSONAL FACTORS: Time since onset of injury/illness/exacerbation and 3+ comorbidities: DM II, bipolar, depression are also affecting patient's functional outcome.   REHAB POTENTIAL: Good  CLINICAL DECISION MAKING: Evolving/moderate complexity  EVALUATION COMPLEXITY: Moderate   GOALS: Goals reviewed with patient? No  SHORT TERM GOALS: Target date: 09/19/2023   Pt will be compliant and knowledgeable with initial HEP for improved comfort and carryover Baseline: initial HEP  given  Goal status: INITIAL  2.  Pt will self report right shoulder pain no greater than 6/10 for improved comfort and functional ability Baseline: 10/10 at worst Goal status: INITIAL   LONG TERM GOALS: Target date: 11/21/2023   Pt will decrease Quick DASH disability score to no greater than 50% as proxy for functional improvement Baseline: 80% disability  Goal status: INITIAL  2.  Pt will self report right shoulder pain no greater than 3/10 for improved comfort and functional ability Baseline: 10/10 at worst Goal status: INITIAL    3.  Pt will improve R shoulder  flex to at least 140 degrees for improved functional with home and community activities  Baseline: see ROM chart Goal status: INITIAL  4.  Pt will improve R shoulder MMT to at least 4/5 for improved functional ability and shoulder stability with OH motion Baseline: DNT Goal status: INITIAL   PLAN:  PT FREQUENCY: 2x/week  PT DURATION: 8 weeks  PLANNED INTERVENTIONS: 97164- PT Re-evaluation, 97110-Therapeutic exercises, 97530- Therapeutic activity, [id number]- Neuromuscular re-education, 97535- Self Care, 02859- Manual therapy, G0283- Electrical stimulation (unattended), [id number]- Electrical stimulation (manual), 97016- Vasopneumatic device, 20560 (1-2 muscles), 20561 (3+ muscles)- Dry Needling, Cryotherapy, and Moist heat  PLAN FOR NEXT SESSION: assess HEP response, progress flexion and strength, improve comfort with OH motion   Alm JAYSON Kingdom, PT 09/13/2023, 8:47 AM

## 2023-09-14 ENCOUNTER — Ambulatory Visit

## 2023-09-14 DIAGNOSIS — M25511 Pain in right shoulder: Secondary | ICD-10-CM

## 2023-09-14 DIAGNOSIS — M6281 Muscle weakness (generalized): Secondary | ICD-10-CM

## 2023-09-14 DIAGNOSIS — R293 Abnormal posture: Secondary | ICD-10-CM

## 2023-09-14 NOTE — Therapy (Signed)
 OUTPATIENT PHYSICAL THERAPY SHOULDER TREATMENT   Patient Name: [name] [name] MRN: [id number] DOB:1975-04-10, 48 y.o., female Today's Date: 09/14/2023  END OF SESSION:  PT End of Session - 09/14/23 0819     Visit Number 4    Number of Visits 17    Date for PT Re-Evaluation 11/21/23    Authorization Type [place] [employer]    PT Start Time 0845    PT Stop Time 0923    PT Time Calculation (min) 38 min            Past Medical History:  Diagnosis Date   Allergy    Anemia    Arthritis    Bipolar 1 disorder (HCC)    Sees Dr. Kermit, psychology,  [phone]   Carpal tunnel syndrome    Depression    Diabetes mellitus without complication (HCC)    Family history of adverse reaction to anesthesia    mother & son have difficulty waking   Fatigue    Ganglion of left wrist 07/26/2018   Lung nodule seen on imaging study 09/24/2013   Peripheral vascular disease (HCC)    Sleep apnea    uses cpap   Past Surgical History:  Procedure Laterality Date   ABDOMINAL HYSTERECTOMY     CHOLECYSTECTOMY N/A 06/22/2017   Procedure: LAPAROSCOPIC CHOLECYSTECTOMY;  Surgeon: [name] Herlene Righter, MD;  Location: [facility];  Service: General;  Laterality: N/A;   I & D EXTREMITY Right 09/14/2015   Procedure: IRRIGATION AND DEBRIDEMENT RIGHT MIDDLE FINGER;  Surgeon: [name] Mussel, MD;  Location: [facility] OR;  Service: Orthopedics;  Laterality: Right;   POSTERIOR LUMBAR FUSION 2 WITH HARDWARE REMOVAL Right 08/15/2023   Procedure: ARTHROSCOPY, SHOULDER WITH DEBRIDEMENT;  Surgeon: [name] Kay HERO, MD;  Location: [facility];  Service: Orthopedics;  Laterality: Right;   TUBAL LIGATION     Patient Active Problem List   Diagnosis Date Noted   Partial nontraumatic tear of right rotator cuff 08/15/2023   Impingement syndrome of right shoulder 08/15/2023   Excessive daytime sleepiness 12/27/2022   Sleep attack 12/27/2022   Nightmare disorder 12/27/2022   Snoring 12/27/2022   Chronic right shoulder pain  11/16/2022   ADHD 01/03/2022   Urine frequency 08/08/2018   OSA on CPAP 02/09/2016   Diabetes mellitus (HCC) 07/11/2012   Pompholyx eczema 11/12/2011   Vitamin D  deficiency 09/20/2007   MENOPAUSE, SURGICAL 04/24/2007   Diaphragmatic hernia 11/20/2006   Hyperlipidemia 05/03/2006   Class 3 severe obesity in adult 05/03/2006   Bipolar I disorder (HCC) 05/03/2006   ANXIETY 05/03/2006   HYPERTENSION, BENIGN SYSTEMIC 05/03/2006   PEPTIC ULCER DIS., UNSPEC. W/O OBSTRUCTION 05/03/2006    PCP: [name] Lynwood NOVAK, MD  REFERRING PROVIDER: Jule Ronal CROME, PA-C  REFERRING DIAG: [phone] (ICD-10-CM) - History of arthroscopy of right shoulder   THERAPY DIAG:  Acute pain of right shoulder  Muscle weakness (generalized)  Abnormal posture  Rationale for Evaluation and Treatment: Rehabilitation  ONSET DATE: 08/15/2023  SUBJECTIVE:  SUBJECTIVE STATEMENT: Pt presents to PT with reports of general soreness, 2/10. Ready to begin PT at this time.  Hand dominance: Right  PERTINENT HISTORY: DM II, bipolar, depression  PAIN:  Are you having pain?  Yes: NPRS scale: 6/10 Worst: 10/10 Pain location: R anterior shoulder Pain description: sharp, sore Aggravating factors: OH reaching, lifting Relieving factors: rest  PRECAUTIONS: avoid resisted elbow flexion  RED FLAGS: None   WEIGHT BEARING RESTRICTIONS: No  FALLS:  Has patient fallen in last 6 months? No  LIVING ENVIRONMENT: Lives with: lives with their family Lives in: House/apartment Stairs: No Has following equipment at home: None  OCCUPATION: Not working  PLOF: Independent  PATIENT GOALS: get back   NEXT MD VISIT: 09/28/2023  OBJECTIVE:  Note: Objective measures were completed at Evaluation unless otherwise noted.  DIAGNOSTIC FINDINGS:   See imaging   PATIENT SURVEYS:  Quick DASH: 80% disability  COGNITION: Overall cognitive status: Within functional limits for tasks assessed     SENSATION: Light touch: Impaired - anterior R UE  POSTURE: Rounded shoulder, large body habitus  UPPER EXTREMITY ROM:   Active ROM Right eval Left eval  Shoulder flexion 60 WFL  Shoulder extension    Shoulder abduction 83 WFL  Shoulder adduction    Shoulder internal rotation  WFL  Shoulder external rotation 25 WFL  Elbow flexion    Elbow extension    Wrist flexion    Wrist extension    Wrist ulnar deviation    Wrist radial deviation    Wrist pronation    Wrist supination    (Blank rows = not tested)  UPPER EXTREMITY MMT:  MMT Right eval Left eval  Shoulder flexion DNT 4  Shoulder extension    Shoulder abduction DNT 4  Shoulder adduction    Shoulder internal rotation DNT 4  Shoulder external rotation DNT   Middle trapezius    Lower trapezius    Elbow flexion    Elbow extension    Wrist flexion    Wrist extension    Wrist ulnar deviation    Wrist radial deviation    Wrist pronation    Wrist supination    Grip strength (lbs)    (Blank rows = not tested)  SHOULDER SPECIAL TESTS: DNT  JOINT MOBILITY TESTING:  GH hypomobility  PALPATION:  TTP to R anterior deltoid, R infraspinatus    TREATMENT: [facility] Adult PT Treatment:                                                DATE: 09/14/23 Supine shoulder flexion x 10 R - increased pain Supine dow flexion 2x10 AAROM Supine dow chest press 2x10 Seated scapular retractions x 10  Pulleys into flexion x 2 min Standing row 2x10 GTB Standing ext 2x10 RTB Seated horizontal abd 2x10 YTB Seated cane ER x 10 AAROM R  [facility] Adult PT Treatment:                                                DATE: 09/13/23 Supine shoulder flexion 2x10 R Supine dow flexion 2x10 AAROM S/L shoulder abd 2x10 R Seated scapular retractions x 10  Standing row 2x10 GTB  Pulleys into flexion x  2 min  R shoulder IR/ER isometric x 10 - 5 hold Seated cane ER 2x10 [name] [name] Adult PT Treatment:                                                DATE: 09/06/23 Therapeutic Exercise: Seated table slides flexion and abduction 10 x 2 each  Supine AAROM shoulder flexion 10 x 2  Supine ER AAROM with dowel  Scap retractions x 15 Passive shoulder flexion, abduction, ER , IR Isometric shoulder ER 3 sec x 10 Isometric shoulder IR 3 sec x 10 Self Care: Discussion of precautions: avoid pain, no lifting (avoid resisted elbow flexion)   [facility] Adult PT Treatment:                                                DATE: 08/29/2023 Therapeutic Exercise: Seated scapular retraction x 5  Seated flexion table slide x 5 R Seated abd table slide x 5 R Supine shoulder flex with L UE support x 5 AAROM R   PATIENT EDUCATION: Education details: eval findings, Quick DASH, HEP, POC Person educated: Patient Education method: Explanation, Demonstration, and Handouts Education comprehension: verbalized understanding and returned demonstration  HOME EXERCISE PROGRAM: Access Code: [email or web address] URL: [email or web address] Date: 09/13/2023 Prepared by: Alm Kingdom  Exercises - Seated Scapular Retraction  - 1-2 x daily - 7 x weekly - 2 sets - 10 reps - 3-5 sec hold - Seated Shoulder Flexion Towel Slide at Table Top  - 1-2 x daily - 7 x weekly - 3 sets - 10 reps - 5 sec hold - Seated Shoulder Abduction Towel Slide at Table Top  - 1-2 x daily - 7 x weekly - 3 sets - 10 reps - 5 sec hold - Supine Shoulder Flexion AAROM with Hands Clasped  - 1-2 x daily - 7 x weekly - 3 sets - 10 reps - 5 sec hold - Standing Isometric Shoulder Internal Rotation at Doorway  - 1 x daily - 7 x weekly - 1 sets - 10 reps - 3-5 hold - Standing Isometric Shoulder External Rotation with Doorway  - 1 x daily - 7 x weekly - 3 sets - 10 reps - 3-5 hold - Supine Shoulder Flexion Extension Full Range AROM  - 1 x daily - 7 x weekly - 2  sets - 10 reps - Sidelying Shoulder Abduction Palm Forward  - 1 x daily - 7 x weekly - 2 sets - 10 reps  ASSESSMENT:  CLINICAL IMPRESSION: Pt was able to complete prescribed exercises but had increased pain today. We continued to progress shoulder function and strength today post op. Pt progressing with therapy, will continue per POC.   EVAL: Patient is a 48 y.o. F who was seen today for physical therapy evaluation and treatment s/p R shoulder arthroscopic debridement, SAD, and biceps tenotomy performed by Dr. Jerri on 08/15/2023. Physical findings are consistent with surgery and recovery time post op as she demonstrates decrease R shoulder ROM and strength. Quick DASH shows severe disability in performance of home ADLs and community activities. Pt would benefit form skilled PT services post op working on improving proximal strength and shoulder function.   OBJECTIVE IMPAIRMENTS: decreased activity tolerance, decreased endurance, decreased  mobility, decreased ROM, decreased strength, postural dysfunction, and pain   ACTIVITY LIMITATIONS: carrying, lifting, reach over head, and hygiene/grooming  PARTICIPATION LIMITATIONS: meal prep, cleaning, driving, shopping, community activity, and yard work  PERSONAL FACTORS: Time since onset of injury/illness/exacerbation and 3+ comorbidities: DM II, bipolar, depression are also affecting patient's functional outcome.   REHAB POTENTIAL: Good  CLINICAL DECISION MAKING: Evolving/moderate complexity  EVALUATION COMPLEXITY: Moderate   GOALS: Goals reviewed with patient? No  SHORT TERM GOALS: Target date: 09/19/2023   Pt will be compliant and knowledgeable with initial HEP for improved comfort and carryover Baseline: initial HEP given  Goal status: MET  2.  Pt will self report right shoulder pain no greater than 6/10 for improved comfort and functional ability Baseline: 10/10 at worst Goal status: IN PROGRESS   LONG TERM GOALS: Target date:  11/21/2023   Pt will decrease Quick DASH disability score to no greater than 50% as proxy for functional improvement Baseline: 80% disability  Goal status: INITIAL  2.  Pt will self report right shoulder pain no greater than 3/10 for improved comfort and functional ability Baseline: 10/10 at worst Goal status: INITIAL    3.  Pt will improve R shoulder flex to at least 140 degrees for improved functional with home and community activities  Baseline: see ROM chart Goal status: INITIAL  4.  Pt will improve R shoulder MMT to at least 4/5 for improved functional ability and shoulder stability with OH motion Baseline: DNT Goal status: INITIAL   PLAN:  PT FREQUENCY: 2x/week  PT DURATION: 8 weeks  PLANNED INTERVENTIONS: 97164- PT Re-evaluation, 97110-Therapeutic exercises, 97530- Therapeutic activity, [id number]- Neuromuscular re-education, 97535- Self Care, 02859- Manual therapy, G0283- Electrical stimulation (unattended), [id number]- Electrical stimulation (manual), 97016- Vasopneumatic device, 20560 (1-2 muscles), 20561 (3+ muscles)- Dry Needling, Cryotherapy, and Moist heat  PLAN FOR NEXT SESSION: assess HEP response, progress flexion and strength, improve comfort with OH motion   Alm JAYSON Kingdom, PT 09/14/2023, 9:33 AM

## 2023-09-18 ENCOUNTER — Ambulatory Visit

## 2023-09-18 NOTE — Therapy (Incomplete)
 OUTPATIENT PHYSICAL THERAPY SHOULDER TREATMENT   Patient Name: [name] [name] MRN: [id number] DOB:[birth year], 48 y.o., female Today's Date: 09/18/2023  END OF SESSION:      Past Medical History:  Diagnosis Date   Allergy    Anemia    Arthritis    Bipolar 1 disorder (HCC)    Sees Dr. Kermit, psychology,  [phone]   Carpal tunnel syndrome    Depression    Diabetes mellitus without complication [facility])    Family history of adverse reaction to anesthesia    mother & son have difficulty waking   Fatigue    Ganglion of left wrist 07/26/2018   Lung nodule seen on imaging study 09/24/2013   Peripheral vascular disease (HCC)    Sleep apnea    uses cpap   Past Surgical History:  Procedure Laterality Date   ABDOMINAL HYSTERECTOMY     CHOLECYSTECTOMY N/A 06/22/2017   Procedure: LAPAROSCOPIC CHOLECYSTECTOMY;  Surgeon: Stevie Herlene Righter, MD;  Location: [facility];  Service: General;  Laterality: N/A;   I & D EXTREMITY Right 09/14/2015   Procedure: IRRIGATION AND DEBRIDEMENT RIGHT MIDDLE FINGER;  Surgeon: Elsie Mussel, MD;  Location: [facility];  Service: Orthopedics;  Laterality: Right;   POSTERIOR LUMBAR FUSION 2 WITH HARDWARE REMOVAL Right 08/15/2023   Procedure: ARTHROSCOPY, SHOULDER WITH DEBRIDEMENT;  Surgeon: Jerri Kay HERO, MD;  Location: [facility];  Service: Orthopedics;  Laterality: Right;   TUBAL LIGATION     Patient Active Problem List   Diagnosis Date Noted   Partial nontraumatic tear of right rotator cuff 08/15/2023   Impingement syndrome of right shoulder 08/15/2023   Excessive daytime sleepiness 12/27/2022   Sleep attack 12/27/2022   Nightmare disorder 12/27/2022   Snoring 12/27/2022   Chronic right shoulder pain 11/16/2022   ADHD 01/03/2022   Urine frequency 08/08/2018   OSA on CPAP 02/09/2016   Diabetes mellitus (HCC) 07/11/2012   Pompholyx eczema 11/12/2011   Vitamin D  deficiency 09/20/2007   MENOPAUSE, SURGICAL 04/24/2007   Diaphragmatic  hernia 11/20/2006   Hyperlipidemia 05/03/2006   Class 3 severe obesity in adult 05/03/2006   Bipolar I disorder (HCC) 05/03/2006   ANXIETY 05/03/2006   HYPERTENSION, BENIGN SYSTEMIC 05/03/2006   PEPTIC ULCER DIS., UNSPEC. W/O OBSTRUCTION 05/03/2006    PCP: Marlee Lynwood NOVAK, MD  REFERRING PROVIDER: Jule Ronal CROME, PA-C  REFERRING DIAG: [phone] (ICD-10-CM) - History of arthroscopy of right shoulder   THERAPY DIAG:  No diagnosis found.  Rationale for Evaluation and Treatment: Rehabilitation  ONSET DATE: 08/15/2023  SUBJECTIVE:  SUBJECTIVE STATEMENT: Pt presents to PT with reports of general soreness, 2/10. Ready to begin PT at this time.  Hand dominance: Right  PERTINENT HISTORY: DM II, bipolar, depression  PAIN:  Are you having pain?  Yes: NPRS scale: 6/10 Worst: 10/10 Pain location: R anterior shoulder Pain description: sharp, sore Aggravating factors: OH reaching, lifting Relieving factors: rest  PRECAUTIONS: avoid resisted elbow flexion  RED FLAGS: None   WEIGHT BEARING RESTRICTIONS: No  FALLS:  Has patient fallen in last 6 months? No  LIVING ENVIRONMENT: Lives with: lives with their family Lives in: House/apartment Stairs: No Has following equipment at home: None  OCCUPATION: Not working  PLOF: Independent  PATIENT GOALS: get back   NEXT MD VISIT: 09/28/2023  OBJECTIVE:  Note: Objective measures were completed at Evaluation unless otherwise noted.  DIAGNOSTIC FINDINGS:  See imaging   PATIENT SURVEYS:  Quick DASH: 80% disability  COGNITION: Overall cognitive status: Within functional limits for tasks assessed     SENSATION: Light touch: Impaired - anterior R UE  POSTURE: Rounded shoulder, large body habitus  UPPER EXTREMITY ROM:   Active ROM Right eval  Left eval  Shoulder flexion 60 WFL  Shoulder extension    Shoulder abduction 83 WFL  Shoulder adduction    Shoulder internal rotation  WFL  Shoulder external rotation 25 WFL  Elbow flexion    Elbow extension    Wrist flexion    Wrist extension    Wrist ulnar deviation    Wrist radial deviation    Wrist pronation    Wrist supination    (Blank rows = not tested)  UPPER EXTREMITY MMT:  MMT Right eval Left eval  Shoulder flexion DNT 4  Shoulder extension    Shoulder abduction DNT 4  Shoulder adduction    Shoulder internal rotation DNT 4  Shoulder external rotation DNT   Middle trapezius    Lower trapezius    Elbow flexion    Elbow extension    Wrist flexion    Wrist extension    Wrist ulnar deviation    Wrist radial deviation    Wrist pronation    Wrist supination    Grip strength (lbs)    (Blank rows = not tested)  SHOULDER SPECIAL TESTS: DNT  JOINT MOBILITY TESTING:  GH hypomobility  PALPATION:  TTP to R anterior deltoid, R infraspinatus    TREATMENT: [facility] Adult PT Treatment:                                                DATE: 09/14/23 Supine shoulder flexion x 10 R - increased pain Supine dow flexion 2x10 AAROM Supine dow chest press 2x10 Seated scapular retractions x 10  Pulleys into flexion x 2 min Standing row 2x10 GTB Standing ext 2x10 RTB Seated horizontal abd 2x10 YTB Seated cane ER x 10 AAROM R  [facility] Adult PT Treatment:                                                DATE: 09/13/23 Supine shoulder flexion 2x10 R Supine dow flexion 2x10 AAROM S/L shoulder abd 2x10 R Seated scapular retractions x 10  Standing row 2x10 GTB  Pulleys into flexion x 2 min R  shoulder IR/ER isometric x 10 - 5 hold Seated cane ER 2x10 [name] [name] Adult PT Treatment:                                                DATE: 09/06/23 Therapeutic Exercise: Seated table slides flexion and abduction 10 x 2 each  Supine AAROM shoulder flexion 10 x 2  Supine ER AAROM with  dowel  Scap retractions x 15 Passive shoulder flexion, abduction, ER , IR Isometric shoulder ER 3 sec x 10 Isometric shoulder IR 3 sec x 10 Self Care: Discussion of precautions: avoid pain, no lifting (avoid resisted elbow flexion)   [facility] Adult PT Treatment:                                                DATE: 08/29/2023 Therapeutic Exercise: Seated scapular retraction x 5  Seated flexion table slide x 5 R Seated abd table slide x 5 R Supine shoulder flex with L UE support x 5 AAROM R   PATIENT EDUCATION: Education details: eval findings, Quick DASH, HEP, POC Person educated: Patient Education method: Explanation, Demonstration, and Handouts Education comprehension: verbalized understanding and returned demonstration  HOME EXERCISE PROGRAM: Access Code: [email or web address] URL: [email or web address] Date: 09/13/2023 Prepared by: Alm Kingdom  Exercises - Seated Scapular Retraction  - 1-2 x daily - 7 x weekly - 2 sets - 10 reps - 3-5 sec hold - Seated Shoulder Flexion Towel Slide at Table Top  - 1-2 x daily - 7 x weekly - 3 sets - 10 reps - 5 sec hold - Seated Shoulder Abduction Towel Slide at Table Top  - 1-2 x daily - 7 x weekly - 3 sets - 10 reps - 5 sec hold - Supine Shoulder Flexion AAROM with Hands Clasped  - 1-2 x daily - 7 x weekly - 3 sets - 10 reps - 5 sec hold - Standing Isometric Shoulder Internal Rotation at Doorway  - 1 x daily - 7 x weekly - 1 sets - 10 reps - 3-5 hold - Standing Isometric Shoulder External Rotation with Doorway  - 1 x daily - 7 x weekly - 3 sets - 10 reps - 3-5 hold - Supine Shoulder Flexion Extension Full Range AROM  - 1 x daily - 7 x weekly - 2 sets - 10 reps - Sidelying Shoulder Abduction Palm Forward  - 1 x daily - 7 x weekly - 2 sets - 10 reps  ASSESSMENT:  CLINICAL IMPRESSION: Pt was able to complete prescribed exercises but had increased pain today. We continued to progress shoulder function and strength today post op. Pt progressing  with therapy, will continue per POC.   EVAL: Patient is a 48 y.o. F who was seen today for physical therapy evaluation and treatment s/p R shoulder arthroscopic debridement, SAD, and biceps tenotomy performed by Dr. Jerri on 08/15/2023. Physical findings are consistent with surgery and recovery time post op as she demonstrates decrease R shoulder ROM and strength. Quick DASH shows severe disability in performance of home ADLs and community activities. Pt would benefit form skilled PT services post op working on improving proximal strength and shoulder function.   OBJECTIVE IMPAIRMENTS: decreased activity tolerance, decreased endurance, decreased  mobility, decreased ROM, decreased strength, postural dysfunction, and pain   ACTIVITY LIMITATIONS: carrying, lifting, reach over head, and hygiene/grooming  PARTICIPATION LIMITATIONS: meal prep, cleaning, driving, shopping, community activity, and yard work  PERSONAL FACTORS: Time since onset of injury/illness/exacerbation and 3+ comorbidities: DM II, bipolar, depression are also affecting patient's functional outcome.   REHAB POTENTIAL: Good  CLINICAL DECISION MAKING: Evolving/moderate complexity  EVALUATION COMPLEXITY: Moderate   GOALS: Goals reviewed with patient? No  SHORT TERM GOALS: Target date: 09/19/2023   Pt will be compliant and knowledgeable with initial HEP for improved comfort and carryover Baseline: initial HEP given  Goal status: MET  2.  Pt will self report right shoulder pain no greater than 6/10 for improved comfort and functional ability Baseline: 10/10 at worst Goal status: IN PROGRESS   LONG TERM GOALS: Target date: 11/21/2023   Pt will decrease Quick DASH disability score to no greater than 50% as proxy for functional improvement Baseline: 80% disability  Goal status: INITIAL  2.  Pt will self report right shoulder pain no greater than 3/10 for improved comfort and functional ability Baseline: 10/10 at worst Goal  status: INITIAL    3.  Pt will improve R shoulder flex to at least 140 degrees for improved functional with home and community activities  Baseline: see ROM chart Goal status: INITIAL  4.  Pt will improve R shoulder MMT to at least 4/5 for improved functional ability and shoulder stability with OH motion Baseline: DNT Goal status: INITIAL   PLAN:  PT FREQUENCY: 2x/week  PT DURATION: 8 weeks  PLANNED INTERVENTIONS: 97164- PT Re-evaluation, 97110-Therapeutic exercises, 97530- Therapeutic activity, [id number]- Neuromuscular re-education, 97535- Self Care, 02859- Manual therapy, G0283- Electrical stimulation (unattended), [id number]- Electrical stimulation (manual), 97016- Vasopneumatic device, 20560 (1-2 muscles), 20561 (3+ muscles)- Dry Needling, Cryotherapy, and Moist heat  PLAN FOR NEXT SESSION: assess HEP response, progress flexion and strength, improve comfort with OH motion   Alm JAYSON Kingdom, PT 09/18/2023, 7:48 AM

## 2023-09-20 ENCOUNTER — Encounter: Admitting: Physical Therapy

## 2023-09-21 ENCOUNTER — Other Ambulatory Visit (HOSPITAL_COMMUNITY): Payer: Self-pay

## 2023-09-21 ENCOUNTER — Ambulatory Visit

## 2023-09-21 VITALS — BP 118/82 | HR 100 | Wt 297.6 lb

## 2023-09-21 DIAGNOSIS — R0600 Dyspnea, unspecified: Secondary | ICD-10-CM | POA: Diagnosis present

## 2023-09-21 DIAGNOSIS — E785 Hyperlipidemia, unspecified: Secondary | ICD-10-CM | POA: Diagnosis not present

## 2023-09-21 DIAGNOSIS — F319 Bipolar disorder, unspecified: Secondary | ICD-10-CM | POA: Diagnosis not present

## 2023-09-21 DIAGNOSIS — R293 Abnormal posture: Secondary | ICD-10-CM

## 2023-09-21 DIAGNOSIS — E11628 Type 2 diabetes mellitus with other skin complications: Secondary | ICD-10-CM

## 2023-09-21 DIAGNOSIS — G4733 Obstructive sleep apnea (adult) (pediatric): Secondary | ICD-10-CM

## 2023-09-21 DIAGNOSIS — M6281 Muscle weakness (generalized): Secondary | ICD-10-CM

## 2023-09-21 DIAGNOSIS — Z794 Long term (current) use of insulin: Secondary | ICD-10-CM

## 2023-09-21 DIAGNOSIS — M25511 Pain in right shoulder: Secondary | ICD-10-CM

## 2023-09-21 DIAGNOSIS — Z0000 Encounter for general adult medical examination without abnormal findings: Secondary | ICD-10-CM | POA: Insufficient documentation

## 2023-09-21 MED ORDER — ROSUVASTATIN CALCIUM 10 MG PO TABS
10.0000 mg | ORAL_TABLET | Freq: Every day | ORAL | 3 refills | Status: AC
Start: 2023-09-21 — End: ?
  Filled 2023-09-21: qty 90, 90d supply, fill #0
  Filled 2023-12-25 (×2): qty 90, 90d supply, fill #1

## 2023-09-21 NOTE — Assessment & Plan Note (Signed)
-   Pt will follow up with her neurologist about CPAP

## 2023-09-21 NOTE — Assessment & Plan Note (Signed)
-   Pt is currently using Ozempic  0.5 and will continue titrating up - Recheck A1c in 6 weeks - Starting statin today

## 2023-09-21 NOTE — Progress Notes (Signed)
    SUBJECTIVE:   CHIEF COMPLAINT / HPI:   Dyspnea: Pt reporting transient shortness of breath now immediately after 2 separate procedures (shoulder arthroscopy and cholecystectomy).. She is also endorsing mild intermittent palpitations. Not currently symptomatic. She is interested in seeing a cardiologist. Has never had an echo.  OSA: Pt complaining of increased tiredness during the day. She is using her CPAP and Provigil  and needs to schedule a visit with her neurologist.  Bipolar: Pt is stable on lamotrigine  and venlafaxine . Curious about if she can stop the medications. No thoughts of harming self or others.  T2DM: Pt currently on metformin  XR 1000 mg twice a day and wegovy  0.5 weekly. Last HbA1c on 5/13 was 7.2.   Hyperlipidemia: Last lipid panel showed elevated cholesterol. Pt amenable to starting statin.  PERTINENT  PMH / PSH:  Bipolar disorder Type 2 diabetes Hyperlipidemia Obstructive sleep apnea on CPAP  OBJECTIVE:   BP 118/82   Pulse 100   Wt 297 lb 9.6 oz (135 kg)   SpO2 98%   BMI 45.25 kg/m    General: Well appearing HEENT: Normocephalic, atraumatic CV: Well-perfused, mild edema in lower extremities Pulm: Normal work of breathing on room air Psych: Normal affect  ASSESSMENT/PLAN:   Assessment & Plan Dyspnea, unspecified type Transient post-operative dyspnea of unclear etiology, would benefit of structural assessment of heart and referral to cardiology - Referred to cardiology - Ordered Echo OSA on CPAP - Pt will follow up with her neurologist about CPAP Bipolar I disorder (HCC) - Currently stable, encouraged pt to continue taking her medications to prevent re-emergence of mania Type 2 diabetes mellitus with other skin complication, with long-term current use of insulin  (HCC) - Pt is currently using Ozempic  0.5 and will continue titrating up - Recheck A1c in 6 weeks - Starting statin today Hyperlipidemia, unspecified hyperlipidemia type  - Started  Crestor 10mg  daily - Recheck lipid panel in 6 weeks     Darren Jernigan, DO [facility]

## 2023-09-21 NOTE — Assessment & Plan Note (Signed)
-   Started Crestor 10mg  daily - Recheck lipid panel in 6 weeks

## 2023-09-21 NOTE — Patient Instructions (Addendum)
-   Referring to cardiology - Setting up an ultrasound of your heart - Follow up in 6 weeks for labs with me - Start Crestor 10 mg every day for cholesterol - call your neurologist's office to schedule a follow up about your CPAP

## 2023-09-21 NOTE — Assessment & Plan Note (Signed)
-   Currently stable, encouraged pt to continue taking her medications to prevent re-emergence of mania

## 2023-09-21 NOTE — Therapy (Signed)
 OUTPATIENT PHYSICAL THERAPY SHOULDER TREATMENT   Patient Name: [name] [name] MRN: [id number] DOB:Jul 15, 1975, 48 y.o., female Today's Date: 09/21/2023  END OF SESSION:  PT End of Session - 09/21/23 1023     Visit Number 5    Number of Visits 17    Date for PT Re-Evaluation 11/21/23    Authorization Type [place] [employer]    PT Start Time 0845    PT Stop Time 0928    PT Time Calculation (min) 43 min    Activity Tolerance Patient tolerated treatment well    Behavior During Therapy [facility] for tasks assessed/performed             Past Medical History:  Diagnosis Date   Allergy    Anemia    Arthritis    Bipolar 1 disorder (HCC)    Sees Dr. Kermit, psychology,  [phone]   Carpal tunnel syndrome    Depression    Diabetes mellitus without complication (HCC)    Family history of adverse reaction to anesthesia    mother & son have difficulty waking   Fatigue    Ganglion of left wrist 07/26/2018   Lung nodule seen on imaging study 09/24/2013   Peripheral vascular disease (HCC)    Sleep apnea    uses cpap   Past Surgical History:  Procedure Laterality Date   ABDOMINAL HYSTERECTOMY     CHOLECYSTECTOMY N/A 06/22/2017   Procedure: LAPAROSCOPIC CHOLECYSTECTOMY;  Surgeon: Stevie Herlene Righter, MD;  Location: [facility] OR;  Service: General;  Laterality: N/A;   I & D EXTREMITY Right 09/14/2015   Procedure: IRRIGATION AND DEBRIDEMENT RIGHT MIDDLE FINGER;  Surgeon: Elsie Mussel, MD;  Location: [facility] OR;  Service: Orthopedics;  Laterality: Right;   POSTERIOR LUMBAR FUSION 2 WITH HARDWARE REMOVAL Right 08/15/2023   Procedure: ARTHROSCOPY, SHOULDER WITH DEBRIDEMENT;  Surgeon: Jerri Kay HERO, MD;  Location: [facility];  Service: Orthopedics;  Laterality: Right;   TUBAL LIGATION     Patient Active Problem List   Diagnosis Date Noted   Partial nontraumatic tear of right rotator cuff 08/15/2023   Impingement syndrome of right shoulder 08/15/2023   Excessive daytime sleepiness  12/27/2022   Sleep attack 12/27/2022   Nightmare disorder 12/27/2022   Snoring 12/27/2022   Chronic right shoulder pain 11/16/2022   ADHD 01/03/2022   Urine frequency 08/08/2018   OSA on CPAP 02/09/2016   Diabetes mellitus (HCC) 07/11/2012   Pompholyx eczema 11/12/2011   Vitamin D  deficiency 09/20/2007   MENOPAUSE, SURGICAL 04/24/2007   Diaphragmatic hernia 11/20/2006   Hyperlipidemia 05/03/2006   Class 3 severe obesity in adult 05/03/2006   Bipolar I disorder (HCC) 05/03/2006   ANXIETY 05/03/2006   HYPERTENSION, BENIGN SYSTEMIC 05/03/2006   PEPTIC ULCER DIS., UNSPEC. W/O OBSTRUCTION 05/03/2006    PCP: Marlee Lynwood NOVAK, MD  REFERRING PROVIDER: Jule Ronal CROME, PA-C  REFERRING DIAG: [phone] (ICD-10-CM) - History of arthroscopy of right shoulder   THERAPY DIAG:  No diagnosis found.  Rationale for Evaluation and Treatment: Rehabilitation  ONSET DATE: 08/15/2023  SUBJECTIVE:  SUBJECTIVE STATEMENT: Pt comes in feeling good but has not been completeing her HEP d/t recent increase in shoulder pain. Pain started to diminish a few days ago and felt good yesterday.  Hand dominance: Right  PERTINENT HISTORY: DM II, bipolar, depression  PAIN:  Are you having pain?  Yes: NPRS scale: 6/10 Worst: 10/10 Pain location: R anterior shoulder Pain description: sharp, sore Aggravating factors: OH reaching, lifting Relieving factors: rest  PRECAUTIONS: avoid resisted elbow flexion  RED FLAGS: None   WEIGHT BEARING RESTRICTIONS: No  FALLS:  Has patient fallen in last 6 months? No  LIVING ENVIRONMENT: Lives with: lives with their family Lives in: House/apartment Stairs: No Has following equipment at home: None  OCCUPATION: Not working  PLOF: Independent  PATIENT GOALS: get back   NEXT MD  VISIT: 09/28/2023  OBJECTIVE:  Note: Objective measures were completed at Evaluation unless otherwise noted.  DIAGNOSTIC FINDINGS:  See imaging   PATIENT SURVEYS:  Quick DASH: 80% disability  COGNITION: Overall cognitive status: Within functional limits for tasks assessed     SENSATION: Light touch: Impaired - anterior R UE  POSTURE: Rounded shoulder, large body habitus  UPPER EXTREMITY ROM:   Active ROM Right eval Left eval  Shoulder flexion 60 WFL  Shoulder extension    Shoulder abduction 83 WFL  Shoulder adduction    Shoulder internal rotation  WFL  Shoulder external rotation 25 WFL  Elbow flexion    Elbow extension    Wrist flexion    Wrist extension    Wrist ulnar deviation    Wrist radial deviation    Wrist pronation    Wrist supination    (Blank rows = not tested)  UPPER EXTREMITY MMT:  MMT Right eval Left eval  Shoulder flexion DNT 4  Shoulder extension    Shoulder abduction DNT 4  Shoulder adduction    Shoulder internal rotation DNT 4  Shoulder external rotation DNT   Middle trapezius    Lower trapezius    Elbow flexion    Elbow extension    Wrist flexion    Wrist extension    Wrist ulnar deviation    Wrist radial deviation    Wrist pronation    Wrist supination    Grip strength (lbs)    (Blank rows = not tested)  SHOULDER SPECIAL TESTS: DNT  JOINT MOBILITY TESTING:  GH hypomobility  PALPATION:  TTP to R anterior deltoid, R infraspinatus    TREATMENT: [facility] Adult PT Treatment:                                                DATE: 09/21/23 Therapeutic Exercise Supine shoulder flexion x 10 R caused pain limited range at end  Supine dow flexion 3x10 AAROM Supine dow chest press 3x10 Seated scapular retractions 2x 10 Seated RTB pull aparts 2x10 Seated single arm RTB row Pulleys into flexion x3 min Standing row 2x10 YTB Standing ext 2x10 YTB Standing ER 2x10 YTB Standing IR 2x10 YTB  [facility] Adult PT Treatment:                                                 DATE: 09/14/23 Supine shoulder flexion x 10 R - increased pain  Supine dow flexion 2x10 AAROM Supine dow chest press 2x10 Seated scapular retractions x 10  Pulleys into flexion x 2 min Standing row 2x10 GTB Standing ext 2x10 RTB Seated horizontal abd 2x10 YTB Seated cane ER x 10 AAROM R  [facility] Adult PT Treatment:                                                DATE: 09/13/23 Supine shoulder flexion 2x10 R Supine dow flexion 2x10 AAROM S/L shoulder abd 2x10 R Seated scapular retractions x 10  Standing row 2x10 GTB  Pulleys into flexion x 2 min R shoulder IR/ER isometric x 10 - 5 hold Seated cane ER 2x10 [name] Adult PT Treatment:                                                DATE: 09/06/23 Therapeutic Exercise: Seated table slides flexion and abduction 10 x 2 each  Supine AAROM shoulder flexion 10 x 2  Supine ER AAROM with dowel  Scap retractions x 15 Passive shoulder flexion, abduction, ER , IR Isometric shoulder ER 3 sec x 10 Isometric shoulder IR 3 sec x 10 Self Care: Discussion of precautions: avoid pain, no lifting (avoid resisted elbow flexion)   [facility] Adult PT Treatment:                                                DATE: 08/29/2023 Therapeutic Exercise: Seated scapular retraction x 5  Seated flexion table slide x 5 R Seated abd table slide x 5 R Supine shoulder flex with L UE support x 5 AAROM R   PATIENT EDUCATION: Education details: eval findings, Quick DASH, HEP, POC Person educated: Patient Education method: Explanation, Demonstration, and Handouts Education comprehension: verbalized understanding and returned demonstration  HOME EXERCISE PROGRAM: Access Code: [email or web address] URL: [email or web address] Date: 09/13/2023 Prepared by: Alm Kingdom  Exercises - Seated Scapular Retraction  - 1-2 x daily - 7 x weekly - 2 sets - 10 reps - 3-5 sec hold - Seated Shoulder Flexion Towel Slide at Table Top  - 1-2 x daily - 7 x  weekly - 3 sets - 10 reps - 5 sec hold - Seated Shoulder Abduction Towel Slide at Table Top  - 1-2 x daily - 7 x weekly - 3 sets - 10 reps - 5 sec hold - Supine Shoulder Flexion AAROM with Hands Clasped  - 1-2 x daily - 7 x weekly - 3 sets - 10 reps - 5 sec hold - Standing Isometric Shoulder Internal Rotation at Doorway  - 1 x daily - 7 x weekly - 1 sets - 10 reps - 3-5 hold - Standing Isometric Shoulder External Rotation with Doorway  - 1 x daily - 7 x weekly - 3 sets - 10 reps - 3-5 hold - Supine Shoulder Flexion Extension Full Range AROM  - 1 x daily - 7 x weekly - 2 sets - 10 reps - Sidelying Shoulder Abduction Palm Forward  - 1 x daily - 7 x weekly - 2 sets - 10 reps  ASSESSMENT:  CLINICAL IMPRESSION: Pt reports to clinic in minimal pain after several days of significant pain that began before last session. Pt took some time off of HEP d/t pain and did not take anti inflammatory meds to see how quickly pain could subside. Initially, AROM flexion of shoulder in supine caused significant pain, pt unable to complete 10 reps reaching 90 deg. Adding the dowel for AAROM reduced symptoms and patient was able to complete with good range. Pt able to complete all banded exercises without complication. Plan to target flexion exercises and progress band level.  EVAL: Patient is a 47 y.o. F who was seen today for physical therapy evaluation and treatment s/p R shoulder arthroscopic debridement, SAD, and biceps tenotomy performed by Dr. Jerri on 08/15/2023. Physical findings are consistent with surgery and recovery time post op as she demonstrates decrease R shoulder ROM and strength. Quick DASH shows severe disability in performance of home ADLs and community activities. Pt would benefit form skilled PT services post op working on improving proximal strength and shoulder function.   OBJECTIVE IMPAIRMENTS: decreased activity tolerance, decreased endurance, decreased mobility, decreased ROM, decreased strength,  postural dysfunction, and pain   ACTIVITY LIMITATIONS: carrying, lifting, reach over head, and hygiene/grooming  PARTICIPATION LIMITATIONS: meal prep, cleaning, driving, shopping, community activity, and yard work  PERSONAL FACTORS: Time since onset of injury/illness/exacerbation and 3+ comorbidities: DM II, bipolar, depression are also affecting patient's functional outcome.   REHAB POTENTIAL: Good  CLINICAL DECISION MAKING: Evolving/moderate complexity  EVALUATION COMPLEXITY: Moderate   GOALS: Goals reviewed with patient? No  SHORT TERM GOALS: Target date: 09/19/2023   Pt will be compliant and knowledgeable with initial HEP for improved comfort and carryover Baseline: initial HEP given  Goal status: MET  2.  Pt will self report right shoulder pain no greater than 6/10 for improved comfort and functional ability Baseline: 10/10 at worst Goal status: IN PROGRESS   LONG TERM GOALS: Target date: 11/21/2023   Pt will decrease Quick DASH disability score to no greater than 50% as proxy for functional improvement Baseline: 80% disability  Goal status: INITIAL  2.  Pt will self report right shoulder pain no greater than 3/10 for improved comfort and functional ability Baseline: 10/10 at worst Goal status: INITIAL    3.  Pt will improve R shoulder flex to at least 140 degrees for improved functional with home and community activities  Baseline: see ROM chart Goal status: INITIAL  4.  Pt will improve R shoulder MMT to at least 4/5 for improved functional ability and shoulder stability with OH motion Baseline: DNT Goal status: INITIAL   PLAN:  PT FREQUENCY: 2x/week  PT DURATION: 8 weeks  PLANNED INTERVENTIONS: 97164- PT Re-evaluation, 97110-Therapeutic exercises, 97530- Therapeutic activity, [id number]- Neuromuscular re-education, 97535- Self Care, 02859- Manual therapy, [id number]- Electrical stimulation (unattended), [id number]- Electrical stimulation (manual), 97016- Vasopneumatic  device, 20560 (1-2 muscles), 20561 (3+ muscles)- Dry Needling, Cryotherapy, and Moist heat  PLAN FOR NEXT SESSION: assess HEP response, progress flexion and strength, improve comfort with OH motion   [employer], Student-PT 09/21/2023, 10:26 AM

## 2023-09-24 ENCOUNTER — Encounter: Payer: Self-pay | Admitting: Physical Therapy

## 2023-09-24 ENCOUNTER — Other Ambulatory Visit: Payer: Self-pay

## 2023-09-24 ENCOUNTER — Ambulatory Visit: Admitting: Physical Therapy

## 2023-09-24 DIAGNOSIS — G8929 Other chronic pain: Secondary | ICD-10-CM

## 2023-09-24 DIAGNOSIS — M6281 Muscle weakness (generalized): Secondary | ICD-10-CM

## 2023-09-24 DIAGNOSIS — M25511 Pain in right shoulder: Secondary | ICD-10-CM | POA: Diagnosis not present

## 2023-09-24 NOTE — Therapy (Signed)
 OUTPATIENT PHYSICAL THERAPY SHOULDER TREATMENT   Patient Name: [name] [name] MRN: [id number] DOB:December 10, 1975, 48 y.o., female Today's Date: 09/24/2023  END OF SESSION:  PT End of Session - 09/24/23 0721     Visit Number 6    Number of Visits 17    Date for PT Re-Evaluation 11/21/23    Authorization Type [place] [employer]    PT Start Time 0718    PT Stop Time 0757    PT Time Calculation (min) 39 min             Past Medical History:  Diagnosis Date   Allergy    Anemia    Arthritis    Bipolar 1 disorder (HCC)    Sees Dr. Kermit, psychology,  [phone]   Carpal tunnel syndrome    Depression    Diabetes mellitus without complication (HCC)    Family history of adverse reaction to anesthesia    mother & son have difficulty waking   Fatigue    Ganglion of left wrist 07/26/2018   Lung nodule seen on imaging study 09/24/2013   Peripheral vascular disease (HCC)    Sleep apnea    uses cpap   Past Surgical History:  Procedure Laterality Date   ABDOMINAL HYSTERECTOMY     CHOLECYSTECTOMY N/A 06/22/2017   Procedure: LAPAROSCOPIC CHOLECYSTECTOMY;  Surgeon: Stevie Herlene Righter, MD;  Location: [facility] OR;  Service: General;  Laterality: N/A;   I & D EXTREMITY Right 09/14/2015   Procedure: IRRIGATION AND DEBRIDEMENT RIGHT MIDDLE FINGER;  Surgeon: Elsie Mussel, MD;  Location: [facility] OR;  Service: Orthopedics;  Laterality: Right;   POSTERIOR LUMBAR FUSION 2 WITH HARDWARE REMOVAL Right 08/15/2023   Procedure: ARTHROSCOPY, SHOULDER WITH DEBRIDEMENT;  Surgeon: Jerri Kay HERO, MD;  Location: [facility];  Service: Orthopedics;  Laterality: Right;   TUBAL LIGATION     Patient Active Problem List   Diagnosis Date Noted   Routine adult health maintenance 09/21/2023   Partial nontraumatic tear of right rotator cuff 08/15/2023   Impingement syndrome of right shoulder 08/15/2023   Excessive daytime sleepiness 12/27/2022   Sleep attack 12/27/2022   Nightmare disorder 12/27/2022    Snoring 12/27/2022   Chronic right shoulder pain 11/16/2022   ADHD 01/03/2022   Urine frequency 08/08/2018   OSA on CPAP 02/09/2016   Diabetes mellitus (HCC) 07/11/2012   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 W/O OBSTRUCTION 05/03/2006    PCP: Marlee Lynwood NOVAK, MD  REFERRING PROVIDER: Jule Ronal CROME, PA-C  REFERRING DIAG: [phone] (ICD-10-CM) - History of arthroscopy of right shoulder   THERAPY DIAG:  Acute pain of right shoulder  Muscle weakness (generalized)  Rationale for Evaluation and Treatment: Rehabilitation  ONSET DATE: 08/15/2023  SUBJECTIVE:  SUBJECTIVE STATEMENT: Pt reports shoulder is aggravated and she thinks it might be related to her cell phone use.   Hand dominance: Right  PERTINENT HISTORY: DM II, bipolar, depression  PAIN:  Are you having pain?  Yes: NPRS scale: 6/10 Worst: 10/10 Pain location: R anterior shoulder Pain description: sharp, sore Aggravating factors: OH reaching, lifting Relieving factors: rest  PRECAUTIONS: avoid resisted elbow flexion  RED FLAGS: None   WEIGHT BEARING RESTRICTIONS: No  FALLS:  Has patient fallen in last 6 months? No  LIVING ENVIRONMENT: Lives with: lives with their family Lives in: House/apartment Stairs: No Has following equipment at home: None  OCCUPATION: Not working  PLOF: Independent  PATIENT GOALS: get back   NEXT MD VISIT: 09/28/2023  OBJECTIVE:  Note: Objective measures were completed at Evaluation unless otherwise noted.  DIAGNOSTIC FINDINGS:  See imaging    PATIENT SURVEYS:  Quick DASH: 80% disability  COGNITION: Overall cognitive status: Within functional limits for tasks assessed     SENSATION: Light touch: Impaired - anterior R UE  POSTURE: Rounded shoulder, large body habitus  UPPER EXTREMITY ROM:   Active ROM Right eval Left eval  Shoulder flexion 60 WFL  Shoulder extension    Shoulder abduction 83 WFL  Shoulder adduction    Shoulder internal rotation  WFL  Shoulder external rotation 25 WFL  Elbow flexion    Elbow extension    Wrist flexion    Wrist extension    Wrist ulnar deviation    Wrist radial deviation    Wrist pronation    Wrist supination    (Blank rows = not tested)  UPPER EXTREMITY MMT:  MMT Right eval Left eval  Shoulder flexion DNT 4  Shoulder extension    Shoulder abduction DNT 4  Shoulder adduction    Shoulder internal rotation DNT 4  Shoulder external rotation DNT   Middle trapezius    Lower trapezius    Elbow flexion    Elbow extension    Wrist flexion    Wrist extension    Wrist ulnar deviation    Wrist radial deviation    Wrist pronation    Wrist supination    Grip strength (lbs)    (Blank rows = not tested)  SHOULDER SPECIAL TESTS: DNT  JOINT MOBILITY TESTING:  GH hypomobility  PALPATION:  TTP to R anterior deltoid, R infraspinatus    TREATMENT: [facility] Adult PT Treatment:                                                DATE: 09/24/23 Therapeutic Exercise: Supine dowel chest press x 10 Supine dowel chest press and above pullovers  x 10 Side lying Shoulder abduction pal down x 10 Side lying shoulder flexion x 10 Side lying shoulder horiz abduction  x 10  Side lying shoulder ER AROM 10 x 2  Standing row 2x10 YTB Standing ext 2x10 YTB Standing ER 2x10 YTB Standing IR 2x10 YTB Standing UE ranger flexion and scaption reaching x 10 each  Seated pulleys flexion and scaption 1 minute each Standing scaption AROM- pain/ discontinued Standing short level shoulder flexion x 5      [facility] Adult PT Treatment:  DATE: 09/21/23 Therapeutic Exercise Supine shoulder flexion x 10 R caused pain limited range at end  Supine dow flexion 3x10 AAROM Supine dow chest press 3x10 Seated scapular retractions 2x 10 Seated RTB pull aparts 2x10 Seated single arm RTB row Pulleys into flexion x3 min Standing row 2x10 YTB Standing ext 2x10 YTB Standing ER 2x10 YTB Standing IR 2x10 YTB  [facility] Adult PT Treatment:                                                DATE: 09/14/23 Supine shoulder flexion x 10 R - increased pain Supine dow flexion 2x10 AAROM Supine dow chest press 2x10 Seated scapular retractions x 10  Pulleys into flexion x 2 min Standing row 2x10 GTB Standing ext 2x10 RTB Seated horizontal abd 2x10 YTB Seated cane ER x 10 AAROM R  [facility] Adult PT Treatment:                                                DATE: 09/13/23 Supine shoulder flexion 2x10 R Supine dow flexion 2x10 AAROM S/L shoulder abd 2x10 R Seated scapular retractions x 10  Standing row 2x10 GTB  Pulleys into flexion x 2 min R shoulder IR/ER isometric x 10 - 5 hold Seated cane ER 2x10 [name]  [facility] Adult PT Treatment:                                                DATE: 09/06/23 Therapeutic Exercise: Seated table slides flexion and abduction 10 x 2 each  Supine AAROM shoulder flexion 10 x 2  Supine ER AAROM with dowel  Scap retractions x 15 Passive shoulder flexion, abduction, ER , IR Isometric shoulder ER 3 sec x 10 Isometric shoulder IR 3 sec x 10 Self Care: Discussion of precautions: avoid pain, no lifting (avoid resisted elbow flexion)   [facility] Adult PT Treatment:                                                DATE: 08/29/2023 Therapeutic Exercise: Seated scapular retraction x 5  Seated flexion table slide x 5 R Seated abd table slide x 5 R Supine shoulder flex with L UE support x 5 AAROM R   PATIENT EDUCATION: Education details: eval findings,  Quick DASH, HEP, POC Person educated: Patient Education method: Explanation, Demonstration, and Handouts Education comprehension: verbalized understanding and returned demonstration  HOME EXERCISE PROGRAM: Access Code: [email or web address] URL: [email or web address] Date: 09/13/2023 Prepared by: Alm Kingdom  Exercises - Seated Scapular Retraction  - 1-2 x daily - 7 x weekly - 2 sets - 10 reps - 3-5 sec hold - Seated Shoulder Flexion Towel Slide at Table Top  - 1-2 x daily - 7 x weekly - 3 sets - 10 reps - 5 sec hold - Seated Shoulder Abduction Towel Slide at Table Top  - 1-2 x daily - 7 x weekly - 3 sets - 10 reps - 5 sec  hold - Supine Shoulder Flexion AAROM with Hands Clasped  - 1-2 x daily - 7 x weekly - 3 sets - 10 reps - 5 sec hold - Standing Isometric Shoulder Internal Rotation at Doorway  - 1 x daily - 7 x weekly - 1 sets - 10 reps - 3-5 hold - Standing Isometric Shoulder External Rotation with Doorway  - 1 x daily - 7 x weekly - 3 sets - 10 reps - 3-5 hold - Supine Shoulder Flexion Extension Full Range AROM  - 1 x daily - 7 x weekly - 2 sets - 10 reps - Sidelying Shoulder Abduction Palm Forward  - 1 x daily - 7 x weekly - 2 sets - 10 reps  ASSESSMENT:  CLINICAL IMPRESSION: Pt reports her shoulder may be aggravated from prolonged use of her phone while sitting. Also, may be supine lying without support under arm. She will adjust and see if there is improvement. Worked in side lying for shoulder abduction and flexion which was well tolerated without increased pain. Continues to have pain with AROM against gravity, although short level flexion well tolerated for low reps.  Plan to target flexion exercises and progress band level.  EVAL: Patient is a 48 y.o. F who was seen today for physical therapy evaluation and treatment s/p R shoulder arthroscopic debridement, SAD, and biceps tenotomy performed by Dr. Jerri on 08/15/2023. Physical findings are consistent with surgery and recovery  time post op as she demonstrates decrease R shoulder ROM and strength. Quick DASH shows severe disability in performance of home ADLs and community activities. Pt would benefit form skilled PT services post op working on improving proximal strength and shoulder function.   OBJECTIVE IMPAIRMENTS: decreased activity tolerance, decreased endurance, decreased mobility, decreased ROM, decreased strength, postural dysfunction, and pain   ACTIVITY LIMITATIONS: carrying, lifting, reach over head, and hygiene/grooming  PARTICIPATION LIMITATIONS: meal prep, cleaning, driving, shopping, community activity, and yard work  PERSONAL FACTORS: Time since onset of injury/illness/exacerbation and 3+ comorbidities: DM II, bipolar, depression are also affecting patient's functional outcome.   REHAB POTENTIAL: Good  CLINICAL DECISION MAKING: Evolving/moderate complexity  EVALUATION COMPLEXITY: Moderate   GOALS: Goals reviewed with patient? No  SHORT TERM GOALS: Target date: 09/19/2023   Pt will be compliant and knowledgeable with initial HEP for improved comfort and carryover Baseline: initial HEP given  Goal status: MET  2.  Pt will self report right shoulder pain no greater than 6/10 for improved comfort and functional ability Baseline: 10/10 at worst Goal status: IN PROGRESS   LONG TERM GOALS: Target date: 11/21/2023   Pt will decrease Quick DASH disability score to no greater than 50% as proxy for functional improvement Baseline: 80% disability  Goal status: INITIAL  2.  Pt will self report right shoulder pain no greater than 3/10 for improved comfort and functional ability Baseline: 10/10 at worst Goal status: INITIAL    3.  Pt will improve R shoulder flex to at least 140 degrees for improved functional with home and community activities  Baseline: see ROM chart Goal status: INITIAL  4.  Pt will improve R shoulder MMT to at least 4/5 for improved functional ability and shoulder stability  with OH motion Baseline: DNT Goal status: INITIAL   PLAN:  PT FREQUENCY: 2x/week  PT DURATION: 8 weeks  PLANNED INTERVENTIONS: 97164- PT Re-evaluation, 97110-Therapeutic exercises, 97530- Therapeutic activity, [id number]- Neuromuscular re-education, 97535- Self Care, 02859- Manual therapy, H9716- Electrical stimulation (unattended), [id number]- Electrical stimulation (manual), 97016- Vasopneumatic device,  79439 (1-2 muscles), 20561 (3+ muscles)- Dry Needling, Cryotherapy, and Moist heat  PLAN FOR NEXT SESSION: assess HEP response, progress flexion and strength, improve comfort with OH motion   Harlene Persons, PTA 09/24/23 8:25 AM Phone: [phone] Fax: [phone]

## 2023-09-28 ENCOUNTER — Ambulatory Visit: Admitting: Physical Therapy

## 2023-09-28 ENCOUNTER — Encounter: Admitting: Orthopaedic Surgery

## 2023-09-28 ENCOUNTER — Other Ambulatory Visit (HOSPITAL_COMMUNITY): Payer: Self-pay

## 2023-09-28 ENCOUNTER — Encounter: Payer: Self-pay | Admitting: Physical Therapy

## 2023-09-28 DIAGNOSIS — M6281 Muscle weakness (generalized): Secondary | ICD-10-CM

## 2023-09-28 DIAGNOSIS — M25511 Pain in right shoulder: Secondary | ICD-10-CM | POA: Diagnosis not present

## 2023-09-28 NOTE — Therapy (Signed)
 OUTPATIENT PHYSICAL THERAPY SHOULDER TREATMENT   Patient Name: [name] [name] MRN: [id number] DOB:1975-11-11, 48 y.o., female Today's Date: 09/28/2023  END OF SESSION:  PT End of Session - 09/28/23 0720     Visit Number 7    Number of Visits 17    Date for PT Re-Evaluation 11/21/23    Authorization Type [place] [employer]    PT Start Time 0718    PT Stop Time 0806    PT Time Calculation (min) 48 min             Past Medical History:  Diagnosis Date   Allergy    Anemia    Arthritis    Bipolar 1 disorder (HCC)    Sees Dr. Kermit, psychology,  [phone]   Carpal tunnel syndrome    Depression    Diabetes mellitus without complication (HCC)    Family history of adverse reaction to anesthesia    mother & son have difficulty waking   Fatigue    Ganglion of left wrist 07/26/2018   Lung nodule seen on imaging study 09/24/2013   Peripheral vascular disease (HCC)    Sleep apnea    uses cpap   Past Surgical History:  Procedure Laterality Date   ABDOMINAL HYSTERECTOMY     CHOLECYSTECTOMY N/A 06/22/2017   Procedure: LAPAROSCOPIC CHOLECYSTECTOMY;  Surgeon: Stevie Herlene Righter, MD;  Location: [facility];  Service: General;  Laterality: N/A;   I & D EXTREMITY Right 09/14/2015   Procedure: IRRIGATION AND DEBRIDEMENT RIGHT MIDDLE FINGER;  Surgeon: Elsie Mussel, MD;  Location: [facility] OR;  Service: Orthopedics;  Laterality: Right;   POSTERIOR LUMBAR FUSION 2 WITH HARDWARE REMOVAL Right 08/15/2023   Procedure: ARTHROSCOPY, SHOULDER WITH DEBRIDEMENT;  Surgeon: Jerri Kay HERO, MD;  Location: [facility];  Service: Orthopedics;  Laterality: Right;   TUBAL LIGATION     Patient Active Problem List   Diagnosis Date Noted   Routine adult health maintenance 09/21/2023   Partial nontraumatic tear of right rotator cuff 08/15/2023   Impingement syndrome of right shoulder 08/15/2023   Excessive daytime sleepiness 12/27/2022   Sleep attack 12/27/2022   Nightmare disorder 12/27/2022    Snoring 12/27/2022   Chronic right shoulder pain 11/16/2022   ADHD 01/03/2022   Urine frequency 08/08/2018   OSA on CPAP 02/09/2016   Diabetes mellitus (HCC) 07/11/2012   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 W/O OBSTRUCTION 05/03/2006    PCP: Marlee Lynwood NOVAK, MD  REFERRING PROVIDER: Jule Ronal CROME, PA-C  REFERRING DIAG: [phone] (ICD-10-CM) - History of arthroscopy of right shoulder   THERAPY DIAG:  Acute pain of right shoulder  Muscle weakness (generalized)  Rationale for Evaluation and Treatment: Rehabilitation  ONSET DATE: 08/15/2023  SUBJECTIVE:  SUBJECTIVE STATEMENT: Pt reports 8-9/10. Was fine all week until I after I did side lying exercises and I also had to pull a child which may have aggravated.     Hand dominance: Right  PERTINENT HISTORY: DM II, bipolar, depression  PAIN:  Are you having pain?  Yes: NPRS scale: 8-9/10 Worst: 10/10 Pain location: R anterior shoulder Pain description: sharp, sore Aggravating factors: OH reaching, lifting Relieving factors: rest  PRECAUTIONS: avoid resisted elbow flexion  RED FLAGS: None   WEIGHT BEARING RESTRICTIONS: No  FALLS:  Has patient fallen in last 6 months? No  LIVING ENVIRONMENT: Lives with: lives with their family Lives in: House/apartment Stairs: No Has following equipment at home: None  OCCUPATION: Not working  PLOF: Independent  PATIENT GOALS: get back   NEXT MD VISIT: 09/28/2023- rescheduled 10/05/23  OBJECTIVE:  Note: Objective measures were completed at  Evaluation unless otherwise noted.  DIAGNOSTIC FINDINGS:  See imaging   PATIENT SURVEYS:  Quick DASH: 80% disability  COGNITION: Overall cognitive status: Within functional limits for tasks assessed     SENSATION: Light touch: Impaired - anterior R UE  POSTURE: Rounded shoulder, large body habitus  UPPER EXTREMITY ROM:   Active ROM Right eval Left eval Right 09/28/23  Shoulder flexion 60 WFL 135  Shoulder extension     Shoulder abduction 83 WFL 120  Shoulder adduction     Shoulder internal rotation  WFL Reach to right low back   Shoulder external rotation 25 WFL Reach to T2   Elbow flexion     Elbow extension     Wrist flexion     Wrist extension     Wrist ulnar deviation     Wrist radial deviation     Wrist pronation     Wrist supination     (Blank rows = not tested)  UPPER EXTREMITY MMT:  MMT Right eval Left eval  Shoulder flexion DNT 4  Shoulder extension    Shoulder abduction DNT 4  Shoulder adduction    Shoulder internal rotation DNT 4  Shoulder external rotation DNT   Middle trapezius    Lower trapezius    Elbow flexion    Elbow extension    Wrist flexion    Wrist extension    Wrist ulnar deviation    Wrist radial deviation    Wrist pronation    Wrist supination    Grip strength (lbs)    (Blank rows = not tested)  SHOULDER SPECIAL TESTS: DNT  JOINT MOBILITY TESTING:  GH hypomobility  PALPATION:  TTP to R anterior deltoid, R infraspinatus    TREATMENT: [facility] Adult PT Treatment:                                                DATE: 09/28/23  Pulleys for flexion and scaption Standing shoulder flexion wall slides  Standing cane shoulder flexion  Standing cane shoulder abduction  Side lying Shoulder abduction pal down x 10 Side lying shoulder flexion x 10 Side lying shoulder horiz abduction  x 10  Side lying shoulder ER AROM x 5  Supine shoulder AAROM flexion with cane - lap to OH x 5  Standing Row YTB x 10 Standing shoulder ext YTB x  10 Standing shoulder ER x 10 Standing shoulder IR x 10    [facility] Adult PT Treatment:  DATE: 09/24/23 Therapeutic Exercise: Supine dowel chest press x 10 Supine dowel chest press and above pullovers  x 10 Side lying Shoulder abduction pal down x 10 Side lying shoulder flexion x 10 Side lying shoulder horiz abduction  x 10  Side lying shoulder ER AROM 10 x 2  Standing row 2x10 YTB Standing ext 2x10 YTB Standing ER 2x10 YTB Standing IR 2x10 YTB Standing UE ranger flexion and scaption reaching x 10 each  Seated pulleys flexion and scaption 1 minute each Standing scaption AROM- pain/ discontinued Standing short level shoulder flexion x 5     [facility] Adult PT Treatment:                                                DATE: 09/21/23 Therapeutic Exercise Supine shoulder flexion x 10 R caused pain limited range at end  Supine dow flexion 3x10 AAROM Supine dow chest press 3x10 Seated scapular retractions 2x 10 Seated RTB pull aparts 2x10 Seated single arm RTB row Pulleys into flexion x3 min Standing row 2x10 YTB Standing ext 2x10 YTB Standing ER 2x10 YTB Standing IR 2x10 YTB  [facility] Adult PT Treatment:                                                DATE: 09/14/23 Supine shoulder flexion x 10 R - increased pain Supine dow flexion 2x10 AAROM Supine dow chest press 2x10 Seated scapular retractions x 10  Pulleys into flexion x 2 min Standing row 2x10 GTB Standing ext 2x10 RTB Seated horizontal abd 2x10 YTB Seated cane ER x 10 AAROM R   PATIENT EDUCATION: Education details: eval findings, Quick DASH, HEP, POC Person educated: Patient Education method: Explanation, Demonstration, and Handouts Education comprehension: verbalized understanding and returned demonstration  HOME EXERCISE PROGRAM: Access Code: [email or web address] URL: [email or web address] Date: 09/13/2023 Prepared by: Alm Kingdom  Exercises - Seated Scapular Retraction   - 1-2 x daily - 7 x weekly - 2 sets - 10 reps - 3-5 sec hold - Seated Shoulder Flexion Towel Slide at Table Top  - 1-2 x daily - 7 x weekly - 3 sets - 10 reps - 5 sec hold - Seated Shoulder Abduction Towel Slide at Table Top  - 1-2 x daily - 7 x weekly - 3 sets - 10 reps - 5 sec hold - Supine Shoulder Flexion AAROM with Hands Clasped  - 1-2 x daily - 7 x weekly - 3 sets - 10 reps - 5 sec hold - Standing Isometric Shoulder Internal Rotation at Doorway  - 1 x daily - 7 x weekly - 1 sets - 10 reps - 3-5 hold - Standing Isometric Shoulder External Rotation with Doorway  - 1 x daily - 7 x weekly - 3 sets - 10 reps - 3-5 hold - Supine Shoulder Flexion Extension Full Range AROM  - 1 x daily - 7 x weekly - 2 sets - 10 reps - Sidelying Shoulder Abduction Palm Forward  - 1 x daily - 7 x weekly - 2 sets - 10 reps  ASSESSMENT:  CLINICAL IMPRESSION: Pt pain is flared on arrival 8-9/10, likely due to using arm to pull a child yesterday. Otherwise, she reports that her shoulder has been  fine this week. Her AROM in standing is improved despite pain exacerbation. Continued with AAROM and gravity minimized AROM as tolerated. Continued light RTC strengthening with therabands. Pt completed all prescribed exercises, with decreased reps today due to pain.  SABRA  EVAL: Patient is a 48 y.o. F who was seen today for physical therapy evaluation and treatment s/p R shoulder arthroscopic debridement, SAD, and biceps tenotomy performed by Dr. Jerri on 08/15/2023. Physical findings are consistent with surgery and recovery time post op as she demonstrates decrease R shoulder ROM and strength. Quick DASH shows severe disability in performance of home ADLs and community activities. Pt would benefit form skilled PT services post op working on improving proximal strength and shoulder function.   OBJECTIVE IMPAIRMENTS: decreased activity tolerance, decreased endurance, decreased mobility, decreased ROM, decreased strength, postural  dysfunction, and pain   ACTIVITY LIMITATIONS: carrying, lifting, reach over head, and hygiene/grooming  PARTICIPATION LIMITATIONS: meal prep, cleaning, driving, shopping, community activity, and yard work  PERSONAL FACTORS: Time since onset of injury/illness/exacerbation and 3+ comorbidities: DM II, bipolar, depression are also affecting patient's functional outcome.   REHAB POTENTIAL: Good  CLINICAL DECISION MAKING: Evolving/moderate complexity  EVALUATION COMPLEXITY: Moderate   GOALS: Goals reviewed with patient? No  SHORT TERM GOALS: Target date: 09/19/2023   Pt will be compliant and knowledgeable with initial HEP for improved comfort and carryover Baseline: initial HEP given  Goal status: MET  2.  Pt will self report right shoulder pain no greater than 6/10 for improved comfort and functional ability Baseline: 10/10 at worst 7/25/2: 8-9/10 this morning  Goal status: IN PROGRESS   LONG TERM GOALS: Target date: 11/21/2023   Pt will decrease Quick DASH disability score to no greater than 50% as proxy for functional improvement Baseline: 80% disability  Goal status: INITIAL  2.  Pt will self report right shoulder pain no greater than 3/10 for improved comfort and functional ability Baseline: 10/10 at worst Goal status: INITIAL    3.  Pt will improve R shoulder flex to at least 140 degrees for improved functional with home and community activities  Baseline: see ROM chart Goal status: INITIAL  4.  Pt will improve R shoulder MMT to at least 4/5 for improved functional ability and shoulder stability with OH motion Baseline: DNT Goal status: INITIAL   PLAN:  PT FREQUENCY: 2x/week  PT DURATION: 8 weeks  PLANNED INTERVENTIONS: 97164- PT Re-evaluation, 97110-Therapeutic exercises, 97530- Therapeutic activity, 97112- Neuromuscular re-education, 97535- Self Care, 02859- Manual therapy, G0283- Electrical stimulation (unattended), [id number]- Electrical stimulation (manual),  97016- Vasopneumatic device, 20560 (1-2 muscles), 20561 (3+ muscles)- Dry Needling, Cryotherapy, and Moist heat  PLAN FOR NEXT SESSION: assess HEP response, progress flexion and strength, improve comfort with OH motion   Harlene Persons, PTA 09/28/23 7:57 AM Phone: [phone] Fax: [phone]

## 2023-10-01 ENCOUNTER — Other Ambulatory Visit (HOSPITAL_COMMUNITY): Payer: Self-pay

## 2023-10-03 ENCOUNTER — Encounter: Payer: Self-pay | Admitting: Physical Therapy

## 2023-10-03 ENCOUNTER — Ambulatory Visit: Admitting: Physical Therapy

## 2023-10-03 DIAGNOSIS — M25511 Pain in right shoulder: Secondary | ICD-10-CM | POA: Diagnosis not present

## 2023-10-03 DIAGNOSIS — M6281 Muscle weakness (generalized): Secondary | ICD-10-CM

## 2023-10-03 NOTE — Therapy (Signed)
 OUTPATIENT PHYSICAL THERAPY SHOULDER TREATMENT   Patient Name: [name] [name] MRN: [id number] DOB:1976/01/08, 48 y.o., female Today's Date: 10/03/2023  END OF SESSION:  PT End of Session - 10/03/23 0728     Visit Number 8    Number of Visits 17    Date for PT Re-Evaluation 11/21/23    Authorization Type [place] [employer]    PT Start Time 0721    PT Stop Time 0809    PT Time Calculation (min) 48 min             Past Medical History:  Diagnosis Date   Allergy    Anemia    Arthritis    Bipolar 1 disorder (HCC)    Sees Dr. Kermit, psychology,  [phone]   Carpal tunnel syndrome    Depression    Diabetes mellitus without complication (HCC)    Family history of adverse reaction to anesthesia    mother & son have difficulty waking   Fatigue    Ganglion of left wrist 07/26/2018   Lung nodule seen on imaging study 09/24/2013   Peripheral vascular disease (HCC)    Sleep apnea    uses cpap   Past Surgical History:  Procedure Laterality Date   ABDOMINAL HYSTERECTOMY     CHOLECYSTECTOMY N/A 06/22/2017   Procedure: LAPAROSCOPIC CHOLECYSTECTOMY;  Surgeon: Stevie Herlene Righter, MD;  Location: [facility];  Service: General;  Laterality: N/A;   I & D EXTREMITY Right 09/14/2015   Procedure: IRRIGATION AND DEBRIDEMENT RIGHT MIDDLE FINGER;  Surgeon: Elsie Mussel, MD;  Location: [facility];  Service: Orthopedics;  Laterality: Right;   POSTERIOR LUMBAR FUSION 2 WITH HARDWARE REMOVAL Right 08/15/2023   Procedure: ARTHROSCOPY, SHOULDER WITH DEBRIDEMENT;  Surgeon: Jerri Kay HERO, MD;  Location: [facility];  Service: Orthopedics;  Laterality: Right;   TUBAL LIGATION     Patient Active Problem List   Diagnosis Date Noted   Routine adult health maintenance 09/21/2023   Partial nontraumatic tear of right rotator cuff 08/15/2023   Impingement syndrome of right shoulder 08/15/2023   Excessive daytime sleepiness 12/27/2022   Sleep attack 12/27/2022   Nightmare disorder 12/27/2022    Snoring 12/27/2022   Chronic right shoulder pain 11/16/2022   ADHD 01/03/2022   Urine frequency 08/08/2018   OSA on CPAP 02/09/2016   Diabetes mellitus (HCC) 07/11/2012   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 W/O OBSTRUCTION 05/03/2006    PCP: Marlee Lynwood NOVAK, MD  REFERRING PROVIDER: Jule Ronal CROME, PA-C  REFERRING DIAG: [phone] (ICD-10-CM) - History of arthroscopy of right shoulder   THERAPY DIAG:  Acute pain of right shoulder  Muscle weakness (generalized)  Rationale for Evaluation and Treatment: Rehabilitation  ONSET DATE: 08/15/2023  SUBJECTIVE:  SUBJECTIVE STATEMENT: My shoulder has been okay. I tried not to do anything to aggravate it before PT.     Hand dominance: Right  PERTINENT HISTORY: DM II, bipolar, depression  PAIN:  Are you having pain?  Yes: NPRS scale: 0/10 Worst: 10/10 Pain location: R anterior shoulder Pain description: sharp, sore Aggravating factors: OH reaching, lifting Relieving factors: rest  PRECAUTIONS: avoid resisted elbow flexion  RED FLAGS: None   WEIGHT BEARING RESTRICTIONS: No  FALLS:  Has patient fallen in last 6 months? No  LIVING ENVIRONMENT: Lives with: lives with their family Lives in: House/apartment Stairs: No Has following equipment at home: None  OCCUPATION: Not working  PLOF: Independent  PATIENT GOALS: get back   NEXT MD VISIT: 09/28/2023- rescheduled 10/05/23  OBJECTIVE:  Note: Objective measures were completed at Evaluation unless otherwise noted.  DIAGNOSTIC FINDINGS:   See imaging   PATIENT SURVEYS:  Quick DASH: 80% disability  COGNITION: Overall cognitive status: Within functional limits for tasks assessed     SENSATION: Light touch: Impaired - anterior R UE  POSTURE: Rounded shoulder, large body habitus  UPPER EXTREMITY ROM:   Active ROM Right eval Left eval Right 09/28/23  Shoulder flexion 60 WFL 135  Shoulder extension     Shoulder abduction 83 WFL 120  Shoulder adduction     Shoulder internal rotation  WFL Reach to right low back   Shoulder external rotation 25 WFL Reach to T2   Elbow flexion     Elbow extension     Wrist flexion     Wrist extension     Wrist ulnar deviation     Wrist radial deviation     Wrist pronation     Wrist supination     (Blank rows = not tested)  UPPER EXTREMITY MMT:  MMT Right eval Left eval  Shoulder flexion DNT 4  Shoulder extension    Shoulder abduction DNT 4  Shoulder adduction    Shoulder internal rotation DNT 4  Shoulder external rotation DNT   Middle trapezius    Lower trapezius    Elbow flexion    Elbow extension    Wrist flexion    Wrist extension    Wrist ulnar deviation    Wrist radial deviation    Wrist pronation    Wrist supination    Grip strength (lbs)    (Blank rows = not tested)  SHOULDER SPECIAL TESTS: DNT  JOINT MOBILITY TESTING:  GH hypomobility  PALPATION:  TTP to R anterior deltoid, R infraspinatus    TREATMENT: [facility] Adult PT Treatment:                                                DATE: 10/03/23 Seated OH press holding dowel 3 x 10  YTB ER bilat 2 x 10 Seated scap squeeze Seated Scaption AROM 3 x 10 Seated red band IR 15 x 2  Standing shoulder ext red band 15 x 2  Standing shoulder flexion x 10- cues for true flexion- tends to scaption Supine full dowel pullovers x 12  Sidelying shoulder abduction , horizontal abduction , flexion round of 10  Ice pack right shoulder x 10 min Nustep 15 minutes LE only level 5 concurrent with first 5 exercises.      [facility] Adult PT Treatment:  DATE: 09/28/23  Pulleys for flexion and scaption Standing shoulder flexion wall slides  Standing cane shoulder flexion  Standing cane shoulder abduction  Side lying Shoulder abduction pal down x 10 Side lying shoulder flexion x 10 Side lying shoulder horiz abduction  x 10  Side lying shoulder ER AROM x 5  Supine shoulder AAROM flexion with cane - lap to OH x 5  Standing Row YTB x 10 Standing shoulder ext YTB x 10 Standing shoulder ER x 10 Standing shoulder IR x 10    [facility] Adult PT Treatment:                                                DATE: 09/24/23 Therapeutic Exercise: Supine dowel chest press x 10 Supine dowel chest press and above pullovers  x 10 Side lying Shoulder abduction pal down x 10 Side lying shoulder flexion x 10 Side lying shoulder horiz abduction  x 10  Side lying shoulder ER AROM 10 x 2  Standing row 2x10 YTB Standing ext 2x10 YTB Standing ER 2x10 YTB Standing IR 2x10 YTB Standing UE ranger flexion and scaption reaching x 10 each  Seated pulleys flexion and scaption 1 minute each Standing scaption AROM- pain/ discontinued Standing short level shoulder flexion x 5     [facility] Adult PT Treatment:                                                DATE: 09/21/23 Therapeutic Exercise Supine shoulder flexion x 10 R caused pain limited range at end  Supine dow flexion 3x10 AAROM Supine dow chest press 3x10 Seated scapular retractions 2x 10 Seated RTB pull aparts 2x10 Seated single arm RTB row Pulleys into flexion x3 min Standing row 2x10 YTB Standing ext 2x10 YTB Standing ER 2x10 YTB Standing IR 2x10 YTB  [facility] Adult PT Treatment:                                                DATE: 09/14/23 Supine shoulder flexion x 10 R - increased pain Supine dow flexion 2x10 AAROM Supine dow chest press 2x10 Seated scapular retractions x 10  Pulleys into flexion x 2 min Standing row 2x10  GTB Standing ext 2x10 RTB Seated horizontal abd 2x10 YTB Seated cane ER x 10 AAROM R   PATIENT EDUCATION: Education details: eval findings, Quick DASH, HEP, POC Person educated: Patient Education method: Explanation, Demonstration, and Handouts Education comprehension: verbalized understanding and returned demonstration  HOME EXERCISE PROGRAM: Access Code: [email or web address] URL: [email or web address] Date: 09/13/2023 Prepared by: Alm Kingdom  Exercises - Seated Scapular Retraction  - 1-2 x daily - 7 x weekly - 2 sets - 10 reps - 3-5 sec hold - Seated Shoulder Flexion Towel Slide at Table Top  - 1-2 x daily - 7 x weekly - 3 sets - 10 reps - 5 sec hold - Seated Shoulder Abduction Towel Slide at Table Top  - 1-2 x daily - 7 x weekly - 3 sets - 10 reps - 5 sec hold - Supine Shoulder Flexion AAROM with Hands Clasped  -  1-2 x daily - 7 x weekly - 3 sets - 10 reps - 5 sec hold - Standing Isometric Shoulder Internal Rotation at Doorway  - 1 x daily - 7 x weekly - 1 sets - 10 reps - 3-5 hold - Standing Isometric Shoulder External Rotation with Doorway  - 1 x daily - 7 x weekly - 3 sets - 10 reps - 3-5 hold - Supine Shoulder Flexion Extension Full Range AROM  - 1 x daily - 7 x weekly - 2 sets - 10 reps - Sidelying Shoulder Abduction Palm Forward  - 1 x daily - 7 x weekly - 2 sets - 10 reps  ASSESSMENT:  CLINICAL IMPRESSION: Pt reports her shoulder has been okay this week. She did sleep on it wrong last night but treated it with ice which reduced the pain. Today she is requesting to perform an aerobic machine while completing her prescribed shoulder exercises due to wanting to work on her LE pains. She completed 15 minutes of LE work on the [employer] while performing AROM and light RTC strengthening with her RUE.  She will see MD on Friday prior to her PT appt. Her tolerance to AROM against gravity was improved today.    SABRA  EVAL: Patient is a 48 y.o. F who was seen today for physical  therapy evaluation and treatment s/p R shoulder arthroscopic debridement, SAD, and biceps tenotomy performed by Dr. Jerri on 08/15/2023. Physical findings are consistent with surgery and recovery time post op as she demonstrates decrease R shoulder ROM and strength. Quick DASH shows severe disability in performance of home ADLs and community activities. Pt would benefit form skilled PT services post op working on improving proximal strength and shoulder function.   OBJECTIVE IMPAIRMENTS: decreased activity tolerance, decreased endurance, decreased mobility, decreased ROM, decreased strength, postural dysfunction, and pain   ACTIVITY LIMITATIONS: carrying, lifting, reach over head, and hygiene/grooming  PARTICIPATION LIMITATIONS: meal prep, cleaning, driving, shopping, community activity, and yard work  PERSONAL FACTORS: Time since onset of injury/illness/exacerbation and 3+ comorbidities: DM II, bipolar, depression are also affecting patient's functional outcome.   REHAB POTENTIAL: Good  CLINICAL DECISION MAKING: Evolving/moderate complexity  EVALUATION COMPLEXITY: Moderate   GOALS: Goals reviewed with patient? No  SHORT TERM GOALS: Target date: 09/19/2023   Pt will be compliant and knowledgeable with initial HEP for improved comfort and carryover Baseline: initial HEP given  Goal status: MET  2.  Pt will self report right shoulder pain no greater than 6/10 for improved comfort and functional ability Baseline: 10/10 at worst 7/25/2: 8-9/10 this morning  Goal status: IN PROGRESS   LONG TERM GOALS: Target date: 11/21/2023   Pt will decrease Quick DASH disability score to no greater than 50% as proxy for functional improvement Baseline: 80% disability  Goal status: INITIAL  2.  Pt will self report right shoulder pain no greater than 3/10 for improved comfort and functional ability Baseline: 10/10 at worst Goal status: INITIAL    3.  Pt will improve R shoulder flex to at least 140  degrees for improved functional with home and community activities  Baseline: see ROM chart Goal status: INITIAL  4.  Pt will improve R shoulder MMT to at least 4/5 for improved functional ability and shoulder stability with OH motion Baseline: DNT Goal status: INITIAL   PLAN:  PT FREQUENCY: 2x/week  PT DURATION: 8 weeks  PLANNED INTERVENTIONS: 97164- PT Re-evaluation, 97110-Therapeutic exercises, 97530- Therapeutic activity, [id number]- Neuromuscular re-education, 97535- Self Care, 02859-  Manual therapy, G0283- Electrical stimulation (unattended), [id number]- Electrical stimulation (manual), 02983- Vasopneumatic device, [phone] (1-2 muscles), 20561 (3+ muscles)- Dry Needling, Cryotherapy, and Moist heat  PLAN FOR NEXT SESSION: assess HEP response, progress flexion and strength, improve comfort with OH motion   Harlene Persons, PTA 10/03/23 8:09 AM Phone: [phone] Fax: [phone]

## 2023-10-05 ENCOUNTER — Encounter: Payer: Self-pay | Admitting: Physician Assistant

## 2023-10-05 ENCOUNTER — Encounter: Admitting: Physical Therapy

## 2023-10-05 ENCOUNTER — Other Ambulatory Visit: Payer: Self-pay

## 2023-10-05 ENCOUNTER — Ambulatory Visit: Admitting: Physician Assistant

## 2023-10-05 ENCOUNTER — Other Ambulatory Visit (HOSPITAL_COMMUNITY): Payer: Self-pay

## 2023-10-05 DIAGNOSIS — Z9889 Other specified postprocedural states: Secondary | ICD-10-CM

## 2023-10-05 MED ORDER — LAMOTRIGINE 100 MG PO TABS
200.0000 mg | ORAL_TABLET | Freq: Every day | ORAL | 2 refills | Status: DC
Start: 2023-10-05 — End: 2024-01-07
  Filled 2023-10-05: qty 60, 30d supply, fill #0
  Filled 2023-10-23 – 2023-11-13 (×2): qty 60, 30d supply, fill #1
  Filled 2023-12-07: qty 60, 30d supply, fill #2

## 2023-10-05 NOTE — Progress Notes (Signed)
 Post-Op Visit Note   Patient: [name] [name]           Date of Birth: Feb 16, 1976           MRN: [id number] Visit Date: 10/05/2023 PCP: Jerrie Gathers, DO   Assessment & Plan:  Chief Complaint:  Chief Complaint  Patient presents with   Right Shoulder - Follow-up    Right shoulder scope 08/15/2023   Visit Diagnoses:  1. History of arthroscopy of right shoulder     Plan: Patient is a pleasant 48 year old female who comes in today approximately 7 weeks status post right shoulder arthroscopic debridement rotator cuff, subacromial decompression and biceps tenotomy 08/15/2023.  She continues to have pain primarily to the anterior shoulder.  This appears to be worse in physical therapy as well as with any cross body reaching.  She has been taking ibuprofen  as needed.  She is in physical therapy making good progress.  Examination of the right shoulder reveals forward flexion to about 130 degrees.  Abduction about 120 degrees.  Internal rotation to her back pocket.  She is neurovascularly intact distally.  At this point, I have explained the surgery in detail.  I have discussed with her that we could add a cortisone injection or provide her with a stronger medicine other than ibuprofen  for pain to help with PT.  She is not interested in either.  She will preload with ibuprofen .  She will follow-up with us  in about 5 to 6 weeks for recheck.  Call with concerns or questions.  Follow-Up Instructions: Return in about 5 weeks (around 11/09/2023).   Orders:  No orders of the defined types were placed in this encounter.  No orders of the defined types were placed in this encounter.   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 W/O OBSTRUCTION 05/03/2006   Past Medical History:  Diagnosis Date   Allergy    Anemia    Arthritis    Bipolar 1 disorder [facility])    Sees Dr. Kermit, psychology,  [phone]   Carpal tunnel syndrome    Depression    Diabetes mellitus without complication [facility])    Family history of adverse reaction to anesthesia    mother & son have difficulty waking   Fatigue    Ganglion of left wrist 07/26/2018   Lung nodule seen on imaging study 09/24/2013   Peripheral vascular disease (HCC)    Sleep apnea    uses cpap    Family History  Problem Relation Age of Onset   Hypertension Mother    Diabetes Mellitus II Mother    Congestive Heart Failure Father    Hypertension Father    Diabetes Mellitus II Father    Sleep apnea  Father    Sleep apnea Son    Colon cancer Neg Hx    Esophageal cancer Neg Hx    Rectal cancer Neg Hx    Stomach cancer Neg Hx    Colon polyps Neg Hx     Past Surgical History:  Procedure Laterality Date   ABDOMINAL HYSTERECTOMY     CHOLECYSTECTOMY N/A 06/22/2017   Procedure: LAPAROSCOPIC CHOLECYSTECTOMY;  Surgeon: Kinsinger, Herlene Righter, MD;  Location: [facility] OR;  Service: General;  Laterality: N/A;   I & D EXTREMITY Right 09/14/2015   Procedure: IRRIGATION AND DEBRIDEMENT RIGHT MIDDLE FINGER;  Surgeon: Elsie Mussel, MD;  Location: [facility] OR;  Service: Orthopedics;  Laterality: Right;   POSTERIOR LUMBAR FUSION 2 WITH HARDWARE REMOVAL Right 08/15/2023   Procedure:  ARTHROSCOPY, SHOULDER WITH DEBRIDEMENT;  Surgeon: Jerri Kay HERO, MD;  Location: [facility];  Service: Orthopedics;  Laterality: Right;   TUBAL LIGATION     Social History   Occupational History   Not on file  Tobacco Use   Smoking status: Never    Passive exposure: Never   Smokeless tobacco: Never  Vaping Use   Vaping status: Never Used  Substance and Sexual Activity   Alcohol use: Not Currently    Comment: occ   Drug use: No   Sexual activity: Not on file    Comment: Hysterectomy

## 2023-10-11 ENCOUNTER — Telehealth: Payer: Self-pay | Admitting: Gastroenterology

## 2023-10-11 NOTE — Telephone Encounter (Signed)
 Returned patients call and let her know that she needs to drink as many fluids as possible between now and tomorrow at her cutoff. She agreed and had no further questions. Also explained that she should just have clear liquids for the rest of the day.

## 2023-10-11 NOTE — Telephone Encounter (Signed)
 Inbound call from patient states she ate a pie about 10 mins ago as well as bread earlier today. Patient scheduled for procedure tomorrow. Please advise.

## 2023-10-12 ENCOUNTER — Encounter: Payer: Self-pay | Admitting: Gastroenterology

## 2023-10-12 ENCOUNTER — Other Ambulatory Visit: Payer: Self-pay | Admitting: Gastroenterology

## 2023-10-12 ENCOUNTER — Ambulatory Visit: Admitting: Gastroenterology

## 2023-10-12 VITALS — BP 131/77 | HR 78 | Temp 97.9°F | Resp 13 | Ht 68.0 in | Wt 291.0 lb

## 2023-10-12 DIAGNOSIS — K648 Other hemorrhoids: Secondary | ICD-10-CM | POA: Diagnosis not present

## 2023-10-12 DIAGNOSIS — K5730 Diverticulosis of large intestine without perforation or abscess without bleeding: Secondary | ICD-10-CM | POA: Diagnosis not present

## 2023-10-12 DIAGNOSIS — K640 First degree hemorrhoids: Secondary | ICD-10-CM

## 2023-10-12 DIAGNOSIS — D122 Benign neoplasm of ascending colon: Secondary | ICD-10-CM

## 2023-10-12 DIAGNOSIS — Z1211 Encounter for screening for malignant neoplasm of colon: Secondary | ICD-10-CM

## 2023-10-12 MED ORDER — SODIUM CHLORIDE 0.9 % IV SOLN: 500.0000 mL | Freq: Once | INTRAVENOUS | Status: DC

## 2023-10-12 MED ORDER — SODIUM CHLORIDE 0.9 % IV SOLN
4.0000 mg | Freq: Once | INTRAVENOUS | Status: AC
Start: 2023-10-12 — End: ?

## 2023-10-12 NOTE — Progress Notes (Signed)
 Pt has nausea in RR.  Zofran  4 mg IV given per D.O.  Zofran  2 mg in 10 mL NS given x2

## 2023-10-12 NOTE — Progress Notes (Signed)
 Called to room to assist during endoscopic procedure.  Patient ID and intended procedure confirmed with present staff. Received instructions for my participation in the procedure from the performing physician.

## 2023-10-12 NOTE — Progress Notes (Signed)
 GASTROENTEROLOGY PROCEDURE H&P NOTE   Primary Care Physician: Bhagat, Virali, DO    Reason for Procedure:  Colon Cancer screening  Plan:    Colonoscopy  Patient is appropriate for endoscopic procedure(s) in the ambulatory ([facility]) setting.  The nature of the procedure, as well as the risks, benefits, and alternatives were carefully and thoroughly reviewed with the patient. Ample time for discussion and questions allowed. The patient understood, was satisfied, and agreed to proceed.     HPI: [name] [name] is a 48 y.o. female who presents for colonoscopy for routine Colon Cancer screening.  No active GI symptoms.  No known family history of colon cancer or related malignancy.  Patient is otherwise without complaints or active issues today.  Past Medical History:  Diagnosis Date   Allergy    Anemia    Arthritis    Bipolar 1 disorder [facility])    Sees Dr. Kermit, psychology,  [phone]   Carpal tunnel syndrome    Depression    Diabetes mellitus without complication [facility])    Family history of adverse reaction to anesthesia    mother & son have difficulty waking   Fatigue    Ganglion of left wrist 07/26/2018   Lung nodule seen on imaging study 09/24/2013   Peripheral vascular disease (HCC)    Sleep apnea    uses cpap    Past Surgical History:  Procedure Laterality Date   ABDOMINAL HYSTERECTOMY     CHOLECYSTECTOMY N/A 06/22/2017   Procedure: LAPAROSCOPIC CHOLECYSTECTOMY;  Surgeon: Stevie Herlene Righter, MD;  Location: [facility] OR;  Service: General;  Laterality: N/A;   I & D EXTREMITY Right 09/14/2015   Procedure: IRRIGATION AND DEBRIDEMENT RIGHT MIDDLE FINGER;  Surgeon: Elsie Mussel, MD;  Location: [facility] OR;  Service: Orthopedics;  Laterality: Right;   POSTERIOR LUMBAR FUSION 2 WITH HARDWARE REMOVAL Right 08/15/2023   Procedure: ARTHROSCOPY, SHOULDER WITH DEBRIDEMENT;  Surgeon: Jerri Kay HERO, MD;  Location: [facility];  Service: Orthopedics;  Laterality: Right;    TUBAL LIGATION      Prior to Admission medications   Medication Sig Start Date End Date Taking? Authorizing Provider  CALCIUM  PO Take by mouth.    [provider]  cetirizine  (ZYRTEC ) 10 MG tablet Take 1 tablet (10 mg total) by mouth daily. 08/27/23   Marlee Lynwood NOVAK, MD  Continuous Glucose Sensor (FREESTYLE LIBRE 3 PLUS SENSOR) MISC Change sensor every 15 days. 03/05/23   McDiarmid, Krystal BIRCH, MD  lamoTRIgine  (LAMICTAL ) 100 MG tablet Take 2 tablets (200 mg total) by mouth daily. 10/05/23   Bhagat, Virali, DO  metFORMIN  (GLUCOPHAGE -XR) 500 MG 24 hr tablet Take 2 tablets (1,000 mg total) by mouth 2 (two) times daily with a meal. 04/18/23   Marlee Lynwood NOVAK, MD  modafinil  (PROVIGIL ) 200 MG tablet Take 1 tablet (200 mg total) by mouth daily. 07/24/23   Dohmeier, Dedra, MD  polyethylene glycol powder (GAVILAX) 17 GM/SCOOP powder Mix 17 grams (1 capful) in beverage and take by mouth daily. 08/27/23   Marlee Lynwood NOVAK, MD  rosuvastatin  (CRESTOR ) 10 MG tablet Take 1 tablet (10 mg total) by mouth daily. 09/21/23   Bhagat, Virali, DO  Semaglutide ,0.25 or 0.5MG /DOS, (OZEMPIC , 0.25 OR 0.5 MG/DOSE,) 2 MG/3ML SOPN Inject 0.25 mg into the skin once a week for 28 days, THEN 0.5 mg once a week for 28 days, THEN 1 mg once a week. 07/18/23 12/20/23  Marlee Lynwood NOVAK, MD  venlafaxine  XR (EFFEXOR  XR) 75 MG 24 hr capsule  Take 1 capsule (75 mg total) by mouth daily with breakfast. To be taken WITH venlafaxine  XR 150mg  for a total dose of 225mg  daily 04/18/23   Marlee Lynwood NOVAK, MD  venlafaxine  XR (EFFEXOR -XR) 150 MG 24 hr capsule Take 1 capsule (150 mg total) by mouth daily. 08/27/23   Marlee Lynwood NOVAK, MD    Current Outpatient Medications  Medication Sig Dispense Refill   CALCIUM  PO Take by mouth.     cetirizine  (ZYRTEC ) 10 MG tablet Take 1 tablet (10 mg total) by mouth daily. 30 tablet 11   Continuous Glucose Sensor (FREESTYLE LIBRE 3 PLUS SENSOR) MISC Change sensor every 15 days. 2 each 11   lamoTRIgine   (LAMICTAL ) 100 MG tablet Take 2 tablets (200 mg total) by mouth daily. 60 tablet 2   metFORMIN  (GLUCOPHAGE -XR) 500 MG 24 hr tablet Take 2 tablets (1,000 mg total) by mouth 2 (two) times daily with a meal. 360 tablet 2   modafinil  (PROVIGIL ) 200 MG tablet Take 1 tablet (200 mg total) by mouth daily. 90 tablet 2   polyethylene glycol powder (GAVILAX) 17 GM/SCOOP powder Mix 17 grams (1 capful) in beverage and take by mouth daily. 510 g 0   rosuvastatin  (CRESTOR ) 10 MG tablet Take 1 tablet (10 mg total) by mouth daily. 90 tablet 3   Semaglutide ,0.25 or 0.5MG /DOS, (OZEMPIC , 0.25 OR 0.5 MG/DOSE,) 2 MG/3ML SOPN Inject 0.25 mg into the skin once a week for 28 days, THEN 0.5 mg once a week for 28 days, THEN 1 mg once a week. 3 mL 3   venlafaxine  XR (EFFEXOR  XR) 75 MG 24 hr capsule Take 1 capsule (75 mg total) by mouth daily with breakfast. To be taken WITH venlafaxine  XR 150mg  for a total dose of 225mg  daily 90 capsule 3   venlafaxine  XR (EFFEXOR -XR) 150 MG 24 hr capsule Take 1 capsule (150 mg total) by mouth daily. 30 capsule 3   Current Facility-Administered Medications  Medication Dose Route Frequency Provider Last Rate Last Admin   0.9 %  sodium chloride  infusion  500 mL Intravenous Once Roslind Michaux V, DO        Allergies as of 10/12/2023 - Review Complete 10/12/2023  Allergen Reaction Noted   Hydromorphone  hcl Anaphylaxis and Hives 11/12/2008   Meperidine  hcl Anaphylaxis and Hives 11/12/2008   Morphine Anaphylaxis and Hives 11/12/2008   Gramineae pollens Other (See Comments) 08/09/2011   Lisinopril Cough 07/28/2005   Sumatriptan Other (See Comments) 07/01/2007   Tape Rash 11/23/2015   Wound dressing adhesive Dermatitis 11/23/2015   Zolmitriptan Other (See Comments) 07/01/2007    Family History  Problem Relation Age of Onset   Hypertension Mother    Diabetes Mellitus II Mother    Congestive Heart Failure Father    Hypertension Father    Diabetes Mellitus II Father    Sleep apnea  Father    Sleep apnea Son    Colon cancer Neg Hx    Esophageal cancer Neg Hx    Rectal cancer Neg Hx    Stomach cancer Neg Hx    Colon polyps Neg Hx     Social History   Socioeconomic History   Marital status: Single    Spouse name: Not on file   Number of children: 3   Years of education: Not on file   Highest education level: Not on file  Occupational History   Not on file  Tobacco Use   Smoking status: Never    Passive exposure: Never   Smokeless  tobacco: Never  Vaping Use   Vaping status: Never Used  Substance and Sexual Activity   Alcohol use: Not Currently    Comment: occ   Drug use: No   Sexual activity: Not on file    Comment: Hysterectomy  Other Topics Concern   Not on file  Social History Narrative   Pt lives with son    Pt not working    Social Drivers of Corporate investment banker Strain: Not on file  Food Insecurity: Not on file  Transportation Needs: Not on file  Physical Activity: Not on file  Stress: Not on file  Social Connections: Unknown (07/19/2021)   [facility]   Social Network    Social Network: Not on file  Intimate Partner Violence: Unknown (06/10/2021)   Received from [facility]   HITS    Physically Hurt: Not on file    Insult or Talk Down To: Not on file    Threaten Physical Harm: Not on file    Scream or Curse: Not on file    Physical Exam: Vital signs in last 24 hours: @BP  (!) 134/95   Pulse 87   Temp 97.9 F (36.6 C)   Ht 5' 8 (1.727 m)   Wt 291 lb (132 kg)   SpO2 100%   BMI 44.25 kg/m  GEN: NAD EYE: Sclerae anicteric ENT: MMM CV: Non-tachycardic Pulm: CTA b/l GI: Soft, NT/ND NEURO:  Alert & Oriented x 3   Sandor Flatter, DO [facility]   10/12/2023 7:58 AM

## 2023-10-12 NOTE — Progress Notes (Signed)
 VS by DT  Pt's states no medical or surgical changes since previsit or office visit.

## 2023-10-12 NOTE — Op Note (Signed)
 [facility] Patient Name: [name] [name] Procedure Date: 10/12/2023 8:22 AM MRN: [id number] Endoscopist: Sandor Flatter , MD, [id number] Age: 48 Referring MD:  Date of Birth: 07/23/1975 Gender: Female Account #: [id number] Procedure:                Colonoscopy Indications:              Screening for colorectal malignant neoplasm, This                            is the patient's first colonoscopy Medicines:                Monitored Anesthesia Care Procedure:                Pre-Anesthesia Assessment:                           - Prior to the procedure, a History and Physical                            was performed, and patient medications and                            allergies were reviewed. The patient's tolerance of                            previous anesthesia was also reviewed. The risks                            and benefits of the procedure and the sedation                            options and risks were discussed with the patient.                            All questions were answered, and informed consent                            was obtained. Prior Anticoagulants: The patient has                            taken no anticoagulant or antiplatelet agents. ASA                            Grade Assessment: III - A patient with severe                            systemic disease. After reviewing the risks and                            benefits, the patient was deemed in satisfactory                            condition to undergo the procedure.  After obtaining informed consent, the colonoscope                            was passed under direct vision. Throughout the                            procedure, the patient's blood pressure, pulse, and                            oxygen saturations were monitored continuously. The                            Olympus Scope [id number] was introduced through the                            anus and  advanced to the the cecum, identified by                            appendiceal orifice and ileocecal valve. The                            colonoscopy was performed without difficulty. The                            patient tolerated the procedure fairly well.                            Following copious irrigation, the quality of the                            bowel preparation was adequate. The ileocecal                            valve, appendiceal orifice, and rectum were                            photographed. Scope In: 8:39:36 AM Scope Out: 9:00:43 AM Scope Withdrawal Time: 0 hours 13 minutes 49 seconds  Total Procedure Duration: 0 hours 21 minutes 7 seconds  Findings:                 The perianal and digital rectal examinations were                            normal.                           A 3 mm polyp was found in the ascending colon. The                            polyp was sessile. The polyp was removed with a                            cold snare. Resection and retrieval were complete.  Estimated blood loss was minimal.                           A few small-mouthed diverticula were found in the                            sigmoid colon.                           Non-bleeding internal hemorrhoids were found during                            retroflexion. The hemorrhoids were small. Complications:            No immediate complications. Estimated Blood Loss:     Estimated blood loss was minimal. Impression:               - One 3 mm polyp in the ascending colon, removed                            with a cold snare. Resected and retrieved.                           - Diverticulosis in the sigmoid colon.                           - Non-bleeding internal hemorrhoids. Recommendation:           - Patient has a contact number available for                            emergencies. The signs and symptoms of potential                            delayed  complications were discussed with the                            patient. Return to normal activities tomorrow.                            Written discharge instructions were provided to the                            patient.                           - Resume previous diet.                           - Continue present medications.                           - Await pathology results.                           - Repeat colonoscopy for surveillance based on  pathology results.                           - Return to GI clinic PRN. Sandor Flatter, MD 10/12/2023 9:06:09 AM

## 2023-10-12 NOTE — Patient Instructions (Signed)

## 2023-10-12 NOTE — Progress Notes (Signed)
 Vss nad trans to pacu

## 2023-10-15 ENCOUNTER — Telehealth: Payer: Self-pay

## 2023-10-15 ENCOUNTER — Other Ambulatory Visit (HOSPITAL_COMMUNITY): Payer: Self-pay

## 2023-10-15 NOTE — Telephone Encounter (Signed)
 Left message on follow up call.

## 2023-10-16 LAB — SURGICAL PATHOLOGY

## 2023-10-17 ENCOUNTER — Ambulatory Visit: Attending: Physician Assistant | Admitting: Physical Therapy

## 2023-10-17 ENCOUNTER — Encounter: Payer: Self-pay | Admitting: Physical Therapy

## 2023-10-17 ENCOUNTER — Ambulatory Visit: Admitting: Physical Therapy

## 2023-10-17 DIAGNOSIS — M25511 Pain in right shoulder: Secondary | ICD-10-CM | POA: Diagnosis present

## 2023-10-17 DIAGNOSIS — R293 Abnormal posture: Secondary | ICD-10-CM | POA: Diagnosis present

## 2023-10-17 DIAGNOSIS — M6281 Muscle weakness (generalized): Secondary | ICD-10-CM | POA: Insufficient documentation

## 2023-10-17 NOTE — Therapy (Signed)
 OUTPATIENT PHYSICAL THERAPY SHOULDER TREATMENT   Patient Name: [name] [name] MRN: [id number] DOB:[birth year], 48 y.o., female Today's Date: 10/17/2023  END OF SESSION:  PT End of Session - 10/17/23 0733     Visit Number 9    Number of Visits 17    Date for PT Re-Evaluation 11/21/23    Authorization Type [place] [employer]    PT Start Time [phone]   pt arrived late   PT Stop Time 0800    PT Time Calculation (min) 29 min             Past Medical History:  Diagnosis Date   Allergy    Anemia    Arthritis    Bipolar 1 disorder (HCC)    Sees Dr. Kermit, psychology,  [phone]   Carpal tunnel syndrome    Depression    Diabetes mellitus without complication (HCC)    Family history of adverse reaction to anesthesia    mother & son have difficulty waking   Fatigue    Ganglion of left wrist 07/26/2018   Lung nodule seen on imaging study 09/24/2013   Peripheral vascular disease (HCC)    Sleep apnea    uses cpap   Past Surgical History:  Procedure Laterality Date   ABDOMINAL HYSTERECTOMY     CHOLECYSTECTOMY N/A 06/22/2017   Procedure: LAPAROSCOPIC CHOLECYSTECTOMY;  Surgeon: Stevie Herlene Righter, MD;  Location: [facility] OR;  Service: General;  Laterality: N/A;   I & D EXTREMITY Right 09/14/2015   Procedure: IRRIGATION AND DEBRIDEMENT RIGHT MIDDLE FINGER;  Surgeon: Elsie Mussel, MD;  Location: [facility] OR;  Service: Orthopedics;  Laterality: Right;   POSTERIOR LUMBAR FUSION 2 WITH HARDWARE REMOVAL Right 08/15/2023   Procedure: ARTHROSCOPY, SHOULDER WITH DEBRIDEMENT;  Surgeon: Jerri Kay HERO, MD;  Location: [facility];  Service: Orthopedics;  Laterality: Right;   TUBAL LIGATION     Patient Active Problem List   Diagnosis Date Noted   Routine adult health maintenance 09/21/2023   Partial nontraumatic tear of right rotator cuff 08/15/2023   Impingement syndrome of right shoulder 08/15/2023   Excessive daytime sleepiness 12/27/2022   Sleep attack 12/27/2022   Nightmare disorder  12/27/2022   Snoring 12/27/2022   Chronic right shoulder pain 11/16/2022   ADHD 01/03/2022   Urine frequency 08/08/2018   OSA on CPAP 02/09/2016   Diabetes mellitus (HCC) 07/11/2012   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 W/O OBSTRUCTION 05/03/2006    PCP: Marlee Lynwood NOVAK, MD  REFERRING PROVIDER: Jule Ronal CROME, PA-C  REFERRING DIAG: [phone] (ICD-10-CM) - History of arthroscopy of right shoulder   THERAPY DIAG:  Acute pain of right shoulder  Muscle weakness (generalized)  Rationale for Evaluation and Treatment: Rehabilitation  ONSET DATE: 08/15/2023  SUBJECTIVE:  SUBJECTIVE STATEMENT: My shoulder has been okay. I have been using my arm more. I only get twinges of pain sometimes.     Hand dominance: Right  PERTINENT HISTORY: DM II, bipolar, depression  PAIN:  Are you having pain?  Yes: NPRS scale: 0/10 Worst: 10/10 Pain location: R anterior shoulder Pain description: sharp, sore Aggravating factors: OH reaching, lifting Relieving factors: rest  PRECAUTIONS: avoid resisted elbow flexion  RED FLAGS: None   WEIGHT BEARING RESTRICTIONS: No  FALLS:  Has patient fallen in last 6 months? No  LIVING ENVIRONMENT: Lives with: lives with their family Lives in: House/apartment Stairs: No Has following equipment at home: None  OCCUPATION: Not working  PLOF: Independent  PATIENT GOALS: get back   NEXT MD VISIT: 09/28/2023- rescheduled 10/05/23  OBJECTIVE:  Note: Objective measures were completed at Evaluation unless otherwise  noted.  DIAGNOSTIC FINDINGS:  See imaging   PATIENT SURVEYS:  Quick DASH: 80% disability  COGNITION: Overall cognitive status: Within functional limits for tasks assessed     SENSATION: Light touch: Impaired - anterior R UE  POSTURE: Rounded shoulder, large body habitus  UPPER EXTREMITY ROM:   Active ROM Right eval Left eval Right 09/28/23 Right  10/17/23  Shoulder flexion 60 WFL 135 145  Shoulder extension      Shoulder abduction 83 WFL 120 132  Shoulder adduction      Shoulder internal rotation  WFL Reach to right low back  Reach to right low back   Shoulder external rotation 25 WFL Reach to T2    Elbow flexion      Elbow extension      Wrist flexion      Wrist extension      Wrist ulnar deviation      Wrist radial deviation      Wrist pronation      Wrist supination      (Blank rows = not tested)  UPPER EXTREMITY MMT:  MMT Right eval Left eval Right 10/17/23  Shoulder flexion DNT 4 3+  Shoulder extension     Shoulder abduction DNT 4 3+  Shoulder adduction     Shoulder internal rotation DNT 4 4  Shoulder external rotation DNT  4  Middle trapezius     Lower trapezius     Elbow flexion     Elbow extension     Wrist flexion     Wrist extension     Wrist ulnar deviation     Wrist radial deviation     Wrist pronation     Wrist supination     Grip strength (lbs)     (Blank rows = not tested)  SHOULDER SPECIAL TESTS: DNT  JOINT MOBILITY TESTING:  GH hypomobility  PALPATION:  TTP to R anterior deltoid, R infraspinatus    TREATMENT: [facility] Adult PT Treatment:                                                DATE: 10/17/23 Therapeutic Activity: UBE level 1 2 min each direction Standing wall slide shoulder flexion for PROM x 30 sec Standing wall slide shoulder abduction for PROM x 30 sec Shoulder extension and IR with dowel AAROM x 10 each  Sleeper stretch standing at wall  Standing forward raise 2 x 10  Standing lateral raise 2 x 10  Updated  HEP  [facility] Adult PT Treatment:                                                DATE: 10/03/23 Seated OH press holding dowel 3 x 10  YTB ER bilat 2 x 10 Seated scap squeeze Seated Scaption AROM 3 x 10 Seated red band IR 15 x 2  Standing shoulder ext red band 15 x 2  Standing shoulder flexion x 10- cues for true flexion- tends to scaption Supine full dowel pullovers x 12  Sidelying shoulder abduction , horizontal abduction , flexion round of 10  Ice pack right shoulder x 10 min Nustep 15 minutes LE only level 5 concurrent with first 5 exercises.     [facility] Adult PT Treatment:                                                DATE: 09/28/23  Pulleys for flexion and scaption Standing shoulder flexion wall slides  Standing cane shoulder flexion  Standing cane shoulder abduction  Side lying Shoulder abduction pal down x 10 Side lying shoulder flexion x 10 Side lying shoulder horiz abduction  x 10  Side lying shoulder ER AROM x 5  Supine shoulder AAROM flexion with cane - lap to OH x 5  Standing Row YTB x 10 Standing shoulder ext YTB x 10 Standing shoulder ER x 10 Standing shoulder IR x 10    [facility] Adult PT Treatment:                                                DATE: 09/24/23 Therapeutic Exercise: Supine dowel chest press x 10 Supine dowel chest press and above pullovers  x 10 Side lying Shoulder abduction pal down x 10 Side lying shoulder flexion x 10 Side lying shoulder horiz abduction  x 10  Side lying shoulder ER AROM 10 x 2  Standing row 2x10 YTB Standing ext 2x10 YTB Standing ER 2x10 YTB Standing IR 2x10 YTB Standing UE ranger flexion and scaption reaching x 10 each  Seated pulleys flexion and scaption 1 minute each Standing scaption AROM- pain/ discontinued Standing short level shoulder flexion x 5     [facility] Adult PT Treatment:                                                DATE: 09/21/23 Therapeutic Exercise Supine shoulder flexion x 10 R caused pain limited range  at end  Supine dow flexion 3x10 AAROM Supine dow chest press 3x10 Seated scapular retractions 2x 10 Seated RTB pull aparts 2x10 Seated single arm RTB row Pulleys into flexion x3 min Standing row 2x10 YTB Standing ext 2x10 YTB Standing ER 2x10 YTB Standing IR 2x10 YTB     PATIENT EDUCATION: Education details: eval findings, Quick DASH, HEP, POC Person educated: Patient Education method: Explanation, Demonstration, and Handouts Education comprehension: verbalized understanding and returned demonstration  HOME EXERCISE PROGRAM: Access Code: [email or web address] URL: [email or web address] Date:  09/13/2023 Prepared by: Alm Kingdom  Exercises - Seated Scapular Retraction  - 1-2 x daily - 7 x weekly - 2 sets - 10 reps - 3-5 sec hold - Seated Shoulder Flexion Towel Slide at Table Top  - 1-2 x daily - 7 x weekly - 3 sets - 10 reps - 5 sec hold - Seated Shoulder Abduction Towel Slide at Table Top  - 1-2 x daily - 7 x weekly - 3 sets - 10 reps - 5 sec hold - Supine Shoulder Flexion AAROM with Hands Clasped  - 1-2 x daily - 7 x weekly - 3 sets - 10 reps - 5 sec hold - Standing Isometric Shoulder Internal Rotation at Doorway  - 1 x daily - 7 x weekly - 1 sets - 10 reps - 3-5 hold - Standing Isometric Shoulder External Rotation with Doorway  - 1 x daily - 7 x weekly - 3 sets - 10 reps - 3-5 hold - Supine Shoulder Flexion Extension Full Range AROM  - 1 x daily - 7 x weekly - 2 sets - 10 reps - Sidelying Shoulder Abduction Palm Forward  - 1 x daily - 7 x weekly - 2 sets - 10 reps Added 10/17/23  Standing Shoulder Extension ROM with Dowel  - 1 x daily - 7 x weekly - 1-2 sets - 10 reps - 5 hold - Standing Shoulder Internal Rotation AAROM Behind Back with Towel   - 1 x daily - 7 x weekly - 1-2 sets - 10 reps - 5 hold - Standing Shoulder Flexion to 90 Degrees with Dumbbells  - 1 x daily - 7 x weekly - 1-3 sets - 10 reps - Standing Single Arm Shoulder Abduction with Dumbbell - Palm Down  - 1 x  daily - 7 x weekly - 1-3 sets - 10 reps  ASSESSMENT:  CLINICAL IMPRESSION: Pt reports her shoulder has been doing well this last week. No pain on arrival and only has intermittent twinges. STG# 2 met. Her AROM has improved for OH reaching. See objective measures for update. Limited in shoulder IR AROM. MMT of right shoulder flexion and abduction measure at 3+/5. Saw MD who confirmed that she did not have a Bicep tenotomy and has no restrictions with PT/lifting. She arrived late today. Progressed HEP to  include shoulder IR AAROM and gravity resisted shoulder AROM for flexion and abduction. She reported discomfort, not pain.    SABRA  EVAL: Patient is a 48 y.o. F who was seen today for physical therapy evaluation and treatment s/p R shoulder arthroscopic debridement, SAD, and biceps tenotomy performed by Dr. Jerri on 08/15/2023. Physical findings are consistent with surgery and recovery time post op as she demonstrates decrease R shoulder ROM and strength. Quick DASH shows severe disability in performance of home ADLs and community activities. Pt would benefit form skilled PT services post op working on improving proximal strength and shoulder function.   OBJECTIVE IMPAIRMENTS: decreased activity tolerance, decreased endurance, decreased mobility, decreased ROM, decreased strength, postural dysfunction, and pain   ACTIVITY LIMITATIONS: carrying, lifting, reach over head, and hygiene/grooming  PARTICIPATION LIMITATIONS: meal prep, cleaning, driving, shopping, community activity, and yard work  PERSONAL FACTORS: Time since onset of injury/illness/exacerbation and 3+ comorbidities: DM II, bipolar, depression are also affecting patient's functional outcome.   REHAB POTENTIAL: Good  CLINICAL DECISION MAKING: Evolving/moderate complexity  EVALUATION COMPLEXITY: Moderate   GOALS: Goals reviewed with patient? No  SHORT TERM GOALS: Target date: 09/19/2023   Pt will  be compliant and knowledgeable with  initial HEP for improved comfort and carryover Baseline: initial HEP given  Goal status: MET  2.  Pt will self report right shoulder pain no greater than 6/10 for improved comfort and functional ability Baseline: 10/10 at worst 7/25/2: 8-9/10 this morning  10/17/23: intermittent twinges Goal status: MET   LONG TERM GOALS: Target date: 11/21/2023   Pt will decrease Quick DASH disability score to no greater than 50% as proxy for functional improvement Baseline: 80% disability  Goal status: INITIAL  2.  Pt will self report right shoulder pain no greater than 3/10 for improved comfort and functional ability Baseline: 10/10 at worst Goal status: INITIAL    3.  Pt will improve R shoulder flex to at least 140 degrees for improved functional with home and community activities  Baseline: see ROM chart Goal status: INITIAL  4.  Pt will improve R shoulder MMT to at least 4/5 for improved functional ability and shoulder stability with OH motion Baseline: DNT Goal status: INITIAL   PLAN:  PT FREQUENCY: 2x/week  PT DURATION: 8 weeks  PLANNED INTERVENTIONS: 97164- PT Re-evaluation, 97110-Therapeutic exercises, 97530- Therapeutic activity, 97112- Neuromuscular re-education, 97535- Self Care, 02859- Manual therapy, G0283- Electrical stimulation (unattended), [id number]- Electrical stimulation (manual), 97016- Vasopneumatic device, 20560 (1-2 muscles), 20561 (3+ muscles)- Dry Needling, Cryotherapy, and Moist heat  PLAN FOR NEXT SESSION: assess HEP response, progress flexion and strength, improve comfort with OH motion   Harlene Persons, PTA 10/17/23 8:07 AM Phone: [phone] Fax: [phone]

## 2023-10-19 ENCOUNTER — Ambulatory Visit

## 2023-10-19 ENCOUNTER — Ambulatory Visit (HOSPITAL_COMMUNITY)
Admission: RE | Admit: 2023-10-19 | Discharge: 2023-10-19 | Disposition: A | Discharge status: Home / Self Care | Source: Ambulatory Visit | Attending: Family Medicine | Admitting: Family Medicine

## 2023-10-19 DIAGNOSIS — R293 Abnormal posture: Secondary | ICD-10-CM

## 2023-10-19 DIAGNOSIS — E785 Hyperlipidemia, unspecified: Secondary | ICD-10-CM | POA: Diagnosis not present

## 2023-10-19 DIAGNOSIS — R0600 Dyspnea, unspecified: Secondary | ICD-10-CM | POA: Diagnosis present

## 2023-10-19 DIAGNOSIS — G4730 Sleep apnea, unspecified: Secondary | ICD-10-CM | POA: Insufficient documentation

## 2023-10-19 DIAGNOSIS — I10 Essential (primary) hypertension: Secondary | ICD-10-CM | POA: Insufficient documentation

## 2023-10-19 DIAGNOSIS — E119 Type 2 diabetes mellitus without complications: Secondary | ICD-10-CM | POA: Diagnosis not present

## 2023-10-19 DIAGNOSIS — M6281 Muscle weakness (generalized): Secondary | ICD-10-CM

## 2023-10-19 DIAGNOSIS — M25511 Pain in right shoulder: Secondary | ICD-10-CM | POA: Diagnosis not present

## 2023-10-19 LAB — ECHOCARDIOGRAM COMPLETE
AR max vel: 2.54 cm2
AV Area VTI: 2.33 cm2
AV Area mean vel: 2.47 cm2
AV Mean grad: 2.5 mmHg
AV Peak grad: 4.3 mmHg
Ao pk vel: 1.03 m/s
Area-P 1/2: 4.49 cm2
Calc EF: 60.3 %
S' Lateral: 2.7 cm
Single Plane A2C EF: 59.6 %
Single Plane A4C EF: 61.4 %

## 2023-10-19 NOTE — Therapy (Signed)
 OUTPATIENT PHYSICAL THERAPY SHOULDER TREATMENT   Patient Name: [name] [name] MRN: [id number] DOB:10/24/1975, 48 y.o., female Today's Date: 10/19/2023  END OF SESSION:  PT End of Session - 10/19/23 0803     Visit Number 10    Number of Visits 17    Date for PT Re-Evaluation 11/21/23    Authorization Type [place] [employer]    PT Start Time 0803    PT Stop Time 0844    PT Time Calculation (min) 41 min              Past Medical History:  Diagnosis Date   Allergy    Anemia    Arthritis    Bipolar 1 disorder (HCC)    Sees Dr. Kermit, psychology,  [phone]   Carpal tunnel syndrome    Depression    Diabetes mellitus without complication (HCC)    Family history of adverse reaction to anesthesia    mother & son have difficulty waking   Fatigue    Ganglion of left wrist 07/26/2018   Lung nodule seen on imaging study 09/24/2013   Peripheral vascular disease (HCC)    Sleep apnea    uses cpap   Past Surgical History:  Procedure Laterality Date   ABDOMINAL HYSTERECTOMY     CHOLECYSTECTOMY N/A 06/22/2017   Procedure: LAPAROSCOPIC CHOLECYSTECTOMY;  Surgeon: Stevie Herlene Righter, MD;  Location: [facility] OR;  Service: General;  Laterality: N/A;   I & D EXTREMITY Right 09/14/2015   Procedure: IRRIGATION AND DEBRIDEMENT RIGHT MIDDLE FINGER;  Surgeon: Elsie Mussel, MD;  Location: [facility];  Service: Orthopedics;  Laterality: Right;   POSTERIOR LUMBAR FUSION 2 WITH HARDWARE REMOVAL Right 08/15/2023   Procedure: ARTHROSCOPY, SHOULDER WITH DEBRIDEMENT;  Surgeon: Jerri Kay HERO, MD;  Location: [facility];  Service: Orthopedics;  Laterality: Right;   TUBAL LIGATION     Patient Active Problem List   Diagnosis Date Noted   Routine adult health maintenance 09/21/2023   Partial nontraumatic tear of right rotator cuff 08/15/2023   Impingement syndrome of right shoulder 08/15/2023   Excessive daytime sleepiness 12/27/2022   Sleep attack 12/27/2022   Nightmare disorder 12/27/2022    Snoring 12/27/2022   Chronic right shoulder pain 11/16/2022   ADHD 01/03/2022   Urine frequency 08/08/2018   OSA on CPAP 02/09/2016   Diabetes mellitus (HCC) 07/11/2012   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 W/O OBSTRUCTION 05/03/2006    PCP: Marlee Lynwood NOVAK, MD  REFERRING PROVIDER: Jule Ronal CROME, PA-C  REFERRING DIAG: [phone] (ICD-10-CM) - History of arthroscopy of right shoulder   THERAPY DIAG:  Acute pain of right shoulder  Muscle weakness (generalized)  Abnormal posture  Rationale for Evaluation and Treatment: Rehabilitation  ONSET DATE: 08/15/2023  SUBJECTIVE:  SUBJECTIVE STATEMENT: Pt presents to PT noting the shoulder is feeling good. Has been compliant with HEP.   Hand dominance: Right  PERTINENT HISTORY: DM II, bipolar, depression  PAIN:  Are you having pain?  Yes: NPRS scale: 0/10 Worst: 10/10 Pain location: R anterior shoulder Pain description: sharp, sore Aggravating factors: OH reaching, lifting Relieving factors: rest  PRECAUTIONS: avoid resisted elbow flexion  RED FLAGS: None   WEIGHT BEARING RESTRICTIONS: No  FALLS:  Has patient fallen in last 6 months? No  LIVING ENVIRONMENT: Lives with: lives with their family Lives in: House/apartment Stairs: No Has following equipment at home: None  OCCUPATION: Not working  PLOF: Independent  PATIENT GOALS: get back   NEXT MD VISIT: 09/28/2023- rescheduled 10/05/23  OBJECTIVE:  Note: Objective measures were completed at Evaluation unless otherwise  noted.  DIAGNOSTIC FINDINGS:  See imaging   PATIENT SURVEYS:  Quick DASH: 80% disability  COGNITION: Overall cognitive status: Within functional limits for tasks assessed     SENSATION: Light touch: Impaired - anterior R UE  POSTURE: Rounded shoulder, large body habitus  UPPER EXTREMITY ROM:   Active ROM Right eval Left eval Right 09/28/23 Right  10/17/23  Shoulder flexion 60 WFL 135 145  Shoulder extension      Shoulder abduction 83 WFL 120 132  Shoulder adduction      Shoulder internal rotation  WFL Reach to right low back  Reach to right low back   Shoulder external rotation 25 WFL Reach to T2    Elbow flexion      Elbow extension      Wrist flexion      Wrist extension      Wrist ulnar deviation      Wrist radial deviation      Wrist pronation      Wrist supination      (Blank rows = not tested)  UPPER EXTREMITY MMT:  MMT Right eval Left eval Right 10/17/23  Shoulder flexion DNT 4 3+  Shoulder extension     Shoulder abduction DNT 4 3+  Shoulder adduction     Shoulder internal rotation DNT 4 4  Shoulder external rotation DNT  4  Middle trapezius     Lower trapezius     Elbow flexion     Elbow extension     Wrist flexion     Wrist extension     Wrist ulnar deviation     Wrist radial deviation     Wrist pronation     Wrist supination     Grip strength (lbs)     (Blank rows = not tested)  SHOULDER SPECIAL TESTS: DNT  JOINT MOBILITY TESTING:  GH hypomobility  PALPATION:  TTP to R anterior deltoid, R infraspinatus    TREATMENT: [facility] Adult PT Treatment:                                                DATE: 10/19/23 Therapeutic Activity: UBE level 1.5 x 2 min each direction Inclined shoulder flexion 3x10 2# ea FM row 3x10 20# FM ext 2x10 20# Shoulder ER 2x10 YTB Shoulder abd 2x10 1# - standing palm down Standing IR floss with dow 2x10 Seated low row 2x10 30# Seated lat pulldown 2x10 30#  [facility] Adult PT Treatment:  DATE: 10/17/23 Therapeutic Activity: UBE level 1 2 min each direction Standing wall slide shoulder flexion for PROM x 30 sec Standing wall slide shoulder abduction for PROM x 30 sec Shoulder extension and IR with dowel AAROM x 10 each  Sleeper stretch standing at wall  Standing forward raise 2 x 10  Standing lateral raise 2 x 10  Updated HEP  [facility] Adult PT Treatment:                                                DATE: 10/03/23 Seated OH press holding dowel 3 x 10  YTB ER bilat 2 x 10 Seated scap squeeze Seated Scaption AROM 3 x 10 Seated red band IR 15 x 2  Standing shoulder ext red band 15 x 2  Standing shoulder flexion x 10- cues for true flexion- tends to scaption Supine full dowel pullovers x 12  Sidelying shoulder abduction , horizontal abduction , flexion round of 10  Ice pack right shoulder x 10 min Nustep 15 minutes LE only level 5 concurrent with first 5 exercises.    PATIENT EDUCATION: Education details: eval findings, Quick DASH, HEP, POC Person educated: Patient Education method: Explanation, Demonstration, and Handouts Education comprehension: verbalized understanding and returned demonstration  HOME EXERCISE PROGRAM: Access Code: [email or web address] URL: [email or web address] Date: 09/13/2023 Prepared by: Alm Kingdom  Exercises - Seated Scapular Retraction  - 1-2 x daily - 7 x weekly - 2 sets - 10 reps - 3-5 sec hold - Seated Shoulder Flexion Towel Slide at Table Top  - 1-2 x daily - 7 x weekly - 3 sets - 10 reps - 5 sec hold - Seated Shoulder Abduction Towel Slide at Table Top  - 1-2 x daily - 7 x weekly - 3 sets - 10 reps - 5 sec hold - Supine Shoulder Flexion AAROM with Hands Clasped  - 1-2 x daily - 7 x weekly - 3 sets - 10 reps - 5 sec hold - Standing Isometric Shoulder Internal Rotation at Doorway  - 1 x daily - 7 x weekly - 1 sets - 10 reps - 3-5 hold - Standing Isometric Shoulder External Rotation with Doorway  - 1 x daily - 7 x  weekly - 3 sets - 10 reps - 3-5 hold - Supine Shoulder Flexion Extension Full Range AROM  - 1 x daily - 7 x weekly - 2 sets - 10 reps - Sidelying Shoulder Abduction Palm Forward  - 1 x daily - 7 x weekly - 2 sets - 10 reps Added 10/17/23  Standing Shoulder Extension ROM with Dowel  - 1 x daily - 7 x weekly - 1-2 sets - 10 reps - 5 hold - Standing Shoulder Internal Rotation AAROM Behind Back with Towel   - 1 x daily - 7 x weekly - 1-2 sets - 10 reps - 5 hold - Standing Shoulder Flexion to 90 Degrees with Dumbbells  - 1 x daily - 7 x weekly - 1-3 sets - 10 reps - Standing Single Arm Shoulder Abduction with Dumbbell - Palm Down  - 1 x daily - 7 x weekly - 1-3 sets - 10 reps  ASSESSMENT:  CLINICAL IMPRESSION: Pt was able to complete prescribed exercises with no adverse effect. Exercises focused on improving R shoulder function especially with OH lifting and home ADLs. Continues to benefit from skilled PT  services, will progress as tolerated per POC.   EVAL: Patient is a 48 y.o. F who was seen today for physical therapy evaluation and treatment s/p R shoulder arthroscopic debridement, SAD, and biceps tenotomy performed by Dr. Jerri on 08/15/2023. Physical findings are consistent with surgery and recovery time post op as she demonstrates decrease R shoulder ROM and strength. Quick DASH shows severe disability in performance of home ADLs and community activities. Pt would benefit form skilled PT services post op working on improving proximal strength and shoulder function.   OBJECTIVE IMPAIRMENTS: decreased activity tolerance, decreased endurance, decreased mobility, decreased ROM, decreased strength, postural dysfunction, and pain   ACTIVITY LIMITATIONS: carrying, lifting, reach over head, and hygiene/grooming  PARTICIPATION LIMITATIONS: meal prep, cleaning, driving, shopping, community activity, and yard work  PERSONAL FACTORS: Time since onset of injury/illness/exacerbation and 3+ comorbidities: DM II,  bipolar, depression are also affecting patient's functional outcome.   REHAB POTENTIAL: Good  CLINICAL DECISION MAKING: Evolving/moderate complexity  EVALUATION COMPLEXITY: Moderate   GOALS: Goals reviewed with patient? No  SHORT TERM GOALS: Target date: 09/19/2023   Pt will be compliant and knowledgeable with initial HEP for improved comfort and carryover Baseline: initial HEP given  Goal status: MET  2.  Pt will self report right shoulder pain no greater than 6/10 for improved comfort and functional ability Baseline: 10/10 at worst 7/25/2: 8-9/10 this morning  10/17/23: intermittent twinges Goal status: MET   LONG TERM GOALS: Target date: 11/21/2023   Pt will decrease Quick DASH disability score to no greater than 50% as proxy for functional improvement Baseline: 80% disability  Goal status: INITIAL  2.  Pt will self report right shoulder pain no greater than 3/10 for improved comfort and functional ability Baseline: 10/10 at worst Goal status: INITIAL    3.  Pt will improve R shoulder flex to at least 140 degrees for improved functional with home and community activities  Baseline: see ROM chart Goal status: INITIAL  4.  Pt will improve R shoulder MMT to at least 4/5 for improved functional ability and shoulder stability with OH motion Baseline: DNT Goal status: INITIAL   PLAN:  PT FREQUENCY: 2x/week  PT DURATION: 8 weeks  PLANNED INTERVENTIONS: 97164- PT Re-evaluation, 97110-Therapeutic exercises, 97530- Therapeutic activity, [id number]- Neuromuscular re-education, 97535- Self Care, 02859- Manual therapy, G0283- Electrical stimulation (unattended), [id number]- Electrical stimulation (manual), 97016- Vasopneumatic device, 20560 (1-2 muscles), 20561 (3+ muscles)- Dry Needling, Cryotherapy, and Moist heat  PLAN FOR NEXT SESSION: assess HEP response, progress flexion and strength, improve comfort with OH motion   Alm JAYSON Kingdom PT  10/19/23 8:56 AM

## 2023-10-20 ENCOUNTER — Ambulatory Visit: Payer: Self-pay

## 2023-10-22 ENCOUNTER — Ambulatory Visit: Payer: Self-pay | Admitting: Gastroenterology

## 2023-10-23 ENCOUNTER — Other Ambulatory Visit (HOSPITAL_COMMUNITY): Payer: Self-pay

## 2023-10-23 ENCOUNTER — Telehealth: Payer: Self-pay | Admitting: Physical Medicine and Rehabilitation

## 2023-10-23 ENCOUNTER — Other Ambulatory Visit: Payer: Self-pay

## 2023-10-23 ENCOUNTER — Telehealth: Payer: Self-pay | Admitting: Pharmacist

## 2023-10-23 MED ORDER — TRULICITY 0.75 MG/0.5ML ~~LOC~~ SOAJ
0.7500 mg | SUBCUTANEOUS | 3 refills | Status: DC
  Filled 2023-10-23 – 2023-10-29 (×2): qty 2, 28d supply, fill #0
  Filled 2023-12-13: qty 2, 28d supply, fill #1
  Filled 2024-01-11: qty 2, 28d supply, fill #2
  Filled 2024-02-11: qty 2, 28d supply, fill #3

## 2023-10-23 NOTE — Telephone Encounter (Signed)
 Patient contacted for follow-up of glucose - low readings with Ozempic  (semaglutide ).   Patient reports low glucose readings with use of Ozempic  (semaglutide ) and metformin .  Following discussion of options, patient agree to a switch/trial of Trulicity  (dulaglutide ) New prescription provided.   STOP Ozempic  (semaglutide )   Provided Libre3 sensor for use - placed at front desk for pick-up later today.   Rescheduled appointment for 8 days to evaluate glucose control / tolerability.   Total time with patient call and documentation of interaction: 12 minutes.

## 2023-10-23 NOTE — Telephone Encounter (Signed)
 Patient called and wants you to call her about pain medication. CB#[phone]

## 2023-10-23 NOTE — Telephone Encounter (Signed)
 Patient calls nurse line regarding previous mychart messages.   Advised patient that she could reach out to orthopedic specialists for continued issues with back pain.   Patient reports low blood sugar on Sunday down to 53. She reports that blood sugar normalized after eating a meal. Only symptom that was present was fatigue.   She reports that the last time she was on Ozempic , blood sugar levels dropped like this.   She reports that freestyle sensor fell out yesterday.   She does not have glucometer at home to check levels.   Scheduled patient DM follow up and to further discuss medications on Thursday with Dr. Koval.   Chiquita JAYSON English, RN

## 2023-10-24 ENCOUNTER — Other Ambulatory Visit: Payer: Self-pay

## 2023-10-24 ENCOUNTER — Other Ambulatory Visit (HOSPITAL_COMMUNITY): Payer: Self-pay

## 2023-10-24 ENCOUNTER — Ambulatory Visit: Admitting: Physical Therapy

## 2023-10-24 ENCOUNTER — Encounter: Payer: Self-pay | Admitting: Physical Therapy

## 2023-10-24 DIAGNOSIS — M25511 Pain in right shoulder: Secondary | ICD-10-CM | POA: Diagnosis not present

## 2023-10-24 DIAGNOSIS — M6281 Muscle weakness (generalized): Secondary | ICD-10-CM

## 2023-10-24 NOTE — Therapy (Signed)
 OUTPATIENT PHYSICAL THERAPY SHOULDER TREATMENT   Patient Name: [name] [name] MRN: [id number] DOB:Nov 23, 1975, 48 y.o., female Today's Date: 10/24/2023  END OF SESSION:  PT End of Session - 10/24/23 0719     Visit Number 11    Number of Visits 17    Date for PT Re-Evaluation 11/21/23    Authorization Type [employer] [employer]    PT Start Time 0716    PT Stop Time 0754    PT Time Calculation (min) 38 min              Past Medical History:  Diagnosis Date   Allergy    Anemia    Arthritis    Bipolar 1 disorder (HCC)    Sees Dr. Kermit, psychology,  [phone]   Carpal tunnel syndrome    Depression    Diabetes mellitus without complication (HCC)    Family history of adverse reaction to anesthesia    mother & son have difficulty waking   Fatigue    Ganglion of left wrist 07/26/2018   Lung nodule seen on imaging study 09/24/2013   Peripheral vascular disease (HCC)    Sleep apnea    uses cpap   Past Surgical History:  Procedure Laterality Date   ABDOMINAL HYSTERECTOMY     CHOLECYSTECTOMY N/A 06/22/2017   Procedure: LAPAROSCOPIC CHOLECYSTECTOMY;  Surgeon: Stevie Herlene Righter, MD;  Location: [facility] OR;  Service: General;  Laterality: N/A;   I & D EXTREMITY Right 09/14/2015   Procedure: IRRIGATION AND DEBRIDEMENT RIGHT MIDDLE FINGER;  Surgeon: Elsie Mussel, MD;  Location: [facility] OR;  Service: Orthopedics;  Laterality: Right;   POSTERIOR LUMBAR FUSION 2 WITH HARDWARE REMOVAL Right 08/15/2023   Procedure: ARTHROSCOPY, SHOULDER WITH DEBRIDEMENT;  Surgeon: Jerri Kay HERO, MD;  Location: [facility];  Service: Orthopedics;  Laterality: Right;   TUBAL LIGATION     Patient Active Problem List   Diagnosis Date Noted   Routine adult health maintenance 09/21/2023   Partial nontraumatic tear of right rotator cuff 08/15/2023   Impingement syndrome of right shoulder 08/15/2023   Excessive daytime sleepiness 12/27/2022   Sleep attack 12/27/2022   Nightmare disorder 12/27/2022    Snoring 12/27/2022   Chronic right shoulder pain 11/16/2022   ADHD 01/03/2022   Urine frequency 08/08/2018   OSA on CPAP 02/09/2016   Diabetes mellitus (HCC) 07/11/2012   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 W/O OBSTRUCTION 05/03/2006    PCP: Marlee Lynwood NOVAK, MD  REFERRING PROVIDER: Jule Ronal CROME, PA-C  REFERRING DIAG: [phone] (ICD-10-CM) - History of arthroscopy of right shoulder   THERAPY DIAG:  Acute pain of right shoulder  Muscle weakness (generalized)  Rationale for Evaluation and Treatment: Rehabilitation  ONSET DATE: 08/15/2023  SUBJECTIVE:  SUBJECTIVE STATEMENT: Pt presents to PT noting the shoulder is feeling good. Has been compliant with HEP.   Hand dominance: Right  PERTINENT HISTORY: DM II, bipolar, depression  PAIN:  Are you having pain?  Yes: NPRS scale: 0/10 Worst: 10/10 Pain location: R anterior shoulder Pain description: sharp, sore Aggravating factors: OH reaching, lifting Relieving factors: rest  PRECAUTIONS: avoid resisted elbow flexion  RED FLAGS: None   WEIGHT BEARING RESTRICTIONS: No  FALLS:  Has patient fallen in last 6 months? No  LIVING ENVIRONMENT: Lives with: lives with their family Lives in: House/apartment Stairs: No Has following equipment at home: None  OCCUPATION: Not working  PLOF: Independent  PATIENT GOALS: get back   NEXT MD VISIT: 09/28/2023- rescheduled 10/05/23  OBJECTIVE:  Note: Objective measures were completed at Evaluation unless otherwise noted.  DIAGNOSTIC FINDINGS:   See imaging   PATIENT SURVEYS:  Quick DASH: 80% disability  COGNITION: Overall cognitive status: Within functional limits for tasks assessed     SENSATION: Light touch: Impaired - anterior R UE  POSTURE: Rounded shoulder, large body habitus  UPPER EXTREMITY ROM:   Active ROM Right eval Left eval Right 09/28/23 Right  10/17/23  Shoulder flexion 60 WFL 135 145  Shoulder extension      Shoulder abduction 83 WFL 120 132  Shoulder adduction      Shoulder internal rotation  WFL Reach to right low back  Reach to right low back   Shoulder external rotation 25 WFL Reach to T2    Elbow flexion      Elbow extension      Wrist flexion      Wrist extension      Wrist ulnar deviation      Wrist radial deviation      Wrist pronation      Wrist supination      (Blank rows = not tested)  UPPER EXTREMITY MMT:  MMT Right eval Left eval Right 10/17/23  Shoulder flexion DNT 4 3+  Shoulder extension     Shoulder abduction DNT 4 3+  Shoulder adduction     Shoulder internal rotation DNT 4 4  Shoulder external rotation DNT  4  Middle trapezius     Lower trapezius     Elbow flexion     Elbow extension     Wrist flexion     Wrist extension     Wrist ulnar deviation     Wrist radial deviation     Wrist pronation     Wrist supination     Grip strength (lbs)     (Blank rows = not tested)  SHOULDER SPECIAL TESTS: DNT  JOINT MOBILITY TESTING:  GH hypomobility  PALPATION:  TTP to R anterior deltoid, R infraspinatus    TREATMENT: [facility] Adult PT Treatment:                                                DATE: 10/24/23 Therapeutic Exercise/Activity: UBE level 2  2 minutes each direction  FM row 20#  10 x 3  FM ext 20# 10 x 3  Red band 10 x 3 ER  Red band 10 x 3 IR  Standing lateral raise 1# 2  x 10  Standing forward raise  1# 2 x 10  IR flossing with dowel x 10  IR AAROM with dowel x 10  Supine dowel pullover x 10  Passive shoulder flexion, abduction, IR , ER       [facility] Adult PT Treatment:                                                DATE: 10/19/23 Therapeutic Activity: UBE level 1.5 x 2 min each direction Inclined shoulder flexion 3x10 2# ea FM row 3x10 20# FM ext 2x10 20# Shoulder ER 2x10 YTB Shoulder abd 2x10 1# - standing palm down Standing IR floss with dow 2x10 Seated low row 2x10 30# Seated lat pulldown 2x10 30#  [facility] Adult PT Treatment:                                                DATE: 10/17/23 Therapeutic Activity: UBE level 1 2 min each direction Standing wall slide shoulder flexion for PROM x 30 sec Standing wall slide shoulder abduction for PROM x 30 sec Shoulder extension and IR with dowel AAROM x 10 each  Sleeper stretch standing at wall  Standing forward raise 2 x 10  Standing lateral raise 2 x 10  Updated HEP  [facility] Adult PT Treatment:                                                DATE: 10/03/23 Seated OH press holding dowel 3 x 10  YTB ER bilat 2 x 10 Seated scap squeeze Seated Scaption AROM 3 x 10 Seated red band IR 15 x 2  Standing shoulder ext red band 15 x 2  Standing shoulder flexion x 10- cues for true flexion- tends to scaption Supine full dowel pullovers x 12  Sidelying shoulder abduction , horizontal abduction , flexion round of 10  Ice pack right shoulder x 10 min Nustep 15 minutes LE only level 5 concurrent with first 5 exercises.    PATIENT EDUCATION: Education details: eval findings, Quick DASH, HEP, POC Person educated: Patient Education method: Explanation, Demonstration, and Handouts Education comprehension: verbalized understanding and returned demonstration  HOME EXERCISE PROGRAM: Access Code: [email or web address] URL: [email or web address] Date: 09/13/2023 Prepared by: Alm Kingdom  Exercises - Seated Scapular Retraction  - 1-2 x daily - 7 x weekly - 2 sets - 10 reps - 3-5 sec hold - Seated Shoulder Flexion Towel Slide at Table Top  - 1-2 x daily - 7 x weekly - 3 sets - 10 reps  - 5 sec hold - Seated Shoulder Abduction Towel Slide at Table Top  - 1-2 x daily - 7 x weekly - 3 sets - 10 reps - 5 sec hold - Supine Shoulder Flexion AAROM with Hands Clasped  - 1-2 x daily - 7 x weekly - 3 sets - 10 reps - 5 sec hold - Standing Isometric Shoulder Internal Rotation at Doorway  - 1 x daily - 7 x weekly - 1 sets - 10 reps - 3-5 hold - Standing Isometric Shoulder External Rotation with Doorway  - 1 x daily - 7 x weekly - 3 sets - 10 reps - 3-5 hold - Supine Shoulder Flexion Extension Full Range AROM  -  1 x daily - 7 x weekly - 2 sets - 10 reps - Sidelying Shoulder Abduction Palm Forward  - 1 x daily - 7 x weekly - 2 sets - 10 reps Added 10/17/23  Standing Shoulder Extension ROM with Dowel  - 1 x daily - 7 x weekly - 1-2 sets - 10 reps - 5 hold - Standing Shoulder Internal Rotation AAROM Behind Back with Towel   - 1 x daily - 7 x weekly - 1-2 sets - 10 reps - 5 hold - Standing Shoulder Flexion to 90 Degrees with Dumbbells  - 1 x daily - 7 x weekly - 1-3 sets - 10 reps - Standing Single Arm Shoulder Abduction with Dumbbell - Palm Down  - 1 x daily - 7 x weekly - 1-3 sets - 10 reps  ASSESSMENT:  CLINICAL IMPRESSION: Pt was able to complete prescribed exercises with no adverse effect. Exercises focused on improving R shoulder function especially with OH lifting and home ADLs. Continues to benefit from skilled PT services, will progress as tolerated per POC.   EVAL: Patient is a 48 y.o. F who was seen today for physical therapy evaluation and treatment s/p R shoulder arthroscopic debridement, SAD, and biceps tenotomy performed by Dr. Jerri on 08/15/2023. Physical findings are consistent with surgery and recovery time post op as she demonstrates decrease R shoulder ROM and strength. Quick DASH shows severe disability in performance of home ADLs and community activities. Pt would benefit form skilled PT services post op working on improving proximal strength and shoulder function.   OBJECTIVE  IMPAIRMENTS: decreased activity tolerance, decreased endurance, decreased mobility, decreased ROM, decreased strength, postural dysfunction, and pain   ACTIVITY LIMITATIONS: carrying, lifting, reach over head, and hygiene/grooming  PARTICIPATION LIMITATIONS: meal prep, cleaning, driving, shopping, community activity, and yard work  PERSONAL FACTORS: Time since onset of injury/illness/exacerbation and 3+ comorbidities: DM II, bipolar, depression are also affecting patient's functional outcome.   REHAB POTENTIAL: Good  CLINICAL DECISION MAKING: Evolving/moderate complexity  EVALUATION COMPLEXITY: Moderate   GOALS: Goals reviewed with patient? No  SHORT TERM GOALS: Target date: 09/19/2023   Pt will be compliant and knowledgeable with initial HEP for improved comfort and carryover Baseline: initial HEP given  Goal status: MET  2.  Pt will self report right shoulder pain no greater than 6/10 for improved comfort and functional ability Baseline: 10/10 at worst 7/25/2: 8-9/10 this morning  10/17/23: intermittent twinges Goal status: MET   LONG TERM GOALS: Target date: 11/21/2023   Pt will decrease Quick DASH disability score to no greater than 50% as proxy for functional improvement Baseline: 80% disability  Goal status: INITIAL  2.  Pt will self report right shoulder pain no greater than 3/10 for improved comfort and functional ability Baseline: 10/10 at worst Goal status: INITIAL    3.  Pt will improve R shoulder flex to at least 140 degrees for improved functional with home and community activities  Baseline: see ROM chart Goal status: INITIAL  4.  Pt will improve R shoulder MMT to at least 4/5 for improved functional ability and shoulder stability with OH motion Baseline: DNT Goal status: INITIAL   PLAN:  PT FREQUENCY: 2x/week  PT DURATION: 8 weeks  PLANNED INTERVENTIONS: 97164- PT Re-evaluation, 97110-Therapeutic exercises, 97530- Therapeutic activity, [id number]-  Neuromuscular re-education, 97535- Self Care, 02859- Manual therapy, [id number]- Electrical stimulation (unattended), [id number]- Electrical stimulation (manual), 97016- Vasopneumatic device, 20560 (1-2 muscles), 20561 (3+ muscles)- Dry Needling, Cryotherapy, and Moist heat  PLAN  FOR NEXT SESSION: assess HEP response, progress flexion and strength, improve comfort with OH motion   Harlene Persons, PTA 10/24/23 7:59 AM Phone: [phone] Fax: [phone]

## 2023-10-25 ENCOUNTER — Ambulatory Visit: Admitting: Pharmacist

## 2023-10-25 ENCOUNTER — Telehealth: Payer: Self-pay

## 2023-10-26 ENCOUNTER — Ambulatory Visit: Admitting: Physical Therapy

## 2023-10-26 NOTE — Telephone Encounter (Signed)
 Prior authorization submitted for TRULICITY  0.75MG  to [facility] MEDICAID via Latent.   Key: [id number]

## 2023-10-29 ENCOUNTER — Other Ambulatory Visit: Payer: Self-pay

## 2023-10-29 ENCOUNTER — Other Ambulatory Visit (HOSPITAL_COMMUNITY): Payer: Self-pay

## 2023-10-29 DIAGNOSIS — K5900 Constipation, unspecified: Secondary | ICD-10-CM

## 2023-10-29 MED ORDER — POLYETHYLENE GLYCOL 3350 17 GM/SCOOP PO POWD
17.0000 g | Freq: Every day | ORAL | 0 refills | Status: DC
  Filled 2023-10-29: qty 238, 14d supply, fill #0
  Filled 2023-12-13: qty 238, 14d supply, fill #1
  Filled 2023-12-25: qty 510, 30d supply, fill #2

## 2023-10-29 NOTE — Telephone Encounter (Signed)
 Pharmacy Patient Advocate Encounter  Received notification from [facility] MEDICAID that Prior Authorization for TRULICITY  0.75MG  has been APPROVED from 10/26/23 to 10/25/24   PA #/Case ID/Reference #: [id number]

## 2023-10-30 ENCOUNTER — Other Ambulatory Visit (HOSPITAL_COMMUNITY): Payer: Self-pay

## 2023-10-30 ENCOUNTER — Ambulatory Visit: Admitting: Physical Medicine and Rehabilitation

## 2023-10-30 ENCOUNTER — Encounter: Payer: Self-pay | Admitting: Physical Medicine and Rehabilitation

## 2023-10-30 DIAGNOSIS — M25511 Pain in right shoulder: Secondary | ICD-10-CM | POA: Diagnosis not present

## 2023-10-30 DIAGNOSIS — M5441 Lumbago with sciatica, right side: Secondary | ICD-10-CM

## 2023-10-30 DIAGNOSIS — G8929 Other chronic pain: Secondary | ICD-10-CM

## 2023-10-30 DIAGNOSIS — M5442 Lumbago with sciatica, left side: Secondary | ICD-10-CM

## 2023-10-30 DIAGNOSIS — M7918 Myalgia, other site: Secondary | ICD-10-CM | POA: Diagnosis not present

## 2023-10-30 DIAGNOSIS — M797 Fibromyalgia: Secondary | ICD-10-CM

## 2023-10-30 NOTE — Progress Notes (Unsigned)
 Pain Scale   Average Pain 8 Patient advising she has chronic lower back pain radiating to bilateral hips. Pain is constant.        +Driver, -BT, -Dye Allergies.

## 2023-10-30 NOTE — Progress Notes (Unsigned)
 [name] [name] - 48 y.o. female MRN [id number]  Date of birth: May 13, 1975  Office Visit Note: Visit Date: 10/30/2023 PCP: Jerrie Gathers, DO Referred by: Jerrie Gathers, DO  Subjective: Chief Complaint  Patient presents with   Lower Back - Pain   HPI: [name] [name] is a 48 y.o. female who comes in today for evaluation of chronic, worsening and severe bilateral lower back pain radiating to hips, groin and down legs. Pain ongoing for over a year. Her pain is constant. No specific aggravating factors. She describes her pain as sharp and shooting sensation, currently rates as 9 out of 10. Some relief of pain with home exercise regimen, rest and use of medications. History of formal physical therapy with some relief of pain. States she does take Tramadol  infrequently for body pain. Lumbar MRI imaging from April of 2025 shows   multi level degenerative changes, multilevel disc desiccation and moderate facet arthrosis is present most notably at L3-L4 and L4-L5. No high grade spinal canal stenosis noted. She was last seen in our office in April of this year, we discussed diagnostic lumbar epidural steroid injection at that time. She reports intolerance to steroid injections, specifically shoulder injections in the past. States these steroid injections cause her pain to become significantly worse. She is managed from orthopedic standpoint by Dr. Ozell Cummins. Patient denies focal weakness, numbness and tingling. No recent trauma or falls.   Patient does have history of bipolar disorder, anxiety, ADHD, diabetes mellitus and morbid obesity. There are several allergies/intolerances listed to medications.      Review of Systems  Musculoskeletal:  Positive for back pain, joint pain and myalgias.  Neurological:  Negative for tingling, sensory change, focal weakness and weakness.  All other systems reviewed and are negative.  Otherwise per HPI.  Assessment & Plan: Visit Diagnoses:    ICD-10-CM    1. Chronic bilateral low back pain with bilateral sciatica  M54.42 Ambulatory referral to Physical Therapy   M54.41    G89.29     2. Myofascial pain syndrome  M79.18 Ambulatory referral to Physical Therapy    3. Fibromyalgia  M79.7 Ambulatory referral to Physical Therapy    4. Chronic right shoulder pain  M25.511 Ambulatory referral to Physical Therapy   G89.29        Plan: Findings:  Chronic, worsening and severe bilateral lower back pain radiating to hips, groin and down legs. Patient continues to have severe pain despite good conservative therapies such as formal physical therapy, home exercise regimen, rest and use of medications. Patients clinical presentation and exam are complex. Recent lumbar MRI imaging does not directly correlate with her symptoms, no severe nerve impingement, no high grade spinal canal stenosis noted. The biggest findings is facet arthropathy noted at L3-L4 and L4-L5. We discussed trying diagnostic injection during our last office visit, however patient is very adamant about avoiding injections as she feels these procedures cause her increased pain. I did have her complete a fibromyalgia screening in the office today as we discussed her whole body pain. Widespread pain index and symptom severity scoring does meet diagnostic criteria for fibromyalgia. Although we do not solely manage fibromyalgia in our office, we briefly discussed management of symptoms with adequate rest, stress reduction, and consistent physical activity. I would encouraged patient to speak with her primary care provider regarding pain management. Patient would like to re-group with formal physical for her lower back, I will place order today. No red flag symptoms noted upon exam  today.     Meds & Orders: No orders of the defined types were placed in this encounter.   Orders Placed This Encounter  Procedures   Ambulatory referral to Physical Therapy    Follow-up: Return if symptoms worsen or fail  to improve.   Procedures: No procedures performed      Clinical History: MR LUMBAR SPINE WITHOUT IV CONTRAST   COMPARISON: None available   CLINICAL HISTORY: Low back pain.   TECHNIQUE: SAG T2, SAG T1, SAG STIR, AX T2, AX T1 without IV contrast.   FINDINGS: There is normal alignment of the lumbar spine. Multilevel disc desiccation and facet arthrosis is present. Moderate facet edema is seen at the L3-4 level. There is no vertebral body height loss, subluxation or marrow replacing process. The sacrum and SI joints are unremarkable so far as visualized. Conus and cauda equina are unremarkable.   T12-L1: There is no focal disc protrusion, foraminal or spinal stenosis. Mild facet arthrosis.   L1-2: There is no focal disc protrusion, foraminal or spinal stenosis. Mild-to-moderate facet arthrosis.   L2-3: There is no focal disc protrusion, foraminal or spinal stenosis. Moderate facet arthrosis.   L3-4: Mild disc desiccation, moderate facet arthrosis and ligament flavum hypertrophy. Bilateral facet effusions are identified. There is mild to moderate bilateral foramina narrowing. No impingement of the exiting L3 nerve root is mild effacement of the ventral thecal sac.   L4-5: There is a broad-based disc osteophyte with moderate facet arthrosis. There is slight crowding of the descending nerve roots in the lateral recess. No significant impingement. Mild caudal prominent bilaterally, right and left. Correlation for mild right L4 radiculopathy.   L5-S1: There is a broad-based disc osteophyte with moderate facet arthrosis bilaterally. There is slight caudal foraminal narrowing bilaterally without impingement the exiting nerves.   The retroperitoneal structures demonstrate no significant abnormality.   IMPRESSION: Multilevel degenerative disc desiccation with moderate facet arthrosis. Multilevel disc desiccation and moderate facet arthrosis is present most notably at L3-4 and  L4-5. See above for more detailed each individual level. No high-grade spinal stenosis is appreciated.   Electronically signed by: Norleen Satchel MD 06/30/2023 12:40 PM EDT RP Workstation: [id number]   She reports that she has never smoked. She has never been exposed to tobacco smoke. She has never used smokeless tobacco.  Recent Labs    01/05/23 1541 04/10/23 0833 05/15/23 1356 07/17/23 0912  HGBA1C 6.3 8.6*  --  7.2*  LABURIC  --   --  3.2  --     Objective:  VS:  HT:    WT:   BMI:     BP:   HR: bpm  TEMP: ( )  RESP:  Physical Exam Vitals and nursing note reviewed.  HENT:     Head: Normocephalic and atraumatic.     Right Ear: External ear normal.     Left Ear: External ear normal.     Nose: Nose normal.     Mouth/Throat:     Mouth: Mucous membranes are moist.  Eyes:     Extraocular Movements: Extraocular movements intact.  Cardiovascular:     Rate and Rhythm: Normal rate.     Pulses: Normal pulses.  Pulmonary:     Effort: Pulmonary effort is normal.  Abdominal:     General: Abdomen is flat. There is no distension.  Musculoskeletal:        General: Tenderness present.     Cervical back: Normal range of motion.     Comments: Patient  rises from seated position to standing without difficulty. Good lumbar range of motion. No pain noted with facet loading. 5/5 strength noted with bilateral hip flexion, knee flexion/extension, ankle dorsiflexion/plantarflexion and EHL. No clonus noted bilaterally. No pain upon palpation of greater trochanters. No pain with internal/external rotation of bilateral hips. Sensation intact bilaterally. Myofascial tenderness noted to thoracic and lumbar paraspinal regions upon palpation. Negative slump test bilaterally. Ambulates without aid, gait steady.     Skin:    General: Skin is warm.     Capillary Refill: Capillary refill takes less than 2 seconds.  Neurological:     General: No focal deficit present.     Mental Status: She is alert and  oriented to person, place, and time.  Psychiatric:        Mood and Affect: Mood normal.        Behavior: Behavior normal.     Ortho Exam  Imaging: No results found.  Past Medical/Family/Surgical/Social History: Medications & Allergies reviewed per EMR, new medications updated. Patient Active Problem List   Diagnosis Date Noted   Routine adult health maintenance 09/21/2023   Partial nontraumatic tear of right rotator cuff 08/15/2023   Impingement syndrome of right shoulder 08/15/2023   Excessive daytime sleepiness 12/27/2022   Sleep attack 12/27/2022   Nightmare disorder 12/27/2022   Snoring 12/27/2022   Chronic right shoulder pain 11/16/2022   ADHD 01/03/2022   Urine frequency 08/08/2018   OSA on CPAP 02/09/2016   Diabetes mellitus (HCC) 07/11/2012   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 W/O OBSTRUCTION 05/03/2006   Past Medical History:  Diagnosis Date   Allergy    Anemia    Arthritis    Bipolar 1 disorder [facility])    Sees Dr. Kermit, psychology,  [phone]   Carpal tunnel syndrome    Depression    Diabetes mellitus without complication [facility])    Family history of adverse reaction to anesthesia    mother & son have difficulty waking   Fatigue    Ganglion of left wrist 07/26/2018   Lung nodule seen on imaging study 09/24/2013   Peripheral vascular disease (HCC)    Sleep apnea    uses cpap   Family History  Problem Relation Age of Onset   Hypertension Mother    Diabetes Mellitus II Mother    Congestive Heart Failure Father    Hypertension Father    Diabetes Mellitus II Father    Sleep apnea Father    Sleep apnea Son    Colon cancer Neg Hx    Esophageal cancer Neg Hx    Rectal cancer Neg Hx    Stomach  cancer Neg Hx    Colon polyps Neg Hx    Past Surgical History:  Procedure Laterality Date   ABDOMINAL HYSTERECTOMY     CHOLECYSTECTOMY N/A 06/22/2017   Procedure: LAPAROSCOPIC CHOLECYSTECTOMY;  Surgeon: Stevie Herlene Righter, MD;  Location: [facility] OR;  Service: General;  Laterality: N/A;   I & D EXTREMITY Right 09/14/2015   Procedure: IRRIGATION AND DEBRIDEMENT RIGHT MIDDLE FINGER;  Surgeon: Elsie Mussel, MD;  Location: [facility] OR;  Service: Orthopedics;  Laterality: Right;   POSTERIOR LUMBAR FUSION 2 WITH HARDWARE REMOVAL Right 08/15/2023   Procedure: ARTHROSCOPY, SHOULDER WITH DEBRIDEMENT;  Surgeon:  Jerri Kay HERO, MD;  Location: [facility];  Service: Orthopedics;  Laterality: Right;   TUBAL LIGATION     Social History   Occupational History   Not on file  Tobacco Use   Smoking status: Never    Passive exposure: Never   Smokeless tobacco: Never  Vaping Use   Vaping status: Never Used  Substance and Sexual Activity   Alcohol use: Not Currently    Comment: occ   Drug use: No   Sexual activity: Not on file    Comment: Hysterectomy

## 2023-10-31 ENCOUNTER — Encounter: Payer: Self-pay | Admitting: Pharmacist

## 2023-10-31 ENCOUNTER — Ambulatory Visit: Admitting: Physical Therapy

## 2023-10-31 ENCOUNTER — Ambulatory Visit (INDEPENDENT_AMBULATORY_CARE_PROVIDER_SITE_OTHER): Admitting: Pharmacist

## 2023-10-31 ENCOUNTER — Encounter: Payer: Self-pay | Admitting: Physical Therapy

## 2023-10-31 VITALS — BP 124/88 | HR 83 | Wt 293.4 lb

## 2023-10-31 DIAGNOSIS — E11628 Type 2 diabetes mellitus with other skin complications: Secondary | ICD-10-CM

## 2023-10-31 DIAGNOSIS — E785 Hyperlipidemia, unspecified: Secondary | ICD-10-CM | POA: Diagnosis not present

## 2023-10-31 DIAGNOSIS — M6281 Muscle weakness (generalized): Secondary | ICD-10-CM

## 2023-10-31 DIAGNOSIS — M25511 Pain in right shoulder: Secondary | ICD-10-CM

## 2023-10-31 LAB — POCT GLYCOSYLATED HEMOGLOBIN (HGB A1C): HbA1c, POC (controlled diabetic range): 6.7 % (ref 0.0–7.0)

## 2023-10-31 NOTE — Progress Notes (Signed)
 S:     Chief Complaint  Patient presents with   Medication Management    Dm follow-up    48 y.o. female who presents for diabetes evaluation, education, and management. Patient arrives in good spirits and presents without any assistance. Patient is accompanied by daughter Harlene for the first portion of the visit today.   Patient was referred and last seen by Primary Care Provider, Dr. Jerrie, on 09/21/23.  Since last appointment with PCP patient contacted pharmacy on 10/23/23 to report frequent lows on Ozempic  (semaglutide ) and after discussing options was switched to Trulicity  .   PMH is significant for Diabetes, hypertension, Hyperlipidemia, Anxiety, ADHD.  Patient reports Diabetes was diagnosed in 2014.   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   Do you feel that your medications are working for you? yes Have you been experiencing any side effects to the medications prescribed? no Do you have any problems obtaining medications due to transportation or finances? no Insurance coverage: [place] medicaid  Patient reports hypoglycemic events. Getting jittery and sweaty At 200 mg/dL she reports excessive sleepiness Candy causes highs for her when trying to rescue lows Tolerating trulicity  extremely well  Patient denies nocturia (nighttime urination).  Patient denies neuropathy (nerve pain) but reports idiopathic pain. Patient denies visual changes. Patient denies self foot exams.   Patient reported dietary habits: Eats 1 meal/day Random meals a day, sometimes she eats and sometimes she doesn't. Massive change in schedule has led to  less. Beans, mediterranean salad, pasta, tofu. Water , Glucerna shakes and powder with protein. Occasional soda. Very picky eater.  Vegetarian for 4 years. Grades herself a C on diet.  Shoulder PT has been going great after surgery Recently being evaluated for fibromyalgia diagnosis and treatment (x-rays, MRI done)  O:   Review of Systems  All other systems reviewed and are negative.   Physical Exam Constitutional:      Appearance: Normal appearance.  Pulmonary:     Effort: Pulmonary effort is normal.  Neurological:     Mental Status: She is alert.  Psychiatric:        Mood and Affect: Mood normal.        Behavior: Behavior normal.        Thought Content: Thought content normal.        Judgment: Judgment normal.     Libre3 CGM Download today 8/3-8/16 (active days 13-16) % Time CGM is active: 28% Average Glucose: 128 mg/dL Glucose Management Indicator: Not able to calculate with 4 days of sensor use  Glucose Variability: 25.5% (goal <36%) Time in Goal:  - Time in range 70-180: 91% - Time above range: 9% (0% >250 mg/dL) - Time below range: 0% Observed patterns: Deleted and re-downloaded app so history now graph data is stored anymore after getting reconnected  Lab Results  Component Value Date   HGBA1C 6.7 10/31/2023   Vitals:   10/31/23 0908  BP: 124/88  Pulse: 83  SpO2: 99%    Lipid Panel     Component Value Date/Time   CHOL 220 (H) 10/16/2022 1653   TRIG 207 (H) 10/16/2022 1653   HDL 67 10/16/2022 1653   CHOLHDL 3.3 10/16/2022 1653   CHOLHDL  3.9 11/09/2015 1617   VLDL 62 (H) 11/09/2015 1617   LDLCALC 117 (H) 10/16/2022 1653   LDLDIRECT 130 (H) 08/30/2009 2217    Clinical Atherosclerotic Cardiovascular Disease (ASCVD): No  The 10-year ASCVD risk score (Arnett DK, et al., 2019) is: 2.9%   Values used to calculate the score:     Age: 37 years     Clincally relevant sex: Female     Is Non-Hispanic African American: Yes     Diabetic: Yes     Tobacco  smoker: No     Systolic Blood Pressure: 124 mmHg     Is BP treated: No     HDL Cholesterol: 67 mg/dL     Total Cholesterol: 220 mg/dL   Patient is participating in a Managed Medicaid Plan:  Yes   A/P: Diabetes longstanding currently improving with an A1c of 6.7 today. Patient is able to verbalize appropriate hypoglycemia management plan. Medication adherence appears good. -Continued Trulicity  (dulaglutide ) 0.75 mg weekly - started this week (replacing Ozempic  (semaglutide ) ) -Continued Metformin  XR 500 mg 2 tablets twice daily with meals  -Patient educated on purpose, proper use, and potential adverse effects of Nausea/GI issues.  -Extensively discussed pathophysiology of diabetes, recommended lifestyle interventions, dietary effects on blood sugar control.  -Counseled on s/sx of and management of hypoglycemia.   ASCVD risk - primary prevention in patient with diabetes. Last LDL is 117 (10/16/22) not at goal of <70 mg/dL. ASCVD risk factors include diabetes and 10-year ASCVD risk score of 2.9. moderate intensity statin indicated.  -Order Lipid panel at follow-up visit to gauge increasing dose on rosuvastatin  10 mg after seeing tolerability of recent med changes.   Written patient instructions provided. Patient verbalized understanding of treatment plan.  Total time in face to face counseling 29 minutes.    Follow-up:  Pharmacist in October  Patient seen with Fonda Blase, PharmD Candidate - PY3 student and Calton Nash, PharmD Candidate - PY4 student.

## 2023-10-31 NOTE — Patient Instructions (Signed)
 It was nice to see you today! Happy to see all the good work you've done!  Your goal blood sugar is 80-130 before eating and less than 180 after eating.  Medication Changes: Continue all other medication the same.  Monitor blood sugars at home and keep a log (glucometer or piece of paper) to bring with you to your next visit.  Keep up the good work with diet and exercise. Aim for a diet full of vegetables, fruit and tofu. Try to limit salt intake by eating fresh or frozen vegetables (instead of canned), rinse canned vegetables prior to cooking and do not add any additional salt to meals.

## 2023-10-31 NOTE — Assessment & Plan Note (Addendum)
 Diabetes longstanding currently improving with an A1c of 6.7 today. Patient is able to verbalize appropriate hypoglycemia management plan. Medication adherence appears good. -Continued Trulicity  (dulaglutide ) 0.75 mg weekly - started this week (replacing Ozempic  (semaglutide ) ) -Continued Metformin  XR 500 mg 2 tablets twice daily with meals  -Patient educated on purpose, proper use, and potential adverse effects of Nausea/GI issues.  -Extensively discussed pathophysiology of diabetes, recommended lifestyle interventions, dietary effects on blood sugar control.  -Counseled on s/sx of and management of hypoglycemia.

## 2023-10-31 NOTE — Therapy (Signed)
 OUTPATIENT PHYSICAL THERAPY SHOULDER TREATMENT   Patient Name: [name] [name] MRN: [id number] DOB:12-06-1975, 48 y.o., female Today's Date: 10/31/2023  END OF SESSION:  PT End of Session - 10/31/23 0715     Visit Number 12    Number of Visits 17    Date for PT Re-Evaluation 11/21/23    Authorization Type [place] [employer]    Authorization Time Period 27 visit limit for calendar year ( used 9 in earlier episode)    Authorization - Visit Number 21    Authorization - Number of Visits 27    PT Start Time 0715    PT Stop Time 0755    PT Time Calculation (min) 40 min              Past Medical History:  Diagnosis Date   Allergy    Anemia    Arthritis    Bipolar 1 disorder (HCC)    Sees Dr. Kermit, psychology,  [phone]   Carpal tunnel syndrome    Depression    Diabetes mellitus without complication (HCC)    Family history of adverse reaction to anesthesia    mother & son have difficulty waking   Fatigue    Ganglion of left wrist 07/26/2018   Lung nodule seen on imaging study 09/24/2013   Peripheral vascular disease (HCC)    Sleep apnea    uses cpap   Past Surgical History:  Procedure Laterality Date   ABDOMINAL HYSTERECTOMY     CHOLECYSTECTOMY N/A 06/22/2017   Procedure: LAPAROSCOPIC CHOLECYSTECTOMY;  Surgeon: Stevie Herlene Righter, MD;  Location: [facility];  Service: General;  Laterality: N/A;   I & D EXTREMITY Right 09/14/2015   Procedure: IRRIGATION AND DEBRIDEMENT RIGHT MIDDLE FINGER;  Surgeon: Elsie Mussel, MD;  Location: [facility] OR;  Service: Orthopedics;  Laterality: Right;   POSTERIOR LUMBAR FUSION 2 WITH HARDWARE REMOVAL Right 08/15/2023   Procedure: ARTHROSCOPY, SHOULDER WITH DEBRIDEMENT;  Surgeon: Jerri Kay HERO, MD;  Location: [facility];  Service: Orthopedics;  Laterality: Right;   TUBAL LIGATION     Patient Active Problem List   Diagnosis Date Noted   Routine adult health maintenance 09/21/2023   Partial nontraumatic tear of right rotator cuff  08/15/2023   Impingement syndrome of right shoulder 08/15/2023   Excessive daytime sleepiness 12/27/2022   Sleep attack 12/27/2022   Nightmare disorder 12/27/2022   Snoring 12/27/2022   Chronic right shoulder pain 11/16/2022   ADHD 01/03/2022   Urine frequency 08/08/2018   OSA on CPAP 02/09/2016   Diabetes mellitus (HCC) 07/11/2012   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 W/O OBSTRUCTION 05/03/2006    PCP: Marlee Lynwood NOVAK, MD  REFERRING PROVIDER: Jule Ronal CROME, PA-C  REFERRING DIAG: [id number] (ICD-10-CM) - History of arthroscopy of right shoulder   THERAPY DIAG:  Acute pain of right shoulder  Muscle weakness (generalized)  Rationale for Evaluation and Treatment: Rehabilitation  ONSET DATE: 08/15/2023  SUBJECTIVE:  SUBJECTIVE STATEMENT: Pt presents to PT noting the shoulder is feeling good. Has been compliant with HEP.   Hand dominance: Right  PERTINENT HISTORY: DM II, bipolar, depression  PAIN:  Are you having pain?  Yes: NPRS scale: 0/10 Worst: 10/10 Pain location: R anterior shoulder Pain description: sharp, sore Aggravating factors: OH reaching, lifting Relieving factors: rest  PRECAUTIONS: avoid resisted elbow flexion  RED FLAGS: None   WEIGHT BEARING RESTRICTIONS: No  FALLS:  Has patient fallen in last 6 months? No  LIVING ENVIRONMENT: Lives with: lives with their family Lives in: House/apartment Stairs: No Has following equipment at home: None  OCCUPATION: Not working  PLOF: Independent  PATIENT GOALS: get back    NEXT MD VISIT: 09/28/2023- rescheduled 10/05/23  OBJECTIVE:  Note: Objective measures were completed at Evaluation unless otherwise noted.  DIAGNOSTIC FINDINGS:  See imaging   PATIENT SURVEYS:  Quick DASH: 80% disability  COGNITION: Overall cognitive status: Within functional limits for tasks assessed     SENSATION: Light touch: Impaired - anterior R UE  POSTURE: Rounded shoulder, large body habitus  UPPER EXTREMITY ROM:   Active ROM Right eval Left eval Right 09/28/23 Right  10/17/23 Right 10/31/23  Shoulder flexion 60 WFL 135 145   Shoulder extension       Shoulder abduction 83 WFL 120 132   Shoulder adduction       Shoulder internal rotation  WFL Reach to right low back  Reach to right low back  Reach to L3  Shoulder external rotation 25 WFL Reach to T2     Elbow flexion       Elbow extension       Wrist flexion       Wrist extension       Wrist ulnar deviation       Wrist radial deviation       Wrist pronation       Wrist supination       (Blank rows = not tested)  UPPER EXTREMITY MMT:  MMT Right eval Left eval Right 10/17/23  Shoulder flexion DNT 4 3+  Shoulder extension     Shoulder abduction DNT 4 3+  Shoulder adduction     Shoulder internal rotation DNT 4 4  Shoulder external rotation DNT  4  Middle trapezius     Lower trapezius     Elbow flexion     Elbow extension     Wrist flexion     Wrist extension     Wrist ulnar deviation     Wrist radial deviation     Wrist pronation     Wrist supination     Grip strength (lbs)     (Blank rows = not tested)  SHOULDER SPECIAL TESTS: DNT  JOINT MOBILITY TESTING:  GH hypomobility  PALPATION:  TTP to R anterior deltoid, R infraspinatus    TREATMENT: [facility] Adult PT Treatment:                                                DATE: 10/31/23 Therapeutic Exercise/Activity: Nustep 5 minutes UE/LE Level 5  FM row 20#  10 x 3  FM ext 20# 10 x 3  Green band 10 x 3 ER  Green band 10 x 3 IR  Standing  lateral raise 1# 3  x 10  Standing forward raise  1# 3  x 10  OH press 2# x 7  IR flossing with dowel x 10  IR AAROM with dowel x 10   Consider cabinet reaching , wall washing, ball on wall circles  Discuss visit limit   [facility] Adult PT Treatment:                                                DATE: 10/24/23 Therapeutic Exercise/Activity: UBE level 2  2 minutes each direction  FM row 20#  10 x 3  FM ext 20# 10 x 3  Red band 10 x 3 ER  Red band 10 x 3 IR  Standing lateral raise 1# 2  x 10  Standing forward raise  1# 2 x 10  IR flossing with dowel x 10  IR AAROM with dowel x 10  Supine dowel pullover x 10  Passive shoulder flexion, abduction, IR , ER      [facility] Adult PT Treatment:                                                DATE: 10/19/23 Therapeutic Activity: UBE level 1.5 x 2 min each direction Inclined shoulder flexion 3x10 2# ea FM row 3x10 20# FM ext 2x10 20# Shoulder ER 2x10 YTB Shoulder abd 2x10 1# - standing palm down Standing IR floss with dow 2x10 Seated low row 2x10 30# Seated lat pulldown 2x10 30#  [facility] Adult PT Treatment:                                                DATE: 10/17/23 Therapeutic Activity: UBE level 1 2 min each direction Standing wall slide shoulder flexion for PROM x 30 sec Standing wall slide shoulder abduction for PROM x 30 sec Shoulder extension and IR with dowel AAROM x 10 each  Sleeper stretch standing at wall  Standing forward raise 2 x 10  Standing lateral raise 2 x 10  Updated HEP    PATIENT EDUCATION: Education details: eval findings, Quick DASH, HEP, POC Person educated: Patient Education method: Explanation, Demonstration, and Handouts Education comprehension: verbalized understanding and returned demonstration  HOME EXERCISE PROGRAM: Access Code: [email or web address] URL: [email or web address] Date: 09/13/2023 Prepared by: Alm Kingdom  Exercises - Seated Scapular Retraction  - 1-2 x daily - 7 x weekly - 2 sets -  10 reps - 3-5 sec hold - Seated Shoulder Flexion Towel Slide at Table Top  - 1-2 x daily - 7 x weekly - 3 sets - 10 reps - 5 sec hold - Seated Shoulder Abduction Towel Slide at Table Top  - 1-2 x daily - 7 x weekly - 3 sets - 10 reps - 5 sec hold - Supine Shoulder Flexion AAROM with Hands Clasped  - 1-2 x daily - 7 x weekly - 3 sets - 10 reps - 5 sec hold - Standing Isometric Shoulder Internal Rotation at Doorway  - 1 x daily - 7 x weekly - 1 sets - 10 reps - 3-5 hold - Standing Isometric Shoulder External Rotation with Doorway  - 1 x daily - 7 x weekly -  3 sets - 10 reps - 3-5 hold - Supine Shoulder Flexion Extension Full Range AROM  - 1 x daily - 7 x weekly - 2 sets - 10 reps - Sidelying Shoulder Abduction Palm Forward  - 1 x daily - 7 x weekly - 2 sets - 10 reps Added 10/17/23  Standing Shoulder Extension ROM with Dowel  - 1 x daily - 7 x weekly - 1-2 sets - 10 reps - 5 hold - Standing Shoulder Internal Rotation AAROM Behind Back with Towel   - 1 x daily - 7 x weekly - 1-2 sets - 10 reps - 5 hold - Standing Shoulder Flexion to 90 Degrees with Dumbbells  - 1 x daily - 7 x weekly - 1-3 sets - 10 reps - Standing Single Arm Shoulder Abduction with Dumbbell - Palm Down  - 1 x daily - 7 x weekly - 1-3 sets - 10 reps  ASSESSMENT:  CLINICAL IMPRESSION: Pt was able to complete prescribed exercises with no adverse effect. Exercises focused on improving R shoulder strength. Able to tolerated increased reps in standing today. IR AROM reach is improved.  Continues to benefit from skilled PT services, will progress as tolerated per POC.   EVAL: Patient is a 48 y.o. F who was seen today for physical therapy evaluation and treatment s/p R shoulder arthroscopic debridement, SAD, and biceps tenotomy performed by Dr. Jerri on 08/15/2023. Physical findings are consistent with surgery and recovery time post op as she demonstrates decrease R shoulder ROM and strength. Quick DASH shows severe disability in performance of  home ADLs and community activities. Pt would benefit form skilled PT services post op working on improving proximal strength and shoulder function.   OBJECTIVE IMPAIRMENTS: decreased activity tolerance, decreased endurance, decreased mobility, decreased ROM, decreased strength, postural dysfunction, and pain   ACTIVITY LIMITATIONS: carrying, lifting, reach over head, and hygiene/grooming  PARTICIPATION LIMITATIONS: meal prep, cleaning, driving, shopping, community activity, and yard work  PERSONAL FACTORS: Time since onset of injury/illness/exacerbation and 3+ comorbidities: DM II, bipolar, depression are also affecting patient's functional outcome.   REHAB POTENTIAL: Good  CLINICAL DECISION MAKING: Evolving/moderate complexity  EVALUATION COMPLEXITY: Moderate   GOALS: Goals reviewed with patient? No  SHORT TERM GOALS: Target date: 09/19/2023   Pt will be compliant and knowledgeable with initial HEP for improved comfort and carryover Baseline: initial HEP given  Goal status: MET  2.  Pt will self report right shoulder pain no greater than 6/10 for improved comfort and functional ability Baseline: 10/10 at worst 7/25/2: 8-9/10 this morning  10/17/23: intermittent twinges Goal status: MET   LONG TERM GOALS: Target date: 11/21/2023   Pt will decrease Quick DASH disability score to no greater than 50% as proxy for functional improvement Baseline: 80% disability  Goal status: INITIAL  2.  Pt will self report right shoulder pain no greater than 3/10 for improved comfort and functional ability Baseline: 10/10 at worst Goal status: INITIAL    3.  Pt will improve R shoulder flex to at least 140 degrees for improved functional with home and community activities  Baseline: see ROM chart Goal status: INITIAL  4.  Pt will improve R shoulder MMT to at least 4/5 for improved functional ability and shoulder stability with OH motion Baseline: DNT Goal status: INITIAL   PLAN:  PT  FREQUENCY: 2x/week  PT DURATION: 8 weeks  PLANNED INTERVENTIONS: 97164- PT Re-evaluation, 97110-Therapeutic exercises, 97530- Therapeutic activity, 97112- Neuromuscular re-education, 97535- Self Care, 02859- Manual therapy,  H9716- Electrical stimulation (unattended), [id number]- Electrical stimulation (manual), 02983- Vasopneumatic device, [id number] (1-2 muscles), 20561 (3+ muscles)- Dry Needling, Cryotherapy, and Moist heat  PLAN FOR NEXT SESSION: assess HEP response, progress flexion and strength,  endurance activities   Harlene Persons, PTA 10/31/23 10:11 AM Phone: [phone] Fax: [phone]

## 2023-10-31 NOTE — Assessment & Plan Note (Addendum)
 ASCVD risk - primary prevention in patient with diabetes. Last LDL is 117 (10/16/22) not at goal of <70 mg/dL. ASCVD risk factors include diabetes and 10-year ASCVD risk score of 2.9. moderate intensity statin indicated.  -Order Lipid panel at follow-up visit to gauge increasing dose on rosuvastatin  10 mg after seeing tolerability of recent med changes.

## 2023-11-02 ENCOUNTER — Encounter

## 2023-11-02 ENCOUNTER — Other Ambulatory Visit: Payer: Self-pay

## 2023-11-06 NOTE — Progress Notes (Unsigned)
    SUBJECTIVE:   CHIEF COMPLAINT / HPI:   ***   - Per Ortho 8/26 note, they think that fibromyalgia could be contributing to her pain symptoms and recommend following up with PCP. - Titrate venlafaxine ?     PERTINENT  PMH / PSH: ***  OBJECTIVE:   There were no vitals taken for this visit.  ***  ASSESSMENT/PLAN:   Assessment & Plan      Twyla Nearing, MD [facility]

## 2023-11-07 ENCOUNTER — Ambulatory Visit: Attending: Physician Assistant | Admitting: Physical Therapy

## 2023-11-07 ENCOUNTER — Encounter: Payer: Self-pay | Admitting: Family Medicine

## 2023-11-07 ENCOUNTER — Ambulatory Visit (INDEPENDENT_AMBULATORY_CARE_PROVIDER_SITE_OTHER): Admitting: Family Medicine

## 2023-11-07 ENCOUNTER — Other Ambulatory Visit (HOSPITAL_COMMUNITY): Payer: Self-pay

## 2023-11-07 ENCOUNTER — Encounter: Payer: Self-pay | Admitting: Physical Therapy

## 2023-11-07 VITALS — BP 141/106 | HR 83 | Ht 68.0 in | Wt 294.6 lb

## 2023-11-07 DIAGNOSIS — R293 Abnormal posture: Secondary | ICD-10-CM | POA: Diagnosis present

## 2023-11-07 DIAGNOSIS — M6281 Muscle weakness (generalized): Secondary | ICD-10-CM | POA: Diagnosis present

## 2023-11-07 DIAGNOSIS — G894 Chronic pain syndrome: Secondary | ICD-10-CM

## 2023-11-07 DIAGNOSIS — M25511 Pain in right shoulder: Secondary | ICD-10-CM | POA: Diagnosis present

## 2023-11-07 DIAGNOSIS — M5459 Other low back pain: Secondary | ICD-10-CM | POA: Diagnosis present

## 2023-11-07 MED ORDER — PREGABALIN 25 MG PO CAPS
25.0000 mg | ORAL_CAPSULE | Freq: Two times a day (BID) | ORAL | 2 refills | Status: DC
  Filled 2023-11-07: qty 60, 30d supply, fill #0

## 2023-11-07 NOTE — Patient Instructions (Addendum)
 Good to see you today - Thank you for coming in  Things we discussed today:  1) Fibromyalgia is a pain disorder caused by dysregulation of your pain signals. By incorporating therapy and exercise together, they can improve your pain. - I recommend regular aerobic exercise (walking, swimming, biking) for 30 minutes 5 times a week. Start slow and work yourself up to 5 days. Reach out to your local [employer] to see if they have programs for you to join.  - Reach out to any of the therapy providers below. Let them know that you are interested in cognitive behavioral therapy for fibromyalgia and chronic pain.  2) Take pregablin 25mg  1 hour before bedtime daily. If you are tolerating it well, you can increase to 50mg  at bedtime.  - Come back in 2 weeks   Therapy and Counseling Resources Most providers on this list will take Medicaid. Patients with commercial insurance or Medicare should contact their insurance company to get a list of in network providers.  [facility] (takes children) Location 1: [address] [place], [place] [address] Location 2: [address] [place], [place] [address] [phone]   [facility] (spanish speaking therapist available)(habla espanol)(take medicare and medicaid)  [address], [place], [place] [address], [place]  [email or web address] [phone]  [facility] [address] [place], [place] [address] [phone]  [facility]   [address] [place], [place] [address]      [phone]  [facility] (spanish available) [address]  [place], [place] [address] [phone]  [facility] (take [facility] and medicare) [address]  [place], [place] [address]       [phone]     [facility] (virtual only) [phone]  [facility] [address] [place], [place] [phone]  [facility]:  [address] [place] [phone]  [facility]:  [address] [place] [phone]  [facility] (under & uninsured) [address]   [place] [phone]    [email or web address]     [facility] [phone]  [place]    [phone]  [facility] -[address]     Phone:  [phone]     [facility]-  [address]  [phone]   [facility] [address]      [place], [place] [phone]  [facility]: [address] [place], [place], [place]  [phone]   [facility] (Spanish therapist) [email or web address]  [address] [place]  [address]   [place] [address]    [phone]    [facility]   [address],  [place], [place]  [phone]   [facility]  [address] [place]  [phone]  [facility]  [address] [place], [place]        [phone]  Open Access/Walk In Clinic under & uninsured  [facility]  [address] [place], [place] [phone] Crisis [phone]  [facility] [address],  (Spanish)   [address] , [place]: [phone]) 8:30 - 12; 1 - 2:30  [facility] [employer],  [address] [place], [place]    [phone]):8:30 - 12; 2 - 3PM  [facility] [employer],  [address],  [place]; [phone]):   Mon - Fri 8 AM - 5 PM  [facility] [address] [place]  MWF 12:30 to 3:00 or call to schedule an appointment  [phone]  Specific Provider options Psychology Today  [email or web address]  click on find a therapist  enter your zip code left side and select or tailor a therapist for your specific need.   [facility] Provider  Directory [email or web address]  (Medicaid)   Follow all drop down to find a provider  Social Support program [facility] [place] [phone] or [employer]pl [address], [place], [place] Recovery support and educational   24- Hour Availability:   [facility]  [address] [place], [place] [phone] Crisis [phone]  [facility] [phone]  [facility]  [phone]   [facility]  [phone] (after hours)  Therapeutic Alternative/Mobile Crisis   [phone]  [place]  National Suicide Hotline  [phone] MERRILYN)  Call 911 or go to emergency room  [facility]  [phone]);  [place] and [employer]  [phone]); [place], [place], [place], [place], [place], [place], [place]

## 2023-11-07 NOTE — Therapy (Signed)
 OUTPATIENT PHYSICAL THERAPY SHOULDER TREATMENT   Patient Name: [name] [name] MRN: [id number] DOB:[birth year], 48 y.o., female Today's Date: 11/07/2023  END OF SESSION:  PT End of Session - 11/07/23 0724     Visit Number 13    Number of Visits 17    Date for PT Re-Evaluation 11/21/23    Authorization Type [place] [employer]    Authorization Time Period 27 visit limit for calendar year ( used 9 in earlier episode)    Authorization - Visit Number 22    Authorization - Number of Visits 27    PT Start Time 0719    PT Stop Time 0757    PT Time Calculation (min) 38 min              Past Medical History:  Diagnosis Date   Allergy    Anemia    Arthritis    Bipolar 1 disorder (HCC)    Sees Dr. Kermit, psychology,  [phone]   Carpal tunnel syndrome    Depression    Diabetes mellitus without complication (HCC)    Family history of adverse reaction to anesthesia    mother & son have difficulty waking   Fatigue    Ganglion of left wrist 07/26/2018   Lung nodule seen on imaging study 09/24/2013   Peripheral vascular disease (HCC)    Sleep apnea    uses cpap   Past Surgical History:  Procedure Laterality Date   ABDOMINAL HYSTERECTOMY     CHOLECYSTECTOMY N/A 06/22/2017   Procedure: LAPAROSCOPIC CHOLECYSTECTOMY;  Surgeon: Stevie Herlene Righter, MD;  Location: [facility] OR;  Service: General;  Laterality: N/A;   I & D EXTREMITY Right 09/14/2015   Procedure: IRRIGATION AND DEBRIDEMENT RIGHT MIDDLE FINGER;  Surgeon: Elsie Mussel, MD;  Location: [facility] OR;  Service: Orthopedics;  Laterality: Right;   POSTERIOR LUMBAR FUSION 2 WITH HARDWARE REMOVAL Right 08/15/2023   Procedure: ARTHROSCOPY, SHOULDER WITH DEBRIDEMENT;  Surgeon: Jerri Kay HERO, MD;  Location: [facility];  Service: Orthopedics;  Laterality: Right;   TUBAL LIGATION     Patient Active Problem List   Diagnosis Date Noted   Routine adult health maintenance 09/21/2023   Partial nontraumatic tear of right rotator cuff  08/15/2023   Impingement syndrome of right shoulder 08/15/2023   Excessive daytime sleepiness 12/27/2022   Sleep attack 12/27/2022   Nightmare disorder 12/27/2022   Snoring 12/27/2022   Chronic right shoulder pain 11/16/2022   ADHD 01/03/2022   Urine frequency 08/08/2018   OSA on CPAP 02/09/2016   Diabetes mellitus (HCC) 07/11/2012   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 W/O OBSTRUCTION 05/03/2006    PCP: Marlee Lynwood NOVAK, MD  REFERRING PROVIDER: Jule Ronal CROME, PA-C  REFERRING DIAG: [phone] (ICD-10-CM) - History of arthroscopy of right shoulder   THERAPY DIAG:  Acute pain of right shoulder  Muscle weakness (generalized)  Rationale for Evaluation and Treatment: Rehabilitation  ONSET DATE: 08/15/2023  SUBJECTIVE:  SUBJECTIVE STATEMENT: Pt presents to PT noting the shoulder has been painful and she has not completed many exercises due to the pain. Seeing MD today regarding new diagnosis of Fibromyalgia. Shoulder is better today.   Hand dominance: Right  PERTINENT HISTORY: DM II, bipolar, depression  PAIN:  Are you having pain?  Yes: NPRS scale: 0/10 Worst: 10/10 Pain location: R anterior shoulder Pain description: sharp, sore Aggravating factors: OH reaching, lifting Relieving factors: rest  PRECAUTIONS: avoid resisted elbow flexion  RED FLAGS: None   WEIGHT BEARING RESTRICTIONS: No  FALLS:  Has patient fallen in last 6 months? No  LIVING ENVIRONMENT: Lives with: lives with their family Lives in: House/apartment Stairs:  No Has following equipment at home: None  OCCUPATION: Not working  PLOF: Independent  PATIENT GOALS: get back   NEXT MD VISIT: 09/28/2023- rescheduled 10/05/23  OBJECTIVE:  Note: Objective measures were completed at Evaluation unless otherwise noted.  DIAGNOSTIC FINDINGS:  See imaging   PATIENT SURVEYS:  Quick DASH: 80% disability 45.5% 11/07/23  COGNITION: Overall cognitive status: Within functional limits for tasks assessed     SENSATION: Light touch: Impaired - anterior R UE  POSTURE: Rounded shoulder, large body habitus  UPPER EXTREMITY ROM:   Active ROM Right eval Left eval Right 09/28/23 Right  10/17/23 Right 10/31/23 Right 11/07/23  Shoulder flexion 60 WFL 135 145    Shoulder extension        Shoulder abduction 83 WFL 120 132    Shoulder adduction        Shoulder internal rotation  WFL Reach to right low back  Reach to right low back  Reach to L3 Reach to L3  Shoulder external rotation 25 WFL Reach to T2      Elbow flexion        Elbow extension        Wrist flexion        Wrist extension        Wrist ulnar deviation        Wrist radial deviation        Wrist pronation        Wrist supination        (Blank rows = not tested)  UPPER EXTREMITY MMT:  MMT Right eval Left eval Right 10/17/23  Shoulder flexion DNT 4 3+  Shoulder extension     Shoulder abduction DNT 4 3+  Shoulder adduction     Shoulder internal rotation DNT 4 4  Shoulder external rotation DNT  4  Middle trapezius     Lower trapezius     Elbow flexion     Elbow extension     Wrist flexion     Wrist extension     Wrist ulnar deviation     Wrist radial deviation     Wrist pronation     Wrist supination     Grip strength (lbs)     (Blank rows = not tested)  SHOULDER SPECIAL TESTS: DNT  JOINT MOBILITY TESTING:  GH hypomobility  PALPATION:  TTP to R anterior deltoid, R infraspinatus    TREATMENT: [facility] Adult PT Treatment:                                                DATE:  11/07/23 Therapeutic Exercise: UBE Level 2 2 min each way  Seated forward raise 2# 10  x 3  Seated abdct 2# 10 x 3  OH press 3# 8 x 2   Therapeutic Activity: IR AAROM using opp hand  Low back washing with towel for IR ROM Cane IR AAROM Cane Ext AAROM    [facility] Adult PT Treatment:                                                DATE: 10/31/23 Therapeutic Exercise/Activity: Nustep 5 minutes UE/LE Level 5  FM row 20#  10 x 3  FM ext 20# 10 x 3  Green band 10 x 3 ER  Green band 10 x 3 IR  Standing lateral raise 1# 3  x 10  Standing forward raise  1# 3 x 10  OH press 2# x 7  IR flossing with dowel x 10  IR AAROM with dowel x 10   Consider cabinet reaching , wall washing, ball on wall circles  Discuss visit limit   [facility] Adult PT Treatment:                                                DATE: 10/24/23 Therapeutic Exercise/Activity: UBE level 2  2 minutes each direction  FM row 20#  10 x 3  FM ext 20# 10 x 3  Red band 10 x 3 ER  Red band 10 x 3 IR  Standing lateral raise 1# 2  x 10  Standing forward raise  1# 2 x 10  IR flossing with dowel x 10  IR AAROM with dowel x 10  Supine dowel pullover x 10  Passive shoulder flexion, abduction, IR , ER      [facility] Adult PT Treatment:                                                DATE: 10/19/23 Therapeutic Activity: UBE level 1.5 x 2 min each direction Inclined shoulder flexion 3x10 2# ea FM row 3x10 20# FM ext 2x10 20# Shoulder ER 2x10 YTB Shoulder abd 2x10 1# - standing palm down Standing IR floss with dow 2x10 Seated low row 2x10 30# Seated lat pulldown 2x10 30#  [facility] Adult PT Treatment:                                                DATE: 10/17/23 Therapeutic Activity: UBE level 1 2 min each direction Standing wall slide shoulder flexion for PROM x 30 sec Standing wall slide shoulder abduction for PROM x 30 sec Shoulder extension and IR with dowel AAROM x 10 each  Sleeper stretch standing at wall  Standing forward raise 2 x  10  Standing lateral raise 2 x 10  Updated HEP    PATIENT EDUCATION: Education details: eval findings, Quick DASH, HEP, POC Person educated: Patient Education method: Explanation, Demonstration, and Handouts Education comprehension: verbalized understanding and returned demonstration  HOME EXERCISE PROGRAM: Access Code: [email or web address] URL: [email or web address] Date: 09/13/2023 Prepared by: Alm Kingdom  Exercises -  Seated Scapular Retraction  - 1-2 x daily - 7 x weekly - 2 sets - 10 reps - 3-5 sec hold - Seated Shoulder Flexion Towel Slide at Table Top  - 1-2 x daily - 7 x weekly - 3 sets - 10 reps - 5 sec hold - Seated Shoulder Abduction Towel Slide at Table Top  - 1-2 x daily - 7 x weekly - 3 sets - 10 reps - 5 sec hold - Supine Shoulder Flexion AAROM with Hands Clasped  - 1-2 x daily - 7 x weekly - 3 sets - 10 reps - 5 sec hold - Standing Isometric Shoulder Internal Rotation at Doorway  - 1 x daily - 7 x weekly - 1 sets - 10 reps - 3-5 hold - Standing Isometric Shoulder External Rotation with Doorway  - 1 x daily - 7 x weekly - 3 sets - 10 reps - 3-5 hold - Supine Shoulder Flexion Extension Full Range AROM  - 1 x daily - 7 x weekly - 2 sets - 10 reps - Sidelying Shoulder Abduction Palm Forward  - 1 x daily - 7 x weekly - 2 sets - 10 reps Added 10/17/23  Standing Shoulder Extension ROM with Dowel  - 1 x daily - 7 x weekly - 1-2 sets - 10 reps - 5 hold - Standing Shoulder Internal Rotation AAROM Behind Back with Towel   - 1 x daily - 7 x weekly - 1-2 sets - 10 reps - 5 hold - Standing Shoulder Flexion to 90 Degrees with Dumbbells  - 1 x daily - 7 x weekly - 1-3 sets - 10 reps - Standing Single Arm Shoulder Abduction with Dumbbell - Palm Down  - 1 x daily - 7 x weekly - 1-3 sets - 10 reps  ASSESSMENT:  CLINICAL IMPRESSION: Pt was able to complete prescribed exercises with no adverse effect. Exercises focused on improving R shoulder strength and functional IR reaching. .  Able to tolerate increased resistance today.  IR AROM is unchanged from last measure.  She has been unable to work on her exercises due to chronic pain flare.  She is most concerned about her IR reach. Has new referral for low back pain. Continues to benefit from skilled PT services, will progress as tolerated per POC.   EVAL: Patient is a 48 y.o. F who was seen today for physical therapy evaluation and treatment s/p R shoulder arthroscopic debridement, SAD, and biceps tenotomy performed by Dr. Jerri on 08/15/2023. Physical findings are consistent with surgery and recovery time post op as she demonstrates decrease R shoulder ROM and strength. Quick DASH shows severe disability in performance of home ADLs and community activities. Pt would benefit form skilled PT services post op working on improving proximal strength and shoulder function.   OBJECTIVE IMPAIRMENTS: decreased activity tolerance, decreased endurance, decreased mobility, decreased ROM, decreased strength, postural dysfunction, and pain   ACTIVITY LIMITATIONS: carrying, lifting, reach over head, and hygiene/grooming  PARTICIPATION LIMITATIONS: meal prep, cleaning, driving, shopping, community activity, and yard work  PERSONAL FACTORS: Time since onset of injury/illness/exacerbation and 3+ comorbidities: DM II, bipolar, depression are also affecting patient's functional outcome.   REHAB POTENTIAL: Good  CLINICAL DECISION MAKING: Evolving/moderate complexity  EVALUATION COMPLEXITY: Moderate   GOALS: Goals reviewed with patient? No  SHORT TERM GOALS: Target date: 09/19/2023   Pt will be compliant and knowledgeable with initial HEP for improved comfort and carryover Baseline: initial HEP given  Goal status: MET  2.  Pt  will self report right shoulder pain no greater than 6/10 for improved comfort and functional ability Baseline: 10/10 at worst 7/25/2: 8-9/10 this morning  10/17/23: intermittent twinges Goal status: MET   LONG TERM  GOALS: Target date: 11/21/2023   Pt will decrease Quick DASH disability score to no greater than 50% as proxy for functional improvement Baseline: 80% disability 11/07/23: 45.5% Goal status: MET  2.  Pt will self report right shoulder pain no greater than 3/10 for improved comfort and functional ability Baseline: 10/10 at worst Goal status: INITIAL    3.  Pt will improve R shoulder flex to at least 140 degrees for improved functional with home and community activities  Baseline: see ROM chart Goal status: MET  4.  Pt will improve R shoulder MMT to at least 4/5 for improved functional ability and shoulder stability with OH motion Baseline: DNT Goal status: INITIAL   PLAN:  PT FREQUENCY: 2x/week  PT DURATION: 8 weeks  PLANNED INTERVENTIONS: 97164- PT Re-evaluation, 97110-Therapeutic exercises, 97530- Therapeutic activity, 97112- Neuromuscular re-education, 97535- Self Care, 02859- Manual therapy, [id number]- Electrical stimulation (unattended), [id number]- Electrical stimulation (manual), 97016- Vasopneumatic device, 20560 (1-2 muscles), 20561 (3+ muscles)- Dry Needling, Cryotherapy, and Moist heat  PLAN FOR NEXT SESSION: assess HEP response, progress flexion and strength,  endurance activities   Harlene Persons, PTA 11/07/23 9:10 AM Phone: [phone] Fax: [phone]

## 2023-11-07 NOTE — Progress Notes (Unsigned)
    SUBJECTIVE:   CHIEF COMPLAINT / HPI:   ***   - Pt reports pain - This has been ongoing for years - Reports it intereferes with her sleep at times  - It hurts to walk for her   - Needs a therapist    - Exercise - Is currently doing physical therapy for her shoulders and her back.    - Her dad also had fibromyalgia  - Was seen by  PERTINENT  PMH / PSH: ***  OBJECTIVE:   BP (!) 141/106   Pulse 83   Ht 5' 8 (1.727 m)   Wt 294 lb 9.6 oz (133.6 kg)   SpO2 100%   BMI 44.79 kg/m   ***  ASSESSMENT/PLAN:   Assessment & Plan      Twyla Nearing, MD [facility]

## 2023-11-08 ENCOUNTER — Encounter: Payer: Self-pay | Admitting: Orthopaedic Surgery

## 2023-11-09 ENCOUNTER — Ambulatory Visit: Admitting: Orthopaedic Surgery

## 2023-11-09 ENCOUNTER — Encounter: Admitting: Physical Therapy

## 2023-11-09 DIAGNOSIS — G894 Chronic pain syndrome: Secondary | ICD-10-CM | POA: Insufficient documentation

## 2023-11-09 NOTE — Assessment & Plan Note (Signed)
 Given chronic nature of pain, widespread distribution of pain, and MRI workup not necessarily correlating with her symptoms, pt likely has a component of fibromyalgia that is contributing to her chronic pain. In addition to her current PT, pt would benefit from regular aerobic exercise and CBT. Given her long-term stability on venlafaxine  for bipolar, I will not adjust her psych regimen. Will add lyrica  for added pain control. - Start lyrica  50mg  nightly. Advised to start with 25mg  for 1 week, then uptitrate to 50mg  if tolerating well. - Provided therapy resources for CBT  - Encouraged aerobic exercise 5x weekly. Advised to reach out to local [employer] to see if they have programs/groups she can join. - Follow-up in 2 weeks. Consider going through the [employer] of Rheumatology [facility]) criteria for rheumatology with pt and document score. This can further support fibromyalgia vs other chronic pain syndromes.

## 2023-11-12 ENCOUNTER — Ambulatory Visit: Payer: Self-pay | Admitting: Physical Therapy

## 2023-11-13 ENCOUNTER — Ambulatory Visit: Payer: Self-pay | Admitting: Physical Therapy

## 2023-11-13 ENCOUNTER — Telehealth: Payer: Self-pay | Admitting: Family Medicine

## 2023-11-13 ENCOUNTER — Other Ambulatory Visit (HOSPITAL_COMMUNITY): Payer: Self-pay

## 2023-11-13 ENCOUNTER — Encounter: Payer: Self-pay | Admitting: Physical Therapy

## 2023-11-13 ENCOUNTER — Encounter: Payer: Self-pay | Admitting: Family Medicine

## 2023-11-13 DIAGNOSIS — M6281 Muscle weakness (generalized): Secondary | ICD-10-CM

## 2023-11-13 DIAGNOSIS — M25511 Pain in right shoulder: Secondary | ICD-10-CM

## 2023-11-13 DIAGNOSIS — R293 Abnormal posture: Secondary | ICD-10-CM

## 2023-11-13 DIAGNOSIS — M5459 Other low back pain: Secondary | ICD-10-CM

## 2023-11-13 NOTE — Therapy (Signed)
 OUTPATIENT PHYSICAL THERAPY SHOULDER TREATMENT   Patient Name: [name] [name] MRN: [id number] DOB:1975/11/19, 48 y.o., female Today's Date: 11/13/2023  END OF SESSION:  PT End of Session - 11/13/23 0714     Visit Number 14    Number of Visits 17    Date for PT Re-Evaluation 11/21/23    Authorization Type [employer]    Authorization Time Period 27 visit limit for calendar year ( used 9 in earlier episode)    Authorization - Number of Visits 27    PT Start Time 0715    PT Stop Time 0756    PT Time Calculation (min) 41 min              Past Medical History:  Diagnosis Date   Allergy    Anemia    Arthritis    Bipolar 1 disorder (HCC)    Sees Dr. Kermit, psychology,  [phone]   Carpal tunnel syndrome    Depression    Diabetes mellitus without complication (HCC)    Family history of adverse reaction to anesthesia    mother & son have difficulty waking   Fatigue    Ganglion of left wrist 07/26/2018   Lung nodule seen on imaging study 09/24/2013   Peripheral vascular disease (HCC)    Sleep apnea    uses cpap   Past Surgical History:  Procedure Laterality Date   ABDOMINAL HYSTERECTOMY     CHOLECYSTECTOMY N/A 06/22/2017   Procedure: LAPAROSCOPIC CHOLECYSTECTOMY;  Surgeon: Stevie Herlene Righter, MD;  Location: [facility] OR;  Service: General;  Laterality: N/A;   I & D EXTREMITY Right 09/14/2015   Procedure: IRRIGATION AND DEBRIDEMENT RIGHT MIDDLE FINGER;  Surgeon: Elsie Mussel, MD;  Location: [facility] OR;  Service: Orthopedics;  Laterality: Right;   POSTERIOR LUMBAR FUSION 2 WITH HARDWARE REMOVAL Right 08/15/2023   Procedure: ARTHROSCOPY, SHOULDER WITH DEBRIDEMENT;  Surgeon: Jerri Kay HERO, MD;  Location: [facility];  Service: Orthopedics;  Laterality: Right;   TUBAL LIGATION     Patient Active Problem List   Diagnosis Date Noted   Chronic pain disorder 11/09/2023   Routine adult health maintenance 09/21/2023   Partial nontraumatic tear of right rotator cuff  08/15/2023   Impingement syndrome of right shoulder 08/15/2023   Excessive daytime sleepiness 12/27/2022   Sleep attack 12/27/2022   Nightmare disorder 12/27/2022   Snoring 12/27/2022   Chronic right shoulder pain 11/16/2022   ADHD 01/03/2022   Urine frequency 08/08/2018   OSA on CPAP 02/09/2016   Diabetes mellitus (HCC) 07/11/2012   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 W/O OBSTRUCTION 05/03/2006    PCP: Marlee Lynwood NOVAK, MD  REFERRING PROVIDER: Jule Ronal CROME, PA-C  REFERRING DIAG: [phone] (ICD-10-CM) - History of arthroscopy of right shoulder   THERAPY DIAG:  Other low back pain - Plan: PT plan of care cert/re-cert  Abnormal posture - Plan: PT plan of care cert/re-cert  Muscle weakness (generalized) - Plan: PT plan of care cert/re-cert  Acute pain of right shoulder - Plan: PT plan of care cert/re-cert  Rationale for Evaluation and Treatment: Rehabilitation  ONSET DATE: 08/15/2023  SUBJECTIVE:  SUBJECTIVE STATEMENT: Pt recently diagnosed with fibromyalgia.  She reports that she has PT in the past for her low back with some success.  Hand dominance: Right  PERTINENT HISTORY: DM II, bipolar, depression  PAIN:  Are you having pain?  Yes: NPRS scale: 0/10 Worst: 10/10 Pain location: R anterior shoulder Pain description: sharp, sore Aggravating factors: OH reaching, lifting Relieving factors: rest  Pain:  Are you having pain? Yes Pain location: low back with radiating pain to bil LE NPRS scale:  highest 10/10 current 10/10  best  1/10 Aggravating factors: standing for longer periods (30 min) Relieving factors: sleeping   PRECAUTIONS: avoid resisted elbow flexion  RED FLAGS: None   WEIGHT BEARING RESTRICTIONS: No  FALLS:  Has patient fallen in last 6 months? No  LIVING ENVIRONMENT: Lives with: lives with their family Lives in: House/apartment Stairs: No Has following equipment at home: None  OCCUPATION: Not working  PLOF: Independent  PATIENT GOALS: get back   NEXT MD VISIT: 09/28/2023- rescheduled 10/05/23  OBJECTIVE:  Note: Objective measures were completed at Evaluation unless otherwise noted.  DIAGNOSTIC FINDINGS:  See imaging   PATIENT SURVEYS:  Quick DASH: 80% disability 45.5% 11/07/23  COGNITION: Overall cognitive status: Within functional limits for tasks assessed     SENSATION: Light touch: Impaired - anterior R UE  POSTURE: Rounded shoulder, large body habitus  UPPER EXTREMITY ROM:   Active ROM Right eval Left eval Right 09/28/23 Right  10/17/23 Right 10/31/23 Right 11/07/23  Shoulder flexion 60 WFL 135 145    Shoulder extension        Shoulder abduction 83 WFL 120 132    Shoulder adduction        Shoulder internal rotation  WFL Reach to right low back  Reach to right low back  Reach to L3 Reach to L3  Shoulder external rotation 25 WFL Reach to T2      Elbow flexion        Elbow extension        Wrist flexion        Wrist extension        Wrist ulnar deviation        Wrist radial deviation        Wrist pronation        Wrist supination        (Blank rows = not tested)  UPPER EXTREMITY MMT:  MMT Right eval Left eval Right 10/17/23  Shoulder flexion DNT 4 3+  Shoulder extension     Shoulder abduction DNT 4 3+  Shoulder adduction     Shoulder internal rotation DNT 4 4  Shoulder external rotation DNT  4  Middle trapezius     Lower trapezius     Elbow flexion     Elbow extension     Wrist flexion     Wrist extension     Wrist ulnar deviation     Wrist  radial deviation     Wrist pronation     Wrist supination     Grip strength (lbs)     (Blank rows = not tested)  SHOULDER SPECIAL TESTS: DNT  JOINT MOBILITY TESTING:  GH hypomobility  PALPATION:  TTP to R anterior deltoid, R infraspinatus    TREATMENT: [facility] Adult PT Treatment:  DATE: 11/13/23 Therapeutic Exercise: PPT - 2x10 - 3'' hold PPT with ball squeeze - 2x10 - 3'' hold PPT with clam - Black TB - 2x10 Bridge on ball - 4x5 LTR  Therapeutic Activity: Row - 20# Shoulder ext - 20#    [facility] Adult PT Treatment:                                                DATE: 10/31/23 Therapeutic Exercise/Activity: Nustep 5 minutes UE/LE Level 5  FM row 20#  10 x 3  FM ext 20# 10 x 3  Green band 10 x 3 ER  Green band 10 x 3 IR  Standing lateral raise 1# 3  x 10  Standing forward raise  1# 3 x 10  OH press 2# x 7  IR flossing with dowel x 10  IR AAROM with dowel x 10   Consider cabinet reaching , wall washing, ball on wall circles  Discuss visit limit   [facility] Adult PT Treatment:                                                DATE: 10/24/23 Therapeutic Exercise/Activity: UBE level 2  2 minutes each direction  FM row 20#  10 x 3  FM ext 20# 10 x 3  Red band 10 x 3 ER  Red band 10 x 3 IR  Standing lateral raise 1# 2  x 10  Standing forward raise  1# 2 x 10  IR flossing with dowel x 10  IR AAROM with dowel x 10  Supine dowel pullover x 10  Passive shoulder flexion, abduction, IR , ER      [facility] Adult PT Treatment:                                                DATE: 10/19/23 Therapeutic Activity: UBE level 1.5 x 2 min each direction Inclined shoulder flexion 3x10 2# ea FM row 3x10 20# FM ext 2x10 20# Shoulder ER 2x10 YTB Shoulder abd 2x10 1# - standing palm down Standing IR floss with dow 2x10 Seated low row 2x10 30# Seated lat pulldown 2x10 30#  [facility] Adult PT Treatment:                                                DATE:  10/17/23 Therapeutic Activity: UBE level 1 2 min each direction Standing wall slide shoulder flexion for PROM x 30 sec Standing wall slide shoulder abduction for PROM x 30 sec Shoulder extension and IR with dowel AAROM x 10 each  Sleeper stretch standing at wall  Standing forward raise 2 x 10  Standing lateral raise 2 x 10  Updated HEP    PATIENT EDUCATION: Education details: eval findings, Quick DASH, HEP, POC Person educated: Patient Education method: Explanation, Demonstration, and Handouts Education comprehension: verbalized understanding and returned demonstration  HOME EXERCISE PROGRAM: Access Code: [email or web address] URL: [email or web address] Date: 09/13/2023 Prepared by: Alm  Stroup  Exercises - Seated Scapular Retraction  - 1-2 x daily - 7 x weekly - 2 sets - 10 reps - 3-5 sec hold - Seated Shoulder Flexion Towel Slide at Table Top  - 1-2 x daily - 7 x weekly - 3 sets - 10 reps - 5 sec hold - Seated Shoulder Abduction Towel Slide at Table Top  - 1-2 x daily - 7 x weekly - 3 sets - 10 reps - 5 sec hold - Supine Shoulder Flexion AAROM with Hands Clasped  - 1-2 x daily - 7 x weekly - 3 sets - 10 reps - 5 sec hold - Standing Isometric Shoulder Internal Rotation at Doorway  - 1 x daily - 7 x weekly - 1 sets - 10 reps - 3-5 hold - Standing Isometric Shoulder External Rotation with Doorway  - 1 x daily - 7 x weekly - 3 sets - 10 reps - 3-5 hold - Supine Shoulder Flexion Extension Full Range AROM  - 1 x daily - 7 x weekly - 2 sets - 10 reps - Sidelying Shoulder Abduction Palm Forward  - 1 x daily - 7 x weekly - 2 sets - 10 reps Added 10/17/23  Standing Shoulder Extension ROM with Dowel  - 1 x daily - 7 x weekly - 1-2 sets - 10 reps - 5 hold - Standing Shoulder Internal Rotation AAROM Behind Back with Towel   - 1 x daily - 7 x weekly - 1-2 sets - 10 reps - 5 hold - Standing Shoulder Flexion to 90 Degrees with Dumbbells  - 1 x daily - 7 x weekly - 1-3 sets - 10 reps -  Standing Single Arm Shoulder Abduction with Dumbbell - Palm Down  - 1 x daily - 7 x weekly - 1-3 sets - 10 reps  LBP  Access Code: [email or web address] URL: [email or web address] Date: 11/13/2023 Prepared by: Helene Gasmen  Exercises - Supine Posterior Pelvic Tilt  - 2 x daily - 7 x weekly - 2 sets - 10 reps - 5'' hold - Supine Hip Adduction Isometric with Ball  - 1 x daily - 7 x weekly - 2 sets - 10 reps - 10'' hold - Hooklying Isometric Clamshell  - 1 x daily - 7 x weekly - 3 sets - 10 reps - Bridge  - 1 x daily - 7 x weekly - 3 sets - 10 reps - 5-10'' hold  ASSESSMENT:  CLINICAL IMPRESSION: Re-eval for chronic low back pain.  Pt recenly diagnosed with fibromyalgia which is likely a contributing factor to bil LE and back pain.  Pt shows significant disability per 30'' STS and ODI.  She will benefit from skilled therapy to improve function and reduce pain as well as establish robust HEP.  EVAL: Patient is a 48 y.o. F who was seen today for physical therapy evaluation and treatment s/p R shoulder arthroscopic debridement, SAD, and biceps tenotomy performed by Dr. Jerri on 08/15/2023. Physical findings are consistent with surgery and recovery time post op as she demonstrates decrease R shoulder ROM and strength. Quick DASH shows severe disability in performance of home ADLs and community activities. Pt would benefit form skilled PT services post op working on improving proximal strength and shoulder function.   OBJECTIVE IMPAIRMENTS: decreased activity tolerance, decreased endurance, decreased mobility, decreased ROM, decreased strength, postural dysfunction, and pain   ACTIVITY LIMITATIONS: carrying, lifting, reach over head, and hygiene/grooming  PARTICIPATION LIMITATIONS: meal prep, cleaning, driving, shopping, community activity, and yard work  PERSONAL FACTORS: Time since onset of injury/illness/exacerbation and 3+ comorbidities: DM II, bipolar, depression are also affecting  patient's functional outcome.   REHAB POTENTIAL: Good  CLINICAL DECISION MAKING: Evolving/moderate complexity  EVALUATION COMPLEXITY: Moderate   GOALS: Goals reviewed with patient? No  SHORT TERM GOALS: Target date: 09/19/2023   Pt will be compliant and knowledgeable with initial HEP for improved comfort and carryover Baseline: initial HEP given  Goal status: MET  2.  Pt will self report right shoulder pain no greater than 6/10 for improved comfort and functional ability Baseline: 10/10 at worst 7/25/2: 8-9/10 this morning  10/17/23: intermittent twinges Goal status: MET   LONG TERM GOALS: Target date: 11/21/2023   Pt will decrease Quick DASH disability score to no greater than 50% as proxy for functional improvement Baseline: 80% disability 11/07/23: 45.5% Goal status: MET  2.  Pt will self report right shoulder pain no greater than 3/10 for improved comfort and functional ability Baseline: 10/10 at worst Goal status: INITIAL    3.  Pt will improve R shoulder flex to at least 140 degrees for improved functional with home and community activities  Baseline: see ROM chart Goal status: MET  4.  Pt will improve R shoulder MMT to at least 4/5 for improved functional ability and shoulder stability with OH motion Baseline: DNT Goal status: INITIAL  5. [employer] will show a >/= 12 pt improvement in their ODI score (MCID is 12% or 6/50 pts) as a proxy for functional improvement   Evaluation/Baseline: 28/50 pts Goal status: NEW  6. Jailin will improve 30'' STS (MCID 2) to >/= 5x (w/ UE?: y) to show improved LE strength and improved transfers   Evaluation/Baseline: 3x  w/ UE? y Goal status: NEW    PLAN:  PT FREQUENCY: 2x/week  PT DURATION: 8 weeks  PLANNED INTERVENTIONS: 97164- PT Re-evaluation, 97110-Therapeutic exercises, 97530- Therapeutic activity, [id number]- Neuromuscular re-education, 97535- Self Care, 02859- Manual therapy, [id number]- Electrical stimulation (unattended),  [id number]- Electrical stimulation (manual), 97016- Vasopneumatic device, 20560 (1-2 muscles), 20561 (3+ muscles)- Dry Needling, Cryotherapy, and Moist heat  PLAN FOR NEXT SESSION: assess HEP response, progress flexion and strength,  endurance activities   Helene FORBES Gasmen PT 11/13/23 12:00 PM Phone: [phone] Fax: [phone]

## 2023-11-13 NOTE — Telephone Encounter (Signed)
Reviewed, completed, and signed form.  Note routed to RN team inbasket and placed completed form in RN Wall pocket in the front office.  Shadeed Colberg M Lorne Winkels, MD  

## 2023-11-14 NOTE — Telephone Encounter (Signed)
 Form placed up front for pick up.   Copy made for batch scanning.   Mychart message sent to patient.

## 2023-11-15 ENCOUNTER — Ambulatory Visit: Payer: Self-pay | Admitting: Physical Therapy

## 2023-11-20 ENCOUNTER — Encounter: Payer: Self-pay | Admitting: Family Medicine

## 2023-11-20 ENCOUNTER — Encounter: Payer: Self-pay | Admitting: Physical Therapy

## 2023-11-20 ENCOUNTER — Telehealth: Payer: Self-pay

## 2023-11-20 ENCOUNTER — Ambulatory Visit (INDEPENDENT_AMBULATORY_CARE_PROVIDER_SITE_OTHER): Admitting: Family Medicine

## 2023-11-20 ENCOUNTER — Ambulatory Visit: Payer: Self-pay | Admitting: Physical Therapy

## 2023-11-20 VITALS — BP 114/85 | HR 89 | Ht 68.0 in | Wt 297.1 lb

## 2023-11-20 DIAGNOSIS — G894 Chronic pain syndrome: Secondary | ICD-10-CM

## 2023-11-20 DIAGNOSIS — M25511 Pain in right shoulder: Secondary | ICD-10-CM | POA: Diagnosis not present

## 2023-11-20 DIAGNOSIS — M6281 Muscle weakness (generalized): Secondary | ICD-10-CM

## 2023-11-20 NOTE — Progress Notes (Signed)
    SUBJECTIVE:   CHIEF COMPLAINT / HPI:   Chronic pain / Fibromyalgia concern -Has had chronic pain throughout body over last few years -Pain throughout upper and lower proximal joints and extremities  -Pain limits ability to perform physical tasks including ambulation  -Recently tried on Lyrica  - did not help, may have made symptoms worse  -Interested in Rheumatology & Pain referral   PERTINENT  PMH / PSH: Chronic pain, Bipolar 1, Anxiety, T2DM  OBJECTIVE:   BP 114/85   Pulse 89   Ht 5' 8 (1.727 m)   Wt 297 lb 2 oz (134.8 kg)   SpO2 100%   BMI 45.18 kg/m   General: No acute distress.  CV: Normal S1/S2. No extra heart sounds. Warm and well-perfused. Pulm: Breathing comfortably on room air. CTAB. No increased WOB. Skin:  Warm, dry. Ext: Tender to palpation of bilateral shoulders, arms, hips, legs, ankles. ROM of upper extremities limited by pain. No obvious joint swelling or deformity.  Psych: Pleasant and appropriate.    ASSESSMENT/PLAN:   Assessment & Plan Chronic pain disorder Per chart review, patient with likely fibromyalgia as prior labwork and imaging have been unremarkable. Patient appears to meet several ARC criteria for fibromyalgia.  -Referral placed for Rheumatology for further fibromyalgia assessment/management -Referral placed for Pain clinic -Encouraged connecting with therapy and discussed resource for finding therapist    PCP FU as needed.  Damien Cassis, MD [facility]

## 2023-11-20 NOTE — Therapy (Addendum)
 OUTPATIENT PHYSICAL THERAPY SHOULDER TREATMENT/DISCHARGE  PHYSICAL THERAPY DISCHARGE SUMMARY  Visits from Start of Care: 15  Current functional level related to goals / functional outcomes: See goals and objective   Remaining deficits: See goals and objective   Education / Equipment: HEP   Patient agrees to discharge. Patient goals were partially met. Patient is being discharged due to being pleased with the current functional level.   Patient Name: [name] [name] MRN: [id number] DOB:01/09/1976, 48 y.o., female Today's Date: 11/20/2023  END OF SESSION:  PT End of Session - 11/20/23 0717     Visit Number 15    Number of Visits 17    Date for PT Re-Evaluation 11/21/23    Authorization Type [place] [employer]    Authorization Time Period 27 visit limit for calendar year ( used 9 in earlier episode)    Authorization - Visit Number 23    Authorization - Number of Visits 27    PT Start Time 0715    PT Stop Time 0745    PT Time Calculation (min) 30 min              Past Medical History:  Diagnosis Date   Allergy    Anemia    Arthritis    Bipolar 1 disorder (HCC)    Sees Dr. Kermit, psychology,  [phone]   Carpal tunnel syndrome    Depression    Diabetes mellitus without complication (HCC)    Family history of adverse reaction to anesthesia    mother & son have difficulty waking   Fatigue    Ganglion of left wrist 07/26/2018   Lung nodule seen on imaging study 09/24/2013   Peripheral vascular disease (HCC)    Sleep apnea    uses cpap   Past Surgical History:  Procedure Laterality Date   ABDOMINAL HYSTERECTOMY     CHOLECYSTECTOMY N/A 06/22/2017   Procedure: LAPAROSCOPIC CHOLECYSTECTOMY;  Surgeon: Stevie Herlene Righter, MD;  Location: [facility] OR;  Service: General;  Laterality: N/A;   I & D EXTREMITY Right 09/14/2015   Procedure: IRRIGATION AND DEBRIDEMENT RIGHT MIDDLE FINGER;  Surgeon: Elsie Mussel, MD;  Location: [facility] OR;  Service: Orthopedics;  Laterality: Right;    POSTERIOR LUMBAR FUSION 2 WITH HARDWARE REMOVAL Right 08/15/2023   Procedure: ARTHROSCOPY, SHOULDER WITH DEBRIDEMENT;  Surgeon: Jerri Kay HERO, MD;  Location: [facility];  Service: Orthopedics;  Laterality: Right;   TUBAL LIGATION     Patient Active Problem List   Diagnosis Date Noted   Chronic pain disorder 11/09/2023   Routine adult health maintenance 09/21/2023   Partial nontraumatic tear of right rotator cuff 08/15/2023   Impingement syndrome of right shoulder 08/15/2023   Excessive daytime sleepiness 12/27/2022   Sleep attack 12/27/2022   Nightmare disorder 12/27/2022   Snoring 12/27/2022   Chronic right shoulder pain 11/16/2022   ADHD 01/03/2022   Urine frequency 08/08/2018   OSA on CPAP 02/09/2016   Diabetes mellitus (HCC) 07/11/2012   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 W/O OBSTRUCTION 05/03/2006    PCP: Marlee Lynwood NOVAK, MD  REFERRING PROVIDER: Jule Ronal CROME, PA-C  REFERRING DIAG: [phone] (ICD-10-CM) - History of arthroscopy of right shoulder   THERAPY DIAG:  Muscle weakness (generalized)  Acute pain  of right shoulder  Rationale for Evaluation and Treatment: Rehabilitation  ONSET DATE: 08/15/2023  SUBJECTIVE:                                                                                                                                                                                      SUBJECTIVE STATEMENT: The shoulder pain from surgery is very minimal. The pain I have now is from fibromyalgia.   Hand dominance: Right  PERTINENT HISTORY: DM II, bipolar, depression  PAIN:  Are you having pain?  Yes: NPRS scale: 3/10 Worst: 10/10 Pain location: R anterior shoulder Pain description:  sharp, sore Aggravating factors: OH reaching, lifting Relieving factors: rest  Pain:  Are you having pain? Yes Pain location: bil LE NPRS scale:  highest 10/10 current 9/10  best 1/10 Aggravating factors: standing for longer periods (30 min) Relieving factors: sleeping   PRECAUTIONS: avoid resisted elbow flexion  RED FLAGS: None   WEIGHT BEARING RESTRICTIONS: No  FALLS:  Has patient fallen in last 6 months? No  LIVING ENVIRONMENT: Lives with: lives with their family Lives in: House/apartment Stairs: No Has following equipment at home: None  OCCUPATION: Not working  PLOF: Independent  PATIENT GOALS: get back   NEXT MD VISIT: 09/28/2023- rescheduled 10/05/23  OBJECTIVE:  Note: Objective measures were completed at Evaluation unless otherwise noted.  DIAGNOSTIC FINDINGS:  See imaging   PATIENT SURVEYS:  Quick DASH: 80% disability 45.5% 11/07/23  COGNITION: Overall cognitive status: Within functional limits for tasks assessed     SENSATION:// //Light touch: Impaired - anterior R UE  POSTURE: Rounded shoulder, large body habitus  UPPER EXTREMITY ROM:   Active ROM Right eval Left eval Right 09/28/23 Right  10/17/23 Right 10/31/23 Right 11/07/23 Right 11/20/23  Shoulder flexion 60 WFL 135 145   145  Shoulder extension         Shoulder abduction 83 WFL 120 132   135  Shoulder adduction         Shoulder internal rotation  WFL Reach to right low back  Reach to right low back  Reach to L3 Reach to L3 Reach L3  Shoulder external rotation 25 WFL Reach to T2     Reach T2  Elbow flexion         Elbow extension         Wrist flexion         Wrist extension         Wrist ulnar deviation         Wrist radial deviation         Wrist pronation         Wrist supination         (  Blank rows = not tested)  UPPER EXTREMITY MMT:  MMT Right eval Left eval Right 10/17/23 Right 11/20/23   Shoulder flexion DNT 4 3+- 4-  Shoulder extension      Shoulder abduction DNT  4 3+ 4  Shoulder adduction      Shoulder internal rotation DNT 4 4 4+  Shoulder external rotation DNT  4 4  Middle trapezius      Lower trapezius      Elbow flexion      Elbow extension      Wrist flexion      Wrist extension      Wrist ulnar deviation      Wrist radial deviation      Wrist pronation      Wrist supination      Grip strength (lbs)      (Blank rows = not tested)  SHOULDER SPECIAL TESTS: DNT  JOINT MOBILITY TESTING:  GH hypomobility  PALPATION:  TTP to R anterior deltoid, R infraspinatus    TREATMENT: [facility] Adult PT Treatment:                                                DATE: 11/20/23 Therapeutic Exercise: Review of HEP UBE  Therapeutic Activity: MMT/AROM       PATIENT EDUCATION: Education details: , HEP, POC Person educated: Patient Education method: Explanation, Demonstration, and Handouts Education comprehension: verbalized understanding and returned demonstration  HOME EXERCISE PROGRAM: Access Code: [email or web address] URL: [email or web address] Date: 11/20/2023 Prepared by: Harlene Persons  Exercises - Supine Shoulder Flexion Extension Full Range AROM  - 1 x daily - 7 x weekly - 2 sets - 10 reps - Standing Shoulder Extension ROM with Dowel  - 1 x daily - 7 x weekly - 1-2 sets - 10 reps - 5 hold - Standing Shoulder Internal Rotation AAROM Behind Back with Towel   - 1 x daily - 7 x weekly - 1-2 sets - 10 reps - 5 hold - Standing Shoulder Flexion to 90 Degrees with Dumbbells  - 1 x daily - 7 x weekly - 1-3 sets - 10 reps - Standing Single Arm Shoulder Abduction with Dumbbell - Palm Down  - 1 x daily - 7 x weekly - 1-3 sets - 10 reps - Standing Shoulder Row with Anchored Resistance  - 1 x daily - 7 x weekly - 2 sets - 10 reps - Shoulder extension with resistance - Neutral  - 1 x daily - 7 x weekly - 2 sets - 10 reps - Shoulder External Rotation with Anchored Resistance  - 1 x daily - 7 x weekly - 2-3 sets - 10 reps - Shoulder Internal  Rotation with Resistance  - 1 x daily - 7 x weekly - 2-3 sets - 10 reps  LBP  Access Code: [email or web address] URL: [email or web address] Date: 11/13/2023 Prepared by: Helene Gasmen  Exercises - Supine Posterior Pelvic Tilt  - 2 x daily - 7 x weekly - 2 sets - 10 reps - 5'' hold - Supine Hip Adduction Isometric with Ball  - 1 x daily - 7 x weekly - 2 sets - 10 reps - 10'' hold - Hooklying Isometric Clamshell  - 1 x daily - 7 x weekly - 3 sets - 10 reps - Bridge  - 1 x daily - 7 x weekly - 3 sets - 10  reps - 5-10'' hold  ASSESSMENT:  CLINICAL IMPRESSION: Pt has decided that she would like to be discharged from formal PT. She believes her generalized pain is due to fibromyalgia and does not feel the new referral for PT focused on her low back will be beneficial as she has had an episode of care for low back previously this year. This most recent episode has focused on post surgical rehab for right shoulder. She reports that she is using her RUE for her basic ADLs and is confident in her advanced HEP post discharge. Her strength has reached 4/5 except for shoulder flexion. Her AROM is functional and she will continue to work on IR. She has met or partially met all LTGS related to shoulder.    EVAL: Patient is a 48 y.o. F who was seen today for physical therapy evaluation and treatment s/p R shoulder arthroscopic debridement, SAD, and biceps tenotomy performed by Dr. Jerri on 08/15/2023. Physical findings are consistent with surgery and recovery time post op as she demonstrates decrease R shoulder ROM and strength. Quick DASH shows severe disability in performance of home ADLs and community activities. Pt would benefit form skilled PT services post op working on improving proximal strength and shoulder function.   OBJECTIVE IMPAIRMENTS: decreased activity tolerance, decreased endurance, decreased mobility, decreased ROM, decreased strength, postural dysfunction, and pain   ACTIVITY  LIMITATIONS: carrying, lifting, reach over head, and hygiene/grooming  PARTICIPATION LIMITATIONS: meal prep, cleaning, driving, shopping, community activity, and yard work  PERSONAL FACTORS: Time since onset of injury/illness/exacerbation and 3+ comorbidities: DM II, bipolar, depression are also affecting patient's functional outcome.   REHAB POTENTIAL: Good  CLINICAL DECISION MAKING: Evolving/moderate complexity  EVALUATION COMPLEXITY: Moderate   GOALS: Goals reviewed with patient? No  SHORT TERM GOALS: Target date: 09/19/2023   Pt will be compliant and knowledgeable with initial HEP for improved comfort and carryover Baseline: initial HEP given  Goal status: MET  2.  Pt will self report right shoulder pain no greater than 6/10 for improved comfort and functional ability Baseline: 10/10 at worst 7/25/2: 8-9/10 this morning  10/17/23: intermittent twinges Goal status: MET   LONG TERM GOALS: Target date: 11/21/2023   Pt will decrease Quick DASH disability score to no greater than 50% as proxy for functional improvement Baseline: 80% disability 11/07/23: 45.5% Goal status: MET  2.  Pt will self report right shoulder pain no greater than 3/10 for improved comfort and functional ability Baseline: 10/10 at worst 11/20/23: surgical pain is minor now however fibromyalgia pain in shoulder can still be 10/10 Goal status: PARTIALLY MET     3.  Pt will improve R shoulder flex to at least 140 degrees for improved functional with home and community activities  Baseline: see ROM chart Goal status: MET  4.  Pt will improve R shoulder MMT to at least 4/5 for improved functional ability and shoulder stability with OH motion Baseline: DNT 11/20/23: met except for 4-/5 shoulder flexion Goal status: PARTIALLY MET  5. Yahayra will show a >/= 12 pt improvement in their ODI score (MCID is 12% or 6/50 pts) as a proxy for functional improvement   Evaluation/Baseline: 28/50 pts Goal status:  DEFERRED   6. Rayme will improve 30'' STS (MCID 2) to >/= 5x (w/ UE?: y) to show improved LE strength and improved transfers   Evaluation/Baseline: 3x  w/ UE? y Goal status: DEFERRED     PLAN:  PT FREQUENCY: 2x/week  PT DURATION: 8 weeks  PLANNED  INTERVENTIONS: 97164- PT Re-evaluation, 97110-Therapeutic exercises, 97530- Therapeutic activity, [id number]- Neuromuscular re-education, 97535- Self Care, 02859- Manual therapy, G0283- Electrical stimulation (unattended), [phone]- Electrical stimulation (manual), [id number]- Vasopneumatic device, [id number] (1-2 muscles), 20561 (3+ muscles)- Dry Needling, Cryotherapy, and Moist heat    Harlene CHRISTELLA Persons PTA 11/20/23 7:57 AM Phone: [phone] Fax: [phone]

## 2023-11-20 NOTE — Assessment & Plan Note (Signed)
 Per chart review, patient with likely fibromyalgia as prior labwork and imaging have been unremarkable. Patient appears to meet several ARC criteria for fibromyalgia.  -Referral placed for Rheumatology for further fibromyalgia assessment/management -Referral placed for Pain clinic -Encouraged connecting with therapy and discussed resource for finding therapist

## 2023-11-20 NOTE — Patient Instructions (Addendum)
 Thank you for visiting clinic today and allowing us  to participate in your care!  We placed referrals to Rheumatology and Pain team. Someone should contact you over the next couple of weeks about scheduling your appointment.   Go to [email or web address] to search for therapists/counselors.   Please schedule an appointment with your PCP as needed.   Reach out any time with any questions or concerns you may have - we are here for you!  Damien Cassis, MD [facility] [phone]

## 2023-11-20 NOTE — Telephone Encounter (Signed)
 Patient calls nurse line for multiple reasons.   Patient is asking about status of handicap placard. Advised that this has been completed and placed up front for pick up.  Patient is requesting referrals to either pain management or orthopedic specialist regarding ongoing pain. She is also needing a letter for work stating that she is unable to work at this time.  Advised patient that she would need follow up visit for appropriate referral placement and work documentation. Offered appt with PCP on Friday. Patient is requesting same day appt as she lives in [place] and is currently in [place] for another appointment.  Scheduled same day visit with Dr. Diona for referrals and work note.  Chiquita JAYSON English, RN

## 2023-11-22 ENCOUNTER — Encounter: Payer: Self-pay | Admitting: Physical Therapy

## 2023-11-27 ENCOUNTER — Ambulatory Visit: Payer: Self-pay | Admitting: Physical Therapy

## 2023-11-29 ENCOUNTER — Telehealth: Payer: Self-pay

## 2023-11-29 NOTE — Telephone Encounter (Signed)
 Patient called stating that the letter that she received putting her out of work did don't state that it was due to her [employer]. Asks if that can be corrected please

## 2023-12-04 ENCOUNTER — Ambulatory Visit: Payer: Self-pay | Admitting: Physical Therapy

## 2023-12-07 ENCOUNTER — Other Ambulatory Visit (HOSPITAL_COMMUNITY): Payer: Self-pay

## 2023-12-07 ENCOUNTER — Other Ambulatory Visit: Payer: Self-pay | Admitting: Student

## 2023-12-07 DIAGNOSIS — Z794 Long term (current) use of insulin: Secondary | ICD-10-CM

## 2023-12-10 ENCOUNTER — Encounter (HOSPITAL_COMMUNITY): Payer: Self-pay

## 2023-12-10 ENCOUNTER — Other Ambulatory Visit (HOSPITAL_COMMUNITY): Payer: Self-pay

## 2023-12-11 ENCOUNTER — Encounter: Payer: Self-pay | Admitting: Family Medicine

## 2023-12-11 ENCOUNTER — Encounter: Payer: Self-pay | Admitting: Physical Therapy

## 2023-12-13 ENCOUNTER — Other Ambulatory Visit (HOSPITAL_COMMUNITY): Payer: Self-pay

## 2023-12-13 ENCOUNTER — Other Ambulatory Visit: Payer: Self-pay

## 2023-12-25 ENCOUNTER — Other Ambulatory Visit: Payer: Self-pay

## 2023-12-25 ENCOUNTER — Other Ambulatory Visit (HOSPITAL_COMMUNITY): Payer: Self-pay

## 2023-12-25 DIAGNOSIS — K5900 Constipation, unspecified: Secondary | ICD-10-CM

## 2023-12-26 ENCOUNTER — Other Ambulatory Visit (HOSPITAL_COMMUNITY): Payer: Self-pay

## 2023-12-26 MED ORDER — POLYETHYLENE GLYCOL 3350 17 GM/SCOOP PO POWD
17.0000 g | Freq: Every day | ORAL | 0 refills | Status: AC
  Filled 2024-01-07: qty 238, 14d supply, fill #0
  Filled 2024-04-11: qty 238, 14d supply, fill #1

## 2024-01-02 ENCOUNTER — Encounter: Payer: Self-pay | Admitting: Physical Medicine & Rehabilitation

## 2024-01-07 ENCOUNTER — Encounter: Payer: Self-pay | Admitting: Radiology

## 2024-01-07 ENCOUNTER — Other Ambulatory Visit: Payer: Self-pay

## 2024-01-08 ENCOUNTER — Other Ambulatory Visit (HOSPITAL_COMMUNITY): Payer: Self-pay

## 2024-01-08 ENCOUNTER — Other Ambulatory Visit: Payer: Self-pay

## 2024-01-08 MED ORDER — LAMOTRIGINE 100 MG PO TABS
200.0000 mg | ORAL_TABLET | Freq: Every day | ORAL | 2 refills | Status: DC
  Filled 2024-01-08: qty 60, 30d supply, fill #0
  Filled 2024-02-11: qty 60, 30d supply, fill #1
  Filled 2024-03-07: qty 60, 30d supply, fill #2

## 2024-01-09 ENCOUNTER — Other Ambulatory Visit (HOSPITAL_COMMUNITY): Payer: Self-pay

## 2024-01-11 ENCOUNTER — Other Ambulatory Visit: Payer: Self-pay

## 2024-01-11 ENCOUNTER — Other Ambulatory Visit (HOSPITAL_COMMUNITY): Payer: Self-pay

## 2024-01-11 MED ORDER — VENLAFAXINE HCL ER 150 MG PO CP24
150.0000 mg | ORAL_CAPSULE | Freq: Every day | ORAL | 3 refills | Status: AC
  Filled 2024-01-11: qty 30, 30d supply, fill #0
  Filled 2024-02-11: qty 30, 30d supply, fill #1
  Filled 2024-03-07: qty 30, 30d supply, fill #2
  Filled 2024-04-11 (×2): qty 30, 30d supply, fill #3

## 2024-01-12 ENCOUNTER — Other Ambulatory Visit (HOSPITAL_COMMUNITY): Payer: Self-pay

## 2024-01-14 ENCOUNTER — Other Ambulatory Visit (HOSPITAL_COMMUNITY): Payer: Self-pay

## 2024-02-08 ENCOUNTER — Encounter: Admitting: Physical Medicine & Rehabilitation

## 2024-02-11 ENCOUNTER — Other Ambulatory Visit (HOSPITAL_COMMUNITY): Payer: Self-pay

## 2024-02-11 ENCOUNTER — Telehealth (HOSPITAL_COMMUNITY): Payer: Self-pay

## 2024-02-11 ENCOUNTER — Other Ambulatory Visit: Payer: Self-pay

## 2024-02-11 ENCOUNTER — Other Ambulatory Visit: Payer: Self-pay | Admitting: Neurology

## 2024-02-11 NOTE — Telephone Encounter (Signed)
 PA request has been Received. New Encounter has been or will be created for follow up. For additional info see Pharmacy Prior Auth telephone encounter from 02/11/24.

## 2024-02-11 NOTE — Telephone Encounter (Signed)
 Pharmacy Patient Advocate Encounter   Received notification from Pt Calls Messages that prior authorization for FreeStyle Libre 3 Plus Sensor  is required/requested.   Insurance verification completed.   The patient is insured through [facility] MEDICAID.   Per test claim: PA required; PA submitted to above mentioned insurance via Latent Key/confirmation #/EOC [id number] Status is pending

## 2024-02-13 ENCOUNTER — Other Ambulatory Visit (HOSPITAL_COMMUNITY): Payer: Self-pay

## 2024-02-13 NOTE — Telephone Encounter (Signed)
 Pharmacy Patient Advocate Encounter  Received notification from [facility] MEDICAID that Prior Authorization for FreeStyle Libre 3 Plus Sensor has been DENIED.  Full denial letter letter has been uploaded to media tab   PA #/Case ID/Reference #: [id number]

## 2024-02-14 ENCOUNTER — Other Ambulatory Visit: Payer: Self-pay | Admitting: Neurology

## 2024-02-14 ENCOUNTER — Other Ambulatory Visit: Payer: Self-pay

## 2024-02-14 ENCOUNTER — Other Ambulatory Visit (HOSPITAL_COMMUNITY): Payer: Self-pay

## 2024-02-14 MED ORDER — METFORMIN HCL ER 500 MG PO TB24
1000.0000 mg | ORAL_TABLET | Freq: Two times a day (BID) | ORAL | 2 refills | Status: AC
  Filled 2024-02-14: qty 360, 90d supply, fill #0

## 2024-02-14 NOTE — Telephone Encounter (Signed)
 Reviewed and agree.

## 2024-02-15 ENCOUNTER — Other Ambulatory Visit (HOSPITAL_COMMUNITY): Payer: Self-pay

## 2024-02-18 ENCOUNTER — Other Ambulatory Visit (HOSPITAL_COMMUNITY): Payer: Self-pay

## 2024-02-26 ENCOUNTER — Ambulatory Visit

## 2024-02-26 VITALS — BP 110/71 | HR 86 | Temp 96.3°F | Resp 16 | Ht 68.5 in | Wt 294.6 lb

## 2024-02-26 DIAGNOSIS — M797 Fibromyalgia: Secondary | ICD-10-CM | POA: Insufficient documentation

## 2024-02-26 DIAGNOSIS — M2550 Pain in unspecified joint: Secondary | ICD-10-CM | POA: Diagnosis not present

## 2024-02-26 DIAGNOSIS — R5383 Other fatigue: Secondary | ICD-10-CM | POA: Insufficient documentation

## 2024-02-26 NOTE — Patient Instructions (Signed)
 Explore the [place] [employer] [place]  [employer] [email or web address]

## 2024-02-26 NOTE — Progress Notes (Addendum)
 "  Office Visit Note  Patient: [name] [name]             Date of Birth: 05/01/1975           MRN: [id number]             PCP: Jerrie Gathers, DO Referring: McDiarmid, Krystal BIRCH, MD Visit Date: 02/26/2024 Occupation: PRESCHOOL TEACH  Subjective:  New Patient (Initial Visit) (Joint Pain, Fibromyalgia, has appointment with pain management in January 2026 )   Discussed the use of AI scribe software for clinical note transcription with the patient, who gave verbal consent to proceed.  History of Present Illness [name] [name] is a 48 year old female with fibromyalgia who presents with chronic pain management. She was referred by her primary care doctor for management of fibromyalgia.  She experiences chronic pain primarily in her legs and arms, with the most significant pain in her thighs and shoulders. The pain is constant, with some days being worse than others, and is exacerbated by physical activity such as lifting. The pain has persisted for over ten years, with a recent worsening since May, significantly affecting her ability to work and perform daily activities. She also experiences significant fatigue and 'brain fog', describing it as taking longer to 'land' her thoughts. Increased sensitivity to touch is noted, with even a hug being painful.  Her current medications include venlafaxine  and Lamictal  for bipolar disorder, and she notes that missing doses of these medications worsens her pain. She has previously tried gabapentin  and Lyrica  without success. Over-the-counter medications like Advil  and Tylenol  do not alleviate her pain and tend to make her sleepy.  She has a history of carpal tunnel syndrome since age 41, leading to decreased sensation in her fingertips. She also reports a past MRSA infection in one finger, which required surgical intervention. Her skin is very dry, particularly in the winter.  She has undergone MRIs of her shoulder and back, which did not show any pinched  nerves. 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     Activities of Daily Living:  Patient reports morning stiffness for 1 hour.   Patient Denies nocturnal pain.  Difficulty dressing/grooming: Reports Difficulty climbing stairs: Reports Difficulty getting out of chair: Reports Difficulty using hands for taps, buttons, cutlery, and/or writing: Denies  Review of Systems  Constitutional:  Positive for fatigue.  HENT:  Positive for mouth dryness. Negative for mouth sores.   Eyes:  Negative for dryness.  Respiratory:  Negative for shortness of breath.   Cardiovascular:  Positive for palpitations. Negative for chest pain.  Gastrointestinal:  Positive for constipation and diarrhea. Negative for blood in stool.  Endocrine: Negative for increased urination.  Genitourinary:  Positive for involuntary urination.  Musculoskeletal:  Positive for joint pain, joint pain, joint swelling, myalgias, muscle weakness, morning stiffness, muscle tenderness and myalgias. Negative for gait problem.  Skin:  Negative for color change, rash, hair loss and sensitivity to sunlight.  Allergic/Immunologic: Negative for susceptible to infections.  Neurological:  Negative for dizziness and headaches.  Hematological:  Negative for swollen glands.  Psychiatric/Behavioral:  Positive for depressed mood and sleep disturbance. The patient is nervous/anxious.     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 W/O OBSTRUCTION 05/03/2006    Past Medical History:  Diagnosis Date   Allergy    Anemia    Arthritis    Bipolar 1 disorder (HCC)    Sees Dr. Kermit, psychology,  [phone]   Carpal tunnel syndrome    Depression    Diabetes mellitus without complication (HCC)    Family history of adverse reaction to anesthesia    mother & son have difficulty waking   Fatigue    Ganglion of left wrist 07/26/2018   Lung nodule seen on imaging study 09/24/2013   Peripheral vascular disease    Sleep apnea    uses cpap    Family History  Problem Relation Age of Onset   Migraines Mother    Hyperlipidemia Mother    Hypertension Mother    Diabetes Mellitus II Mother    Fibromyalgia Mother    Congestive Heart Failure Father    Hypertension Father    Diabetes Mellitus II Father    Sleep apnea Father    Hodgkin's lymphoma Father    Kidney disease Father    Asthma Sister    Mental illness Sister    Asthma Brother    Mental illness Brother    Diabetes Maternal Grandmother    Heart disease Maternal Grandmother    Heart disease Maternal Grandfather    Diabetes Maternal Grandfather    Diabetes Paternal Grandmother    Diabetes Paternal Grandfather    Allergies Son    Sleep apnea Son    Asperger's syndrome Son    Diabetes Son    Anxiety disorder Son    Diabetes Daughter    Sleep apnea Daughter    Thyroid cancer Daughter    Colon cancer Neg Hx    Esophageal cancer Neg Hx    Rectal cancer Neg Hx    Stomach cancer Neg Hx    Colon polyps Neg Hx    Past Surgical History:  Procedure Laterality Date   ABDOMINAL HYSTERECTOMY     CHOLECYSTECTOMY N/A 06/22/2017    Procedure: LAPAROSCOPIC CHOLECYSTECTOMY;  Surgeon: Kinsinger, Herlene Righter, MD;  Location: [facility] OR;  Service: General;  Laterality: N/A;   COLONOSCOPY     I & D EXTREMITY Right 09/14/2015   Procedure: IRRIGATION AND DEBRIDEMENT RIGHT MIDDLE FINGER;  Surgeon: Elsie Mussel, MD;  Location: [facility] OR;  Service: Orthopedics;  Laterality: Right;   POSTERIOR LUMBAR FUSION 2 WITH HARDWARE REMOVAL Right 08/15/2023   Procedure: ARTHROSCOPY, SHOULDER WITH DEBRIDEMENT;  Surgeon: Jerri Kay HERO, MD;  Location: [facility];  Service: Orthopedics;  Laterality: Right;   TUBAL LIGATION     Social History[1] Social History   Social History Narrative   Pt lives with son    Pt not working      Immunization History  Administered Date(s) Administered   Influenza Whole 12/20/2006   Td 07/05/1999, 02/12/2009     Objective: Vital Signs: BP  110/71 (BP Location: Left Wrist, Patient Position: Sitting, Cuff Size: Normal)   Pulse 86   Temp (!) 96.3 F (35.7 C)   Resp 16   Ht 5' 8.5 (1.74 m)   Wt 294 lb 9.6 oz (133.6 kg)   BMI 44.14 kg/m    Physical Exam Vitals and nursing note reviewed.  HENT:     Head: Normocephalic and atraumatic.     Nose: Nose normal.  Eyes:     Conjunctiva/sclera: Conjunctivae normal.     Pupils: Pupils are equal, round, and reactive to light.  Pulmonary:     Effort: Pulmonary effort is normal. No respiratory distress.  Musculoskeletal:     Comments: Tenderness to palpation of proximal large muscle groups of B/L UE and LE and distal LE's.  Skin:    General: Skin is warm and dry.  Neurological:     Mental Status: She is alert. Mental status is at baseline.  Psychiatric:        Mood and Affect: Mood normal.        Behavior: Behavior normal.      Musculoskeletal Exam:    Investigation: No additional findings.  Imaging: No results found.  Recent Labs: Lab Results  Component Value Date   WBC 6.2 12/30/2020   HGB 11.6 12/30/2020   PLT 258 12/30/2020    NA 139 08/14/2023   K 3.9 08/14/2023   CL 104 08/14/2023   CO2 26 08/14/2023   GLUCOSE 183 (H) 08/14/2023   BUN 9 08/14/2023   CREATININE 0.55 08/14/2023   BILITOT <0.2 12/30/2020   ALKPHOS 97 12/30/2020   AST 13 12/30/2020   ALT 12 12/30/2020   PROT 6.6 12/30/2020   ALBUMIN 4.1 12/30/2020   CALCIUM  9.3 08/14/2023   GFRAA 105 10/27/2019    Speciality Comments: No specialty comments available.  Procedures:  No procedures performed Allergies: Hydromorphone  hcl, Meperidine  hcl, Morphine, Gramineae pollens, Lisinopril, Sumatriptan, Tape, Wound dressing adhesive, and Zolmitriptan   Assessment / Plan:     Visit Diagnoses:  Fibromyalgia  The patient notes constant fatigue and widespread pain, memory deficits with mental fog, and nonrejuvenating sleep. The patient has a history of widespread pain lasting more than 3 months. The patient's history is not concerning for inflammatory joint disease or inflammatory myositis. The patient's physical exam has no evidence of inflammatory joint disease. This is consistent with a diagnosis of fibromyalgia.  Fibromyalgia is a chronic pain disorder, thought to be a disorder of pain regulation, and often classified as a form of central sensitization. Fibromyalgia is characterized by widespread musculoskeletal pain, accompanied by fatigue, cognitive disturbances, psychiatric symptoms, headaches, paresthesia's, and multiple somatic symptoms. Fibromyalgia is not an inflammatory or autoimmune condition, and immunosuppressive therapy does not treat fibromyalgia. Treatment is both nonpharmacological and pharmacological in nature, and should focus on sleep hygiene, graded exercise programs, pharmacologic therapy (options including TCA (amitriptyline), SNRIs (duloxetine , milnacipran), flexeril , gabapentin /lyrica , naltrexone, TENS), pyschosocial therapy i.e. CBT. The patient will likely benefit from multidisciplinary care by PCP, PT, integrative medicine, psychiatry,  and pain management. These recommendations were discussed with the patient.   PLAN: -TSH, ESR, CRP, CPK, 25-hydroxy vitamin D  ordered (rule out hypothyroid myopathy/myositis, PMR, PM, or DM) -PCP to consider integrative medicine consult and to attempt nonpharmacologic treatment modalities: Acupuncture, chiropractic care, deep breathing, tai chi, mindfulness meditation, etc. -PCP to consider referral to psychiatry/psychology for CBT-I. -Information provided on [employer]  fibroguide: [email or web address]   Bilateral shoulder pain Bilateral hip pain As discussed with patient, the majority  of her symptoms are secondary to fibromyalgia. However, given the acute worsening of her symptoms in her shoulders and hips, need to consider the possibility of a PMR. Will obtain ESR and CRP as discussed above. I have a low suspicion given the symptoms are not classic for PMR. Furthermore, the MRI of her shoulder does show significant injury that would account for her pain. However, will check labs as discussed for completion.    Orders: Orders Placed This Encounter  Procedures   Sedimentation rate   C-reactive protein   TSH + free T4   Vitamin B12   VITAMIN D  25 Hydroxy (Vit-D Deficiency, Fractures)   No orders of the defined types were placed in this encounter.  I personally spent a total of 60 minutes in the care of the patient today including preparing to see the patient, getting/reviewing separately obtained history, performing a medically appropriate exam/evaluation, counseling and educating, placing orders, documenting clinical information in the EHR, independently interpreting results, and communicating results.   Follow-Up Instructions: Return in 6 months (on 08/26/2024), or if symptoms worsen or fail to improve.   Asberry Claw, DO  Note - This record has been created using Animal nutritionist.  Chart creation errors have been sought, but may not  always  have been located. Such creation errors do not reflect on  the standard of medical care.      [1]  Social History Tobacco Use   Smoking status: Never    Passive exposure: Never   Smokeless tobacco: Never  Vaping Use   Vaping status: Never Used  Substance Use Topics   Alcohol use: Not Currently    Comment: occ   Drug use: No   "

## 2024-03-01 LAB — VITAMIN B12: Vitamin B-12: 433 pg/mL (ref 200–1100)

## 2024-03-01 LAB — VITAMIN D 25 HYDROXY (VIT D DEFICIENCY, FRACTURES): Vit D, 25-Hydroxy: 19 ng/mL — ABNORMAL LOW (ref 30–100)

## 2024-03-01 LAB — SEDIMENTATION RATE: Sed Rate: 29 mm/h — ABNORMAL HIGH (ref 0–20)

## 2024-03-01 LAB — TSH+FREE T4: TSH W/REFLEX TO FT4: 2.06 m[IU]/L

## 2024-03-01 LAB — C-REACTIVE PROTEIN: CRP: 6.3 mg/L

## 2024-03-03 ENCOUNTER — Telehealth: Payer: Self-pay | Admitting: Pharmacist

## 2024-03-03 NOTE — Telephone Encounter (Signed)
 Attempted to contact patient for follow-up of request to contact her and discuss elevated glucose readings on 12/23   Contacted patient, reviewed recent CGM of previousl 7 days and only the last two were significantly out of range.    States tolerating Trulicity  (dulaglutide ) well.  States clothing is fitting better.  May consider dose increase at next visit here at [facility].   No change today.  Patient encouraged to continue making progress toward weight loss.   Total time with patient call and documentation of interaction: 12 minutes.

## 2024-03-04 ENCOUNTER — Ambulatory Visit: Payer: Self-pay

## 2024-03-07 ENCOUNTER — Other Ambulatory Visit: Payer: Self-pay | Admitting: Family Medicine

## 2024-03-07 ENCOUNTER — Other Ambulatory Visit (HOSPITAL_COMMUNITY): Payer: Self-pay

## 2024-03-10 ENCOUNTER — Other Ambulatory Visit (HOSPITAL_COMMUNITY): Payer: Self-pay

## 2024-03-10 MED ORDER — FREESTYLE LIBRE 3 PLUS SENSOR MISC
11 refills | Status: AC
  Filled 2024-03-10 – 2024-04-11 (×5): qty 2, 30d supply, fill #0

## 2024-03-11 ENCOUNTER — Telehealth (HOSPITAL_COMMUNITY): Payer: Self-pay

## 2024-03-11 ENCOUNTER — Encounter (HOSPITAL_COMMUNITY): Payer: Self-pay

## 2024-03-11 ENCOUNTER — Other Ambulatory Visit (HOSPITAL_COMMUNITY): Payer: Self-pay

## 2024-03-11 ENCOUNTER — Encounter (HOSPITAL_BASED_OUTPATIENT_CLINIC_OR_DEPARTMENT_OTHER): Payer: Self-pay | Admitting: Orthopaedic Surgery

## 2024-03-12 NOTE — Progress Notes (Unsigned)
 "  Office Visit Note  Patient: [name] [name]             Date of Birth: 1975-09-10           MRN: [id number]             PCP: Jerrie Gathers, DO Referring: Jerrie Gathers, DO Visit Date: 03/17/2024 Occupation: PRESCHOOL TEACH  Subjective:  No chief complaint on file.  Discussed the use of AI scribe software for clinical note transcription with the patient, who gave verbal consent to proceed.  History of Present Illness [name] [name] is a 49 year old female with fibromyalgia who presents for lab results follow-up.  She continues to admit to a change in her baseline fibromyalgia symptoms, specifically noting increased pain in her shoulders and hips. The shoulder pain is intermittent, while the pain in her hips and legs is daily. She experiences morning stiffness but notes that the pain is not consistently worse in the morning. She is unsure of what triggers the worsening of her symptoms, although she recalls a particularly severe episode last week.  She has a history of diabetes and mentions that her blood sugar levels have been high recently. She expresses concern about the potential impact of steroids on her diabetes, as she has experienced increased pain with injections in the past.  She has been experiencing low vitamin D  levels, with a recent measurement of 19. She recalls a previous level of 14 three years ago and has taken medication in the past to boost her vitamin D  levels.  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 She wants to return to work if her pain improves.  Discussed lab work findings with patient.    Activities of Daily Living:  Patient reports morning stiffness for 30 minutes.   Patient Reports nocturnal pain.  Difficulty dressing/grooming: Reports Difficulty climbing stairs: Reports Difficulty getting out of chair: Reports Difficulty using hands for taps, buttons,  cutlery, and/or writing: Reports  Review of Systems  Constitutional:  Positive for fatigue.  HENT:  Negative for mouth sores and mouth dryness.   Eyes:  Negative for dryness.  Respiratory:  Negative for shortness of breath.   Cardiovascular:  Positive for palpitations. Negative for chest pain.  Gastrointestinal:  Positive for constipation. Negative for blood in stool and diarrhea.  Endocrine: Negative for increased urination.  Genitourinary:  Positive for involuntary urination.  Musculoskeletal:  Positive for joint pain, gait problem, joint pain, myalgias, muscle weakness, morning stiffness, muscle tenderness and myalgias. Negative for joint swelling.  Skin:  Negative for color change, rash, hair loss and sensitivity to sunlight.  Allergic/Immunologic: Negative for susceptible to infections.  Neurological:  Negative for dizziness and headaches.  Hematological:  Negative for swollen glands.  Psychiatric/Behavioral:  Positive for sleep disturbance. Negative for depressed mood. The patient is nervous/anxious.     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 W/O OBSTRUCTION 05/03/2006    Past Medical  History:  Diagnosis Date   Allergy    Anemia    Arthritis    Bipolar 1 disorder (HCC)    Sees Dr. Kermit, psychology,  [phone]   Carpal tunnel syndrome    Depression    Diabetes mellitus without complication (HCC)    Family history of adverse reaction to anesthesia    mother & son have difficulty waking   Fatigue    Ganglion of left wrist 07/26/2018   Lung nodule seen on imaging study 09/24/2013   Peripheral vascular disease    Sleep apnea    uses cpap    Family History  Problem Relation Age of Onset   Migraines Mother    Hyperlipidemia Mother    Hypertension Mother    Diabetes Mellitus II Mother    Fibromyalgia Mother    Congestive Heart Failure Father    Hypertension Father    Diabetes Mellitus II Father    Sleep apnea Father    Hodgkin's lymphoma Father    Kidney disease Father    Asthma Sister    Mental illness Sister    Asthma Brother    Mental illness Brother    Diabetes Maternal Grandmother    Heart disease Maternal Grandmother    Heart disease Maternal Grandfather    Diabetes Maternal Grandfather    Diabetes Paternal Grandmother    Diabetes Paternal Grandfather    Allergies Son    Sleep apnea Son    Asperger's syndrome Son    Diabetes Son    Anxiety disorder Son    Diabetes Daughter    Sleep apnea Daughter    Thyroid cancer Daughter    Colon cancer Neg Hx    Esophageal cancer Neg Hx    Rectal cancer Neg Hx    Stomach cancer Neg Hx    Colon polyps Neg Hx    Past Surgical History:  Procedure Laterality Date   ABDOMINAL HYSTERECTOMY     CHOLECYSTECTOMY N/A 06/22/2017   Procedure: LAPAROSCOPIC CHOLECYSTECTOMY;  Surgeon: Kinsinger, Herlene Righter, MD;  Location: [facility] OR;  Service: General;  Laterality: N/A;   COLONOSCOPY     I & D EXTREMITY Right 09/14/2015   Procedure: IRRIGATION AND DEBRIDEMENT RIGHT MIDDLE FINGER;  Surgeon: Elsie Mussel, MD;  Location: [facility] OR;  Service: Orthopedics;  Laterality: Right;   TUBAL LIGATION     Social  History[1] Social History   Social History Narrative   Pt lives with son    Pt not working      Immunization History  Administered Date(s) Administered   Influenza Whole 12/20/2006   Td 07/05/1999, 02/12/2009     Objective: Vital Signs: BP 123/82   Pulse (!) 106   Temp (!) 97.5 F (36.4 C)   Resp 16   Ht 5' 8.5 (1.74 m)   Wt 293 lb (132.9 kg)   BMI 43.90 kg/m    Physical Exam Vitals and nursing note reviewed.  HENT:     Head: Normocephalic and atraumatic.     Nose: Nose normal.  Eyes:     Conjunctiva/sclera: Conjunctivae normal.     Pupils: Pupils are equal, round, and reactive to light.  Pulmonary:  Effort: Pulmonary effort is normal. No respiratory distress.  Skin:    General: Skin is warm and dry.  Neurological:     Mental Status: She is alert. Mental status is at baseline.  Psychiatric:        Mood and Affect: Mood normal.        Behavior: Behavior normal.      Musculoskeletal Exam:   CDAI Exam: CDAI Score: -- Patient Global: --; Provider Global: -- Swollen: --; Tender: -- Joint Exam 03/17/2024   No joint exam has been documented for this visit   There is currently no information documented on the homunculus. Go to the Rheumatology activity and complete the homunculus joint exam.  Investigation: No additional findings.  Imaging: No results found.  Recent Labs: Lab Results  Component Value Date   WBC 6.2 12/30/2020   HGB 11.6 12/30/2020   PLT 258 12/30/2020   NA 139 08/14/2023   K 3.9 08/14/2023   CL 104 08/14/2023   CO2 26 08/14/2023   GLUCOSE 183 (H) 08/14/2023   BUN 9 08/14/2023   CREATININE 0.55 08/14/2023   BILITOT <0.2 12/30/2020   ALKPHOS 97 12/30/2020   AST 13 12/30/2020   ALT 12 12/30/2020   PROT 6.6 12/30/2020   ALBUMIN 4.1 12/30/2020   CALCIUM  9.3 08/14/2023   GFRAA 105 10/27/2019    Speciality Comments: No specialty comments available.  Procedures:  No procedures performed Allergies: Hydromorphone  hcl,  Meperidine  hcl, Morphine, Gramineae pollens, Lisinopril, Sumatriptan, Tape, Wound dressing adhesive, and Zolmitriptan   Assessment / Plan:     Visit Diagnoses:  PMR (polymyalgia rheumatica) Patient with bilateral shoulder and hip pain w/ elevated inflammatory markers, highly consistent with diagnosis of polymyalgia rheumatica. As discussed with patient, I do suspect that this would only be etiology of her bilateral hip and shoulder pain, and her chronic diffuse pain is secondary to her fibromyalgia.   Also discussed with patient, most recent ACR 2015 guidelines recommend prednisone  dose starting between 12.5-25mg  daily with slow taper to 10mg  daily within 4-8 weeks, then taper by 1mg  q 4 weeks until discontinuation given that remission is maintained. Patient concerned with side effects from prednisone  and does not wish to start PMR treatment at this time. Discussed with patient long term, untreated disease and long-standing inflammation can be dangerous and increase her risk for CVD. She is agreeable to a 3 day trial to see if it has any impact on her symptoms. Pending clinical response, she may consider further treatment with prednisone  or DMARD therapy.  Fibromyalgia The patient notes constant fatigue and widespread pain, memory deficits with mental fog, and nonrejuvenating sleep. The patient has a history of widespread pain lasting more than 3 months. The patient's history is not concerning for inflammatory joint disease or inflammatory myositis. The patient's physical exam has no evidence of inflammatory joint disease. This is consistent with a diagnosis of fibromyalgia.   Fibromyalgia is a chronic pain disorder, thought to be a disorder of pain regulation, and often classified as a form of central sensitization. Fibromyalgia is characterized by widespread musculoskeletal pain, accompanied by fatigue, cognitive disturbances, psychiatric symptoms, headaches, paresthesia's, and multiple somatic  symptoms. Fibromyalgia is not an inflammatory or autoimmune condition, and immunosuppressive therapy does not treat fibromyalgia. Treatment is both nonpharmacological and pharmacological in nature, and should focus on sleep hygiene, graded exercise programs, pharmacologic therapy (options including TCA (amitriptyline), SNRIs (duloxetine , milnacipran), flexeril , gabapentin /lyrica , naltrexone, TENS), pyschosocial therapy i.e. CBT. The patient will likely benefit from multidisciplinary care by  PCP, PT, integrative medicine, psychiatry, and pain management. These recommendations were discussed with the patient.   High risk medication use Starting 3 day prednisone  trial for PMR. Discussed steroid side effects including weight gain, decreased bone density/fractures, risk for infection, avascular necrosis, hypertension, hyperglycemia, dyspepsia/gastric ulcers, fluid retention, weakness, bruising, acne, cataracts, insomnia, mood problems. Patient also instructed to take this medication with food, and not to take this medication with NSAIDs such as Aleve  or Ibuprofen .    Vitamin D  Deficiency Patient with low vitamin D , will start 50,000U q 7 days for 12 weeks with recheck of Vitamin D  at that time.    Orders: No orders of the defined types were placed in this encounter.  Meds ordered this encounter  Medications   predniSONE  (DELTASONE ) 10 MG tablet    Sig: Take 2.5 tablets (25 mg total) by mouth daily with breakfast for 3 days.    Dispense:  8 tablet    Refill:  0   Vitamin D , Ergocalciferol , (DRISDOL ) 1.25 MG (50000 UNIT) CAPS capsule    Sig: Take 1 capsule (50,000 Units total) by mouth every 7 (seven) days.    Dispense:  12 capsule    Refill:  0   I personally spent a total of 40 minutes in the care of the patient today including preparing to see the patient, getting/reviewing separately obtained history, performing a medically appropriate exam/evaluation, counseling and educating, placing orders,  documenting clinical information in the EHR, independently interpreting results, and communicating results.   Follow-Up Instructions: Return in about 2 months (around 05/15/2024).   Asberry Claw, DO  Note - This record has been created using Animal nutritionist.  Chart creation errors have been sought, but may not always  have been located. Such creation errors do not reflect on  the standard of medical care.      [1]  Social History Tobacco Use   Smoking status: Never    Passive exposure: Never   Smokeless tobacco: Never  Vaping Use   Vaping status: Never Used  Substance Use Topics   Alcohol use: Not Currently    Comment: occ   Drug use: No   "

## 2024-03-17 ENCOUNTER — Other Ambulatory Visit (HOSPITAL_COMMUNITY): Payer: Self-pay

## 2024-03-17 ENCOUNTER — Ambulatory Visit

## 2024-03-17 VITALS — BP 123/82 | HR 106 | Temp 97.5°F | Resp 16 | Ht 68.5 in | Wt 293.0 lb

## 2024-03-17 DIAGNOSIS — M797 Fibromyalgia: Secondary | ICD-10-CM | POA: Insufficient documentation

## 2024-03-17 DIAGNOSIS — M353 Polymyalgia rheumatica: Secondary | ICD-10-CM | POA: Diagnosis not present

## 2024-03-17 DIAGNOSIS — Z79899 Other long term (current) drug therapy: Secondary | ICD-10-CM | POA: Diagnosis not present

## 2024-03-17 MED ORDER — PREDNISONE 10 MG PO TABS
25.0000 mg | ORAL_TABLET | Freq: Every day | ORAL | 0 refills | Status: AC
  Filled 2024-03-17: qty 8, 3d supply, fill #0

## 2024-03-17 MED ORDER — VITAMIN D (ERGOCALCIFEROL) 1.25 MG (50000 UNIT) PO CAPS
50000.0000 [IU] | ORAL_CAPSULE | ORAL | 0 refills | Status: AC
  Filled 2024-03-17: qty 12, 84d supply, fill #0

## 2024-03-18 ENCOUNTER — Other Ambulatory Visit (HOSPITAL_COMMUNITY): Payer: Self-pay

## 2024-03-20 ENCOUNTER — Encounter: Attending: Physical Medicine & Rehabilitation | Admitting: Physical Medicine & Rehabilitation

## 2024-03-20 ENCOUNTER — Other Ambulatory Visit (HOSPITAL_COMMUNITY): Payer: Self-pay

## 2024-03-20 ENCOUNTER — Telehealth: Payer: Self-pay

## 2024-03-20 ENCOUNTER — Encounter: Payer: Self-pay | Admitting: Physical Medicine & Rehabilitation

## 2024-03-20 VITALS — BP 118/75 | HR 82 | Ht 68.5 in | Wt 293.0 lb

## 2024-03-20 DIAGNOSIS — M353 Polymyalgia rheumatica: Secondary | ICD-10-CM | POA: Insufficient documentation

## 2024-03-20 DIAGNOSIS — G5603 Carpal tunnel syndrome, bilateral upper limbs: Secondary | ICD-10-CM | POA: Diagnosis not present

## 2024-03-20 DIAGNOSIS — M797 Fibromyalgia: Secondary | ICD-10-CM | POA: Insufficient documentation

## 2024-03-20 DIAGNOSIS — F32A Depression, unspecified: Secondary | ICD-10-CM | POA: Diagnosis not present

## 2024-03-20 MED ORDER — PREGABALIN 75 MG PO CAPS
75.0000 mg | ORAL_CAPSULE | Freq: Two times a day (BID) | ORAL | 2 refills | Status: AC
  Filled 2024-03-20: qty 60, 30d supply, fill #0

## 2024-03-20 NOTE — Telephone Encounter (Signed)
 Patient calls nurse line requesting a prescription for Glipizide .   She reports she saw her Rheumatologist today and reports she put her on Prednisone  for 3 days.  She reports in the past she has been given Glipizide  to assist with glucose control during steroid use.  Advised will forward to PCP.

## 2024-03-20 NOTE — Progress Notes (Signed)
 "  Subjective:    Patient ID: [name] [name], female    DOB: 09/26/1975, 49 y.o.   MRN: [id number]  HPI  Discussed the use of AI scribe software for clinical note transcription with the patient, who gave verbal consent to proceed.    [name] [name] is a 49 y.o. year old female  who  has a past medical history of Allergy, Anemia, Arthritis, Bipolar 1 disorder (HCC), Carpal tunnel syndrome, Depression, Diabetes mellitus without complication (HCC), Family history of adverse reaction to anesthesia, Fatigue, Ganglion of left wrist (07/26/2018), Lung nodule seen on imaging study (09/24/2013), Peripheral vascular disease, and Sleep apnea.   They are presenting to PM&R clinic as a new patient for pain management evaluation. They were referred by Dr. Keneth for treatment of fibromyalgia pain.    The patient presents for initial evaluation of fibromyalgia, diagnosed in July 2025. She reports a long-standing history of widespread pain, with the worst areas being her thighs, hips, shoulders, and biceps.  She reports pain throughout her body. She describes intermittent shooting pain, particularly upon rising. Pain levels fluctuate, with today being a high-pain day with significant movement limitation. She also reports pain in her hands, elbows, feet, and knees, with a history of bilateral carpal tunnel syndrome managed conservatively. Associated symptoms include significant fatigue (wiped out), joint stiffness, poor sleep secondary to restless legs (the ginny legs), constipation managed with Miralax , and cognitive fog (the worst).  She denies any history of back, neck, or joint surgeries.  Past Treatments: - Medications: Ibuprofen , Tylenol , pregabalin  (Lyrica ) 25 mg, gabapentin , meloxicam , and tramadol  have all been ineffective. She is allergic to oxycodone  and hydrocodone . She is currently stable on Lamictal  and Effexor  for a mood disorder, which limits other medication options (e.g., amitriptyline,  duloxetine ) due to concerns for interactions or worsening her mental health. Muscle relaxers cause excessive sedation without benefit. - Physical Therapy: Attended in July 2025 for back pain, which provided some relief for her back but not for her other symptoms. - Injections: A prior right shoulder injection worsened her pain. - Other Modalities: Heat, cold, and topical creams have not provided relief.    Medications tried: Topical medications Tried a few and do not help  Nsaids Ibuprofen  doesn't help, meloxicam  didn't help Tylenol   doesn't help  Opiates  Tramadol - doesn't help  Allergic to oxycodone  and hydrocodone   Lyrica  doesn't help  Gabapentin - doesn't help  TCAs   Sounds familiar  SNRIs She reports MH doctor had concerns about it worsening her mood and cardiac concerns    Other treatments: PT-  July for her back, helped back  TENs unit - Denies use  Injections Rt shoulder cortisone- made pain worse Surgery Denies   Prior UDS results:     Component Value Date/Time   LABOPIA NONE DETECTED 06/21/2017 0911   COCAINSCRNUR NONE DETECTED 06/21/2017 0911   LABBENZ NONE DETECTED 06/21/2017 0911   AMPHETMU NONE DETECTED 06/21/2017 0911   THCU NONE DETECTED 06/21/2017 0911   LABBARB NONE DETECTED 06/21/2017 0911      Pain Inventory Average Pain 8 Pain Right Now 9 My pain is constant, sharp, burning, dull, stabbing, and aching  In the last 24 hours, has pain interfered with the following? General activity 9 Relation with others 0 Enjoyment of life 7 What TIME of day is your pain at its worst? varies Sleep (in general) Poor  Pain is worse with: walking, bending, sitting, inactivity, standing, and some activites Pain improves with: nothing Relief from Meds:  nothing  how many minutes can you walk? unknown ability to climb steps?  yes do you drive?  yes Do you have any goals in this area?  yes  not employed: date last employed 05/18/2023 I need assistance with the  following:  meal prep, household duties, and shopping Do you have any goals in this area?  yes  bladder control problems weakness trouble walking depression anxiety  Any changes since last visit?  no  New patient    Family History  Problem Relation Age of Onset   Migraines Mother    Hyperlipidemia Mother    Hypertension Mother    Diabetes Mellitus II Mother    Fibromyalgia Mother    Congestive Heart Failure Father    Hypertension Father    Diabetes Mellitus II Father    Sleep apnea Father    Hodgkin's lymphoma Father    Kidney disease Father    Asthma Sister    Mental illness Sister    Asthma Brother    Mental illness Brother    Diabetes Maternal Grandmother    Heart disease Maternal Grandmother    Heart disease Maternal Grandfather    Diabetes Maternal Grandfather    Diabetes Paternal Grandmother    Diabetes Paternal Grandfather    Allergies Son    Sleep apnea Son    Asperger's syndrome Son    Diabetes Son    Anxiety disorder Son    Diabetes Daughter    Sleep apnea Daughter    Thyroid cancer Daughter    Colon cancer Neg Hx    Esophageal cancer Neg Hx    Rectal cancer Neg Hx    Stomach cancer Neg Hx    Colon polyps Neg Hx    Social History   Socioeconomic History   Marital status: Single    Spouse name: Not on file   Number of children: 3   Years of education: Not on file   Highest education level: Some college, no degree  Occupational History   Not on file  Tobacco Use   Smoking status: Never    Passive exposure: Never   Smokeless tobacco: Never  Vaping Use   Vaping status: Never Used  Substance and Sexual Activity   Alcohol use: Not Currently    Comment: occ   Drug use: No   Sexual activity: Not on file    Comment: Hysterectomy  Other Topics Concern   Not on file  Social History Narrative   Pt lives with son    Pt not working    Social Drivers of Health   Tobacco Use: Low Risk (03/18/2024)   Patient History    Smoking Tobacco Use:  Never    Smokeless Tobacco Use: Never    Passive Exposure: Never  Financial Resource Strain: High Risk (11/07/2023)   Overall Financial Resource Strain (CARDIA)    Difficulty of Paying Living Expenses: Very hard  Food Insecurity: Food Insecurity Present (11/07/2023)   Epic    Worried About Programme Researcher, Broadcasting/film/video in the Last Year: Never true    Ran Out of Food in the Last Year: Often true  Transportation Needs: No Transportation Needs (11/07/2023)   Epic    Lack of Transportation (Medical): No    Lack of Transportation (Non-Medical): No  Physical Activity: Inactive (11/07/2023)   Exercise Vital Sign    Days of Exercise per Week: 0 days    Minutes of Exercise per Session: Not on file  Stress: Stress Concern Present (11/07/2023)  [employer] of Occupational Health - Occupational Stress Questionnaire    Feeling of Stress: Rather much  Social Connections: Moderately Integrated (11/07/2023)   Social Connection and Isolation Panel    Frequency of Communication with Friends and Family: More than three times a week    Frequency of Social Gatherings with Friends and Family: More than three times a week    Attends Religious Services: More than 4 times per year    Active Member of Clubs or Organizations: Yes    Attends Banker Meetings: More than 4 times per year    Marital Status: Never married  Depression (PHQ2-9): Medium Risk (11/20/2023)   Depression (PHQ2-9)    PHQ-2 Score: 9  Alcohol Screen: Not on file  Housing: High Risk (11/07/2023)   Epic    Unable to Pay for Housing in the Last Year: Yes    Number of Times Moved in the Last Year: 0    Homeless in the Last Year: No  Utilities: Not on file  Health Literacy: Not on file   Past Surgical History:  Procedure Laterality Date   ABDOMINAL HYSTERECTOMY     CHOLECYSTECTOMY N/A 06/22/2017   Procedure: LAPAROSCOPIC CHOLECYSTECTOMY;  Surgeon: Stevie Herlene Righter, MD;  Location: [facility] OR;  Service: General;  Laterality: N/A;    COLONOSCOPY     I & D EXTREMITY Right 09/14/2015   Procedure: IRRIGATION AND DEBRIDEMENT RIGHT MIDDLE FINGER;  Surgeon: Elsie Mussel, MD;  Location: [facility] OR;  Service: Orthopedics;  Laterality: Right;   TUBAL LIGATION     Past Medical History:  Diagnosis Date   Allergy    Anemia    Arthritis    Bipolar 1 disorder (HCC)    Sees Dr. Kermit, psychology,  [phone]   Carpal tunnel syndrome    Depression    Diabetes mellitus without complication [facility])    Family history of adverse reaction to anesthesia    mother & son have difficulty waking   Fatigue    Ganglion of left wrist 07/26/2018   Lung nodule seen on imaging study 09/24/2013   Peripheral vascular disease    Sleep apnea    uses cpap   There were no vitals taken for this visit.  Opioid Risk Score:   Fall Risk Score:  `1  Depression screen [facility] 2/9     11/20/2023    9:57 AM 11/07/2023    8:53 AM 08/07/2023    8:51 AM 11/14/2022    3:40 PM 10/16/2022    1:21 PM 08/28/2022    2:39 PM 08/01/2022    1:46 PM  Depression screen PHQ 2/9  Decreased Interest 0 0 0 0 0 0 0  Down, Depressed, Hopeless 1 0 0 0 0 0 0  PHQ - 2 Score 1 0 0 0 0 0 0  Altered sleeping 2 1 0 1 0 1 2  Tired, decreased energy 3 1 0 3 1 3 2   Change in appetite 0 1 0 1 2 1  0  Feeling bad or failure about yourself  0 0 0 0 0 0 0  Trouble concentrating 0 0 0 1 0 0 1  Moving slowly or fidgety/restless 3 1 0 1 0 0 1  Suicidal thoughts 0 0 0 0 0 0 0  PHQ-9 Score 9  4  0  7  3  5  6    Difficult doing work/chores Extremely dIfficult  Not difficult at all    Somewhat difficult     Data saved  with a previous flowsheet row definition    Review of Systems  Musculoskeletal:        Pain in both forearms, shoulders, hips, upper legs  All other systems reviewed and are negative.      Objective:   Physical Exam   Gen: no distress, normal appearing HEENT: oral mucosa pink and moist, NCAT Chest: normal effort, normal rate of breathing Abd: soft,  non-distended Ext: no edema Psych: pleasant, normal affect Skin: intact Neuro: Alert and awake, follows commands, cranial nerves II through XII grossly intact, normal speech and language 4/5 throughout b/l UE and LE, appears effort/pain limited Sensory exam normal for light touch and pain in all 4 limbs although altered in median distribution b/l.   No limb ataxia or cerebellar signs. No abnormal tone appreciated.  No hyperreflexia noted.   Musculoskeletal:   Examination reveals diffuse tenderness to palpation over the neck, back, periscapular muscles, upper and lower extremities.   Range of motion is painful and painful in the knees, shoulders and hips. Strength testing throughout is limited by diffuse pain.  Straight leg raise is negative for radicular pain but elicits posterior thigh tightness bilaterally.      Assessment & Plan:  -Fibromyalgia with widespread pain, fatigue, and associated symptoms. -Bilateral carpal tunnel syndrome, she is not interested in surgery -Depression, denies SI or HI -Polymyalgia rheumatica  PLAN 1.  Medication: Will attempt to titrate pregabalin  (Lyrica ). The patient will start with her current 25 mg dose, titrating up to 75 mg twice daily over the next 1-2 weeks. A new prescription for 75 mg capsules will be sent to the pharmacy.  Discussed low-dose naltrexone as a non-opioid, non-antidepressant option. The patient declined at this time due to out-of-pocket cost.  Antidepressants not options due depression and medication currently used to treat her depression.    2.  Physical Therapy: Referral placed for aquatic therapy at an outside facility ([facility]) for a gentle exercise program.  3.  Patient Education: Provided with a handout on anti-inflammatory foods (e.g., olive oil, turmeric). Discussed the potential use of a TENS unit, but the patient declined at this time due to cost. Recommended considering Tai Chi for gentle exercise. Continue follow-up  with rheumatology as directed for polymyalgia rheumatica Advised using CTS braces at night.   Follow-up: Scheduled for a follow-up visit in two months.  "

## 2024-03-24 ENCOUNTER — Other Ambulatory Visit (HOSPITAL_COMMUNITY): Payer: Self-pay

## 2024-03-24 ENCOUNTER — Other Ambulatory Visit: Payer: Self-pay

## 2024-03-25 ENCOUNTER — Encounter: Admitting: Physical Medicine & Rehabilitation

## 2024-03-26 ENCOUNTER — Other Ambulatory Visit (HOSPITAL_COMMUNITY): Payer: Self-pay

## 2024-03-27 ENCOUNTER — Encounter: Admitting: Physical Medicine & Rehabilitation

## 2024-03-29 ENCOUNTER — Other Ambulatory Visit: Payer: Self-pay

## 2024-03-29 ENCOUNTER — Other Ambulatory Visit (HOSPITAL_COMMUNITY): Payer: Self-pay

## 2024-03-29 MED ORDER — TRULICITY 0.75 MG/0.5ML ~~LOC~~ SOAJ
0.7500 mg | SUBCUTANEOUS | 3 refills | Status: AC
  Filled 2024-03-29: qty 2, 28d supply, fill #0
  Filled 2024-04-11: qty 2, 28d supply, fill #1

## 2024-04-11 ENCOUNTER — Other Ambulatory Visit (HOSPITAL_COMMUNITY): Payer: Self-pay

## 2024-04-11 ENCOUNTER — Other Ambulatory Visit: Payer: Self-pay

## 2024-04-11 ENCOUNTER — Telehealth (HOSPITAL_COMMUNITY): Payer: Self-pay

## 2024-04-11 MED ORDER — DEXCOM G7 SENSOR MISC
6 refills | Status: AC
  Filled 2024-04-11: qty 2, 20d supply, fill #0

## 2024-04-11 MED ORDER — LAMOTRIGINE 100 MG PO TABS
200.0000 mg | ORAL_TABLET | Freq: Every day | ORAL | 2 refills | Status: AC
  Filled 2024-04-11: qty 60, 30d supply, fill #0

## 2024-04-25 ENCOUNTER — Ambulatory Visit: Admitting: Pharmacist

## 2024-05-15 ENCOUNTER — Ambulatory Visit

## 2024-05-19 ENCOUNTER — Encounter: Admitting: Physical Medicine & Rehabilitation

## 2024-08-26 ENCOUNTER — Ambulatory Visit
